# Patient Record
Sex: Female | Born: 1955 | Race: Black or African American | Hispanic: No | Marital: Single | Smoking: Never smoker
Health system: Southern US, Community
[De-identification: ages and names within clinical notes are randomized; demographics above are authoritative.]

## PROBLEM LIST (undated history)

## (undated) DIAGNOSIS — Z923 Personal history of irradiation: Secondary | ICD-10-CM

## (undated) DIAGNOSIS — M1990 Unspecified osteoarthritis, unspecified site: Secondary | ICD-10-CM

## (undated) DIAGNOSIS — Z9889 Other specified postprocedural states: Secondary | ICD-10-CM

## (undated) DIAGNOSIS — C50919 Malignant neoplasm of unspecified site of unspecified female breast: Secondary | ICD-10-CM

## (undated) DIAGNOSIS — J42 Unspecified chronic bronchitis: Secondary | ICD-10-CM

## (undated) DIAGNOSIS — Z8042 Family history of malignant neoplasm of prostate: Secondary | ICD-10-CM

## (undated) DIAGNOSIS — Z803 Family history of malignant neoplasm of breast: Secondary | ICD-10-CM

## (undated) DIAGNOSIS — K219 Gastro-esophageal reflux disease without esophagitis: Secondary | ICD-10-CM

## (undated) DIAGNOSIS — Z8041 Family history of malignant neoplasm of ovary: Secondary | ICD-10-CM

## (undated) DIAGNOSIS — R112 Nausea with vomiting, unspecified: Secondary | ICD-10-CM

## (undated) DIAGNOSIS — Z9221 Personal history of antineoplastic chemotherapy: Secondary | ICD-10-CM

## (undated) DIAGNOSIS — C801 Malignant (primary) neoplasm, unspecified: Secondary | ICD-10-CM

## (undated) DIAGNOSIS — E739 Lactose intolerance, unspecified: Secondary | ICD-10-CM

## (undated) HISTORY — DX: Family history of malignant neoplasm of prostate: Z80.42

## (undated) HISTORY — DX: Lactose intolerance, unspecified: E73.9

## (undated) HISTORY — DX: Unspecified osteoarthritis, unspecified site: M19.90

## (undated) HISTORY — DX: Malignant (primary) neoplasm, unspecified: C80.1

## (undated) HISTORY — DX: Family history of malignant neoplasm of ovary: Z80.41

## (undated) HISTORY — DX: Family history of malignant neoplasm of breast: Z80.3

---
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
## 1998-07-02 ENCOUNTER — Other Ambulatory Visit: Admission: RE | Admit: 1998-07-02 | Discharge: 1998-07-02 | Payer: Self-pay | Admitting: Gynecology

## 2000-04-29 ENCOUNTER — Other Ambulatory Visit: Admission: RE | Admit: 2000-04-29 | Discharge: 2000-04-29 | Payer: Self-pay | Admitting: Gynecology

## 2001-05-06 ENCOUNTER — Encounter: Payer: Self-pay | Admitting: Emergency Medicine

## 2001-05-06 ENCOUNTER — Emergency Department (HOSPITAL_COMMUNITY): Admission: EM | Admit: 2001-05-06 | Discharge: 2001-05-06 | Payer: Self-pay | Admitting: Emergency Medicine

## 2001-07-26 ENCOUNTER — Emergency Department (HOSPITAL_COMMUNITY): Admission: EM | Admit: 2001-07-26 | Discharge: 2001-07-26 | Payer: Self-pay | Admitting: Emergency Medicine

## 2004-01-22 ENCOUNTER — Other Ambulatory Visit: Admission: RE | Admit: 2004-01-22 | Discharge: 2004-01-22 | Payer: Self-pay | Admitting: Family Medicine

## 2004-01-24 ENCOUNTER — Ambulatory Visit (HOSPITAL_COMMUNITY): Admission: RE | Admit: 2004-01-24 | Discharge: 2004-01-24 | Payer: Self-pay | Admitting: Family Medicine

## 2004-03-24 HISTORY — PX: ROTATOR CUFF REPAIR: SHX139

## 2004-07-24 ENCOUNTER — Encounter: Admission: RE | Admit: 2004-07-24 | Discharge: 2004-07-24 | Payer: Self-pay | Admitting: Family Medicine

## 2005-02-17 ENCOUNTER — Ambulatory Visit (HOSPITAL_COMMUNITY): Admission: RE | Admit: 2005-02-17 | Discharge: 2005-02-18 | Payer: Self-pay | Admitting: Orthopedic Surgery

## 2006-02-25 ENCOUNTER — Other Ambulatory Visit: Admission: RE | Admit: 2006-02-25 | Discharge: 2006-02-25 | Payer: Self-pay | Admitting: Family Medicine

## 2008-02-14 ENCOUNTER — Other Ambulatory Visit: Admission: RE | Admit: 2008-02-14 | Discharge: 2008-02-14 | Payer: Self-pay | Admitting: Family Medicine

## 2008-03-24 HISTORY — PX: TOTAL ABDOMINAL HYSTERECTOMY: SHX209

## 2008-07-17 ENCOUNTER — Encounter (INDEPENDENT_AMBULATORY_CARE_PROVIDER_SITE_OTHER): Payer: Self-pay | Admitting: Obstetrics and Gynecology

## 2008-07-17 ENCOUNTER — Inpatient Hospital Stay (HOSPITAL_COMMUNITY): Admission: RE | Admit: 2008-07-17 | Discharge: 2008-07-19 | Payer: Self-pay | Admitting: Obstetrics and Gynecology

## 2010-04-01 ENCOUNTER — Ambulatory Visit (HOSPITAL_BASED_OUTPATIENT_CLINIC_OR_DEPARTMENT_OTHER)
Admission: RE | Admit: 2010-04-01 | Discharge: 2010-04-01 | Payer: Self-pay | Source: Home / Self Care | Attending: Obstetrics and Gynecology | Admitting: Obstetrics and Gynecology

## 2010-07-03 LAB — CBC
HCT: 34.8 % — ABNORMAL LOW (ref 36.0–46.0)
Hemoglobin: 12 g/dL (ref 12.0–15.0)
MCHC: 34.4 g/dL (ref 30.0–36.0)
MCHC: 34.4 g/dL (ref 30.0–36.0)
MCV: 94.8 fL (ref 78.0–100.0)
Platelets: 145 10*3/uL — ABNORMAL LOW (ref 150–400)
RBC: 3.7 MIL/uL — ABNORMAL LOW (ref 3.87–5.11)
RDW: 14 % (ref 11.5–15.5)

## 2010-07-03 LAB — COMPREHENSIVE METABOLIC PANEL
ALT: 29 U/L (ref 0–35)
Alkaline Phosphatase: 59 U/L (ref 39–117)
CO2: 24 mEq/L (ref 19–32)
GFR calc non Af Amer: >60 mL/min (ref >60)
Glucose, Bld: 87 mg/dL (ref 70–99)
Potassium: 3.8 mEq/L (ref 3.5–5.1)
Sodium: 133 mEq/L — ABNORMAL LOW (ref 135–145)

## 2010-07-03 LAB — TYPE AND SCREEN: ABO/RH(D): A POS

## 2010-08-06 NOTE — Op Note (Signed)
Christy Serrano, Christy Serrano              ACCOUNT NO.:  [id number]   MEDICAL RECORD NO.:  [id number]          PATIENT TYPE:  INP   LOCATION:  9399                          FACILITY:  [facility]   PHYSICIAN:  Charles A. Delcambre, MDDATE OF BIRTH:  1955/12/21   DATE OF PROCEDURE:  07/17/2008  DATE OF DISCHARGE:                               OPERATIVE REPORT   PREOPERATIVE DIAGNOSES:  1. Menorrhagia.  2. Pelvic pain.  3. 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   POSTOPERATIVE DIAGNOSES:  1. Menorrhagia.  2. Pelvic pain.  3. 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   PROCEDURE:  Transabdominal hysterectomy, bilateral salpingo-  oophorectomy.   SURGEON:  Charles A. Sydnee Cabal, MD   ASSISTANT:  Gerald Leitz, MD   ANESTHESIA:  General by the endotracheal route.   OPERATIVE FINDINGS:  22-week uterus with leiomyomata multiple, normal  ovaries.   SPECIMENS:  Uterus, tubes, and ovaries to Pathology.   ESTIMATED BLOOD LOSS:  300 mL.   COMPLICATIONS:  None.   Instrument, sponge, and needle count correct x2.   DESCRIPTION OF PROCEDURE:  The patient was taken to the operating room,  placed in supine position.  A sterile prep and drape was undertaken.  Vertical skin incision was made with a knife, carried down to fascia.  Fascia was incised with a knife and Mayo scissors.  Rectus muscles were  sharply dissected with Metzenbaum scissors and blunt dissection.  Peritoneum was entered with a hemostat without damage to the bowel,  bladder, or vascular structures.  Metzenbaum scissors were used to take  the incision superiorly and inferiorly without damage to the bladder.  Traction was used to extend the incision.  The uterus was with some  difficulty lifted out of the abdomen through this incision but was done  safely.  Kelly clamps were placed on the cornual regions.  Round  ligaments were transected bilaterally and held.  Ureters were seen  distal and away from the infundibulopelvic pedicles.  Finger was passed  through  the broad ligament.  Infundibulopelvic pedicle was isolated and  clamped, cut, free tied, and then transfixion stitched with 0 Vicryl.  Hemostasis was excellent bilaterally.  Bladder was taken down anteriorly  with Metzenbaum scissors and some blunt dissection.  The uterine vessels  were skeletonized.  Uterine vessels were taken on either side with  transfixion stitches.  Hemostasis was excellent and taken to the right  uterine artery.  The superior artery was backbleeding quite briskly.  This was separated with figure-of-eight suture with good results.  Ureter had been well seen.  infundibulopelvic pedicle was taken on the  right side then as noted above, freed, tied, and then transfixion  stitch.  Hemostasis was good.  Once bladder was down, the uterine  vessels were skeletonized.  The uterine vessels were taken and  transfixion stitched.  Hemostasis was excellent.  Second pedicle taken  from the remainder of the cardinal ligaments down either side was taken  and transfixion stitched.  The final pedicle was taken across the  uterosacral ligaments in the vaginal angle.  The uterus was cut free.  The suture was transfixion stitched.  Pedicles were  transfixion stitched  and held on either side.  Kochers were placed on all corners.  Richardson angle sutures were placed with 0 Vicryl on either side.  Zero  Vicryl was then placed running locking suture across the cuff with good  hemostasis.  Irrigation was carried out.  Minor electrocautery on the  bladder edge was done for hemostasis.  Cuff as well as all pedicles  revealed excellent hemostasis.  Irrigation was carried out once again  and all sutures and packs were removed.  Self-retaining retractor had  not been used in this procedure.  We had good exposure noted without it.  #1 PDS was used in a running nonlocking suture to close in an on bloc  fashion the abdominal incision with peritoneum and fascia.  The  subcutaneous layer was of  excellent hemostasis after minor  electrocautery.  Skin staples were then used to close the skin.  The  patient tolerated procedure well and was taken to recovery with  physician in attendance.  Uterus weighed 1248 g.      Charles A. Sydnee Cabal, MD  Electronically Signed     CAD/MEDQ  D:  07/17/2008  T:  07/18/2008  Job:  [id number]

## 2010-08-06 NOTE — Discharge Summary (Signed)
NAMECAYENNE, BREAULT              ACCOUNT NO.:  [id number]   MEDICAL RECORD NO.:  [id number]          PATIENT TYPE:  INP   LOCATION:  9320                          FACILITY:  [facility]   PHYSICIAN:  Charles A. Delcambre, MDDATE OF BIRTH:  05/09/1955   DATE OF ADMISSION:  07/17/2008  DATE OF DISCHARGE:  07/19/2008                               DISCHARGE SUMMARY   Instructions to return.   DISPOSITION:  Discharged home this day.  She has a routine course today,  has flatus and continues doing well.   She is instructed no lifting greater than 20 pounds for about 4 weeks.  No driving for 2 weeks.  Shower okay for 2 weeks.  Ambulate and stairs  are okay.  Do not drive a car for 2 weeks.  Prescription of Vicodin one  to two p.o. q.4 h. p.r.n. #40, Motrin 800 mg one p.o. q.8 h. p.r.n. #30  refill x1; and she has over-the-counter iron at home.  She is instructed  to take this once a day.  She will return to clinic in 48 hours to get  staples discontinued and see me again 2 weeks thereafter.   LABORATORY DATA:  Postoperative hematocrit 29.6, hemoglobin 10.2.  Preop  hematocrit 34.8, hemoglobin 12.0.  Pathology is pending.  Uterus, tubes,  and ovaries to Pathology consistent with approximately 22 weeks' size  uterus.   HISTORY AND PHYSICAL:  Dictated on the chart.   HOSPITAL COURSE:  The patient was admitted, underwent surgery as noted  above.  Postoperatively, she had routine postoperative course.  PCA was  discontinued postop day #1.  She was tolerating pain well with p.o.  Vicodin and Motrin.  She continued to do well and was advanced to  general diet without nausea on postop day #1, expect her to return on  postop day #2 and thereafter, the patient will be discharged home  without any complications.      Charles A. Sydnee Cabal, MD  Electronically Signed     CAD/MEDQ  D:  07/19/2008  T:  07/19/2008  Job:  [id number]

## 2010-08-09 NOTE — Op Note (Signed)
Christy Serrano, Christy Serrano              ACCOUNT NO.:  [id number]   MEDICAL RECORD NO.:  [id number]          PATIENT TYPE:  AMB   LOCATION:  DAY                          FACILITY:  [facility]   PHYSICIAN:  Marlowe Kays, M.D.  DATE OF BIRTH:  14-Nov-1955   DATE OF PROCEDURE:  02/17/2005  DATE OF DISCHARGE:                                 OPERATIVE REPORT   PREOPERATIVE DIAGNOSES:  1.  Torn retracted rotator cuff tear.  2.  Degenerative arthritis, acromioclavicular joint, right shoulder.   POSTOPERATIVE DIAGNOSES:  1.  Torn retracted rotator cuff tear.  2.  Degenerative arthritis, acromioclavicular joint, right shoulder.   OPERATION:  1.  Anterior acromionectomy and repair of torn rotator cuff.  2.  Distal clavicle excision, right shoulder.   SURGEON:  Marlowe Kays, M.D.   ASSISTANTDruscilla Brownie. Cherlynn June.   ANESTHESIA:  General.   INDICATION FOR PROCEDURE:  Painful right shoulder with MRI demonstrating  both the retracted tear and moderately severe osteoarthritis with osteophyte  at the [facility] joint. Even though her plain film looked reasonably good in this  regard, the MRI did show changes and it was felt that distal clavicle  excision was indicated. She had a significant impingement problem and this  was contributing to it as well as a downsloping lateral acromion.   PROCEDURE:  Prophylactic antibiotics, satisfactory general anesthesia, beach-  chair position with __________ frame, right shoulder girdle was prepped  DuraPrep and draped in a sterile field. I marked out an incision for the  distal clavicle excision and then curved it downward vertically in the usual  rotator cuff repair fashion. Using subperiosteal dissection, I exposed the  AC joint and measured off a centimeter and a half medially. I undermined the  clavicle at this point and placed baby Homan's underneath it and used a  micro saw to remove the distal clavicle 1.5 cm.  There were no remaining  spicules of bone  noted. Bone wax was applied to the raw bone.  I then  continued dissection lateral-ward with subperiosteal dissection exposing the  anterior acromion, which I undermined with a small Cobb elevator. Joint  fluid came forth confirming the tear. I then marked off my initial anterior  acromionectomy incision and, protecting the underlying rotator cuff with  larger Cobb elevator, made my first bone resection. She still had a  significant impingement which I corrected by making several other passes  with a micro saw and also used a small rongeur until the impingement had  been completely relieved. The rotator cuff tear was quite obvious and was at  the level of the greater tuberosity between it and the long head of biceps  tendon and measured about a centimeter and a half in width with about 2 cm  of retraction.  A portion of the rotator cuff had flipped back on itself as  well, so it was getting trapped underneath the anterior acromion.  The long  head of the biceps tendon was noted to be intact. After analyzing the tear  with the arm slightly abducted on the Mayo stand, I then began  the repair. I  started out by using two rotator cuff anchor sutures, spreading the four  wings through the tear, which basically had two halves.  With lateral  traction, I then repaired the apex of the V with multiple #1 Ethibond used  from the suture arms. I then tied down the two wings of the anchors. There  still was redundant tissue laterally which I felt would be best stabilized.  Since she had no soft tissue, I used a third anchor forming essentially an  equilateral triangle with this one at the tip and placed these sutures  weaving them through the two previous flaps and suturing these flaps down. I  then had some minimal flap remaining and I was able to use some #1 Vicryl  with some very shallow sutures through the surrounding soft tissue. This  gave a nice smooth repair. No residual impingement.  The wound  was irrigated  well with sterile saline and infiltrated with 0.5% Marcaine with adrenalin.  I placed Gelfoam in the resection space of the distal clavicle. We then  closed the wound with interrupted #1 Vicryl and the fascia over the distal  clavicle and acromion, and in the very small split in the anterior deltoid.  This gave a nice tight closure. I followed this with 2-0 Vicryl subcutaneous  tissue deep, 4-0 superficially, Steri-Strips in the skin.  A dry sterile  dressing and short immobilizer were applied. She tolerated the procedure  well at the time of this dictation and was on her way to recovery in  satisfactory condition without known complications.           ______________________________  Marlowe Kays, M.D.     JA/MEDQ  D:  02/17/2005  T:  02/17/2005  Job:  [id number]

## 2011-07-17 ENCOUNTER — Other Ambulatory Visit (HOSPITAL_BASED_OUTPATIENT_CLINIC_OR_DEPARTMENT_OTHER): Payer: Self-pay | Admitting: Obstetrics & Gynecology

## 2011-07-17 DIAGNOSIS — Z1231 Encounter for screening mammogram for malignant neoplasm of breast: Secondary | ICD-10-CM

## 2011-07-23 ENCOUNTER — Ambulatory Visit (HOSPITAL_BASED_OUTPATIENT_CLINIC_OR_DEPARTMENT_OTHER)
Admission: RE | Admit: 2011-07-23 | Discharge: 2011-07-23 | Disposition: A | Discharge status: Home / Self Care | Payer: 59 | Source: Ambulatory Visit | Attending: Obstetrics & Gynecology | Admitting: Obstetrics & Gynecology

## 2011-07-23 DIAGNOSIS — Z1231 Encounter for screening mammogram for malignant neoplasm of breast: Secondary | ICD-10-CM | POA: Insufficient documentation

## 2012-09-12 ENCOUNTER — Emergency Department (HOSPITAL_BASED_OUTPATIENT_CLINIC_OR_DEPARTMENT_OTHER)
Admission: EM | Admit: 2012-09-12 | Discharge: 2012-09-12 | Disposition: A | Discharge status: Home / Self Care | Payer: 59 | Attending: Emergency Medicine | Admitting: Emergency Medicine

## 2012-09-12 ENCOUNTER — Encounter (HOSPITAL_BASED_OUTPATIENT_CLINIC_OR_DEPARTMENT_OTHER): Payer: Self-pay | Admitting: *Deleted

## 2012-09-12 DIAGNOSIS — R220 Localized swelling, mass and lump, head: Secondary | ICD-10-CM

## 2012-09-12 DIAGNOSIS — K089 Disorder of teeth and supporting structures, unspecified: Secondary | ICD-10-CM | POA: Insufficient documentation

## 2012-09-12 MED ORDER — NAPROXEN 375 MG PO TABS: 375.0000 mg | ORAL_TABLET | Freq: Two times a day (BID) | ORAL | Status: DC

## 2012-09-12 MED ORDER — CLINDAMYCIN HCL 150 MG PO CAPS: 150.0000 mg | ORAL_CAPSULE | Freq: Four times a day (QID) | ORAL | Status: DC

## 2012-09-12 NOTE — ED Notes (Signed)
Had a tooth filling done a few days ago and now her face, right side, is swollen and sore.

## 2012-09-12 NOTE — ED Provider Notes (Signed)
History     CSN: [id number]  Arrival date & time 09/12/12  1059   First MD Initiated Contact with Patient 09/12/12 1214      Chief Complaint  Patient presents with  . Facial Swelling    (Consider location/radiation/quality/duration/timing/severity/associated sxs/prior treatment) Patient is a 57 y.o. female presenting with tooth pain. The history is provided by the patient. No language interpreter was used.  Dental Pain Location:  Upper Quality:  Aching Severity:  Moderate Onset quality:  Gradual Timing:  Constant Progression:  Worsening Chronicity:  New Relieved by:  Nothing Ineffective treatments:  None tried Pt complains of swelling to her upper face.  Pt had a tooth filled on wednesday  History reviewed. No pertinent past medical history.  History reviewed. No pertinent past surgical history.  No family history on file.  History  Substance Use Topics  . Smoking status: Never Smoker   . Smokeless tobacco: Not on file  . Alcohol Use: No    OB History   Grav Para Term Preterm Abortions TAB SAB Ect Mult Living                  Review of Systems  All other systems reviewed and are negative.    Allergies  Review of patient's allergies indicates not on file.  Home Medications   Current Outpatient Rx  Name  Route  Sig  Dispense  Refill  . estradiol (VIVELLE-DOT) 0.025 MG/24HR   Transdermal   Place 1 patch onto the skin 2 (two) times a week.         . naproxen (NAPROSYN) 250 MG tablet   Oral   Take 250 mg by mouth 2 (two) times daily with a meal.           BP 132/72  Pulse 78  Temp(Src) 98.6 F (37 C) (Oral)  Resp 18  Wt 187 lb (84.823 kg)  SpO2 99%  Physical Exam  Nursing note and vitals reviewed. Constitutional: She is oriented to person, place, and time. She appears well-developed.  HENT:  Head: Normocephalic and atraumatic.  Mouth/Throat: Oropharynx is clear and moist.  Filling no gum swelling,   Slight swelling face  Eyes:  Conjunctivae are normal. Pupils are equal, round, and reactive to light.  Musculoskeletal: Normal range of motion.  Neurological: She is alert and oriented to person, place, and time. She has normal reflexes.  Skin: Skin is warm.  Psychiatric: She has a normal mood and affect.    ED Course  Procedures (including critical care time)  Labs Reviewed - No data to display No results found.   1. Right facial swelling       MDM  I will treat with clindamycin and naprosyn.   Pt advised to follow up with her dentist for recheck.   Call tomorrow for appointment       Elson Areas, PA-C 09/12/12 2155

## 2012-09-13 NOTE — ED Provider Notes (Signed)
Medical screening examination/treatment/procedure(s) were performed by non-physician practitioner and as supervising physician I was immediately available for consultation/collaboration.   Sherryll Skoczylas Y. Adeena Bernabe, MD 09/13/12 0854 

## 2014-06-22 ENCOUNTER — Other Ambulatory Visit (HOSPITAL_BASED_OUTPATIENT_CLINIC_OR_DEPARTMENT_OTHER): Payer: Self-pay | Admitting: Obstetrics and Gynecology

## 2014-06-22 DIAGNOSIS — Z1231 Encounter for screening mammogram for malignant neoplasm of breast: Secondary | ICD-10-CM

## 2014-07-10 ENCOUNTER — Ambulatory Visit (HOSPITAL_BASED_OUTPATIENT_CLINIC_OR_DEPARTMENT_OTHER)
Admission: RE | Admit: 2014-07-10 | Discharge: 2014-07-10 | Disposition: A | Discharge status: Home / Self Care | Payer: 59 | Source: Ambulatory Visit | Attending: Obstetrics and Gynecology | Admitting: Obstetrics and Gynecology

## 2014-07-10 DIAGNOSIS — Z1231 Encounter for screening mammogram for malignant neoplasm of breast: Secondary | ICD-10-CM | POA: Diagnosis present

## 2015-06-01 MED FILL — METHYLPREDNISOLONE 4 MG TAB: 4 | 6 days supply | Qty: 21 | Fill #0

## 2015-06-28 MED FILL — FLUCONAZOLE 200 MG TABLET: 200 | 14 days supply | Qty: 7 | Fill #0

## 2015-07-24 ENCOUNTER — Other Ambulatory Visit (HOSPITAL_BASED_OUTPATIENT_CLINIC_OR_DEPARTMENT_OTHER): Payer: Self-pay | Admitting: Obstetrics and Gynecology

## 2015-07-24 DIAGNOSIS — Z1231 Encounter for screening mammogram for malignant neoplasm of breast: Secondary | ICD-10-CM

## 2015-07-26 ENCOUNTER — Ambulatory Visit (HOSPITAL_BASED_OUTPATIENT_CLINIC_OR_DEPARTMENT_OTHER)
Admission: RE | Admit: 2015-07-26 | Discharge: 2015-07-26 | Disposition: A | Discharge status: Home / Self Care | Payer: 59 | Source: Ambulatory Visit | Attending: Obstetrics and Gynecology | Admitting: Obstetrics and Gynecology

## 2015-07-26 DIAGNOSIS — Z1231 Encounter for screening mammogram for malignant neoplasm of breast: Secondary | ICD-10-CM | POA: Diagnosis not present

## 2015-10-30 MED FILL — VIVELLE-DOT 0.075 MG PATCH: 0.075 | 28 days supply | Qty: 8 | Fill #0

## 2015-11-29 MED FILL — VIVELLE-DOT 0.075 MG PATCH: 0.075 | 28 days supply | Qty: 8 | Fill #1

## 2015-12-28 MED FILL — VIVELLE-DOT 0.075 MG PATCH: 0.075 | 28 days supply | Qty: 8 | Fill #2

## 2016-01-25 MED FILL — VIVELLE-DOT 0.075 MG PATCH: 0.075 | 28 days supply | Qty: 8 | Fill #0

## 2016-02-01 MED FILL — BENZONATATE 100 MG CAPSULE: 100 | 5 days supply | Qty: 30 | Fill #0

## 2016-02-13 MED FILL — CEFDINIR 300 MG CAPSULE: 300 | 14 days supply | Qty: 14 | Fill #0

## 2016-02-26 MED FILL — VENTOLIN HFA 90 MCG INHALER: 108 (90 BAS | 17 days supply | Qty: 18 | Fill #0

## 2016-05-23 MED FILL — ESTRADIOL 0.075 MG PATCH: 0.075 | 28 days supply | Qty: 8 | Fill #0

## 2016-06-23 MED FILL — METHYLPREDNISOLONE 4 MG TAB: 4 | 6 days supply | Qty: 21 | Fill #0

## 2016-08-22 MED FILL — PROCTO-MED HC 2.5% CREAM: 2.5 | 20 days supply | Qty: 30 | Fill #0

## 2016-10-02 ENCOUNTER — Other Ambulatory Visit (HOSPITAL_BASED_OUTPATIENT_CLINIC_OR_DEPARTMENT_OTHER): Payer: Self-pay | Admitting: Internal Medicine

## 2016-10-02 DIAGNOSIS — Z1231 Encounter for screening mammogram for malignant neoplasm of breast: Secondary | ICD-10-CM

## 2016-10-24 MED FILL — FLUCONAZOLE 150 MG TABLET: 150 | 3 days supply | Qty: 3 | Fill #0

## 2016-10-24 MED FILL — METHYLPREDNISOLONE 4 MG TAB: 4 | 6 days supply | Qty: 21 | Fill #0

## 2016-11-04 ENCOUNTER — Other Ambulatory Visit (HOSPITAL_BASED_OUTPATIENT_CLINIC_OR_DEPARTMENT_OTHER): Payer: Self-pay | Admitting: Internal Medicine

## 2016-11-04 DIAGNOSIS — Z0000 Encounter for general adult medical examination without abnormal findings: Secondary | ICD-10-CM

## 2016-11-27 ENCOUNTER — Ambulatory Visit (HOSPITAL_BASED_OUTPATIENT_CLINIC_OR_DEPARTMENT_OTHER)
Admission: RE | Admit: 2016-11-27 | Discharge: 2016-11-27 | Disposition: A | Discharge status: Home / Self Care | Payer: 59 | Source: Ambulatory Visit | Attending: Internal Medicine | Admitting: Internal Medicine

## 2016-11-27 DIAGNOSIS — Z0000 Encounter for general adult medical examination without abnormal findings: Secondary | ICD-10-CM

## 2016-11-27 DIAGNOSIS — Z1231 Encounter for screening mammogram for malignant neoplasm of breast: Secondary | ICD-10-CM | POA: Insufficient documentation

## 2017-02-10 MED FILL — metroNIDAZOLE 0.75 % GEL: 0.75 | 5 days supply | Qty: 70 | Fill #0

## 2017-03-20 MED FILL — metroNIDAZOLE 0.75 % GEL: 0.75 | 5 days supply | Qty: 70 | Fill #0

## 2017-11-04 MED FILL — CYCLOBENZAPRINE HCL 10 MG T: 10 | 7 days supply | Qty: 7 | Fill #0

## 2017-11-12 MED FILL — VENTOLIN HFA 90 MCG INHALER: 108 (90 BAS | 17 days supply | Qty: 18 | Fill #0

## 2017-11-12 MED FILL — AZITHROMYCIN 250 MG TABLET: 250 | 5 days supply | Qty: 6 | Fill #0

## 2017-12-08 ENCOUNTER — Encounter (INDEPENDENT_AMBULATORY_CARE_PROVIDER_SITE_OTHER): Payer: Self-pay

## 2017-12-18 ENCOUNTER — Other Ambulatory Visit (HOSPITAL_BASED_OUTPATIENT_CLINIC_OR_DEPARTMENT_OTHER): Payer: Self-pay | Admitting: Obstetrics and Gynecology

## 2017-12-18 DIAGNOSIS — Z1231 Encounter for screening mammogram for malignant neoplasm of breast: Secondary | ICD-10-CM

## 2017-12-22 ENCOUNTER — Encounter (INDEPENDENT_AMBULATORY_CARE_PROVIDER_SITE_OTHER): Payer: Self-pay | Admitting: Bariatrics

## 2017-12-22 ENCOUNTER — Ambulatory Visit (INDEPENDENT_AMBULATORY_CARE_PROVIDER_SITE_OTHER): Payer: Managed Care, Other (non HMO) | Admitting: Bariatrics

## 2017-12-22 VITALS — BP 134/75 | HR 61 | Temp 97.6°F | Ht 65.0 in | Wt 195.0 lb

## 2017-12-22 DIAGNOSIS — Z9189 Other specified personal risk factors, not elsewhere classified: Secondary | ICD-10-CM

## 2017-12-22 DIAGNOSIS — Z6832 Body mass index (BMI) 32.0-32.9, adult: Secondary | ICD-10-CM

## 2017-12-22 DIAGNOSIS — E559 Vitamin D deficiency, unspecified: Secondary | ICD-10-CM | POA: Diagnosis not present

## 2017-12-22 DIAGNOSIS — Z0289 Encounter for other administrative examinations: Secondary | ICD-10-CM

## 2017-12-22 DIAGNOSIS — Z1331 Encounter for screening for depression: Secondary | ICD-10-CM

## 2017-12-22 DIAGNOSIS — J45909 Unspecified asthma, uncomplicated: Secondary | ICD-10-CM | POA: Diagnosis not present

## 2017-12-22 DIAGNOSIS — R5383 Other fatigue: Secondary | ICD-10-CM

## 2017-12-22 DIAGNOSIS — E669 Obesity, unspecified: Secondary | ICD-10-CM

## 2017-12-22 NOTE — Progress Notes (Signed)
.  Office: [phone]  /  Fax: [phone]   HPI:   Chief Complaint: OBESITY  Christy Serrano (MR# [id number]) is a 62 y.o. female who presents on 12/22/2017 for obesity evaluation and treatment. Current BMI is Body mass index is 32.45 kg/m.Christy Serrano has struggled with obesity for years and has been unsuccessful in either losing weight or maintaining long term weight loss. Christy Serrano has occasional increased portion size and she increases her carbohydrates at times. Christy Serrano attended our information session and states she is currently in the action stage of change and ready to dedicate time achieving and maintaining a healthier weight.  Christy Serrano states her family eats meals together she thinks her family will eat healthier with  her desired weight loss is 36 to 41 lbs she started gaining weight at menopause her heaviest weight ever was 196 lbs. she has significant food cravings issues  she snacks frequently in the evenings she skips meals frequently she is frequently drinking liquids with calories she frequently makes poor food choices she frequently eats larger portions than normal  she struggles with emotional eating    Fatigue Christy Serrano feels her energy is lower than it should be. This has worsened with weight gain and has not worsened recently. Matalie admits to daytime somnolence and admits to waking up still tired. Patient is at risk for obstructive sleep apnea. Patent has a history of symptoms of daytime fatigue and morning fatigue. Patient generally gets 6 hours of sleep per night, and states they generally have restful sleep. Snoring is present. Apneic episodes are not present. Epworth Sleepiness Score is 9  Dyspnea on exertion Christy Serrano notes increasing shortness of breath with exercising and seems to be worsening over time with weight gain. She notes getting out of breath sooner with activity than she used to. This has not gotten worse recently. Christy Serrano denies  orthopnea.  Vitamin D deficiency Christy Serrano has a diagnosis of vitamin D deficiency. She is currently taking OTC vit D and denies nausea, vomiting or muscle weakness.  At risk for osteopenia and osteoporosis Christy Serrano is at higher risk of osteopenia and osteoporosis due to vitamin D deficiency.   Reactive Airway Disease Christy Serrano has a diagnosis of reactive airway disease and she uses ventolin occasionally with the common cold and episodes of bronchitis.  Depression Screen Christy Serrano (modified PHQ-9) score was  Depression screen PHQ 2/9 12/22/2017  Decreased Interest 0  Down, Depressed, Hopeless 0  PHQ - 2 Score 0  Altered sleeping 0  Tired, decreased energy 0  Change in appetite 1  Feeling bad or failure about yourself  0  Trouble concentrating 0  Moving slowly or fidgety/restless 0  Suicidal thoughts 0  PHQ-9 Score 1    ALLERGIES: Not on File  MEDICATIONS: Current Outpatient Medications on File Prior to Visit  Medication Sig Dispense Refill  . albuterol (PROVENTIL HFA;VENTOLIN HFA) 108 (90 Base) MCG/ACT inhaler Inhale into the lungs every 6 (six) hours as needed for wheezing or shortness of breath.    Marland Kitchen aspirin 81 MG chewable tablet Chew by mouth daily.    . Calcium-Phosphorus-Vitamin D (CITRACAL +D3 PO) Take 1,200 mcg by mouth.    . cholecalciferol (VITAMIN D) 1000 units tablet Take 1,000 Units by mouth daily.    Marland Kitchen estradiol (VIVELLE-DOT) 0.025 MG/24HR Place 1 patch onto the skin 2 (two) times a week.    Marland Kitchen ibuprofen (ADVIL,MOTRIN) 200 MG tablet Take 200 mg by mouth every 6 (six) hours as needed.    Marland Kitchen  Multiple Vitamin (MULTIVITAMIN) capsule Take 1 capsule by mouth daily.     No current facility-administered medications on file prior to visit.     PAST MEDICAL HISTORY: Past Medical History:  Diagnosis Date  . Lactose intolerance     PAST SURGICAL HISTORY: Past Surgical History:  Procedure Laterality Date  . HYSTEROTOMY    . ROTATOR CUFF REPAIR       SOCIAL HISTORY: Social History   Tobacco Use  . Smoking status: Never Smoker  . Smokeless tobacco: Never Used  Substance Use Topics  . Alcohol use: No  . Drug use: No    FAMILY HISTORY: Family History  Problem Relation Age of Onset  . High blood pressure Mother   . High blood pressure Father   . Cancer Father     ROS: Review of Systems  Constitutional: Positive for malaise/fatigue.  HENT: Positive for congestion (nasal stuffiness) and tinnitus.   Eyes:       + Wear Glasses + Floaters  Respiratory: Positive for shortness of breath (on exertion).   Cardiovascular: Negative for orthopnea.  Gastrointestinal: Negative for nausea and vomiting.  Musculoskeletal:       Negative for muscle weakness    PHYSICAL EXAM: Blood pressure 134/75, pulse 61, temperature 97.6 F (36.4 C), temperature source Oral, height 5' 5"  (1.651 m), weight 195 lb (88.5 kg), SpO2 100 %. Body mass index is 32.45 kg/m. Physical Exam  Constitutional: She is oriented to person, place, and time. She appears well-developed and well-nourished.  HENT:  Head: Normocephalic and atraumatic.  Nose: Nose normal.  Eyes: EOM are normal. No scleral icterus.  Neck: Normal range of motion. Neck supple. No thyromegaly present.  Cardiovascular: Normal rate and regular rhythm.  Pulmonary/Chest: Effort normal. No respiratory distress.  Abdominal: Soft. There is no tenderness.  + Obesity  Musculoskeletal: Normal range of motion.  Range of Motion normal in all 4 extremities   Neurological: She is alert and oriented to person, place, and time. Coordination normal.  Skin: Skin is warm and dry.  Psychiatric: She has a normal Serrano and affect. Her behavior is normal.  Vitals reviewed.   RECENT LABS AND TESTS: BMET    Component Value Date/Time   NA 133 (L) 07/17/2008 0811   K 3.8 07/17/2008 0811   CL 101 07/17/2008 0811   CO2 24 07/17/2008 0811   GLUCOSE 87 07/17/2008 0811   BUN 11 07/17/2008 0811    CREATININE 0.82 07/17/2008 0811   CALCIUM 8.9 07/17/2008 0811   GFRNONAA >60 07/17/2008 0811   GFRAA  07/17/2008 0811    >60        The eGFR has been calculated using the MDRD equation. This calculation has not been validated in all clinical situations. eGFR's persistently <60 mL/min signify possible Chronic Kidney Disease.   No results found for: HGBA1C No results found for: INSULIN CBC    Component Value Date/Time   WBC 10.1 07/18/2008 0525   RBC 3.13 (L) 07/18/2008 0525   HGB 10.2 (L) 07/18/2008 0525   HCT 29.6 (L) 07/18/2008 0525   PLT 145 (L) 07/18/2008 0525   MCV 94.8 07/18/2008 0525   MCHC 34.4 07/18/2008 0525   RDW 14.0 07/18/2008 0525   Iron/TIBC/Ferritin/ %Sat No results found for: IRON, TIBC, FERRITIN, IRONPCTSAT Lipid Panel  No results found for: CHOL, TRIG, HDL, CHOLHDL, VLDL, LDLCALC, LDLDIRECT Hepatic Function Panel     Component Value Date/Time   PROT 7.2 07/17/2008 0811   ALBUMIN 4.0 07/17/2008 0811  AST 31 07/17/2008 0811   ALT 29 07/17/2008 0811   ALKPHOS 59 07/17/2008 0811   BILITOT 0.8 07/17/2008 0811   No results found for: TSH   Vitamin D There are no recent lab results  ECG  shows NSR with a rate of 71 BPM INDIRECT CALORIMETER done today shows a VO2 of 260 and a REE of 1812. Her calculated basal metabolic rate is 1478 thus her basal metabolic rate is better than expected.    ASSESSMENT AND PLAN: Other fatigue - Plan: EKG 12-Lead, Comprehensive metabolic panel, Hemoglobin A1c, Insulin, random, Lipid Panel With LDL/HDL Ratio, T3, T4, free, TSH, CBC With Differential, Vitamin B12, Folate  Reactive airway disease without complication, unspecified asthma severity, unspecified whether persistent  Vitamin D deficiency - Plan: VITAMIN D 25 Hydroxy (Vit-D Deficiency, Fractures)  Depression screening  At risk for osteoporosis  Class 1 obesity with serious comorbidity and body mass index (BMI) of 32.0 to 32.9 in adult, unspecified obesity  type  PLAN:  Fatigue Zarayah was informed that her fatigue may be related to obesity, depression or many other causes. Labs will be ordered, and in the meanwhile Ardell has agreed to work on diet, exercise and weight loss to help with fatigue. Proper sleep hygiene was discussed including the need for 7-8 hours of quality sleep each night. A sleep study was not ordered based on symptoms and Epworth score.  Dyspnea on exertion Tashianna's shortness of breath appears to be obesity related and exercise induced. She has agreed to work on weight loss and gradually increase exercise to treat her exercise induced shortness of breath. If Dody follows our instructions and loses weight without improvement of her shortness of breath, we will plan to refer to pulmonology. We will monitor this condition regularly. Christy Serrano agrees to this plan.  Vitamin D Deficiency Christy Serrano was informed that low vitamin D levels contributes to fatigue and are associated with obesity, breast, and colon cancer. She agrees to continue to take OTC Vit D '@1'$ ,000 IU daily and will follow up for routine testing of vitamin D, at least 2-3 times per year. She was informed of the risk of over-replacement of vitamin D and agrees to not increase her dose unless she discusses this with [place] first. We will check vitamin D level today and Christy Serrano will follow up as directed.  At risk for osteopenia and osteoporosis Christy Serrano was given extended  (15 minutes) osteoporosis prevention counseling today. Christy Serrano is at risk for osteopenia and osteoporosis due to her vitamin D deficiency. She was encouraged to take her vitamin D and follow her higher calcium diet and increase strengthening exercise to help strengthen her bones and decrease her risk of osteopenia and osteoporosis.  Reactive Airway Disease Christy Serrano will continue to use her inhaler as needed. She will begin exercise slowly and will increase over time. Brittnae will follow up with our  clinic in 2 weeks.  Depression Screen Christy Serrano had a negative depression screening. Depression is commonly associated with obesity and often results in emotional eating behaviors. We will monitor this closely and work on CBT to help improve the non-hunger eating patterns. Referral to Psychology may be required if no improvement is seen as she continues in our clinic.  Obesity Christy Serrano is currently in the action stage of change and her goal is to continue with weight loss efforts She has agreed to follow the Category 3 plan Christy Serrano has been instructed to work up to a goal of 150 minutes of combined cardio and strengthening  exercise per week for weight loss and overall health benefits. We discussed the following Behavioral Modification Strategies today: increase H2O intake, no skipping meals, keeping healthy foods in the home, increasing lean protein intake, decreasing simple carbohydrates , increasing vegetables and work on meal planning and easy cooking plans  Yareli will work on decreasing her portion size and she will work on [employer].  Kaetlyn has agreed to follow up with our clinic in 2 weeks. She was informed of the importance of frequent follow up visits to maximize her success with intensive lifestyle modifications for her multiple health conditions. She was informed we would discuss her lab results at her next visit unless there is a critical issue that needs to be addressed sooner. Meiah agreed to keep her next visit at the agreed upon time to discuss these results.    OBESITY BEHAVIORAL INTERVENTION VISIT  Today's visit was # 1   Starting weight: 195 lbs Starting date: 12/22/17 Today's weight : 195 lbs Today's date: 12/22/2017 Total lbs lost to date: 0   ASK: We discussed the diagnosis of obesity with Mackie Pai today and Christy Serrano agreed to give [place] permission to discuss obesity behavioral modification therapy today.  ASSESS: Leiah has the diagnosis  of obesity and her BMI today is 32.45 Raissa is in the action stage of change   ADVISE: Mykeisha was educated on the multiple health risks of obesity as well as the benefit of weight loss to improve her health. She was advised of the need for long term treatment and the importance of lifestyle modifications to improve her current health and to decrease her risk of future health problems.  AGREE: Multiple dietary modification options and treatment options were discussed and  Lida agreed to follow the recommendations documented in the above note.  ARRANGE: Baljit was educated on the importance of frequent visits to treat obesity as outlined per CMS and USPSTF guidelines and agreed to schedule her next follow up appointment today.   Corey Skains, am acting as Location manager for [employer]. Owens Shark, DO  I have reviewed the above documentation for accuracy and completeness, and I agree with the above. -Jearld Lesch, DO

## 2017-12-23 LAB — COMPREHENSIVE METABOLIC PANEL
ALK PHOS: 67 IU/L (ref 39–117)
ALT: 12 IU/L (ref 0–32)
AST: 18 IU/L (ref 0–40)
Albumin/Globulin Ratio: 1.7 (ref 1.2–2.2)
Albumin: 4.8 g/dL (ref 3.6–4.8)
BILIRUBIN TOTAL: 0.7 mg/dL (ref 0.0–1.2)
BUN/Creatinine Ratio: 13 (ref 12–28)
BUN: 12 mg/dL (ref 8–27)
CHLORIDE: 99 mmol/L (ref 96–106)
CO2: 24 mmol/L (ref 20–29)
CREATININE: 0.92 mg/dL (ref 0.57–1.00)
Calcium: 9.1 mg/dL (ref 8.7–10.3)
GFR calc Af Amer: 78 mL/min/{1.73_m2} (ref >59)
GFR calc non Af Amer: 68 mL/min/{1.73_m2} (ref >59)
GLOBULIN, TOTAL: 2.8 g/dL (ref 1.5–4.5)
Glucose: 81 mg/dL (ref 65–99)
POTASSIUM: 4.2 mmol/L (ref 3.5–5.2)
SODIUM: 138 mmol/L (ref 134–144)
Total Protein: 7.6 g/dL (ref 6.0–8.5)

## 2017-12-23 LAB — CBC WITH DIFFERENTIAL
BASOS ABS: 0 10*3/uL (ref 0.0–0.2)
Basos: 0 %
EOS (ABSOLUTE): 0 10*3/uL (ref 0.0–0.4)
Eos: 1 %
Hematocrit: 38.2 % (ref 34.0–46.6)
Hemoglobin: 12.3 g/dL (ref 11.1–15.9)
IMMATURE GRANULOCYTES: 0 %
Immature Grans (Abs): 0 10*3/uL (ref 0.0–0.1)
Lymphocytes Absolute: 2 10*3/uL (ref 0.7–3.1)
Lymphs: 38 %
MCH: 29.6 pg (ref 26.6–33.0)
MCHC: 32.2 g/dL (ref 31.5–35.7)
MCV: 92 fL (ref 79–97)
MONOS ABS: 0.3 10*3/uL (ref 0.1–0.9)
Monocytes: 6 %
Neutrophils Absolute: 2.9 10*3/uL (ref 1.4–7.0)
Neutrophils: 55 %
RBC: 4.15 x10E6/uL (ref 3.77–5.28)
RDW: 14.8 % (ref 12.3–15.4)
WBC: 5.3 10*3/uL (ref 3.4–10.8)

## 2017-12-23 LAB — TSH: TSH: 1.93 u[IU]/mL (ref 0.450–4.500)

## 2017-12-23 LAB — LIPID PANEL WITH LDL/HDL RATIO
Cholesterol, Total: 176 mg/dL (ref 100–199)
HDL: 61 mg/dL (ref >39)
LDL Calculated: 93 mg/dL (ref 0–99)
LDl/HDL Ratio: 1.5 ratio (ref 0.0–3.2)
TRIGLYCERIDES: 111 mg/dL (ref 0–149)
VLDL Cholesterol Cal: 22 mg/dL (ref 5–40)

## 2017-12-23 LAB — HEMOGLOBIN A1C
ESTIMATED AVERAGE GLUCOSE: 128 mg/dL
HEMOGLOBIN A1C: 6.1 % — AB (ref 4.8–5.6)

## 2017-12-23 LAB — VITAMIN D 25 HYDROXY (VIT D DEFICIENCY, FRACTURES): VIT D 25 HYDROXY: 41.9 ng/mL (ref 30.0–100.0)

## 2017-12-23 LAB — VITAMIN B12: VITAMIN B 12: 711 pg/mL (ref 232–1245)

## 2017-12-23 LAB — FOLATE: FOLATE: 19.4 ng/mL (ref >3.0)

## 2017-12-23 LAB — INSULIN, RANDOM: INSULIN: 13.7 u[IU]/mL (ref 2.6–24.9)

## 2017-12-23 LAB — T4, FREE: FREE T4: 1.12 ng/dL (ref 0.82–1.77)

## 2017-12-23 LAB — T3: T3 TOTAL: 133 ng/dL (ref 71–180)

## 2017-12-31 ENCOUNTER — Ambulatory Visit (HOSPITAL_BASED_OUTPATIENT_CLINIC_OR_DEPARTMENT_OTHER)
Admission: RE | Admit: 2017-12-31 | Discharge: 2017-12-31 | Disposition: A | Discharge status: Home / Self Care | Payer: Managed Care, Other (non HMO) | Source: Ambulatory Visit | Attending: Obstetrics and Gynecology | Admitting: Obstetrics and Gynecology

## 2017-12-31 DIAGNOSIS — Z1231 Encounter for screening mammogram for malignant neoplasm of breast: Secondary | ICD-10-CM

## 2018-01-04 ENCOUNTER — Other Ambulatory Visit: Payer: Self-pay | Admitting: Obstetrics and Gynecology

## 2018-01-04 DIAGNOSIS — R928 Other abnormal and inconclusive findings on diagnostic imaging of breast: Secondary | ICD-10-CM

## 2018-01-05 ENCOUNTER — Ambulatory Visit (INDEPENDENT_AMBULATORY_CARE_PROVIDER_SITE_OTHER): Payer: Managed Care, Other (non HMO) | Admitting: Bariatrics

## 2018-01-05 VITALS — BP 132/77 | HR 65 | Temp 97.8°F | Ht 65.0 in | Wt 191.0 lb

## 2018-01-05 DIAGNOSIS — Z6831 Body mass index (BMI) 31.0-31.9, adult: Secondary | ICD-10-CM

## 2018-01-05 DIAGNOSIS — R7303 Prediabetes: Secondary | ICD-10-CM | POA: Diagnosis not present

## 2018-01-05 DIAGNOSIS — E669 Obesity, unspecified: Secondary | ICD-10-CM | POA: Diagnosis not present

## 2018-01-05 DIAGNOSIS — E559 Vitamin D deficiency, unspecified: Secondary | ICD-10-CM

## 2018-01-06 DIAGNOSIS — R7303 Prediabetes: Secondary | ICD-10-CM | POA: Insufficient documentation

## 2018-01-06 NOTE — Progress Notes (Signed)
Office: [phone]  /  Fax: [phone]   HPI:   Chief Complaint: OBESITY Christy Serrano is here to discuss her progress with her obesity treatment plan. She is on the Category 3 plan and is following her eating plan approximately 100 % of the time. She states she is exercising 0 minutes 0 times per week. Christy Serrano states it was hard to get all food in, especially in the evening. Her hunger is controlled, no significant cravings, and using snacks appropriately. Her weight is 191 lb (86.6 kg) today and has had a weight loss of 4 pounds over a period of 2 weeks since her last visit. She has lost 2 lbs since starting treatment with [place].  Vitamin Serrano Deficiency Christy Serrano has a diagnosis of vitamin Serrano deficiency. She is currently taking OTC Vit Serrano, last level was 41.9. She denies nausea, vomiting or muscle weakness.  Pre-Diabetes Christy Serrano has a diagnosis of pre-diabetes based on her elevated Hgb A1c and was informed this puts her at greater risk of developing diabetes. Last A1c was 6.1 and insulin was 13.7. She is not taking metformin currently and continues to work on diet and exercise to decrease risk of diabetes. She denies polyphagia or hypoglycemia.  ALLERGIES: Not on File  MEDICATIONS: Current Outpatient Medications on File Prior to Visit  Medication Sig Dispense Refill  . albuterol (PROVENTIL HFA;VENTOLIN HFA) 108 (90 Base) MCG/ACT inhaler Inhale into the lungs every 6 (six) hours as needed for wheezing or shortness of breath.    Marland Kitchen aspirin 81 MG chewable tablet Chew by mouth daily.    . Calcium-Phosphorus-Vitamin Serrano (CITRACAL +D3 PO) Take 1,200 mcg by mouth.    . cholecalciferol (VITAMIN Serrano) 1000 units tablet Take 1,000 Units by mouth daily.    Marland Kitchen estradiol (VIVELLE-DOT) 0.025 MG/24HR Place 1 patch onto the skin 2 (two) times a week.    Marland Kitchen ibuprofen (ADVIL,MOTRIN) 200 MG tablet Take 200 mg by mouth every 6 (six) hours as needed.    . Multiple Vitamin (MULTIVITAMIN) capsule Take 1 capsule by mouth  daily.     No current facility-administered medications on file prior to visit.     PAST MEDICAL HISTORY: Past Medical History:  Diagnosis Date  . Lactose intolerance     PAST SURGICAL HISTORY: Past Surgical History:  Procedure Laterality Date  . HYSTEROTOMY    . ROTATOR CUFF REPAIR      SOCIAL HISTORY: Social History   Tobacco Use  . Smoking status: Never Smoker  . Smokeless tobacco: Never Used  Substance Use Topics  . Alcohol use: No  . Drug use: No    FAMILY HISTORY: Family History  Problem Relation Age of Onset  . High blood pressure Mother   . High blood pressure Father   . Cancer Father     ROS: Review of Systems  Constitutional: Positive for weight loss.  Gastrointestinal: Negative for nausea and vomiting.  Musculoskeletal:       Negative muscle weakness  Endo/Heme/Allergies:       Negative polyphagia Negative hypoglycemia    PHYSICAL EXAM: Blood pressure 132/77, pulse 65, temperature 97.8 F (36.6 C), temperature source Oral, height 5\' 5"  (1.651 m), weight 191 lb (86.6 kg), SpO2 99 %. Body mass index is 31.78 kg/m. Physical Exam  Constitutional: She is oriented to person, place, and time. She appears well-developed and well-nourished.  Cardiovascular: Normal rate.  Pulmonary/Chest: Effort normal.  Musculoskeletal: Normal range of motion.  Neurological: She is oriented to person, place, and time.  Skin:  Skin is warm and dry.  Psychiatric: She has a normal mood and affect. Her behavior is normal.  Vitals reviewed.   RECENT LABS AND TESTS: BMET    Component Value Date/Time   NA 138 12/22/2017 1235   K 4.2 12/22/2017 1235   CL 99 12/22/2017 1235   CO2 24 12/22/2017 1235   GLUCOSE 81 12/22/2017 1235   GLUCOSE 87 07/17/2008 0811   BUN 12 12/22/2017 1235   CREATININE 0.92 12/22/2017 1235   CALCIUM 9.1 12/22/2017 1235   GFRNONAA 68 12/22/2017 1235   GFRAA 78 12/22/2017 1235   Lab Results  Component Value Date   HGBA1C 6.1 (H)  12/22/2017   Lab Results  Component Value Date   INSULIN 13.7 12/22/2017   CBC    Component Value Date/Time   WBC 5.3 12/22/2017 1235   WBC 10.1 07/18/2008 0525   RBC 4.15 12/22/2017 1235   RBC 3.13 (L) 07/18/2008 0525   HGB 12.3 12/22/2017 1235   HCT 38.2 12/22/2017 1235   PLT 145 (L) 07/18/2008 0525   MCV 92 12/22/2017 1235   MCH 29.6 12/22/2017 1235   MCHC 32.2 12/22/2017 1235   MCHC 34.4 07/18/2008 0525   RDW 14.8 12/22/2017 1235   LYMPHSABS 2.0 12/22/2017 1235   EOSABS 0.0 12/22/2017 1235   BASOSABS 0.0 12/22/2017 1235   Iron/TIBC/Ferritin/ %Sat No results found for: IRON, TIBC, FERRITIN, IRONPCTSAT Lipid Panel     Component Value Date/Time   CHOL 176 12/22/2017 1235   TRIG 111 12/22/2017 1235   HDL 61 12/22/2017 1235   LDLCALC 93 12/22/2017 1235   Hepatic Function Panel     Component Value Date/Time   PROT 7.6 12/22/2017 1235   ALBUMIN 4.8 12/22/2017 1235   AST 18 12/22/2017 1235   ALT 12 12/22/2017 1235   ALKPHOS 67 12/22/2017 1235   BILITOT 0.7 12/22/2017 1235      Component Value Date/Time   TSH 1.930 12/22/2017 1235  Results for Christy Serrano (MRN [id number]) as of 01/06/2018 10:06  Ref. Range 12/22/2017 12:35  Vitamin Serrano, 25-Hydroxy Latest Ref Range: 30.0 - 100.0 ng/mL 41.9    ASSESSMENT AND PLAN: Vitamin Serrano deficiency  Prediabetes  Class 1 obesity with serious comorbidity and body mass index (BMI) of 31.0 to 31.9 in adult, unspecified obesity type  PLAN:  Vitamin Serrano Deficiency Christy Serrano was informed that low vitamin Serrano levels contributes to fatigue and are associated with obesity, breast, and colon cancer. Christy Serrano agrees to continue taking OTC Vit Serrano and will follow up for routine testing of vitamin Serrano, at least 2-3 times per year. She was informed of the risk of over-replacement of vitamin Serrano and agrees to not increase her dose unless she discusses this with [place] first. Christy Serrano agrees to follow up with our clinic in 2  weeks.  Pre-Diabetes Christy Serrano will continue to work on weight loss, exercise, and decreasing simple carbohydrates in her diet to help decrease the risk of diabetes. We dicussed metformin including benefits and risks. She was informed that eating too many simple carbohydrates or too many calories at one sitting increases the likelihood of GI side effects. Our registered dietitian and myself discussed pre-diabetes and metformin with patient. Christy Serrano declined metformin for now and a prescription was not written today. Christy Serrano agrees to follow up with our clinic in 2 weeks as directed to monitor her progress.  I spent > than 50% of the 15 minute visit on counseling as documented in the note.  Obesity Christy Serrano  is currently in the action stage of change. As such, her goal is to continue with weight loss efforts She has agreed to follow the Category 3 plan Christy Serrano has been instructed to work up to a goal of 150 minutes of combined cardio and strengthening exercise per week for weight loss and overall health benefits. We discussed the following Behavioral Modification Strategies today: increasing lean protein intake, increasing vegetables, work on meal planning and easy cooking plans, increase H20 intake, and no skipping meals   Christy Serrano has agreed to follow up with our clinic in 2 weeks. She was informed of the importance of frequent follow up visits to maximize her success with intensive lifestyle modifications for her multiple health conditions.   OBESITY BEHAVIORAL INTERVENTION VISIT  Today's visit was # 2   Starting weight: 195 lbs Starting date: 12/22/17 Today's weight : 191 lbs  Today's date: 01/05/2018 Total lbs lost to date: 4    ASK: We discussed the diagnosis of obesity with Christy Serrano today and Christy Serrano agreed to give [place] permission to discuss obesity behavioral modification therapy today.  ASSESS: Lavora has the diagnosis of obesity and her BMI today is 31.78 Christy Serrano  is in the action stage of change   ADVISE: Christy Serrano was educated on the multiple health risks of obesity as well as the benefit of weight loss to improve her health. She was advised of the need for long term treatment and the importance of lifestyle modifications to improve her current health and to decrease her risk of future health problems.  AGREE: Multiple dietary modification options and treatment options were discussed and  Christy Serrano agreed to follow the recommendations documented in the above note.  ARRANGE: Christy Serrano was educated on the importance of frequent visits to treat obesity as outlined per CMS and USPSTF guidelines and agreed to schedule her next follow up appointment today.  Christy Serrano, am acting as transcriptionist for [employer], DO  I have reviewed the above documentation for accuracy and completeness, and I agree with the above. -Jearld Lesch, DO

## 2018-01-09 ENCOUNTER — Encounter (INDEPENDENT_AMBULATORY_CARE_PROVIDER_SITE_OTHER): Payer: Self-pay | Admitting: Bariatrics

## 2018-01-11 ENCOUNTER — Other Ambulatory Visit: Payer: Self-pay | Admitting: Obstetrics and Gynecology

## 2018-01-11 ENCOUNTER — Ambulatory Visit
Admission: RE | Admit: 2018-01-11 | Discharge: 2018-01-11 | Disposition: A | Discharge status: Home / Self Care | Payer: Managed Care, Other (non HMO) | Source: Ambulatory Visit | Attending: Obstetrics and Gynecology | Admitting: Obstetrics and Gynecology

## 2018-01-11 DIAGNOSIS — R928 Other abnormal and inconclusive findings on diagnostic imaging of breast: Secondary | ICD-10-CM

## 2018-01-11 DIAGNOSIS — N6320 Unspecified lump in the left breast, unspecified quadrant: Secondary | ICD-10-CM

## 2018-01-11 DIAGNOSIS — R599 Enlarged lymph nodes, unspecified: Secondary | ICD-10-CM

## 2018-01-18 ENCOUNTER — Ambulatory Visit
Admission: RE | Admit: 2018-01-18 | Discharge: 2018-01-18 | Disposition: A | Discharge status: Home / Self Care | Payer: Managed Care, Other (non HMO) | Source: Ambulatory Visit | Attending: Obstetrics and Gynecology | Admitting: Obstetrics and Gynecology

## 2018-01-18 ENCOUNTER — Other Ambulatory Visit: Payer: Self-pay | Admitting: Obstetrics and Gynecology

## 2018-01-18 DIAGNOSIS — N6320 Unspecified lump in the left breast, unspecified quadrant: Secondary | ICD-10-CM

## 2018-01-18 DIAGNOSIS — R921 Mammographic calcification found on diagnostic imaging of breast: Secondary | ICD-10-CM

## 2018-01-18 DIAGNOSIS — R599 Enlarged lymph nodes, unspecified: Secondary | ICD-10-CM

## 2018-01-19 ENCOUNTER — Ambulatory Visit (INDEPENDENT_AMBULATORY_CARE_PROVIDER_SITE_OTHER): Payer: Managed Care, Other (non HMO) | Admitting: Bariatrics

## 2018-01-19 VITALS — BP 129/60 | HR 70 | Temp 98.3°F | Ht 65.0 in | Wt 188.0 lb

## 2018-01-19 DIAGNOSIS — E559 Vitamin D deficiency, unspecified: Secondary | ICD-10-CM | POA: Diagnosis not present

## 2018-01-19 DIAGNOSIS — Z6831 Body mass index (BMI) 31.0-31.9, adult: Secondary | ICD-10-CM

## 2018-01-19 DIAGNOSIS — R7303 Prediabetes: Secondary | ICD-10-CM | POA: Diagnosis not present

## 2018-01-19 DIAGNOSIS — E66811 Obesity, class 1: Secondary | ICD-10-CM

## 2018-01-19 DIAGNOSIS — E669 Obesity, unspecified: Secondary | ICD-10-CM | POA: Diagnosis not present

## 2018-01-19 NOTE — Progress Notes (Signed)
Office: [phone]  /  Fax: [phone]   HPI:   Chief Complaint: OBESITY Christy Serrano is here to discuss her progress with her obesity treatment plan. She is on the Category 3 plan and is following her eating plan approximately 100 % of the time. She states she is exercising 0 minutes 0 times per week. Christy Serrano has had some lower back pain which limits some activity. She is eating appropriate snacks. Her hunger is controlled and she is having limited cravings.  Her weight is 188 lb (85.3 kg) today and has had a weight loss of 3 pounds over a period of 2 weeks since her last visit. She has lost 7 lbs since starting treatment with [place].  Vitamin D deficiency Christy Serrano has a diagnosis of vitamin D deficiency. She is currently taking OTC vit D and denies nausea, vomiting or muscle weakness.  Pre-Diabetes Christy Serrano has a diagnosis of pre-diabetes based on her elevated Hgb A1c and was informed this puts her at greater risk of developing diabetes. She is not taking metformin currently and continues to work on diet and exercise to decrease risk of diabetes. Her last Hgb A1c was 6.1 and Insulin was 13.7 on 12/22/17. She denies polyuria or polydipsia.  ALLERGIES: No Known Allergies  MEDICATIONS: Current Outpatient Medications on File Prior to Visit  Medication Sig Dispense Refill  . albuterol (PROVENTIL HFA;VENTOLIN HFA) 108 (90 Base) MCG/ACT inhaler Inhale into the lungs every 6 (six) hours as needed for wheezing or shortness of breath.    Marland Kitchen aspirin 81 MG chewable tablet Chew by mouth daily.    . Calcium-Phosphorus-Vitamin D (CITRACAL +D3 PO) Take 1,200 mcg by mouth.    . cholecalciferol (VITAMIN D) 1000 units tablet Take 1,000 Units by mouth daily.    Marland Kitchen estradiol (VIVELLE-DOT) 0.025 MG/24HR Place 1 patch onto the skin 2 (two) times a week.    Marland Kitchen ibuprofen (ADVIL,MOTRIN) 200 MG tablet Take 200 mg by mouth every 6 (six) hours as needed.    . Multiple Vitamin (MULTIVITAMIN) capsule Take 1 capsule by  mouth daily.     No current facility-administered medications on file prior to visit.     PAST MEDICAL HISTORY: Past Medical History:  Diagnosis Date  . Lactose intolerance     PAST SURGICAL HISTORY: Past Surgical History:  Procedure Laterality Date  . HYSTEROTOMY    . ROTATOR CUFF REPAIR      SOCIAL HISTORY: Social History   Tobacco Use  . Smoking status: Never Smoker  . Smokeless tobacco: Never Used  Substance Use Topics  . Alcohol use: No  . Drug use: No    FAMILY HISTORY: Family History  Problem Relation Age of Onset  . High blood pressure Mother   . High blood pressure Father   . Cancer Father     ROS: Review of Systems  Constitutional: Positive for weight loss.  Gastrointestinal: Negative for nausea and vomiting.  Genitourinary:       Negative for polyuria.  Musculoskeletal:       Negative for muscle weakness.  Endo/Heme/Allergies: Negative for polydipsia.    PHYSICAL EXAM: Blood pressure 129/60, pulse 70, temperature 98.3 F (36.8 C), temperature source Oral, height 5\' 5"  (1.651 m), weight 188 lb (85.3 kg), SpO2 98 %. Body mass index is 31.28 kg/m. Physical Exam  Constitutional: She is oriented to person, place, and time. She appears well-developed and well-nourished.  Cardiovascular: Normal rate.  Pulmonary/Chest: Effort normal.  Musculoskeletal: Normal range of motion.  Neurological: She is oriented  to person, place, and time.  Skin: Skin is warm and dry.  Psychiatric: She has a normal mood and affect. Her behavior is normal.  Vitals reviewed.   RECENT LABS AND TESTS: BMET    Component Value Date/Time   NA 138 12/22/2017 1235   K 4.2 12/22/2017 1235   CL 99 12/22/2017 1235   CO2 24 12/22/2017 1235   GLUCOSE 81 12/22/2017 1235   GLUCOSE 87 07/17/2008 0811   BUN 12 12/22/2017 1235   CREATININE 0.92 12/22/2017 1235   CALCIUM 9.1 12/22/2017 1235   GFRNONAA 68 12/22/2017 1235   GFRAA 78 12/22/2017 1235   Lab Results  Component Value  Date   HGBA1C 6.1 (H) 12/22/2017   Lab Results  Component Value Date   INSULIN 13.7 12/22/2017   CBC    Component Value Date/Time   WBC 5.3 12/22/2017 1235   WBC 10.1 07/18/2008 0525   RBC 4.15 12/22/2017 1235   RBC 3.13 (L) 07/18/2008 0525   HGB 12.3 12/22/2017 1235   HCT 38.2 12/22/2017 1235   PLT 145 (L) 07/18/2008 0525   MCV 92 12/22/2017 1235   MCH 29.6 12/22/2017 1235   MCHC 32.2 12/22/2017 1235   MCHC 34.4 07/18/2008 0525   RDW 14.8 12/22/2017 1235   LYMPHSABS 2.0 12/22/2017 1235   EOSABS 0.0 12/22/2017 1235   BASOSABS 0.0 12/22/2017 1235   Iron/TIBC/Ferritin/ %Sat No results found for: IRON, TIBC, FERRITIN, IRONPCTSAT Lipid Panel     Component Value Date/Time   CHOL 176 12/22/2017 1235   TRIG 111 12/22/2017 1235   HDL 61 12/22/2017 1235   LDLCALC 93 12/22/2017 1235   Hepatic Function Panel     Component Value Date/Time   PROT 7.6 12/22/2017 1235   ALBUMIN 4.8 12/22/2017 1235   AST 18 12/22/2017 1235   ALT 12 12/22/2017 1235   ALKPHOS 67 12/22/2017 1235   BILITOT 0.7 12/22/2017 1235      Component Value Date/Time   TSH 1.930 12/22/2017 1235    ASSESSMENT AND PLAN: Vitamin D deficiency  Prediabetes  At risk for osteoporosis  Class 1 obesity with serious comorbidity and body mass index (BMI) of 31.0 to 31.9 in adult, unspecified obesity type  PLAN:  Vitamin D Deficiency Christy Serrano was informed that low vitamin D levels contributes to fatigue and are associated with obesity, breast, and colon cancer. She agrees to continue to take OTC Vit D @1 ,000 IU daily and will follow up for routine testing of vitamin D, at least 2-3 times per year. She was informed of the risk of over-replacement of vitamin D and agrees to not increase her dose unless she discusses this with [place] first. She agrees to follow up as directed.  Pre-Diabetes Christy Serrano will continue to work on weight loss, exercise, and decreasing simple carbohydrates in her diet to help decrease the  risk of diabetes.  She was informed that eating too many simple carbohydrates or too many calories at one sitting increases the likelihood of GI side effects. Christy Serrano agrees to continue decreasing carbohydrates and increasing protein in her diet. Christy Serrano agreed to follow up with [place] as directed to monitor her progress in 2 weeks.  I spent > than 50% of the 15 minute visit on counseling as documented in the note.  Obesity Christy Serrano is currently in the action stage of change. As such, her goal is to continue with weight loss efforts She has agreed to follow the Category 3 plan and she will redistribute her meat and  add it in the morning meal. Christy Serrano has been instructed to work up to a goal of 150 minutes of combined cardio and strengthening exercise per week for weight loss and overall health benefits. We discussed the following Behavioral Modification Strategies today: increasing lean protein intake, decreasing simple carbohydrates, increasing vegetables, increase H2O intake, decrease eating out, no skipping meals, work on meal planning and easy cooking plans, ways to avoid boredom eating, and ways to avoid night time snacking.  Christy Serrano has agreed to follow up with our clinic in 2 weeks. She was informed of the importance of frequent follow up visits to maximize her success with intensive lifestyle modifications for her multiple health conditions.   OBESITY BEHAVIORAL INTERVENTION VISIT  Today's visit was # 3   Starting weight: 195 lbs Starting date: 12/22/17 Today's weight : Weight: 188 lb (85.3 kg)  Today's date: 01/19/2018 Total lbs lost to date: 7  ASK: We discussed the diagnosis of obesity with Christy Serrano today and Christy Serrano agreed to give [place] permission to discuss obesity behavioral modification therapy today.  ASSESS: Christy Serrano has the diagnosis of obesity and her BMI today is 31.28. Christy Serrano is in the action stage of change.   ADVISE: Christy Serrano was educated on the multiple  health risks of obesity as well as the benefit of weight loss to improve her health. She was advised of the need for long term treatment and the importance of lifestyle modifications to improve her current health and to decrease her risk of future health problems.  AGREE: Multiple dietary modification options and treatment options were discussed and Christy Serrano agreed to follow the recommendations documented in the above note.  ARRANGE: Christy Serrano was educated on the importance of frequent visits to treat obesity as outlined per CMS and USPSTF guidelines and agreed to schedule her next follow up appointment today.  I, Marcille Blanco, am acting as Location manager for [employer]. Owens Shark, DO  I have reviewed the above documentation for accuracy and completeness, and I agree with the above. -Jearld Lesch, DO

## 2018-01-21 ENCOUNTER — Encounter (INDEPENDENT_AMBULATORY_CARE_PROVIDER_SITE_OTHER): Payer: Self-pay | Admitting: Bariatrics

## 2018-01-25 ENCOUNTER — Ambulatory Visit
Admission: RE | Admit: 2018-01-25 | Discharge: 2018-01-25 | Disposition: A | Discharge status: Home / Self Care | Payer: Managed Care, Other (non HMO) | Source: Ambulatory Visit | Attending: Obstetrics and Gynecology | Admitting: Obstetrics and Gynecology

## 2018-01-25 DIAGNOSIS — R921 Mammographic calcification found on diagnostic imaging of breast: Secondary | ICD-10-CM

## 2018-02-02 ENCOUNTER — Telehealth: Payer: Self-pay | Admitting: Hematology and Oncology

## 2018-02-02 ENCOUNTER — Encounter: Payer: Self-pay | Admitting: Licensed Clinical Social Worker

## 2018-02-02 ENCOUNTER — Ambulatory Visit (INDEPENDENT_AMBULATORY_CARE_PROVIDER_SITE_OTHER): Payer: Managed Care, Other (non HMO) | Admitting: Bariatrics

## 2018-02-02 VITALS — BP 123/56 | HR 67 | Temp 98.4°F | Ht 65.0 in | Wt 186.0 lb

## 2018-02-02 DIAGNOSIS — Z6831 Body mass index (BMI) 31.0-31.9, adult: Secondary | ICD-10-CM | POA: Diagnosis not present

## 2018-02-02 DIAGNOSIS — E559 Vitamin D deficiency, unspecified: Secondary | ICD-10-CM | POA: Diagnosis not present

## 2018-02-02 DIAGNOSIS — R7303 Prediabetes: Secondary | ICD-10-CM | POA: Diagnosis not present

## 2018-02-02 DIAGNOSIS — E669 Obesity, unspecified: Secondary | ICD-10-CM

## 2018-02-02 NOTE — Telephone Encounter (Signed)
New referral received from [facility] for med onc and genetics. Pt has been scheduled to see Dr. Lindi Adie on 11/13 at 415pm and to see Faith Rogue for genetic counseling on 11/18 at 2pm. Letter with genetic counseling appt mailed to the pt.

## 2018-02-03 ENCOUNTER — Inpatient Hospital Stay: Payer: Managed Care, Other (non HMO) | Attending: Hematology and Oncology | Admitting: Hematology and Oncology

## 2018-02-03 DIAGNOSIS — C50912 Malignant neoplasm of unspecified site of left female breast: Secondary | ICD-10-CM | POA: Insufficient documentation

## 2018-02-03 DIAGNOSIS — C50412 Malignant neoplasm of upper-outer quadrant of left female breast: Secondary | ICD-10-CM | POA: Diagnosis not present

## 2018-02-03 DIAGNOSIS — Z90710 Acquired absence of both cervix and uterus: Secondary | ICD-10-CM | POA: Insufficient documentation

## 2018-02-03 DIAGNOSIS — Z79899 Other long term (current) drug therapy: Secondary | ICD-10-CM

## 2018-02-03 DIAGNOSIS — Z171 Estrogen receptor negative status [ER-]: Secondary | ICD-10-CM | POA: Insufficient documentation

## 2018-02-03 DIAGNOSIS — Z7982 Long term (current) use of aspirin: Secondary | ICD-10-CM | POA: Insufficient documentation

## 2018-02-03 NOTE — Progress Notes (Signed)
[facility] CONSULT NOTE  Patient Care Team: Velna Hatchet, MD as PCP - General (Internal Medicine)  CHIEF COMPLAINTS/PURPOSE OF CONSULTATION:  Newly diagnosed breast cancer  HISTORY OF PRESENTING ILLNESS:  Christy Serrano 62 y.o. female is here because of recent diagnosis of left breast cancer.  Patient had a routine screening mammogram that detected abnormalities in the left breast.  This was further evaluated by ultrasound and she was noted to have segmental microcalcification measuring 5.4 cm and ultrasound picked up to 1.7 cm mass.  The ultrasound-guided biopsy of the 1.7 cm mass came back as IDC grade 3 that was ER PR negative HER-2 positive.  Left axillary ultrasound revealed 3 enlarged lymph nodes 1 of which was biopsied and it was positive for the same type of breast cancer.  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 She is a sister of my patient and Pleas Koch accompanied her today.  She works for [employer].  She is very concerned about her work and ability to take time off.  I reviewed her records extensively and collaborated the history with the patient.  SUMMARY OF ONCOLOGIC HISTORY:   Malignant neoplasm of upper-outer quadrant of left breast in female, estrogen receptor negative ([place])   01/18/2018 Initial Diagnosis    Screening mammogram detected microcalcifications and asymmetry, left breast segmental microcalcifications UOQ 1.5 x 4.3 x 5.4 cm biopsy-proven IDC with DCIS with LV I, grade 3, ER 0%, PR 0%, Ki-67 30%, HER-2 +3+ by IHC; hypoechoic mass 2 o'clock position 1.2 x 1.3 x 1.7 cm biopsy IDC, LV I present, grade 2-3, ER 0%, PR 0%, Ki-67 40%, HER-2 +3+, left axillary lymph node positive (3 nodes noted by [place]) T3 N1 stage IIIA    02/03/2018 Cancer Staging    Staging form: Breast, AJCC 8th Edition - Clinical: Stage IIIA (cT3, cN1, cM0, G3,  ER-, PR-, HER2+) - Signed by Nicholas Lose, MD on 02/03/2018      MEDICAL HISTORY:  Past Medical History:  Diagnosis Date  . Lactose intolerance     SURGICAL HISTORY: Past Surgical History:  Procedure Laterality Date  . HYSTEROTOMY    . ROTATOR CUFF REPAIR      SOCIAL HISTORY: Social History   Socioeconomic History  . Marital status: Single    Spouse name: Not on file  . Number of children: Not on file  . Years of education: Not on file  . Highest education level: Not on file  Occupational History  . Occupation: Quarry manager  Social Needs  . Financial resource strain: Not on file  . Food insecurity:    Worry: Not on file    Inability: Not on file  . Transportation needs:    Medical: Not on file    Non-medical: Not on file  Tobacco Use  . Smoking status: Never Smoker  . Smokeless tobacco: Never Used  Substance and Sexual Activity  . Alcohol use: No  . Drug use: No  . Sexual activity: Not on file  Lifestyle  . Physical activity:    Days per week: Not on file    Minutes per session: Not on file  . Stress: Not on file  Relationships  . Social connections:    Talks on phone: Not on file    Gets together: Not on file    Attends religious service: Not on file    Active member  of club or organization: Not on file    Attends meetings of clubs or organizations: Not on file    Relationship status: Not on file  . Intimate partner violence:    Fear of current or ex partner: Not on file    Emotionally abused: Not on file    Physically abused: Not on file    Forced sexual activity: Not on file  Other Topics Concern  . Not on file  Social History Narrative  . Not on file    FAMILY HISTORY: Family History  Problem Relation Age of Onset  . High blood pressure Mother   . High blood pressure Father   . Cancer Father     ALLERGIES:  has No Known Allergies.  MEDICATIONS:  Current Outpatient Medications  Medication Sig Dispense Refill  . albuterol (PROVENTIL  HFA;VENTOLIN HFA) 108 (90 Base) MCG/ACT inhaler Inhale into the lungs every 6 (six) hours as needed for wheezing or shortness of breath.    Marland Kitchen aspirin 81 MG chewable tablet Chew by mouth daily.    . Calcium-Phosphorus-Vitamin D (CITRACAL +D3 PO) Take 1,200 mcg by mouth.    . cholecalciferol (VITAMIN D) 1000 units tablet Take 1,000 Units by mouth daily.    Marland Kitchen estradiol (VIVELLE-DOT) 0.025 MG/24HR Place 1 patch onto the skin 2 (two) times a week.    Marland Kitchen ibuprofen (ADVIL,MOTRIN) 200 MG tablet Take 200 mg by mouth every 6 (six) hours as needed.    . Multiple Vitamin (MULTIVITAMIN) capsule Take 1 capsule by mouth daily.     No current facility-administered medications for this visit.     REVIEW OF SYSTEMS:   Constitutional: Denies fevers, chills or abnormal night sweats Eyes: Denies blurriness of vision, double vision or watery eyes Ears, nose, mouth, throat, and face: Denies mucositis or sore throat Respiratory: Denies cough, dyspnea or wheezes Cardiovascular: Denies palpitation, chest discomfort or lower extremity swelling Gastrointestinal:  Denies nausea, heartburn or change in bowel habits Skin: Denies abnormal skin rashes Lymphatics: Denies new lymphadenopathy or easy bruising Neurological:Denies numbness, tingling or new weaknesses Behavioral/Psych: Mood is stable, no new changes  Breast:  Denies any palpable lumps or discharge All other systems were reviewed with the patient and are negative.  PHYSICAL EXAMINATION: ECOG PERFORMANCE STATUS: 1 - Symptomatic but completely ambulatory  Vitals:   02/03/18 1617  BP: 134/83  Pulse: 65  Resp: 18  Temp: (!) 97.4 F (36.3 C)  SpO2: 100%   Filed Weights   02/03/18 1617  Weight: 191 lb 8 oz (86.9 kg)    GENERAL:alert, no distress and comfortable SKIN: skin color, texture, turgor are normal, no rashes or significant lesions EYES: normal, conjunctiva are pink and non-injected, sclera clear OROPHARYNX:no exudate, no erythema and lips,  buccal mucosa, and tongue normal  NECK: supple, thyroid normal size, non-tender, without nodularity LYMPH:  no palpable lymphadenopathy in the cervical, axillary or inguinal LUNGS: clear to auscultation and percussion with normal breathing effort HEART: regular rate & rhythm and no murmurs and no lower extremity edema ABDOMEN:abdomen soft, non-tender and normal bowel sounds Musculoskeletal:no cyanosis of digits and no clubbing  PSYCH: alert & oriented x 3 with fluent speech NEURO: no focal motor/sensory deficits BREAST: No palpable nodules in breast. No palpable axillary or supraclavicular lymphadenopathy (exam performed in the presence of a chaperone)   LABORATORY DATA:  I have reviewed the data as listed Lab Results  Component Value Date   WBC 5.3 12/22/2017   HGB 12.3 12/22/2017   HCT 38.2  12/22/2017   MCV 92 12/22/2017   PLT 145 (L) 07/18/2008   Lab Results  Component Value Date   NA 138 12/22/2017   K 4.2 12/22/2017   CL 99 12/22/2017   CO2 24 12/22/2017    RADIOGRAPHIC STUDIES: I have personally reviewed the radiological reports and agreed with the findings in the report.  ASSESSMENT AND PLAN:  Malignant neoplasm of upper-outer quadrant of left breast in female, estrogen receptor negative ([place]) 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, grade 3, ER 0%, PR 0%, Ki-67 30%, HER-2 +3+ by IHC; hypoechoic mass 2 o'clock position 1.2 x 1.3 x 1.7 cm biopsy IDC, LV I present, grade 2-3, ER 0%, PR 0%, Ki-67 40%, HER-2 +3+, left axillary lymph node positive (3 nodes noted by [place]) T3 N1 stage IIIA  Pathology and radiology counseling: Discussed with the patient, the details of pathology including the type of breast cancer,the clinical staging, the significance of ER, PR and HER-2/neu receptors and the implications for treatment. After reviewing the  pathology in detail, we proceeded to discuss the different treatment options between surgery, radiation, chemotherapy, antiestrogen therapies.  Recommendation 1.  Neoadjuvant chemotherapy with [place] followed by Herceptin Perjeta maintenance versus Kadcyla maintenance 2. possible mastectomy with targeted node dissection 3.  Adjuvant radiation therapy  Plan: 1.  Breast MRI 2. CT chest abdomen pelvis 3.  Port placement 4.  Echocardiogram 5.  Chemo class Start chemotherapy in February 26, 2018 Patient is interested in upbeat clinical trial. She is taking time off from Thanksgiving and does not want to start treatment until she comes back from that vacation.   All questions were answered. The patient knows to call the clinic with any problems, questions or concerns.    Harriette Ohara, MD 02/03/18

## 2018-02-03 NOTE — Assessment & Plan Note (Signed)
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, grade 3, ER 0%, PR 0%, Ki-67 30%, HER-2 +3+ by IHC; hypoechoic mass 2 o'clock position 1.2 x 1.3 x 1.7 cm biopsy IDC, LV I present, grade 2-3, ER 0%, PR 0%, Ki-67 40%, HER-2 +3+, left axillary lymph node positive (3 nodes noted by [place]) T3 N1 stage IIIA  Pathology and radiology counseling: Discussed with the patient, the details of pathology including the type of breast cancer,the clinical staging, the significance of ER, PR and HER-2/neu receptors and the implications for treatment. After reviewing the pathology in detail, we proceeded to discuss the different treatment options between surgery, radiation, chemotherapy, antiestrogen therapies.  Recommendation 1.  Neoadjuvant chemotherapy with [place] Perjeta followed by Herceptin Perjeta maintenance versus Kadcyla maintenance 2. possible mastectomy with targeted node dissection 3.  Adjuvant radiation therapy  Plan: 1.  Breast MRI 2. CT chest abdomen pelvis 3.  Port placement 4.  Echocardiogram 5.  Chemo class Start chemotherapy in 1 to 2 weeks 

## 2018-02-04 ENCOUNTER — Telehealth: Payer: Self-pay | Admitting: *Deleted

## 2018-02-04 ENCOUNTER — Ambulatory Visit: Payer: Self-pay | Admitting: General Surgery

## 2018-02-04 ENCOUNTER — Telehealth: Payer: Self-pay | Admitting: Hematology and Oncology

## 2018-02-04 NOTE — Progress Notes (Signed)
Office: [phone]  /  Fax: [phone]   HPI:   Chief Complaint: OBESITY Christy Serrano is here to discuss her progress with her obesity treatment plan. She is on the Category 3 plan and is following her eating plan approximately 100 % of the time. She states she is exercising 0 minutes 0 times per week. Caraline denies challenges with the Category 3 plan. She has been diagnosed with breast cancer. She is going to see an oncologist to discuss options. Her hunger has been controlled and she is not struggling with snacks.  Her weight is 186 lb (84.4 kg) today and has had a weight loss of 2 pounds over a period of 2 weeks since her last visit. She has lost 9 lbs since starting treatment with [place].  Pre-Diabetes Ceasia has a diagnosis of pre-diabetes based on her elevated Hgb A1c and was informed this puts her at greater risk of developing diabetes. Her last Hgb A1c was 6.1 and Insulin was 13.7 on 12/22/17. She is not taking metformin currently and continues to work on diet and exercise to decrease risk of diabetes. She denies polyuria or polydipsia.  Vitamin D deficiency Mazi has a diagnosis of vitamin D deficiency. She is currently taking OTC vit D and denies nausea, vomiting or muscle weakness.  ALLERGIES: No Known Allergies  MEDICATIONS: Current Outpatient Medications on File Prior to Visit  Medication Sig Dispense Refill  . albuterol (PROVENTIL HFA;VENTOLIN HFA) 108 (90 Base) MCG/ACT inhaler Inhale into the lungs every 6 (six) hours as needed for wheezing or shortness of breath.    Marland Kitchen aspirin 81 MG chewable tablet Chew by mouth daily.    . Calcium-Phosphorus-Vitamin D (CITRACAL +D3 PO) Take 1,200 mcg by mouth.    . cholecalciferol (VITAMIN D) 1000 units tablet Take 1,000 Units by mouth daily.    Marland Kitchen estradiol (VIVELLE-DOT) 0.025 MG/24HR Place 1 patch onto the skin 2 (two) times a week.    Marland Kitchen ibuprofen (ADVIL,MOTRIN) 200 MG tablet Take 200 mg by mouth every 6 (six) hours as needed.    .  Multiple Vitamin (MULTIVITAMIN) capsule Take 1 capsule by mouth daily.     No current facility-administered medications on file prior to visit.     PAST MEDICAL HISTORY: Past Medical History:  Diagnosis Date  . Lactose intolerance     PAST SURGICAL HISTORY: Past Surgical History:  Procedure Laterality Date  . HYSTEROTOMY    . ROTATOR CUFF REPAIR      SOCIAL HISTORY: Social History   Tobacco Use  . Smoking status: Never Smoker  . Smokeless tobacco: Never Used  Substance Use Topics  . Alcohol use: No  . Drug use: No    FAMILY HISTORY: Family History  Problem Relation Age of Onset  . High blood pressure Mother   . High blood pressure Father   . Cancer Father     ROS: Review of Systems  Constitutional: Positive for weight loss.  Gastrointestinal: Negative for nausea and vomiting.  Genitourinary:       Negative for polyuria.  Musculoskeletal:       Negative for muscle weakness.  Endo/Heme/Allergies: Negative for polydipsia.    PHYSICAL EXAM: Blood pressure (!) 123/56, pulse 67, temperature 98.4 F (36.9 C), temperature source Oral, height 5\' 5"  (1.651 m), weight 186 lb (84.4 kg), SpO2 99 %. Body mass index is 30.95 kg/m. Physical Exam  Constitutional: She is oriented to person, place, and time. She appears well-developed and well-nourished.  Cardiovascular: Normal rate.  Pulmonary/Chest: Effort  normal.  Musculoskeletal: Normal range of motion.  Neurological: She is oriented to person, place, and time.  Skin: Skin is warm and dry.  Psychiatric: She has a normal mood and affect. Her behavior is normal.  Vitals reviewed.   RECENT LABS AND TESTS: BMET    Component Value Date/Time   NA 138 12/22/2017 1235   K 4.2 12/22/2017 1235   CL 99 12/22/2017 1235   CO2 24 12/22/2017 1235   GLUCOSE 81 12/22/2017 1235   GLUCOSE 87 07/17/2008 0811   BUN 12 12/22/2017 1235   CREATININE 0.92 12/22/2017 1235   CALCIUM 9.1 12/22/2017 1235   GFRNONAA 68 12/22/2017 1235    GFRAA 78 12/22/2017 1235   Lab Results  Component Value Date   HGBA1C 6.1 (H) 12/22/2017   Lab Results  Component Value Date   INSULIN 13.7 12/22/2017   CBC    Component Value Date/Time   WBC 5.3 12/22/2017 1235   WBC 10.1 07/18/2008 0525   RBC 4.15 12/22/2017 1235   RBC 3.13 (L) 07/18/2008 0525   HGB 12.3 12/22/2017 1235   HCT 38.2 12/22/2017 1235   PLT 145 (L) 07/18/2008 0525   MCV 92 12/22/2017 1235   MCH 29.6 12/22/2017 1235   MCHC 32.2 12/22/2017 1235   MCHC 34.4 07/18/2008 0525   RDW 14.8 12/22/2017 1235   LYMPHSABS 2.0 12/22/2017 1235   EOSABS 0.0 12/22/2017 1235   BASOSABS 0.0 12/22/2017 1235   Iron/TIBC/Ferritin/ %Sat No results found for: IRON, TIBC, FERRITIN, IRONPCTSAT Lipid Panel     Component Value Date/Time   CHOL 176 12/22/2017 1235   TRIG 111 12/22/2017 1235   HDL 61 12/22/2017 1235   LDLCALC 93 12/22/2017 1235   Hepatic Function Panel     Component Value Date/Time   PROT 7.6 12/22/2017 1235   ALBUMIN 4.8 12/22/2017 1235   AST 18 12/22/2017 1235   ALT 12 12/22/2017 1235   ALKPHOS 67 12/22/2017 1235   BILITOT 0.7 12/22/2017 1235      Component Value Date/Time   TSH 1.930 12/22/2017 1235   Results for Date, Avenell D (MRN [id number]) as of 02/04/2018 18:03  Ref. Range 12/22/2017 12:35  Vitamin D, 25-Hydroxy Latest Ref Range: 30.0 - 100.0 ng/mL 41.9   ASSESSMENT AND PLAN: Prediabetes  Vitamin D deficiency  Class 1 obesity with serious comorbidity and body mass index (BMI) of 31.0 to 31.9 in adult, unspecified obesity type  PLAN:  Pre-Diabetes Pranathi will continue to work on weight loss, exercise, and decreasing simple carbohydrates in her diet to help decrease the risk of diabetes. She was informed that eating too many simple carbohydrates or too many calories at one sitting increases the likelihood of GI side effects. She agrees to decrease carbohydrates and increase protein. Marcena agreed to follow up with [place] as directed to  monitor her progress in 2 weeks.  Vitamin D Deficiency Camry was informed that low vitamin D levels contributes to fatigue and are associated with obesity, breast, and colon cancer. She agrees to continue to take OTC Vit D every day and will follow up for routine testing of vitamin D, at least 2-3 times per year. She was informed of the risk of over-replacement of vitamin D and agrees to not increase her dose unless she discusses this with [place] first. She agrees to follow up as directed.  I spent > than 50% of the 15 minute visit on counseling as documented in the note.  Obesity Kelsei is currently in the action  stage of change. As such, her goal is to continue with weight loss efforts. She has agreed to follow the Category 3 plan. Nitisha has been instructed to work up to a goal of 150 minutes of combined cardio and strengthening exercise per week for weight loss and overall health benefits. We discussed the following Behavioral Modification Strategies today: increasing lean protein intake, decreasing simple carbohydrates, increasing vegetables, increase H2O intake, decrease eating out, no skipping meals, and work on meal planning and easy cooking plans.  Sheryll has agreed to follow up with our clinic in 2 weeks. She was informed of the importance of frequent follow up visits to maximize her success with intensive lifestyle modifications for her multiple health conditions.   OBESITY BEHAVIORAL INTERVENTION VISIT  Today's visit was # 4   Starting weight: 195 lbs Starting date: 12/22/17 Today's weight : Weight: 186 lb (84.4 kg)  Today's date: 02/02/2018 Total lbs lost to date: 9  ASK: We discussed the diagnosis of obesity with Mackie Pai today and Sharyn Lull agreed to give [place] permission to discuss obesity behavioral modification therapy today.  ASSESS: Emberlie has the diagnosis of obesity and her BMI today is 31.87. Gerardo is in the action stage of change.    ADVISE: Dianne was educated on the multiple health risks of obesity as well as the benefit of weight loss to improve her health. She was advised of the need for long term treatment and the importance of lifestyle modifications to improve her current health and to decrease her risk of future health problems.  AGREE: Multiple dietary modification options and treatment options were discussed and Alece agreed to follow the recommendations documented in the above note.  ARRANGE: Rashawnda was educated on the importance of frequent visits to treat obesity as outlined per CMS and USPSTF guidelines and agreed to schedule her next follow up appointment today.  I, Marcille Blanco, am acting as Location manager for [employer]. Owens Shark, DO  I have reviewed the above documentation for accuracy and completeness, and I agree with the above. -Jearld Lesch, DO

## 2018-02-04 NOTE — Telephone Encounter (Signed)
Per 11/13 los.  Add-on approval from infusion needed for 12/6.  Waiting on reply.  Patient did not stop by for avs or calendar.  De-coupled appt for first infusion due to long treatment.  VG not avail on 12/13 for f/u appt; therefore, made on 12/16. Patient will need contrast from Radiology.

## 2018-02-04 NOTE — Telephone Encounter (Signed)
WF [id number], Understanding and Predicting Breast Cancer Events after Treatment.  Dr. Lindi Adie referred patient for the Upbeat study.  I called patient and introduced myself and briefly described the study.  She states Dr. Lindi Adie told her about it and she is interested in learning more.  Patient agreed to meet with this research nurse after her Genetic Counseling/lab appointment next week Monday 11/18 to discuss the study in more detail.  Thanked patient for her time and willingness to consider this study.  I look forward to meeting her next week.  Foye Spurling, BSN, RN Clinical Research Nurse 02/04/2018 3:07 PM

## 2018-02-05 ENCOUNTER — Telehealth: Payer: Self-pay

## 2018-02-05 NOTE — Telephone Encounter (Signed)
Called pt and lvm with call back number to confirmed appt with her upcoming echo on 11/21 next week.

## 2018-02-05 NOTE — Progress Notes (Signed)
Location of Breast Cancer: Left Breast  Histology per Pathology Report:  01/18/18 Diagnosis 1. 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 - THERE IS NO EVIDENCE OF MALIGNANCY. - SEE COMMENT. 2. Breast, left, needle core biopsy, 2 o'clock - INVASIVE MAMMARY CARCINOMA. - LYMPHOVASCULAR INVASION IS IDENTIFIED.  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 Lymph node, needle/core biopsy, left axilla - METASTATIC CARCINOMA IN 1 OF 1 LYMPH NODE (1/1).  Receptor Status: ER (NEG), PR(NEG), Ki-(30%), Her2-neu(POS)  Did patient present with symptoms or was this found on screening mammography?: It was found on a screening mammogram.   Past/Anticipated interventions by surgeon, if any: Dr. Marlou Starks   Past/Anticipated interventions by medical oncology, if any:  02/03/18 Dr. Lindi Adie Recommendation 1.  Neoadjuvant chemotherapy with [place] followed by Herceptin Perjeta maintenance versus Kadcyla maintenance 2. possible mastectomy with targeted node dissection 3.  Adjuvant radiation therapy  Plan: 1.  Breast MRI 2. CT chest abdomen pelvis 3.  Port placement 4.  Echocardiogram 5.  Chemo class Start chemotherapy in February 26, 2018 Patient is interested in upbeat clinical trial. She is taking time off from Thanksgiving and does not want to start treatment until she comes back from that vacation.  Lymphedema issues, if any:  N/A  Pain issues, if any:  She denies  SAFETY ISSUES:  Prior radiation? No  Pacemaker/ICD? No  Possible current pregnancy? No  Is the patient on methotrexate? No  Current Complaints / other details:   02/06/18 MRI Breast 02/08/18 Genetic Counseling 02/09/18 Chemo education 02/24/18 ? PAC placement.   BP 137/69 (BP Location: Right  Arm, Patient Position: Sitting)   Pulse 70   Temp 97.8 F (36.6 C) (Oral)   Resp 20   Ht 5' 5"  (1.651 m)   Wt 189 lb 12.8 oz (86.1 kg)   SpO2 100%   BMI 31.58 kg/m    Wt Readings from Last 3 Encounters:  02/09/18 189 lb 12.8 oz (86.1 kg)  02/03/18 191 lb 8 oz (86.9 kg)  02/02/18 186 lb (84.4 kg)      Courvoisier Hamblen, Stephani Police, RN 02/05/2018,2:43 PM

## 2018-02-06 ENCOUNTER — Ambulatory Visit
Admission: RE | Admit: 2018-02-06 | Discharge: 2018-02-06 | Disposition: A | Discharge status: Home / Self Care | Payer: Managed Care, Other (non HMO) | Source: Ambulatory Visit | Attending: Hematology and Oncology | Admitting: Hematology and Oncology

## 2018-02-06 DIAGNOSIS — Z171 Estrogen receptor negative status [ER-]: Principal | ICD-10-CM

## 2018-02-06 DIAGNOSIS — C50412 Malignant neoplasm of upper-outer quadrant of left female breast: Secondary | ICD-10-CM

## 2018-02-06 MED ORDER — GADOBUTROL 1 MMOL/ML IV SOLN
8.0000 mL | Freq: Once | INTRAVENOUS | Status: AC | PRN
  Administered 2018-02-06: 8 mL via INTRAVENOUS

## 2018-02-08 ENCOUNTER — Inpatient Hospital Stay (HOSPITAL_BASED_OUTPATIENT_CLINIC_OR_DEPARTMENT_OTHER): Payer: Managed Care, Other (non HMO) | Admitting: Licensed Clinical Social Worker

## 2018-02-08 ENCOUNTER — Encounter: Payer: Self-pay | Admitting: *Deleted

## 2018-02-08 ENCOUNTER — Other Ambulatory Visit: Payer: Self-pay | Admitting: Hematology and Oncology

## 2018-02-08 ENCOUNTER — Telehealth: Payer: Self-pay | Admitting: Hematology and Oncology

## 2018-02-08 ENCOUNTER — Encounter: Payer: Self-pay | Admitting: Licensed Clinical Social Worker

## 2018-02-08 ENCOUNTER — Inpatient Hospital Stay: Payer: Managed Care, Other (non HMO)

## 2018-02-08 DIAGNOSIS — C50412 Malignant neoplasm of upper-outer quadrant of left female breast: Secondary | ICD-10-CM

## 2018-02-08 DIAGNOSIS — Z1379 Encounter for other screening for genetic and chromosomal anomalies: Secondary | ICD-10-CM

## 2018-02-08 DIAGNOSIS — Z8042 Family history of malignant neoplasm of prostate: Secondary | ICD-10-CM | POA: Insufficient documentation

## 2018-02-08 DIAGNOSIS — Z171 Estrogen receptor negative status [ER-]: Principal | ICD-10-CM

## 2018-02-08 DIAGNOSIS — Z8041 Family history of malignant neoplasm of ovary: Secondary | ICD-10-CM | POA: Insufficient documentation

## 2018-02-08 DIAGNOSIS — Z803 Family history of malignant neoplasm of breast: Secondary | ICD-10-CM | POA: Insufficient documentation

## 2018-02-08 MED ORDER — LIDOCAINE-PRILOCAINE 2.5-2.5 % EX CREA: TOPICAL_CREAM | CUTANEOUS | 3 refills | Status: DC

## 2018-02-08 MED ORDER — LORAZEPAM 0.5 MG PO TABS: 0.5000 mg | ORAL_TABLET | Freq: Every evening | ORAL | 0 refills | Status: DC | PRN

## 2018-02-08 MED ORDER — ONDANSETRON HCL 8 MG PO TABS: 8.0000 mg | ORAL_TABLET | Freq: Two times a day (BID) | ORAL | 1 refills | Status: DC | PRN

## 2018-02-08 MED ORDER — PROCHLORPERAZINE MALEATE 10 MG PO TABS: 10.0000 mg | ORAL_TABLET | Freq: Four times a day (QID) | ORAL | 1 refills | Status: DC | PRN

## 2018-02-08 MED ORDER — DEXAMETHASONE 4 MG PO TABS: 4.0000 mg | ORAL_TABLET | Freq: Every day | ORAL | 0 refills | Status: DC

## 2018-02-08 MED FILL — LORazepam 0.5 MG TABS: 0.5 | 30 days supply | Qty: 30 | Fill #0

## 2018-02-08 MED FILL — PROCHLORPERAZINE 10 MG TAB: 10 | 7 days supply | Qty: 30 | Fill #0

## 2018-02-08 NOTE — Telephone Encounter (Signed)
Scheduled appt per 11/18 sch message - left message fort patient to call back

## 2018-02-08 NOTE — Progress Notes (Signed)
START ON PATHWAY REGIMEN - Breast     A cycle is every 21 days:     Pertuzumab      Pertuzumab      Trastuzumab-xxxx      Trastuzumab-xxxx      Carboplatin      Docetaxel   **Always confirm dose/schedule in your pharmacy ordering system**  Patient Characteristics: Preoperative or Nonsurgical Candidate (Clinical Staging), Neoadjuvant Therapy followed by Surgery, Invasive Disease, Chemotherapy, HER2 Positive, ER Negative/Unknown Therapeutic Status: Preoperative or Nonsurgical Candidate (Clinical Staging) AJCC M Category: cM0 AJCC Grade: G3 Breast Surgical Plan: Neoadjuvant Therapy followed by Surgery ER Status: Negative (-) AJCC 8 Stage Grouping: IIIA HER2 Status: Positive (+) AJCC T Category: cT3 AJCC N Category: cN1 PR Status: Negative (-) Intent of Therapy: Curative Intent, Discussed with Patient 

## 2018-02-08 NOTE — Progress Notes (Signed)
WF [id number], Understanding and Predicting Breast Cancer Events after Treatment.  Met with patient and her sister for 5 minutes today after her Genetic Counseling appointment and before her lab appointment.  Gave patient copy of consent and hippa forms for this study. Informed patient that participation is voluntary. Briefly explained the purpose of this study and the timeline for study activities emphasizing that baseline activities including cardiac MRI would need to be completed prior to starting on chemotherapy. Patient said she was interested in learning more and agreed to meet with this research nurse again tomorrow when she will have more time before chemotherapy education class to discuss in further detail. Thanked patient for her time today and look forward to seeing her tomorrow.  Also gave patient my business card and a clinical research informational brochure.  Foye Spurling, BSN, RN Clinical Research Nurse 02/08/2018 3:49 PM

## 2018-02-08 NOTE — Progress Notes (Signed)
REFERRING PROVIDER: Jovita Kussmaul, MD [address] [place], [place] [address]  PRIMARY PROVIDER:  Velna Hatchet, MD   PRIMARY REASON FOR VISIT:  1. Family history of breast cancer   2. Family history of prostate cancer   3. Family history of ovarian cancer      HISTORY OF PRESENT ILLNESS:   Ms. Peth, a 62 y.o. female, was seen for a  cancer genetics consultation at the request of Dr. Marlou Starks due to a personal and family history of cancer.  Ms. Mchaney presents to clinic today to discuss the possibility of a hereditary predisposition to cancer, genetic testing, and to further clarify her future cancer risks, as well as potential cancer risks for family members.   In 2019, at the age of 77, Ms. Schlemmer was diagnosed with cancer of the left breast, IDC, ER-/PR-/Her2+. The treatment plan includes chemotherapy then surgery, followed by radiation.   CANCER HISTORY:    Malignant neoplasm of upper-outer quadrant of left breast in female, estrogen receptor negative ([place])   01/18/2018 Initial Diagnosis    Screening mammogram detected microcalcifications and asymmetry, left breast segmental microcalcifications UOQ 1.5 x 4.3 x 5.4 cm biopsy-proven IDC with DCIS with LV I, grade 3, ER 0%, PR 0%, Ki-67 30%, HER-2 +3+ by IHC; hypoechoic mass 2 o'clock position 1.2 x 1.3 x 1.7 cm biopsy IDC, LV I present, grade 2-3, ER 0%, PR 0%, Ki-67 40%, HER-2 +3+, left axillary lymph node positive (3 nodes noted by [place]) T3 N1 stage IIIA    02/03/2018 Cancer Staging    Staging form: Breast, AJCC 8th Edition - Clinical: Stage IIIA (cT3, cN1, cM0, G3, ER-, PR-, HER2+) - Signed by Nicholas Lose, MD on 02/03/2018      HORMONAL RISK FACTORS:  Menarche was at age 38.  First live birth at age no children.  OCP use for approximately 15 years.  Ovaries intact: no.  Hysterectomy: yes.  Menopausal status: postmenopausal.  HRT use: 7 years. Colonoscopy: yes; normal. Mammogram within the last year:  yes. Number of breast biopsies: 1.   Past Medical History:  Diagnosis Date  . Family history of breast cancer   . Family history of ovarian cancer   . Family history of prostate cancer   . Lactose intolerance     Past Surgical History:  Procedure Laterality Date  . HYSTEROTOMY    . ROTATOR CUFF REPAIR      Social History   Socioeconomic History  . Marital status: Single    Spouse name: Not on file  . Number of children: Not on file  . Years of education: Not on file  . Highest education level: Not on file  Occupational History  . Occupation: Quarry manager  Social Needs  . Financial resource strain: Not on file  . Food insecurity:    Worry: Not on file    Inability: Not on file  . Transportation needs:    Medical: Not on file    Non-medical: Not on file  Tobacco Use  . Smoking status: Never Smoker  . Smokeless tobacco: Never Used  Substance and Sexual Activity  . Alcohol use: No  . Drug use: No  . Sexual activity: Not on file  Lifestyle  . Physical activity:    Days per week: Not on file    Minutes per session: Not on file  . Stress: Not on file  Relationships  . Social connections:    Talks on phone: Not on file  Gets together: Not on file    Attends religious service: Not on file    Active member of club or organization: Not on file    Attends meetings of clubs or organizations: Not on file    Relationship status: Not on file  Other Topics Concern  . Not on file  Social History Narrative  . Not on file     FAMILY HISTORY:  We obtained a detailed, 4-generation family history.  Significant diagnoses are listed below: Family History  Problem Relation Age of Onset  . High blood pressure Mother   . High blood pressure Father   . Prostate cancer Father        dx mid 78s  . Breast cancer Sister 32       negative [facility] 46 gene panel  . Breast cancer Paternal Aunt        dx mid 85s  . Cancer Paternal Uncle        jaw cancer d. 11s  . Ovarian cancer  Maternal Grandmother        d. 35s  . Cancer Paternal Grandmother        either stomach or ovarian cancer  . Breast cancer Sister 43       "negative 21 gene panel"    Ms. Lacewell does not have children. She has two sisters. One of her sisters, Shauna Hugh, was diagnosed with breast cancer a year ago at age 12. She had negative genetic testing through [facility]'s 46 gene panel which was available for review during the session. Her other sister, Zollie Pee also had breast cancer, diagnosed at 60 and had negative genetic testing on a 21 gene panel, report not available during the session. Ms.Strohmeier does not have nieces or nephews.  Ms. Rumsey father had prostate cancer diagnosed in his 41's treated with prostatectomy and radiation, he is living at 60. He had 11 siblings. One of his sisters, Ms. Pincus's aunt, had breast cancer diagnosed in her mid 47's, and she is living at 58. The patient doesn't know if she had genetic testing. One of Ms. Kingbird's uncles had jaw cancer and died in his 5's. A paternal cousin also had cancer, although Ms. Harshbarger does not know what type. The patient's paternal grandmother had cancer, either stomach or ovarian, and died at 38. Paternal grandfather died at 7.  Ms. Pellicano mother is living at 104 and was diagnosed with breast cancer one week before Ms. Lupu's diagnosis. She had one brother who has passed away at 64, and a half sister through her father. No cancers for these individuals, or for Ms. Berndt's maternal cousins. Ms. Grays maternal grandmother had ovarian cancer in her 36's and died in her 53's. Ms. Schuenemann does not have information about her maternal grandfather.  Ms. Santore is aware of previous family history of genetic testing for hereditary cancer risks. Patient's maternal ancestors are of Creek Indian/Caucasian descent, and paternal ancestors are of African American/Caucasian descent. There is no reported Ashkenazi Jewish ancestry. There is no known consanguinity.  GENETIC  COUNSELING ASSESSMENT: MARKEDA NARVAEZ is a 62 y.o. female with a personal and family history which is somewhat suggestive of a Hereditary Cancer Predisposition Syndrome. We, therefore, discussed and recommended the following at today's visit.   DISCUSSION: We discussed that about 5-10% of breast cancer cases are hereditary with most cases due to BRCA mutations.  Other genes associated with hereditary breast cancer cases include ATM, CHEK2 and PALB2.  We reviewed the characteristics, features and inheritance  patterns of hereditary cancer syndromes.  We also discussed genetic testing, including the appropriate family members to test, the process of testing, insurance coverage and turn-around-time for results.  We typically order DNA testing that performs next generation sequencing (NGS) and sanger sequencing to look for mutations.  RNA testing has been known to find mutations deep within the intronic regions or areas that we are not testing with NGS.  It is suggested that bout 1 in 43 individual will be impacted by RNA testing.  Therefore we recommend doing this type of testing for her. We discussed the implications of a negative, positive and/or variant of uncertain significant result. We recommended Ms. Werk pursue genetic testing for the [facility] CancerNext-Expanded+RNA insight panel.  The CancerNext-Expanded gene panel offered by [facility] and includes sequencing and rearrangement analysis for the following 49 genes: APC, ATM, BAP1, BARD1, BMPR1A, BRCA1, BRCA2, BRIP1, CDH1, CDK4, CDKN2A, CHEK2, FH, FLCN, MAX, MEN1, MET, MLH1, MRE11A, MSH2, MSH6, MUTYH, NBN, NF1, PALB2, PMS2, POLD1, POLE, PTEN, RAD50, RAD51D, RET, SDHA, SDHAF2, SDHB, SDHC, SDHD, SMAD4, SMARCA4, STK11, TMEM127, TP53, TSC1, TSC2, and VHL.  We discussed that if she is found to have a mutation in one of these genes, it may impact surgical decisions, and alter future medical management recommendations such as increased cancer screenings and  consideration of risk reducing surgeries.  A positive result could also have implications for the patient's family members.  A Negative result would mean we were unable to identify a hereditary component to her cancer, but does not rule out the possibility of a hereditary basis for her cancer.  There could be mutations that are undetectable by current technology, or in genes not yet tested or identified to increase cancer risk.    We discussed the potential to find a Variant of Uncertain Significance or VUS.  These are variants that have not yet been identified as pathogenic or benign, and it is unknown if this variant is associated with increased cancer risk or if this is a normal finding.  Most VUS's are reclassified to benign or likely benign.   It should not be used to make medical management decisions. With time, we suspect the lab will determine the significance of any VUS's identified if any.   Based on Ms. Sutliff's personal and family history of cancer, she meets NCCN medical criteria for genetic testing. Despite that she meets criteria, she may still have an out of pocket cost. We discussed that if her out of pocket cost for testing is over $100, the laboratory will call and confirm whether she wants to proceed with testing.  If the out of pocket cost of testing is less than $100 she will be billed by the genetic testing laboratory.   PLAN: After considering the risks, benefits, and limitations, Ms. Shankel  provided informed consent to pursue genetic testing and the blood sample was sent to [facility] for analysis of the CancerNext-Expanded + RNA insight. Results should be available within approximately 2-3 weeks' time, at which point they will be disclosed by telephone to Ms. Leonhard, as will any additional recommendations warranted by these results. Ms. Esters will receive a summary of her genetic counseling visit and a copy of her results once available. This information will also be available  in Epic.   Based on Ms. Petion's family history, we recommended her maternal cousins have genetic counseling and testing. Ms. Lorenzi will let us know if we can be of any assistance in coordinating genetic counseling and/or testing  for this family member.   Lastly, we encouraged Ms. Saulsbury to remain in contact with cancer genetics annually so that we can continuously update the family history and inform her of any changes in cancer genetics and testing that may be of benefit for this family.   Ms.  Kyte questions were answered to her satisfaction today. Our contact information was provided should additional questions or concerns arise. Thank you for the referral and allowing [place] to share in the care of your patient.   Faith Rogue, MS Genetic Counselor [email or web address] Phone: [phone]   The patient was seen for a total of 50 minutes in face-to-face genetic counseling.  The patient was accompanied today by her sister, Diane. This patient was discussed with Drs. Magrinat, Lindi Adie and/or Burr Medico who agrees with the above.

## 2018-02-09 ENCOUNTER — Other Ambulatory Visit: Payer: Self-pay

## 2018-02-09 ENCOUNTER — Encounter: Payer: Self-pay | Admitting: *Deleted

## 2018-02-09 ENCOUNTER — Ambulatory Visit
Admission: RE | Admit: 2018-02-09 | Discharge: 2018-02-09 | Disposition: A | Discharge status: Home / Self Care | Payer: Managed Care, Other (non HMO) | Source: Ambulatory Visit | Attending: Radiation Oncology | Admitting: Radiation Oncology

## 2018-02-09 ENCOUNTER — Encounter: Payer: Self-pay | Admitting: Radiation Oncology

## 2018-02-09 ENCOUNTER — Inpatient Hospital Stay: Payer: Managed Care, Other (non HMO)

## 2018-02-09 VITALS — BP 137/69 | HR 70 | Temp 97.8°F | Resp 20 | Ht 65.0 in | Wt 189.8 lb

## 2018-02-09 DIAGNOSIS — Z171 Estrogen receptor negative status [ER-]: Secondary | ICD-10-CM | POA: Insufficient documentation

## 2018-02-09 DIAGNOSIS — C50412 Malignant neoplasm of upper-outer quadrant of left female breast: Secondary | ICD-10-CM | POA: Insufficient documentation

## 2018-02-09 DIAGNOSIS — Z79899 Other long term (current) drug therapy: Secondary | ICD-10-CM | POA: Insufficient documentation

## 2018-02-09 MED FILL — DEXAMETHASONE 4 MG TABLET: 4 | 12 days supply | Qty: 12 | Fill #0

## 2018-02-09 MED FILL — LIDOCAINE-PRILOCAINE CREAM: 2.5-2.5 | 15 days supply | Qty: 30 | Fill #0

## 2018-02-09 NOTE — Progress Notes (Signed)
[facility] [id number], Understanding and Predicting Breast Cancer Events after Treatment.  Met with patient and her sister for 20 minutes today before her chemotherapy education appointment.  Patient states she did not have a chance to read the study consent form provided to her yesterday, but she is interested in reviewing it with research nurse today.  Reviewed consent form with patient in it's entirety, explaining purpose of the study and possible risks and benefits.  Outlined the study activities and timeline with patient.  Reminded patient that participation is voluntary. Patient verbalized understanding. She denies claustrophobia or any implanted devices, stating she has had MRI in the past without difficulty.  Reviewed patients schedule with her and informed if patient were to participate in this study she would need to come to clinic to sign consent form before any study activities.  Patient is scheduled to start chemotherapy on 03/19/18, but says she is going to ask Dr. Lindi Adie if she can wait to start until after the beginning of the year.  Patient states she is concerned she is missing too much work right now and will not be taking medical leave until after the beginning of the year.  Offered to ask Dr. Lindi Adie about this for her, but she said she will call herself. Patient agreed to think about this study and she says she is interested in participating if it works with her work schedule.  Patient agreed for research nurse to call her in 1 to 2 weeks to follow up on her schedule and interest in this study.  Encouraged patient to call research nurse if any questions or concerns prior to my phone call.  Patient still has my business card and a copy of the consent and hippa forms.  Thanked patient for her time today and look forward to seeing her tomorrow.  Foye Spurling, BSN, RN Clinical Research Nurse 02/09/2018 11:05 AM

## 2018-02-09 NOTE — Progress Notes (Signed)
Radiation Oncology         ([phone]) [phone] ________________________________ Initial Outpatient Consultation  Name: Christy Serrano MRN: [id number]  Date: 02/09/2018  DOB: August 26, 1955  CC:Velna Hatchet, MD  Jovita Kussmaul, MD   REFERRING PHYSICIAN: [name], MD  DIAGNOSIS:    ICD-10-CM   1. Malignant neoplasm of upper-outer quadrant of left breast in female, estrogen receptor negative ([place]) C50.412    Z17.1   Cancer Staging Malignant neoplasm of upper-outer quadrant of left breast in female, estrogen receptor negative ([place]) Staging form: Breast, AJCC 8th Edition - Clinical: Stage IIIA (cT3, cN1, cM0, G3, ER-, PR-, HER2+) - Signed by Nicholas Lose, MD on 02/03/2018 T2 vs T3, N1, M0 Clinical stage  CHIEF COMPLAINT: Here to discuss management of left breast cancer  HISTORY OF PRESENT ILLNESS::Christy Serrano is a 62 y.o. female who presented with left breast calcifications and possible asymmetry on the following imaging: Bilateral screening mammogram on the date of 12/31/17.  Symptoms, if any, at that time, were: none new.   Diagnostic mammogram and ultrasound of the left breast on 01/11/18 revealed a suspicious mass at the 2:00 position of the left breast, 7 cm from the nipple, measuring 1.2 x 1.3 x 1.7 cm. There was an indeterminate 8 mm mass at the 1:30 position of the left breast, 4 cm from the nipple. There was a segmental group of suspicious microcalcifications over the left UOQ, spanning 1.5 x 4.3 x 5.4 cm. There were also three abnormal left axillary lymph nodes.  Biopsy of the 2:00 position of the left breast on date of 01/18/18 showed invasive mammary carcinoma with lymphovascular invasion; ER negative, PR negative, Her2 positive, Grade 2-3. Biopsy of the 1:30 position of the left breast revealed fibroadenoma. Biopsy of 1 left axillary lymph node revealed metastatic carcinoma.  Biopsy of calcifications in the left UOQ on date of 01/25/18 revealed grade 3 invasive ductal  carcinoma, measuring 0.2 cm, with ductal carcinoma in situ and lymphovascular space invasion; ER negative, PR negative, Her2 positive.  She met with Dr. Lindi Adie on 02/03/18 for treatment recommendations as follows: 1.Neoadjuvant chemotherapy with TCH Perjeta followed by Herceptin Perjeta maintenance versus Kadcyla maintenance 2.Possible mastectomy with targeted node dissection 3.Adjuvant radiation therapy  Bilateral breast MRI on 02/06/18 showed a 2.5 cm spiculated mass in the UOQ of the left breast corresponding to the biopsy-proven invasive ductal carcinoma, ductal carcinoma in situ, and lymphovascular invasion. There was a concerning nodular enhancement extending 5 cm anterior to the known malignancy. There was also enlarged left axillary adenopathy corresponding with the known metastatic disease.  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 She reports that she has lost about 10 pounds recently, intentionally, while on a wellness program. Staging scans are pending.  PREVIOUS RADIATION THERAPY: No  PAST MEDICAL HISTORY:  has a past medical history of Family history of breast cancer, Family history of ovarian cancer, Family history of prostate cancer, and Lactose intolerance.    PAST SURGICAL HISTORY: Past Surgical History:  Procedure Laterality Date  . ABDOMINAL HYSTERECTOMY    . ROTATOR CUFF REPAIR    . TOTAL ABDOMINAL HYSTERECTOMY      FAMILY HISTORY: family history includes Breast cancer in her paternal aunt; Breast cancer (age of onset: 58) in her sister; Breast cancer (age of onset: 78) in her sister; Cancer in her paternal grandmother and paternal uncle; High blood pressure  in her father and mother; Ovarian cancer in her maternal grandmother; Prostate cancer in her father.  SOCIAL HISTORY:  reports that she has never smoked. She has never used smokeless tobacco. She reports that she  does not drink alcohol or use drugs.  ALLERGIES: Oxycodone-acetaminophen and Amoxicillin  MEDICATIONS:  Current Outpatient Medications  Medication Sig Dispense Refill  . albuterol (PROVENTIL HFA;VENTOLIN HFA) 108 (90 Base) MCG/ACT inhaler Inhale into the lungs every 6 (six) hours as needed for wheezing or shortness of breath.    Marland Kitchen aspirin 81 MG chewable tablet Chew by mouth daily.    . Calcium-Phosphorus-Vitamin D (CITRACAL +D3 PO) Take 1,200 mcg by mouth.    . cholecalciferol (VITAMIN D) 1000 units tablet Take 1,000 Units by mouth daily.    Marland Kitchen estradiol (VIVELLE-DOT) 0.075 MG/24HR     . ibuprofen (ADVIL,MOTRIN) 200 MG tablet Take 200 mg by mouth every 6 (six) hours as needed.    . lidocaine-prilocaine (EMLA) cream Apply to affected area once 30 g 3  . Multiple Vitamin (MULTIVITAMIN) capsule Take 1 capsule by mouth daily.    Marland Kitchen dexamethasone (DECADRON) 4 MG tablet Take 1 tablet (4 mg total) by mouth daily. Take 1 tablet day before chemo and 1 tablet day after with food (Patient not taking: Reported on 02/09/2018) 12 tablet 0  . LORazepam (ATIVAN) 0.5 MG tablet Take 1 tablet (0.5 mg total) by mouth at bedtime as needed (Nausea or vomiting). (Patient not taking: Reported on 02/09/2018) 30 tablet 0  . ondansetron (ZOFRAN) 8 MG tablet Take 1 tablet (8 mg total) by mouth 2 (two) times daily as needed (Nausea or vomiting). Begin 4 days after chemotherapy. (Patient not taking: Reported on 02/09/2018) 30 tablet 1  . prochlorperazine (COMPAZINE) 10 MG tablet Take 1 tablet (10 mg total) by mouth every 6 (six) hours as needed (Nausea or vomiting). (Patient not taking: Reported on 02/09/2018) 30 tablet 1   No current facility-administered medications for this encounter.     REVIEW OF SYSTEMS: A 10+ POINT REVIEW OF SYSTEMS WAS OBTAINED including neurology, dermatology, psychiatry, cardiac, respiratory, lymph, extremities, GI, GU, Musculoskeletal, constitutional, breasts, reproductive, HEENT.  All pertinent  positives are noted in the HPI.  All others are negative.   PHYSICAL EXAM:  height is '5\' 5"'$  (1.651 m) and weight is 189 lb 12.8 oz (86.1 kg). Her oral temperature is 97.8 F (36.6 C). Her blood pressure is 137/69 and her pulse is 70. Her respiration is 20 and oxygen saturation is 100%.   General: Alert and oriented, in no acute distress. HEENT: Head is normocephalic. Extraocular movements are intact. Oropharynx is clear. Neck: Neck is supple, no palpable cervical or supraclavicular lymphadenopathy. Heart: Regular in rate and rhythm with no murmurs, rubs, or gallops. Chest: Clear to auscultation bilaterally, with no rhonchi, wheezes, or rales. Abdomen: Soft, nontender, nondistended, with no rigidity or guarding. Extremities: No cyanosis or edema. Lymphatics: see Neck Exam Skin: No concerning lesions. Musculoskeletal: Symmetric strength and muscle tone throughout. Neurologic: Cranial nerves II through XII are grossly intact. No obvious focalities. Speech is fluent. Coordination is intact. Psychiatric: Judgment and insight are intact. Affect is appropriate. Breasts: She has a 2 cm mass in the UOQ of the left breast. No other palpable masses appreciated in the breasts or axillae bilaterally   ECOG = 0  0 - Asymptomatic (Fully active, able to carry on all predisease activities without restriction)  1 - Symptomatic but completely ambulatory (Restricted in physically strenuous activity but ambulatory and able  to carry out work of a light or sedentary nature. For example, light housework, office work)  2 - Symptomatic, <50% in bed during the day (Ambulatory and capable of all self care but unable to carry out any work activities. Up and about more than 50% of waking hours)  3 - Symptomatic, >50% in bed, but not bedbound (Capable of only limited self-care, confined to bed or chair 50% or more of waking hours)  4 - Bedbound (Completely disabled. Cannot carry on any self-care. Totally confined to  bed or chair)  5 - Death   Eustace Pen MM, Creech RH, Tormey DC, et al. [phone]). "Toxicity and response criteria of the [facility] Group". [place] Oncol. 5 (6): 649-55   LABORATORY DATA:  Lab Results  Component Value Date   WBC 5.3 12/22/2017   HGB 12.3 12/22/2017   HCT 38.2 12/22/2017   MCV 92 12/22/2017   PLT 145 (L) 07/18/2008   CMP     Component Value Date/Time   NA 138 12/22/2017 1235   K 4.2 12/22/2017 1235   CL 99 12/22/2017 1235   CO2 24 12/22/2017 1235   GLUCOSE 81 12/22/2017 1235   GLUCOSE 87 07/17/2008 0811   BUN 12 12/22/2017 1235   CREATININE 0.92 12/22/2017 1235   CALCIUM 9.1 12/22/2017 1235   PROT 7.6 12/22/2017 1235   ALBUMIN 4.8 12/22/2017 1235   AST 18 12/22/2017 1235   ALT 12 12/22/2017 1235   ALKPHOS 67 12/22/2017 1235   BILITOT 0.7 12/22/2017 1235   GFRNONAA 68 12/22/2017 1235   GFRAA 78 12/22/2017 1235         RADIOGRAPHY: Mr Breast Bilateral W Wo Contrast Inc Cad  Result Date: 02/08/2018 CLINICAL DATA:  Patent had an ultrasound-guided core biopsy of a mass in the 2 o'clock region of the left breast (coil clip) showing invasive ductal carcinoma, ductal carcinoma in situ and lymphovascular space invasion. Patient had an ultrasound-guided core biopsy of the 1:30 region of the left breast (ribbon shaped clip) which was a fibroadenoma. Patient had a left axillary lymph node biopsied which was positive for metastatic carcinoma. Patient had a stereotactic biopsy of the upper-outer quadrant (X shaped clip) of the left breast showing invasive ductal carcinoma, ductal carcinoma in situ and lymphovascular invasion. LABS:  None obtained at the time of imaging. EXAM: BILATERAL BREAST MRI WITH AND WITHOUT CONTRAST TECHNIQUE: Multiplanar, multisequence MR images of both breasts were obtained prior to and following the intravenous administration of 8 ml of Gadavist Three-dimensional MR images were rendered by post-processing of the original MR data on  an independent workstation. The three-dimensional MR images were interpreted, and findings are reported in the following complete MRI report for this study. Three dimensional images were evaluated at the independent DynaCad workstation COMPARISON:  Previous exam(s). FINDINGS: Breast composition: c. Heterogeneous fibroglandular tissue. Background parenchymal enhancement: Moderate. Right breast: No mass or abnormal enhancement Left breast: In the posterior third of the upper-outer quadrant of the left breast there is a spiculated enhancing mass measuring 2.5 x 1.7 x 2.0 cm. There is concerning nodular enhancement extending anterior to the mass. 5 cm anterior to the mass is a 6 mm nodule (series number 9, image 93/144). 2 cm anterior to the enhancing mass is a 5 mm nodule (series number 9, image 75/144). Lymph nodes: There are enlarged left axillary lymph nodes. The largest lymph node measures 2.9 cm and has a signal void artifact corresponding to the biopsy clip. Ancillary findings:  None. IMPRESSION: 2.5 cm spiculated mass in the upper-outer quadrant of the left breast corresponding to the biopsy proven invasive ductal carcinoma, ductal carcinoma in situ and lymphovascular invasion. Concerning nodular enhancement extending 5 cm anterior to the known malignancy. Enlarged left axillary adenopathy corresponding with the known metastatic disease. RECOMMENDATION: MR guided core biopsies of 2 nodules extending anterior to the biopsy proven malignancy is recommended. BI-RADS CATEGORY  4: Suspicious. Electronically Signed   By: Lillia Mountain M.D.   On: 02/08/2018 11:30   US Breast Ltd Uni Left Inc Axilla  Result Date: 01/11/2018 CLINICAL DATA:  Patient presents for additional views of the left breast as follow-up from a screening exam to evaluate microcalcifications and possible asymmetry. EXAM: DIGITAL DIAGNOSTIC left MAMMOGRAM WITH TOMO ULTRASOUND left BREAST COMPARISON:  Previous exam(s). ACR Breast Density Category c:  The breast tissue is heterogeneously dense, which may obscure small masses. FINDINGS: Exam demonstrates an irregular mass over the posterior third of the left upper outer quadrant. There is a segmental distribution of heterogeneous microcalcifications over the left upper outer quadrant spanning 1.5 x 4.3 x 5.4 cm. Spot compression MLO view demonstrates a suspicious left axillary lymph node. Targeted ultrasound is performed, showing a hypoechoic mass with irregular shape and borders as well as internal microcalcifications at the 2 o'clock position 7 cm from the nipple measuring 1.2 x 1.3 x 1.7 cm. This correlates with the mammographic abnormality. There is an oval irregular hypoechoic mass at the 1:30 position of the left breast 4 cm from the nipple measuring 3 x 7 x 8 mm not seen sonographically. This mass is 2.2 cm from the previously described mass. Ultrasound of the left axilla demonstrates 3 abnormal axillary nodes with the most abnormal having cortical thickness of 8-9 mm. IMPRESSION: 1. Suspicious mass at 2 o'clock position of the left breast 7 cm from the nipple measuring 1.2 x 1.3 x 1.7 cm concerning for invasive mammary carcinoma. 2. Indeterminate 8 mm mass at the 1:30 position of the left breast 4 cm from the nipple. 3. Segmental group of suspicious microcalcifications over the left upper outer quadrant spanning 1.5 x 4.3 x 5.4 cm. 4.  Three abnormal left axillary lymph nodes. RECOMMENDATION: 1. Recommend ultrasound-guided core needle biopsy of the masses at the 1:30 and 2 o'clock position as well as 1 of the abnormal left axillary lymph nodes. 2. Recommend stereotactic core needle biopsy of the suspicious microcalcifications in the left upper outer quadrant which could be performed on a separate day. I have discussed the findings and recommendations with the patient. Results were also provided in writing at the conclusion of the visit. If applicable, a reminder letter will be sent to the patient regarding  the next appointment. BI-RADS CATEGORY  5: Highly suggestive of malignancy. Biopsy to be scheduled here at the [facility]. Electronically Signed   By: Marin Olp M.D.   On: 01/11/2018 17:06   Mm Diag Breast Tomo Uni Left  Result Date: 01/11/2018 CLINICAL DATA:  Patient presents for additional views of the left breast as follow-up from a screening exam to evaluate microcalcifications and possible asymmetry. EXAM: DIGITAL DIAGNOSTIC left MAMMOGRAM WITH TOMO ULTRASOUND left BREAST COMPARISON:  Previous exam(s). ACR Breast Density Category c: The breast tissue is heterogeneously dense, which may obscure small masses. FINDINGS: Exam demonstrates an irregular mass over the posterior third of the left upper outer quadrant. There is a segmental distribution of heterogeneous microcalcifications over the left upper outer quadrant spanning 1.5 x 4.3  x 5.4 cm. Spot compression MLO view demonstrates a suspicious left axillary lymph node. Targeted ultrasound is performed, showing a hypoechoic mass with irregular shape and borders as well as internal microcalcifications at the 2 o'clock position 7 cm from the nipple measuring 1.2 x 1.3 x 1.7 cm. This correlates with the mammographic abnormality. There is an oval irregular hypoechoic mass at the 1:30 position of the left breast 4 cm from the nipple measuring 3 x 7 x 8 mm not seen sonographically. This mass is 2.2 cm from the previously described mass. Ultrasound of the left axilla demonstrates 3 abnormal axillary nodes with the most abnormal having cortical thickness of 8-9 mm. IMPRESSION: 1. Suspicious mass at 2 o'clock position of the left breast 7 cm from the nipple measuring 1.2 x 1.3 x 1.7 cm concerning for invasive mammary carcinoma. 2. Indeterminate 8 mm mass at the 1:30 position of the left breast 4 cm from the nipple. 3. Segmental group of suspicious microcalcifications over the left upper outer quadrant spanning 1.5 x 4.3 x 5.4 cm. 4.  Three  abnormal left axillary lymph nodes. RECOMMENDATION: 1. Recommend ultrasound-guided core needle biopsy of the masses at the 1:30 and 2 o'clock position as well as 1 of the abnormal left axillary lymph nodes. 2. Recommend stereotactic core needle biopsy of the suspicious microcalcifications in the left upper outer quadrant which could be performed on a separate day. I have discussed the findings and recommendations with the patient. Results were also provided in writing at the conclusion of the visit. If applicable, a reminder letter will be sent to the patient regarding the next appointment. BI-RADS CATEGORY  5: Highly suggestive of malignancy. Biopsy to be scheduled here at the [facility]. Electronically Signed   By: Marin Olp M.D.   On: 01/11/2018 17:06   [place] Axillary Node Core Biopsy Left  Addendum Date: 01/20/2018   ADDENDUM REPORT: 01/19/2018 14:34 ADDENDUM: Pathology revealed FIBROADENOMA of the Left breast, 1:30 o'clock. This was found to be concordant by Dr. Lillia Mountain. 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 This was found to be concordant by Dr. Lillia Mountain. Pathology results were discussed with the patient by telephone by Deland Pretty, Breast Health Navigator, [facility]. The patient reported doing well after the biopsy with tenderness at the site. Post biopsy instructions and care were reviewed and questions were answered. A surgical referral will be arranged by Deland Pretty. Imaging and pathology reports were faxed to [facility] on January 19, 2018. The patient is scheduled for an additional Left breast stereotatic guided biopsy at [facility] on January 25, 2018. Pathology results reported by Terie Purser, RN on 01/19/2018. Electronically Signed   By: Lillia Mountain M.D.   On: 01/19/2018 14:34   Result Date:  01/20/2018 CLINICAL DATA:  Suspicious left breast masses and left axillary lymph node. EXAM: ULTRASOUND-GUIDED CORE BIOPSIES OF 2 MASSES IN THE LEFT BREAST AND A LEFT AXILLARY LYMPH NODE. COMPARISON:  Previous exam(s). FINDINGS: I met with the patient and we discussed the procedure of ultrasound-guided biopsy, including benefits and alternatives. We discussed the high likelihood of a successful procedure. We discussed the risks of the procedure, including infection, bleeding, tissue injury, clip migration, and inadequate sampling. Informed written consent was given. The usual time-out protocol was performed immediately prior to the procedure. Quadrant: Upper-outer quadrant Using sterile technique and 1%  lidocaine and 1% lidocaine with epinephrine as local anesthetic, under direct ultrasound visualization, a 14 gauge spring-loaded device was used to perform biopsy of a mass in the 1:30 region of the left breast using a lateral to medial approach. At the conclusion of the procedure a ribbon shaped tissue marker clip was deployed into the biopsy cavity. Follow up 2 view mammogram was performed and dictated separately. I met with the patient and we discussed the procedure of ultrasound-guided biopsy, including benefits and alternatives. We discussed the high likelihood of a successful procedure. We discussed the risks of the procedure, including infection, bleeding, tissue injury, clip migration, and inadequate sampling. Informed written consent was given. The usual time-out protocol was performed immediately prior to the procedure. Quadrant: Upper-outer quadrant Using sterile technique and 1% lidocaine and 1% lidocaine with epinephrine as local anesthetic, under direct ultrasound visualization, a 14 gauge spring-loaded device was used to perform biopsy of a mass in the 2 o'clock region of the left breast using a lateral to medial approach. At the conclusion of the procedure a coil shaped tissue marker clip was  deployed into the biopsy cavity. Follow up 2 view mammogram was performed and dictated separately. I met with the patient and we discussed the procedure of ultrasound-guided biopsy, including benefits and alternatives. We discussed the high likelihood of a successful procedure. We discussed the risks of the procedure, including infection, bleeding, tissue injury, clip migration, and inadequate sampling. Informed written consent was given. The usual time-out protocol was performed immediately prior to the procedure. Quadrant: Left axilla Using sterile technique and 1% lidocaine and 1% lidocaine with epinephrine as local anesthetic, under direct ultrasound visualization, a 14 gauge spring-loaded device was used to perform biopsy of a left axillary lymph node using a lateral to medial approach. At the conclusion of the procedure a HydroMARK tissue marker clip was deployed into the biopsy cavity. Follow up 2 view mammogram was performed and dictated separately. IMPRESSION: Ultrasound-guided core biopsies of 2 masses in the left breast and left axilla. No apparent complications. Electronically Signed: By: Lillia Mountain M.D. On: 01/18/2018 16:07   Mm Clip Placement Left  Result Date: 01/25/2018 CLINICAL DATA:  Status post stereotactic guided core biopsy of LEFT breast calcifications. EXAM: DIAGNOSTIC LEFT MAMMOGRAM POST STEREOTACTIC BIOPSY COMPARISON:  Previous exam(s). FINDINGS: Mammographic images were obtained following stereotactic guided biopsy of calcifications in the UPPER-OUTER QUADRANT of the LEFT breast. An X shaped clip is 1 centimeter anterior to the biopsied calcifications. IMPRESSION: X shaped clip is 1 centimeter anterior to the biopsy site. Final Assessment: Post Procedure Mammograms for Marker Placement Electronically Signed   By: Nolon Nations M.D.   On: 01/25/2018 09:24   Mm Clip Placement Left  Result Date: 01/18/2018 CLINICAL DATA:  Status post ultrasound-guided core biopsies of 2 masses in  the left breast and a left axillary lymph node. EXAM: DIAGNOSTIC LEFT MAMMOGRAM POST ULTRASOUND BIOPSIES COMPARISON:  Previous exam(s). FINDINGS: Mammographic images were obtained following ultrasound guided biopsies of 2 masses in the left breast and a left axillary lymph node. Mammographic images show there is a ribbon shaped clip in the 1 o'clock region of the left breast and a coil shaped clip in the 2 o'clock region of the left breast. There is a HydroMARK clip in a left axillary lymph node. IMPRESSION: Status post ultrasound-guided core biopsies of 2 masses in the left breast and a left axillary lymph node with pathology pending. Final Assessment: Post Procedure Mammograms for Marker Placement Electronically Signed  By: Lillia Mountain M.D.   On: 01/18/2018 16:09   Mm Lt Breast Bx W Loc Dev 1st Lesion Image Bx Spec Stereo Guide  Addendum Date: 01/26/2018   ADDENDUM REPORT: 01/26/2018 13:46 ADDENDUM: Pathology revealed GRADE III INVASIVE DUCTAL CARCINOMA, DUCTAL CARCINOMA IN SITU, LYMPHOVASCULAR SPACE INVASION PRESENT of the Left breast, left upper outer quadrant, X clip. This was found to be concordant by Dr. Nolon Nations. Pathology results were discussed with the patient by telephone by Deland Pretty, Breast health Navigator at [facility] [place]. The patient reported doing well after the biopsy with tenderness at the site. Post biopsy instructions and care were reviewed and questions were answered. The patient has a recent diagnosis of left breast cancer and should follow her outlined treatment plan. Imaging and pathology reports were faxed to [facility] on January 26, 2018. Pathology results reported by Terie Purser, RN on 01/26/2018. Electronically Signed   By: Nolon Nations M.D.   On: 01/26/2018 13:46   Result Date: 01/26/2018 CLINICAL DATA:  Patient presents for stereotactic guided core biopsy of calcifications in the UPPER-OUTER QUADRANT of the LEFT breast. EXAM: LEFT BREAST  STEREOTACTIC CORE NEEDLE BIOPSY COMPARISON:  Previous exams. FINDINGS: The patient and I discussed the procedure of stereotactic-guided biopsy including benefits and alternatives. We discussed the high likelihood of a successful procedure. We discussed the risks of the procedure including infection, bleeding, tissue injury, clip migration, and inadequate sampling. Informed written consent was given. The usual time out protocol was performed immediately prior to the procedure. Using sterile technique and 1% Lidocaine as local anesthetic, under stereotactic guidance, a 9 gauge vacuum assisted device was used to perform core needle biopsy of calcifications in the UPPER-OUTER QUADRANT of the LEFT breast using a last superior to inferior approach. Specimen radiograph was performed showing calcifications in numerous sample. Specimens with calcifications are identified for pathology. Lesion quadrant: UPPER-OUTER QUADRANT LEFT breast At the conclusion of the procedure, an X shaped tissue marker clip was deployed into the biopsy cavity. Follow-up 2-view mammogram was performed and dictated separately. IMPRESSION: Stereotactic-guided biopsy of LEFT breast calcifications. No apparent complications. Electronically Signed: By: Nolon Nations M.D. 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 This was found to be concordant by Dr. Lillia Mountain. 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 This was found to be concordant by Dr. Lillia Mountain. Pathology results were discussed with the patient by telephone by Deland Pretty, Breast Health Navigator, [facility]. The patient reported doing well after the  biopsy with tenderness at the site. Post biopsy instructions and care were reviewed and questions were answered. A surgical referral will be arranged by Deland Pretty. Imaging and pathology reports were faxed to [facility] on January 19, 2018. The patient is scheduled for an additional Left breast stereotatic guided biopsy at [facility] on January 25, 2018. Pathology results reported by Terie Purser, RN on 01/19/2018. Electronically Signed   By: Lillia Mountain M.D.   On: 01/19/2018 14:34   Result Date: 01/20/2018 CLINICAL DATA:  Suspicious left breast masses and left axillary lymph node. EXAM: ULTRASOUND-GUIDED CORE BIOPSIES OF 2 MASSES IN THE LEFT BREAST AND A LEFT AXILLARY LYMPH NODE. COMPARISON:  Previous exam(s). FINDINGS: I met with the patient and we discussed the procedure of ultrasound-guided biopsy, including benefits and alternatives. We discussed the high likelihood of a successful procedure. We discussed the risks of the procedure, including infection, bleeding, tissue injury, clip migration, and inadequate sampling. Informed written consent was given. The usual time-out protocol was performed immediately prior to the procedure. Quadrant: Upper-outer quadrant Using sterile technique and 1% lidocaine and 1% lidocaine with epinephrine as local anesthetic, under direct ultrasound visualization, a 14 gauge spring-loaded device was used to perform biopsy of a mass in the 1:30 region of the left breast using a lateral to medial approach. At the conclusion of the procedure a ribbon shaped tissue marker clip was deployed into the biopsy cavity. Follow up 2 view mammogram was performed and dictated separately. I met with the patient and we discussed the procedure of ultrasound-guided biopsy, including benefits and alternatives. We discussed the high likelihood of a successful procedure. We discussed the risks of the procedure, including infection, bleeding, tissue injury, clip migration, and inadequate  sampling. Informed written consent was given. The usual time-out protocol was performed immediately prior to the procedure. Quadrant: Upper-outer quadrant Using sterile technique and 1% lidocaine and 1% lidocaine with epinephrine as local anesthetic, under direct ultrasound visualization, a 14 gauge spring-loaded device was used to perform biopsy of a mass in the 2 o'clock region of the left breast using a lateral to medial approach. At the conclusion of the procedure a coil shaped tissue marker clip was deployed into the biopsy cavity. Follow up 2 view mammogram was performed and dictated separately. I met with the patient and we discussed the procedure of ultrasound-guided biopsy, including benefits and alternatives. We discussed the high likelihood of a successful procedure. We discussed the risks of the procedure, including infection, bleeding, tissue injury, clip migration, and inadequate sampling. Informed written consent was given. The usual time-out protocol was performed immediately prior to the procedure. Quadrant: Left axilla Using sterile technique and 1% lidocaine and 1% lidocaine with epinephrine as local anesthetic, under direct ultrasound visualization, a 14 gauge spring-loaded device was used to perform biopsy of a left axillary lymph node using a lateral to medial approach. At the conclusion of the procedure a HydroMARK tissue marker clip was deployed into the biopsy cavity. Follow up 2 view mammogram was performed and dictated separately. IMPRESSION: Ultrasound-guided core biopsies of 2 masses in the left breast and left axilla. No apparent complications. Electronically Signed: By: Lillia Mountain M.D. 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 This was found to be concordant by Dr. Lillia Mountain. Pathology revealed GRADE  II-III INVASIVE MAMMARY CARCINOMA, LYMPHOVASCULAR INVASION IS IDENTIFIED of the Left breast, 2 o'clock. 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 This was found to be concordant by Dr. Lillia Mountain. Pathology results were discussed with the patient by telephone by Deland Pretty, Breast Health Navigator, [facility]. The patient reported doing well after the biopsy with tenderness at the site. Post biopsy instructions and care were reviewed and questions were answered. A surgical referral will be arranged by Deland Pretty. Imaging and pathology reports were faxed to [facility] on January 19, 2018. The patient is scheduled for an additional Left breast stereotatic guided biopsy at [facility] on January 25, 2018. Pathology  results reported by Terie Purser, RN on 01/19/2018. Electronically Signed   By: Lillia Mountain M.D.   On: 01/19/2018 14:34   Result Date: 01/20/2018 CLINICAL DATA:  Suspicious left breast masses and left axillary lymph node. EXAM: ULTRASOUND-GUIDED CORE BIOPSIES OF 2 MASSES IN THE LEFT BREAST AND A LEFT AXILLARY LYMPH NODE. COMPARISON:  Previous exam(s). FINDINGS: I met with the patient and we discussed the procedure of ultrasound-guided biopsy, including benefits and alternatives. We discussed the high likelihood of a successful procedure. We discussed the risks of the procedure, including infection, bleeding, tissue injury, clip migration, and inadequate sampling. Informed written consent was given. The usual time-out protocol was performed immediately prior to the procedure. Quadrant: Upper-outer quadrant Using sterile technique and 1% lidocaine and 1% lidocaine with epinephrine as local anesthetic, under direct ultrasound visualization, a 14 gauge spring-loaded device was used to perform biopsy of a mass in the 1:30 region of the left breast using a lateral to medial approach. At the conclusion of the procedure a ribbon shaped tissue marker clip was deployed into  the biopsy cavity. Follow up 2 view mammogram was performed and dictated separately. I met with the patient and we discussed the procedure of ultrasound-guided biopsy, including benefits and alternatives. We discussed the high likelihood of a successful procedure. We discussed the risks of the procedure, including infection, bleeding, tissue injury, clip migration, and inadequate sampling. Informed written consent was given. The usual time-out protocol was performed immediately prior to the procedure. Quadrant: Upper-outer quadrant Using sterile technique and 1% lidocaine and 1% lidocaine with epinephrine as local anesthetic, under direct ultrasound visualization, a 14 gauge spring-loaded device was used to perform biopsy of a mass in the 2 o'clock region of the left breast using a lateral to medial approach. At the conclusion of the procedure a coil shaped tissue marker clip was deployed into the biopsy cavity. Follow up 2 view mammogram was performed and dictated separately. I met with the patient and we discussed the procedure of ultrasound-guided biopsy, including benefits and alternatives. We discussed the high likelihood of a successful procedure. We discussed the risks of the procedure, including infection, bleeding, tissue injury, clip migration, and inadequate sampling. Informed written consent was given. The usual time-out protocol was performed immediately prior to the procedure. Quadrant: Left axilla Using sterile technique and 1% lidocaine and 1% lidocaine with epinephrine as local anesthetic, under direct ultrasound visualization, a 14 gauge spring-loaded device was used to perform biopsy of a left axillary lymph node using a lateral to medial approach. At the conclusion of the procedure a HydroMARK tissue marker clip was deployed into the biopsy cavity. Follow up 2 view mammogram was performed and dictated separately. IMPRESSION: Ultrasound-guided core biopsies of 2 masses in the left breast and left  axilla. No apparent complications. Electronically Signed: By: Lillia Mountain M.D. On: 01/18/2018 16:07      IMPRESSION/PLAN: Left Breast Cancer  It was a pleasure meeting the patient and her sister today. At this time she will proceed with staging scans and neoadjuvant chemotherapy followed by surgery. We discussed the risks, benefits, and side effects of radiotherapy. I recommend radiotherapy to the left breast or chest wall and regional nodes to reduce her risk of locoregional recurrence by 2/3.  We discussed that radiation would take approximately 6 weeks to complete and that I would give the patient a few weeks to heal following surgery before starting treatment planning. We spoke about acute effects including skin irritation and fatigue as well  as much less common late effects including internal organ injury or irritation. We spoke about the latest technology that is used to minimize the risk of late effects for patients undergoing radiotherapy to the breast or chest wall. No guarantees of treatment were given. The patient is enthusiastic about proceeding with treatment. I look forward to participating in the patient's care.  I will await her referral back to me for postoperative follow-up and eventual CT simulation/treatment planning.   __________________________________________   Eppie Gibson, MD  This document serves as a record of services personally performed by Eppie Gibson, MD. It was created on her behalf by Rae Lips, a trained medical scribe. The creation of this record is based on the scribe's personal observations and the provider's statements to them. This document has been checked and approved by the attending provider.

## 2018-02-10 ENCOUNTER — Telehealth: Payer: Self-pay | Admitting: Hematology and Oncology

## 2018-02-10 NOTE — Telephone Encounter (Signed)
R/s appt per 11/19 sch message - pt is aware of appt date and time   

## 2018-02-11 ENCOUNTER — Other Ambulatory Visit: Payer: Self-pay

## 2018-02-11 ENCOUNTER — Other Ambulatory Visit (HOSPITAL_COMMUNITY): Payer: Managed Care, Other (non HMO)

## 2018-02-11 ENCOUNTER — Telehealth: Payer: Self-pay

## 2018-02-11 DIAGNOSIS — Z171 Estrogen receptor negative status [ER-]: Principal | ICD-10-CM

## 2018-02-11 DIAGNOSIS — C50412 Malignant neoplasm of upper-outer quadrant of left female breast: Secondary | ICD-10-CM

## 2018-02-11 NOTE — Telephone Encounter (Signed)
Patient returned call.  Lab appointment scheduled prior to CT scan.  Patient aware and voiced understanding.  No further needs at this time.

## 2018-02-11 NOTE — Telephone Encounter (Signed)
Left message for patient to return call regarding scheduling lab appointment prior to CT scan.

## 2018-02-12 ENCOUNTER — Other Ambulatory Visit: Payer: Self-pay | Admitting: General Surgery

## 2018-02-12 ENCOUNTER — Encounter: Payer: Self-pay | Admitting: Hematology and Oncology

## 2018-02-12 DIAGNOSIS — N6320 Unspecified lump in the left breast, unspecified quadrant: Secondary | ICD-10-CM

## 2018-02-15 ENCOUNTER — Encounter (HOSPITAL_COMMUNITY): Payer: Managed Care, Other (non HMO)

## 2018-02-15 ENCOUNTER — Ambulatory Visit (HOSPITAL_COMMUNITY): Admission: RE | Admit: 2018-02-15 | Payer: Managed Care, Other (non HMO) | Source: Ambulatory Visit

## 2018-02-15 ENCOUNTER — Other Ambulatory Visit (HOSPITAL_COMMUNITY): Payer: Managed Care, Other (non HMO)

## 2018-02-16 ENCOUNTER — Encounter: Payer: Self-pay | Admitting: Oncology

## 2018-02-16 ENCOUNTER — Other Ambulatory Visit: Payer: Self-pay

## 2018-02-16 ENCOUNTER — Telehealth: Payer: Self-pay | Admitting: *Deleted

## 2018-02-16 ENCOUNTER — Encounter (HOSPITAL_BASED_OUTPATIENT_CLINIC_OR_DEPARTMENT_OTHER): Payer: Self-pay | Admitting: *Deleted

## 2018-02-16 NOTE — Telephone Encounter (Signed)
Called patient to follow up on her interest in the [place] since her schedule has changed. Patient is now starting treatment on 03/04/18.  Patient states she has not had a chance to think about the study.  Informed patient if she wishes to participate then it looks like she would need to complete study activities including cardiac MRI, physical functions and neurocognitive testing on 03/03/18.  Explained we need to wait for her staging scans to completed on 12/10 but before she starts treatment on 12/12.  Patient states she is scheduled for [facility] placement on 12/11 so she would not have time to come in for other study activities.  Thanked patient for her time and discussing this study with research nurse.  Informed patient of the DCP-001 study which is presented to all patients who were screened for any NCI studies, such as the [employer] study.  Explained to patient this is a one time collection of medical and demographic information to try to better understand the clinical trial participation process.  Participation is voluntary and will take about 20 minutes. This includes reviewing and signing consent form followed by answering study questions. No information collected will be use to personally identify patient.  Asked patient if it is ok for research nurse to visit her in infusion room during her treatment to see if she is willing to participate in this study.  Patient asked that nurse not come on her first chemo visit but states she might be willing to do it during another visit.  Gave patient my name and number again and encouraged her to call or ask for me if she decides she wants to enroll on this study anytime in the next month.  Patient verbalized understanding.  Thanked patient again for her time. Foye Spurling, BSN, RN Clinical Research Nurse 02/16/2018 10:10 AM

## 2018-02-22 ENCOUNTER — Ambulatory Visit (INDEPENDENT_AMBULATORY_CARE_PROVIDER_SITE_OTHER): Payer: Managed Care, Other (non HMO) | Admitting: Bariatrics

## 2018-02-22 ENCOUNTER — Encounter (INDEPENDENT_AMBULATORY_CARE_PROVIDER_SITE_OTHER): Payer: Self-pay | Admitting: Bariatrics

## 2018-02-22 VITALS — BP 136/77 | HR 69 | Temp 97.9°F | Ht 65.0 in | Wt 184.0 lb

## 2018-02-22 DIAGNOSIS — Z6830 Body mass index (BMI) 30.0-30.9, adult: Secondary | ICD-10-CM | POA: Diagnosis not present

## 2018-02-22 DIAGNOSIS — R7303 Prediabetes: Secondary | ICD-10-CM | POA: Diagnosis not present

## 2018-02-22 DIAGNOSIS — E669 Obesity, unspecified: Secondary | ICD-10-CM | POA: Diagnosis not present

## 2018-02-23 ENCOUNTER — Encounter: Payer: Self-pay | Admitting: Licensed Clinical Social Worker

## 2018-02-23 ENCOUNTER — Ambulatory Visit: Payer: Self-pay | Admitting: Licensed Clinical Social Worker

## 2018-02-23 ENCOUNTER — Telehealth: Payer: Self-pay | Admitting: Licensed Clinical Social Worker

## 2018-02-23 DIAGNOSIS — Z1379 Encounter for other screening for genetic and chromosomal anomalies: Secondary | ICD-10-CM | POA: Insufficient documentation

## 2018-02-23 NOTE — Telephone Encounter (Signed)
Revealed negative genetic testing. We discussed that we do not know why she has breast cancer or why there is cancer in the family. It could be due to a different gene that we are not testing, or something our current technology cannot pick up.  It will be important for her to keep in contact with genetics to learn if additional testing may be needed in the future. We discussed her maternal relatives could benefit from genetic counseling/testing as well as her paternal aunt with breast cancer history.

## 2018-02-23 NOTE — Progress Notes (Signed)
HPI:  Christy Serrano was previously seen in the [facility] clinic on 02/08/2018 due to a personal and family history of breast cancer and concerns regarding a hereditary predisposition to cancer. Please refer to our prior cancer genetics clinic note for more information regarding Christy Serrano's medical, social and family histories, and our assessment and recommendations, at the time. Christy Serrano recent genetic test results were disclosed to her, as well as recommendations warranted by these results. These results and recommendations are discussed in more detail below.  CANCER HISTORY:    Malignant neoplasm of upper-outer quadrant of left breast in female, estrogen receptor negative ([place])   01/18/2018 Initial Diagnosis    Screening mammogram detected microcalcifications and asymmetry, left breast segmental microcalcifications UOQ 1.5 x 4.3 x 5.4 cm biopsy-proven IDC with DCIS with LV I, grade 3, ER 0%, PR 0%, Ki-67 30%, HER-2 +3+ by IHC; hypoechoic mass 2 o'clock position 1.2 x 1.3 x 1.7 cm biopsy IDC, LV I present, grade 2-3, ER 0%, PR 0%, Ki-67 40%, HER-2 +3+, left axillary lymph node positive (3 nodes noted by [place]) T3 N1 stage IIIA    02/03/2018 Cancer Staging    Staging form: Breast, AJCC 8th Edition - Clinical: Stage IIIA (cT3, cN1, cM0, G3, ER-, PR-, HER2+) - Signed by Nicholas Lose, MD on 02/03/2018    02/08/2018 -  Chemotherapy    The patient had palonosetron (ALOXI) injection 0.25 mg, 0.25 mg, Intravenous,  Once, 0 of 6 cycles pegfilgrastim-cbqv (UDENYCA) injection 6 mg, 6 mg, Subcutaneous, Once, 0 of 6 cycles trastuzumab (HERCEPTIN) 693 mg in sodium chloride 0.9 % 250 mL chemo infusion, 8 mg/kg = 693 mg, Intravenous,  Once, 0 of 6 cycles CARBOplatin (PARAPLATIN) 700 mg in sodium chloride 0.9 % 250 mL chemo infusion, 700 mg (100 % of original dose 700 mg), Intravenous,  Once, 0 of 6 cycles Dose modification: 700 mg (original dose 700 mg, Cycle 1) DOCEtaxel (TAXOTERE) 150 mg in  sodium chloride 0.9 % 250 mL chemo infusion, 75 mg/m2 = 150 mg, Intravenous,  Once, 0 of 6 cycles pertuzumab (PERJETA) 840 mg in sodium chloride 0.9 % 250 mL chemo infusion, 840 mg, Intravenous, Once, 0 of 6 cycles fosaprepitant (EMEND) 150 mg, dexamethasone (DECADRON) 12 mg in sodium chloride 0.9 % 145 mL IVPB, , Intravenous,  Once, 0 of 6 cycles  for chemotherapy treatment.     02/20/2018 Genetic Testing    No pathogenic variants identified on the [facility] CancerNext Expanded + RNA insight panel. Genes Analyzed (67 total): AIP, ALK, APC*, ATM*, BAP1, BARD1, BLM, BMPR1A, BRCA1*, BRCA2*, BRIP1*, CDH1*, CDK4, CDKN1B, CDKN2A, CHEK2*, DICER1, FANCC, FH, FLCN, GALNT12, HOXB13, MAX, MEN1, MET, MLH1*, MRE11A, MSH2*, MSH6*, MUTYH*, NBN, NF1*, NF2, PALB2*, PHOX2B, PMS2*, POLD1, POLE, POT1, PRKAR1A, PTCH1, PTEN*, RAD50, RAD51C*, RAD51D*, RB1, RET, SDHA, SDHAF2, SDHB, SDHC, SDHD, SMAD4, SMARCA4, SMARCB1, SMARCE1, STK11, SUFU, TMEM127, TP53*, TSC1, TSC2, VHL and XRCC2 (sequencing and deletion/duplication); MITF (sequencing only); EPCAM and GREM1 (deletion/duplication only). DNA and RNA analyses performed for * genes. The report date is 02/20/2018.      FAMILY HISTORY:  We obtained a detailed, 4-generation family history.  Significant diagnoses are listed below: Family History  Problem Relation Age of Onset  . High blood pressure Mother   . High blood pressure Father   . Prostate cancer Father        dx mid 68s  . Breast cancer Sister 32       negative [employer] 46 gene panel  .  Breast cancer Paternal Aunt        dx mid 76s  . Cancer Paternal Uncle        jaw cancer d. 30s  . Ovarian cancer Maternal Grandmother        d. 66s  . Cancer Paternal Grandmother        either stomach or ovarian cancer  . Breast cancer Sister 42       "negative 21 gene panel"    Christy Serrano does not have children. She has two sisters. One of her sisters, Christy Serrano, was diagnosed with breast cancer a year ago at age 2. She had  negative genetic testing through [employer]'s 46 gene panel which was available for review during the session. Her other sister, Christy Serrano also had breast cancer, diagnosed at 79 and had negative genetic testing on a 21 gene panel, report not available during the session. ChristySerrano does not have nieces or nephews.  Christy Serrano father had prostate cancer diagnosed in his 58's treated with prostatectomy and radiation, he is living at 66. He had 11 siblings. One of his sisters, Christy Serrano's aunt, had breast cancer diagnosed in her mid 20's, and she is living at 46. The patient doesn't know if she had genetic testing. One of Christy Serrano's uncles had jaw cancer and died in his 62's. A paternal cousin also had cancer, although Christy Serrano does not know what type. The patient's paternal grandmother had cancer, either stomach or ovarian, and died at 40. Paternal grandfather died at 30.  Christy Serrano mother is living at 92 and was diagnosed with breast cancer one week before Christy Serrano's diagnosis. She had one brother who has passed away at 26, and a half sister through her father. No cancers for these individuals, or for Christy Serrano's maternal cousins. Christy Serrano maternal grandmother had ovarian cancer in her 37's and died in her 51's. Christy Serrano does not have information about her maternal grandfather.  Christy Serrano is aware of previous family history of genetic testing for hereditary cancer risks. Patient's maternal ancestors are of Creek Indian/Caucasian descent, and paternal ancestors are of African American/Caucasian descent. There is no reported Ashkenazi Jewish ancestry. There is no known consanguinity.  GENETIC TEST RESULTS: Genetic testing performed through [facility]'s CancerNext-Expanded + RNAinsight reported out on 02/20/2018 showed no pathogenic mutations. Genes Analyzed (67 total): AIP, ALK, APC*, ATM*, BAP1, BARD1, BLM, BMPR1A, BRCA1*, BRCA2*, BRIP1*, CDH1*, CDK4, CDKN1B, CDKN2A, CHEK2*, DICER1, FANCC, FH, FLCN, GALNT12,  HOXB13, MAX, MEN1, MET, MLH1*, MRE11A, MSH2*, MSH6*, MUTYH*, NBN, NF1*, NF2, PALB2*, PHOX2B, PMS2*, POLD1, POLE, POT1, PRKAR1A, PTCH1, PTEN*, RAD50, RAD51C*, RAD51D*, RB1, RET, SDHA,SDHAF2, SDHB, SDHC, SDHD, SMAD4, SMARCA4, SMARCB1, SMARCE1, STK11, SUFU, TMEM127, TP53*, TSC1, TSC2, VHL and XRCC2 (sequencing and deletion/duplication); MITF (sequencing only); EPCAM and GREM1 (deletion/duplication only). DNA and RNA analyses performed for * genes.  The test report will be scanned into EPIC and will be located under the Molecular Pathology section of the Results Review tab. A portion of the result report is included below for reference.     We discussed with Ms. Matchett that because current genetic testing is not perfect, it is possible there may be a gene mutation in one of these genes that current testing cannot detect, but that chance is small.  We also discussed, that there could be another gene that has not yet been discovered, or that we have not yet tested, that is responsible for the cancer diagnoses in the family. It is also possible there is  a hereditary cause for the cancer in the family that Ms. First did not inherit and therefore was not identified in her testing.  Therefore, it is important to remain in touch with cancer genetics in the future so that we can continue to offer Ms. Toothman the most up to date genetic testing.   ADDITIONAL GENETIC TESTING: We discussed with Ms. Cieslik that her genetic testing was fairly extensive.  If there are are genes identified to increase cancer risk that can be analyzed in the future, we would be happy to discuss and coordinate this testing at that time.    CANCER SCREENING RECOMMENDATIONS: Ms. Kalina test result is considered negative (normal).  This means that we have not identified a hereditary cause for her personal and family history of cancer at this time.   While reassuring, this does not definitively rule out a hereditary predisposition to cancer. It is  still possible that there could be genetic mutations that are undetectable by current technology, or genetic mutations in genes that have not been tested or identified to increase cancer risk.  Therefore, it is recommended she continue to follow the cancer management and screening guidelines provided by her oncology and primary healthcare provider. An individual's cancer risk is not determined by genetic test results alone.  Overall cancer risk assessment includes additional factors such as personal medical history, family history, etc.  These should be used to make a personalized plan for cancer prevention and surveillance.    RECOMMENDATIONS FOR FAMILY MEMBERS:  Relatives in this family might be at some increased risk of developing cancer, over the general population risk, simply due to the family history of cancer.  We recommended women in this family have a yearly mammogram beginning at age 65, or 64 years younger than the earliest onset of cancer, an annual clinical breast exam, and perform monthly breast self-exams. Women in this family should also have a gynecological exam as recommended by their primary provider. All family members should have a colonoscopy as directed by their doctors.  All family members should inform their physicians about the family history of cancer so their doctors can make the most appropriate screening recommendations for them.   It is also possible there is a hereditary cause for the cancer in Ms. Ohagan's family that she did not inherit and therefore was not identified in her.  We recommended her maternal relatives in general and paternal aunt with breast cancer history have genetic counseling and testing. Ms. Asher will let us know if we can be of any assistance in coordinating genetic counseling and/or testing for these family members.   FOLLOW-UP: Lastly, we discussed with Ms. Piedra that cancer genetics is a rapidly advancing field and it is possible that new genetic tests  will be appropriate for her and/or her family members in the future. We encouraged her to remain in contact with cancer genetics on an annual basis so we can update her personal and family histories and let her know of advances in cancer genetics that may benefit this family.   Our contact number was provided. Ms. Griego questions were answered to her satisfaction, and she knows she is welcome to call us at anytime with additional questions or concerns.  Faith Rogue, MS Genetic Counselor [email or web address] Phone: [phone]

## 2018-02-24 NOTE — Progress Notes (Signed)
Office: [phone]  /  Fax: [phone]   HPI:   Chief Complaint: OBESITY Christy Serrano is here to discuss her progress with her obesity treatment plan. She is on the Category 3 plan and is following her eating plan approximately 100 % of the time. She states she is exercising 0 minutes 0 times per week. Christy Serrano did reasonably well during Thanksgiving. She is eating more low carbohydrates.  Her weight is 184 lb (83.5 kg) today and has had a weight loss of 2 pounds over a period of 3 weeks since her last visit. She has lost 11 lbs since starting treatment with [place].  Pre-Diabetes Christy Serrano has a diagnosis of pre-diabetes based on her elevated Hgb A1c at 6.1 and insulin at 13.7 on 12/22/17, and was informed this puts her at greater risk of developing diabetes. She is not taking metformin currently and continues to work on diet and exercise to decrease risk of diabetes. She denies polyphagia.  ALLERGIES: Allergies  Allergen Reactions  . Oxycodone-Acetaminophen Other (See Comments) and Palpitations    tachycardia   . Amoxicillin Rash    MEDICATIONS: Current Outpatient Medications on File Prior to Visit  Medication Sig Dispense Refill  . albuterol (PROVENTIL HFA;VENTOLIN HFA) 108 (90 Base) MCG/ACT inhaler Inhale into the lungs every 6 (six) hours as needed for wheezing or shortness of breath.    Marland Kitchen aspirin 81 MG chewable tablet Chew by mouth daily.    . Calcium-Phosphorus-Vitamin D (CITRACAL +D3 PO) Take 1,200 mcg by mouth.    . cholecalciferol (VITAMIN D) 1000 units tablet Take 1,000 Units by mouth daily.    Marland Kitchen dexamethasone (DECADRON) 4 MG tablet Take 1 tablet (4 mg total) by mouth daily. Take 1 tablet day before chemo and 1 tablet day after with food 12 tablet 0  . estradiol (VIVELLE-DOT) 0.075 MG/24HR     . ibuprofen (ADVIL,MOTRIN) 200 MG tablet Take 200 mg by mouth every 6 (six) hours as needed.    . lidocaine-prilocaine (EMLA) cream Apply to affected area once 30 g 3  . LORazepam  (ATIVAN) 0.5 MG tablet Take 1 tablet (0.5 mg total) by mouth at bedtime as needed (Nausea or vomiting). 30 tablet 0  . Multiple Vitamin (MULTIVITAMIN) capsule Take 1 capsule by mouth daily.    . ondansetron (ZOFRAN) 8 MG tablet Take 1 tablet (8 mg total) by mouth 2 (two) times daily as needed (Nausea or vomiting). Begin 4 days after chemotherapy. 30 tablet 1  . prochlorperazine (COMPAZINE) 10 MG tablet Take 1 tablet (10 mg total) by mouth every 6 (six) hours as needed (Nausea or vomiting). 30 tablet 1   No current facility-administered medications on file prior to visit.     PAST MEDICAL HISTORY: Past Medical History:  Diagnosis Date  . Family history of breast cancer   . Family history of ovarian cancer   . Family history of prostate cancer   . Lactose intolerance   . PONV (postoperative nausea and vomiting)     PAST SURGICAL HISTORY: Past Surgical History:  Procedure Laterality Date  . ABDOMINAL HYSTERECTOMY    . ROTATOR CUFF REPAIR    . TOTAL ABDOMINAL HYSTERECTOMY      SOCIAL HISTORY: Social History   Tobacco Use  . Smoking status: Never Smoker  . Smokeless tobacco: Never Used  Substance Use Topics  . Alcohol use: No  . Drug use: No    FAMILY HISTORY: Family History  Problem Relation Age of Onset  . High blood pressure Mother   .  High blood pressure Father   . Prostate cancer Father        dx mid 58s  . Breast cancer Sister 23       negative Invitae 46 gene panel  . Breast cancer Paternal Aunt        dx mid 11s  . Cancer Paternal Uncle        jaw cancer d. 83s  . Ovarian cancer Maternal Grandmother        d. 58s  . Cancer Paternal Grandmother        either stomach or ovarian cancer  . Breast cancer Sister 52       "negative 21 gene panel"    ROS: Review of Systems  Constitutional: Positive for weight loss.  Endo/Heme/Allergies:       Negative polyphagia    PHYSICAL EXAM: Blood pressure 136/77, pulse 69, temperature 97.9 F (36.6 C), temperature  source Oral, height 5\' 5"  (1.651 m), weight 184 lb (83.5 kg), SpO2 98 %. Body mass index is 30.62 kg/m. Physical Exam  Constitutional: She is oriented to person, place, and time. She appears well-developed and well-nourished.  Cardiovascular: Normal rate.  Pulmonary/Chest: Effort normal.  Musculoskeletal: Normal range of motion.  Neurological: She is oriented to person, place, and time.  Skin: Skin is warm and dry.  Psychiatric: She has a normal mood and affect. Her behavior is normal.  Vitals reviewed.   RECENT LABS AND TESTS: BMET    Component Value Date/Time   NA 138 12/22/2017 1235   K 4.2 12/22/2017 1235   CL 99 12/22/2017 1235   CO2 24 12/22/2017 1235   GLUCOSE 81 12/22/2017 1235   GLUCOSE 87 07/17/2008 0811   BUN 12 12/22/2017 1235   CREATININE 0.92 12/22/2017 1235   CALCIUM 9.1 12/22/2017 1235   GFRNONAA 68 12/22/2017 1235   GFRAA 78 12/22/2017 1235   Lab Results  Component Value Date   HGBA1C 6.1 (H) 12/22/2017   Lab Results  Component Value Date   INSULIN 13.7 12/22/2017   CBC    Component Value Date/Time   WBC 5.3 12/22/2017 1235   WBC 10.1 07/18/2008 0525   RBC 4.15 12/22/2017 1235   RBC 3.13 (L) 07/18/2008 0525   HGB 12.3 12/22/2017 1235   HCT 38.2 12/22/2017 1235   PLT 145 (L) 07/18/2008 0525   MCV 92 12/22/2017 1235   MCH 29.6 12/22/2017 1235   MCHC 32.2 12/22/2017 1235   MCHC 34.4 07/18/2008 0525   RDW 14.8 12/22/2017 1235   LYMPHSABS 2.0 12/22/2017 1235   EOSABS 0.0 12/22/2017 1235   BASOSABS 0.0 12/22/2017 1235   Iron/TIBC/Ferritin/ %Sat No results found for: IRON, TIBC, FERRITIN, IRONPCTSAT Lipid Panel     Component Value Date/Time   CHOL 176 12/22/2017 1235   TRIG 111 12/22/2017 1235   HDL 61 12/22/2017 1235   LDLCALC 93 12/22/2017 1235   Hepatic Function Panel     Component Value Date/Time   PROT 7.6 12/22/2017 1235   ALBUMIN 4.8 12/22/2017 1235   AST 18 12/22/2017 1235   ALT 12 12/22/2017 1235   ALKPHOS 67 12/22/2017 1235    BILITOT 0.7 12/22/2017 1235      Component Value Date/Time   TSH 1.930 12/22/2017 1235    ASSESSMENT AND PLAN: Prediabetes  Class 1 obesity with serious comorbidity and body mass index (BMI) of 30.0 to 30.9 in adult, unspecified obesity type  PLAN:  Pre-Diabetes Christy Serrano will continue to work on weight loss, diet, exercise, increasing  protein, and decreasing simple carbohydrates in her diet to help decrease the risk of diabetes. We dicussed metformin including benefits and risks. She was informed that eating too many simple carbohydrates or too many calories at one sitting increases the likelihood of GI side effects. Christy Serrano declined metformin for now and a prescription was not written today. Christy Serrano agrees to follow up with our clinic in 3 weeks as directed to monitor her progress.  I spent > than 50% of the 15 minute visit on counseling as documented in the note.  Obesity Dalicia is currently in the action stage of change. As such, her goal is to continue with weight loss efforts She has agreed to follow the Category 3 plan Christy Serrano has been instructed to work up to a goal of 150 minutes of combined cardio and strengthening exercise per week for weight loss and overall health benefits. We discussed the following Behavioral Modification Strategies today: increasing lean protein intake, decreasing simple carbohydrates, increasing vegetables, work on meal planning and easy cooking plans, ways to avoid night time snacking, increase H20 intake, no skipping meals, and better snacking choices Christy Serrano is to increase meal planning, and recipe handouts were given.  Christy Serrano has agreed to follow up with our clinic in 3 weeks. She was informed of the importance of frequent follow up visits to maximize her success with intensive lifestyle modifications for her multiple health conditions.   OBESITY BEHAVIORAL INTERVENTION VISIT  Today's visit was # 5   Starting weight: 195 lbs Starting  date: 12/22/17 Today's weight : 184 lbs  Today's date: 02/22/2018 Total lbs lost to date: 13    ASK: We discussed the diagnosis of obesity with Christy Serrano today and Christy Serrano agreed to give [place] permission to discuss obesity behavioral modification therapy today.  ASSESS: Christy Serrano has the diagnosis of obesity and her BMI today is 30.62 Christy Serrano is in the action stage of change   ADVISE: Christy Serrano was educated on the multiple health risks of obesity as well as the benefit of weight loss to improve her health. She was advised of the need for long term treatment and the importance of lifestyle modifications to improve her current health and to decrease her risk of future health problems.  AGREE: Multiple dietary modification options and treatment options were discussed and  Christy Serrano agreed to follow the recommendations documented in the above note.  ARRANGE: Christy Serrano was educated on the importance of frequent visits to treat obesity as outlined per CMS and USPSTF guidelines and agreed to schedule her next follow up appointment today.  Christy Serrano, am acting as transcriptionist for [employer], DO  I have reviewed the above documentation for accuracy and completeness, and I agree with the above. -Jearld Lesch, DO

## 2018-02-25 ENCOUNTER — Other Ambulatory Visit: Payer: Managed Care, Other (non HMO)

## 2018-02-26 ENCOUNTER — Ambulatory Visit: Payer: Managed Care, Other (non HMO) | Admitting: Hematology and Oncology

## 2018-03-01 ENCOUNTER — Ambulatory Visit
Admission: RE | Admit: 2018-03-01 | Discharge: 2018-03-01 | Disposition: A | Discharge status: Home / Self Care | Payer: Managed Care, Other (non HMO) | Source: Ambulatory Visit | Attending: General Surgery | Admitting: General Surgery

## 2018-03-01 DIAGNOSIS — N6320 Unspecified lump in the left breast, unspecified quadrant: Secondary | ICD-10-CM

## 2018-03-01 MED ORDER — GADOBUTROL 1 MMOL/ML IV SOLN
8.0000 mL | Freq: Once | INTRAVENOUS | Status: AC | PRN
  Administered 2018-03-01: 8 mL via INTRAVENOUS

## 2018-03-02 ENCOUNTER — Inpatient Hospital Stay: Payer: Managed Care, Other (non HMO) | Attending: Hematology and Oncology

## 2018-03-02 ENCOUNTER — Encounter (HOSPITAL_COMMUNITY)
Admission: RE | Admit: 2018-03-02 | Discharge: 2018-03-02 | Disposition: A | Discharge status: Home / Self Care | Payer: Managed Care, Other (non HMO) | Source: Ambulatory Visit | Attending: Hematology and Oncology | Admitting: Hematology and Oncology

## 2018-03-02 ENCOUNTER — Encounter (HOSPITAL_COMMUNITY): Payer: Self-pay

## 2018-03-02 ENCOUNTER — Ambulatory Visit (HOSPITAL_BASED_OUTPATIENT_CLINIC_OR_DEPARTMENT_OTHER)
Admission: RE | Admit: 2018-03-02 | Discharge: 2018-03-02 | Disposition: A | Discharge status: Home / Self Care | Payer: Managed Care, Other (non HMO) | Source: Ambulatory Visit | Attending: Hematology and Oncology | Admitting: Hematology and Oncology

## 2018-03-02 ENCOUNTER — Ambulatory Visit (HOSPITAL_COMMUNITY)
Admission: RE | Admit: 2018-03-02 | Discharge: 2018-03-02 | Disposition: A | Discharge status: Home / Self Care | Payer: Managed Care, Other (non HMO) | Source: Ambulatory Visit | Attending: Hematology and Oncology | Admitting: Hematology and Oncology

## 2018-03-02 DIAGNOSIS — Z171 Estrogen receptor negative status [ER-]: Secondary | ICD-10-CM | POA: Insufficient documentation

## 2018-03-02 DIAGNOSIS — E876 Hypokalemia: Secondary | ICD-10-CM | POA: Insufficient documentation

## 2018-03-02 DIAGNOSIS — K219 Gastro-esophageal reflux disease without esophagitis: Secondary | ICD-10-CM | POA: Insufficient documentation

## 2018-03-02 DIAGNOSIS — C50412 Malignant neoplasm of upper-outer quadrant of left female breast: Secondary | ICD-10-CM

## 2018-03-02 DIAGNOSIS — Z79899 Other long term (current) drug therapy: Secondary | ICD-10-CM | POA: Diagnosis not present

## 2018-03-02 DIAGNOSIS — Z7689 Persons encountering health services in other specified circumstances: Secondary | ICD-10-CM | POA: Insufficient documentation

## 2018-03-02 LAB — CMP (CANCER CENTER ONLY)
ALT: 20 U/L (ref 0–44)
ANION GAP: 8 (ref 5–15)
AST: 25 U/L (ref 15–41)
Albumin: 4 g/dL (ref 3.5–5.0)
Alkaline Phosphatase: 60 U/L (ref 38–126)
BILIRUBIN TOTAL: 0.6 mg/dL (ref 0.3–1.2)
BUN: 13 mg/dL (ref 6–20)
CALCIUM: 9.3 mg/dL (ref 8.9–10.3)
CO2: 27 mmol/L (ref 22–32)
CREATININE: 1.11 mg/dL — AB (ref 0.44–1.00)
Chloride: 105 mmol/L (ref 98–111)
GFR, EST NON AFRICAN AMERICAN: 54 mL/min — AB (ref >60)
Glucose, Bld: 88 mg/dL (ref 70–99)
Potassium: 4.1 mmol/L (ref 3.5–5.1)
SODIUM: 140 mmol/L (ref 135–145)
TOTAL PROTEIN: 7.5 g/dL (ref 6.5–8.1)

## 2018-03-02 MED ORDER — SODIUM CHLORIDE (PF) 0.9 % IJ SOLN
INTRAMUSCULAR | Status: AC
  Filled 2018-03-02: qty 50

## 2018-03-02 MED ORDER — TECHNETIUM TC 99M MEDRONATE IV KIT
20.0000 | PACK | Freq: Once | INTRAVENOUS | Status: AC | PRN
  Administered 2018-03-02: 20 via INTRAVENOUS

## 2018-03-02 MED ORDER — IOHEXOL 300 MG/ML  SOLN
100.0000 mL | Freq: Once | INTRAMUSCULAR | Status: AC | PRN
  Administered 2018-03-02: 100 mL via INTRAVENOUS

## 2018-03-02 NOTE — Progress Notes (Signed)
  Echocardiogram 2D Echocardiogram has been performed.  Christy Serrano 03/02/2018, 11:44 AM

## 2018-03-03 ENCOUNTER — Encounter (HOSPITAL_BASED_OUTPATIENT_CLINIC_OR_DEPARTMENT_OTHER): Payer: Self-pay | Admitting: Certified Registered"

## 2018-03-03 ENCOUNTER — Ambulatory Visit (HOSPITAL_COMMUNITY): Payer: Managed Care, Other (non HMO)

## 2018-03-03 ENCOUNTER — Ambulatory Visit (HOSPITAL_BASED_OUTPATIENT_CLINIC_OR_DEPARTMENT_OTHER): Payer: Managed Care, Other (non HMO) | Admitting: Certified Registered"

## 2018-03-03 ENCOUNTER — Other Ambulatory Visit: Payer: Self-pay

## 2018-03-03 ENCOUNTER — Ambulatory Visit (HOSPITAL_BASED_OUTPATIENT_CLINIC_OR_DEPARTMENT_OTHER)
Admission: RE | Admit: 2018-03-03 | Discharge: 2018-03-03 | Disposition: A | Discharge status: Home / Self Care | Payer: Managed Care, Other (non HMO) | Attending: General Surgery | Admitting: General Surgery

## 2018-03-03 ENCOUNTER — Encounter (HOSPITAL_BASED_OUTPATIENT_CLINIC_OR_DEPARTMENT_OTHER)
Admission: RE | Disposition: A | Discharge status: Home / Self Care | Payer: Self-pay | Source: Home / Self Care | Attending: General Surgery

## 2018-03-03 DIAGNOSIS — C50412 Malignant neoplasm of upper-outer quadrant of left female breast: Secondary | ICD-10-CM | POA: Insufficient documentation

## 2018-03-03 DIAGNOSIS — Z79899 Other long term (current) drug therapy: Secondary | ICD-10-CM | POA: Diagnosis not present

## 2018-03-03 DIAGNOSIS — Z853 Personal history of malignant neoplasm of breast: Secondary | ICD-10-CM | POA: Insufficient documentation

## 2018-03-03 DIAGNOSIS — Z171 Estrogen receptor negative status [ER-]: Secondary | ICD-10-CM | POA: Insufficient documentation

## 2018-03-03 DIAGNOSIS — Z79890 Hormone replacement therapy (postmenopausal): Secondary | ICD-10-CM | POA: Insufficient documentation

## 2018-03-03 DIAGNOSIS — Z7982 Long term (current) use of aspirin: Secondary | ICD-10-CM | POA: Insufficient documentation

## 2018-03-03 DIAGNOSIS — Z95828 Presence of other vascular implants and grafts: Secondary | ICD-10-CM

## 2018-03-03 HISTORY — DX: Nausea with vomiting, unspecified: R11.2

## 2018-03-03 HISTORY — PX: PORTACATH PLACEMENT: SHX2246

## 2018-03-03 HISTORY — DX: Other specified postprocedural states: Z98.890

## 2018-03-03 SURGERY — INSERTION, TUNNELED CENTRAL VENOUS DEVICE, WITH PORT
Anesthesia: General | Site: Chest | Laterality: Right

## 2018-03-03 MED ORDER — FENTANYL CITRATE (PF) 100 MCG/2ML IJ SOLN
50.0000 ug | INTRAMUSCULAR | Status: DC | PRN
  Administered 2018-03-03: 50 ug via INTRAVENOUS

## 2018-03-03 MED ORDER — SCOPOLAMINE 1 MG/3DAYS TD PT72
MEDICATED_PATCH | TRANSDERMAL | Status: AC
  Filled 2018-03-03: qty 1

## 2018-03-03 MED ORDER — ONDANSETRON HCL 4 MG/2ML IJ SOLN
INTRAMUSCULAR | Status: DC | PRN
  Administered 2018-03-03: 4 mg via INTRAVENOUS

## 2018-03-03 MED ORDER — LACTATED RINGERS IV SOLN
INTRAVENOUS | Status: DC
  Administered 2018-03-03 (×2): via INTRAVENOUS

## 2018-03-03 MED ORDER — CELECOXIB 200 MG PO CAPS
200.0000 mg | ORAL_CAPSULE | ORAL | Status: AC
  Administered 2018-03-03: 200 mg via ORAL

## 2018-03-03 MED ORDER — CELECOXIB 200 MG PO CAPS
ORAL_CAPSULE | ORAL | Status: AC
  Filled 2018-03-03: qty 1

## 2018-03-03 MED ORDER — CHLORHEXIDINE GLUCONATE CLOTH 2 % EX PADS: 6.0000 | MEDICATED_PAD | Freq: Once | CUTANEOUS | Status: DC

## 2018-03-03 MED ORDER — SCOPOLAMINE 1 MG/3DAYS TD PT72
1.0000 | MEDICATED_PATCH | Freq: Once | TRANSDERMAL | Status: DC | PRN
  Administered 2018-03-03: 1.5 mg via TRANSDERMAL

## 2018-03-03 MED ORDER — HEPARIN (PORCINE) IN NACL 2-0.9 UNITS/ML
INTRAMUSCULAR | Status: AC | PRN
  Administered 2018-03-03: 500 mL via INTRAVENOUS

## 2018-03-03 MED ORDER — CEFAZOLIN SODIUM-DEXTROSE 2-4 GM/100ML-% IV SOLN
2.0000 g | INTRAVENOUS | Status: AC
  Administered 2018-03-03: 2 g via INTRAVENOUS

## 2018-03-03 MED ORDER — CEFAZOLIN SODIUM-DEXTROSE 2-4 GM/100ML-% IV SOLN
INTRAVENOUS | Status: AC
  Filled 2018-03-03: qty 100

## 2018-03-03 MED ORDER — PROPOFOL 10 MG/ML IV BOLUS
INTRAVENOUS | Status: DC | PRN
  Administered 2018-03-03: 120 mg via INTRAVENOUS

## 2018-03-03 MED ORDER — TRAMADOL HCL 50 MG PO TABS: 50.0000 mg | ORAL_TABLET | Freq: Four times a day (QID) | ORAL | 0 refills | Status: DC | PRN

## 2018-03-03 MED ORDER — BUPIVACAINE-EPINEPHRINE (PF) 0.25% -1:200000 IJ SOLN
INTRAMUSCULAR | Status: AC
  Filled 2018-03-03: qty 60

## 2018-03-03 MED ORDER — HEPARIN SOD (PORK) LOCK FLUSH 100 UNIT/ML IV SOLN
INTRAVENOUS | Status: AC
  Filled 2018-03-03: qty 5

## 2018-03-03 MED ORDER — LIDOCAINE HCL (CARDIAC) PF 100 MG/5ML IV SOSY
PREFILLED_SYRINGE | INTRAVENOUS | Status: DC | PRN
  Administered 2018-03-03: 60 mg via INTRAVENOUS

## 2018-03-03 MED ORDER — PROPOFOL 500 MG/50ML IV EMUL
INTRAVENOUS | Status: DC | PRN
  Administered 2018-03-03: 125 ug/kg/min via INTRAVENOUS

## 2018-03-03 MED ORDER — BUPIVACAINE HCL (PF) 0.25 % IJ SOLN
INTRAMUSCULAR | Status: AC
  Filled 2018-03-03: qty 30

## 2018-03-03 MED ORDER — DEXAMETHASONE SODIUM PHOSPHATE 4 MG/ML IJ SOLN
INTRAMUSCULAR | Status: DC | PRN
  Administered 2018-03-03: 10 mg via INTRAVENOUS

## 2018-03-03 MED ORDER — HEPARIN SOD (PORK) LOCK FLUSH 100 UNIT/ML IV SOLN
INTRAVENOUS | Status: DC | PRN
  Administered 2018-03-03: 500 [IU] via INTRAVENOUS

## 2018-03-03 MED ORDER — HEPARIN (PORCINE) IN NACL 1000-0.9 UT/500ML-% IV SOLN
INTRAVENOUS | Status: AC
  Filled 2018-03-03: qty 500

## 2018-03-03 MED ORDER — ACETAMINOPHEN 500 MG PO TABS
1000.0000 mg | ORAL_TABLET | ORAL | Status: AC
  Administered 2018-03-03: 1000 mg via ORAL

## 2018-03-03 MED ORDER — MIDAZOLAM HCL 2 MG/2ML IJ SOLN
1.0000 mg | INTRAMUSCULAR | Status: DC | PRN
  Administered 2018-03-03: 2 mg via INTRAVENOUS

## 2018-03-03 MED ORDER — ACETAMINOPHEN 500 MG PO TABS
ORAL_TABLET | ORAL | Status: AC
  Filled 2018-03-03: qty 2

## 2018-03-03 MED ORDER — GABAPENTIN 300 MG PO CAPS
ORAL_CAPSULE | ORAL | Status: AC
  Filled 2018-03-03: qty 1

## 2018-03-03 MED ORDER — BUPIVACAINE HCL (PF) 0.25 % IJ SOLN
INTRAMUSCULAR | Status: DC | PRN
  Administered 2018-03-03: 6 mL

## 2018-03-03 MED ORDER — GABAPENTIN 300 MG PO CAPS
300.0000 mg | ORAL_CAPSULE | ORAL | Status: AC
  Administered 2018-03-03: 300 mg via ORAL

## 2018-03-03 MED FILL — traMADol HCL 50 MG TABS: 50 | 3 days supply | Qty: 20 | Fill #0

## 2018-03-03 SURGICAL SUPPLY — 50 items
ADH SKN CLS APL DERMABOND .7 (GAUZE/BANDAGES/DRESSINGS) ×1
BAG DECANTER FOR FLEXI CONT (MISCELLANEOUS) ×3 IMPLANT
BLADE SURG 15 STRL LF DISP TIS (BLADE) ×1 IMPLANT
BLADE SURG 15 STRL SS (BLADE) ×3
CANISTER SUCT 1200ML W/VALVE (MISCELLANEOUS) IMPLANT
CHLORAPREP W/TINT 26ML (MISCELLANEOUS) ×3 IMPLANT
CLEANER CAUTERY TIP 5X5 PAD (MISCELLANEOUS) ×1 IMPLANT
COVER BACK TABLE 60X90IN (DRAPES) ×3 IMPLANT
COVER MAYO STAND STRL (DRAPES) ×3 IMPLANT
COVER WAND RF STERILE (DRAPES) IMPLANT
DECANTER SPIKE VIAL GLASS SM (MISCELLANEOUS) IMPLANT
DERMABOND ADVANCED (GAUZE/BANDAGES/DRESSINGS) ×2
DERMABOND ADVANCED .7 DNX12 (GAUZE/BANDAGES/DRESSINGS) ×1 IMPLANT
DRAPE C-ARM 42X72 X-RAY (DRAPES) ×3 IMPLANT
DRAPE LAPAROSCOPIC ABDOMINAL (DRAPES) ×3 IMPLANT
DRAPE UTILITY XL STRL (DRAPES) ×3 IMPLANT
ELECT REM PT RETURN 9FT ADLT (ELECTROSURGICAL) ×3
ELECTRODE REM PT RTRN 9FT ADLT (ELECTROSURGICAL) ×1 IMPLANT
GLOVE BIO SURGEON STRL SZ 6.5 (GLOVE) ×1 IMPLANT
GLOVE BIO SURGEON STRL SZ7.5 (GLOVE) ×3 IMPLANT
GLOVE BIO SURGEONS STRL SZ 6.5 (GLOVE) ×1
GLOVE EXAM NITRILE MD LF STRL (GLOVE) ×2 IMPLANT
GOWN STRL REUS W/ TWL LRG LVL3 (GOWN DISPOSABLE) ×2 IMPLANT
GOWN STRL REUS W/TWL LRG LVL3 (GOWN DISPOSABLE) ×6
IV KIT MINILOC 20X1 SAFETY (NEEDLE) IMPLANT
KIT PORT POWER 8FR ISP CVUE (Port) ×2 IMPLANT
NDL HYPO 25X1 1.5 SAFETY (NEEDLE) ×1 IMPLANT
NDL SAFETY ECLIPSE 18X1.5 (NEEDLE) IMPLANT
NEEDLE HYPO 18GX1.5 SHARP (NEEDLE)
NEEDLE HYPO 22GX1.5 SAFETY (NEEDLE) IMPLANT
NEEDLE HYPO 25X1 1.5 SAFETY (NEEDLE) ×3 IMPLANT
NEEDLE SPNL 22GX3.5 QUINCKE BK (NEEDLE) IMPLANT
PACK BASIN DAY SURGERY FS (CUSTOM PROCEDURE TRAY) ×3 IMPLANT
PAD CLEANER CAUTERY TIP 5X5 (MISCELLANEOUS) ×2
PENCIL BUTTON HOLSTER BLD 10FT (ELECTRODE) ×3 IMPLANT
SLEEVE SCD COMPRESS KNEE MED (MISCELLANEOUS) ×2 IMPLANT
SUT MON AB 4-0 PC3 18 (SUTURE) ×3 IMPLANT
SUT PROLENE 2 0 SH DA (SUTURE) ×3 IMPLANT
SUT SILK 2 0 TIES 17X18 (SUTURE)
SUT SILK 2-0 18XBRD TIE BLK (SUTURE) IMPLANT
SUT VIC AB 3-0 SH 27 (SUTURE) ×3
SUT VIC AB 3-0 SH 27X BRD (SUTURE) ×1 IMPLANT
SYR 10ML LL (SYRINGE) ×2 IMPLANT
SYR 5ML LL (SYRINGE) ×3 IMPLANT
SYR CONTROL 10ML LL (SYRINGE) ×4 IMPLANT
TOWEL GREEN STERILE FF (TOWEL DISPOSABLE) ×4 IMPLANT
TOWEL OR NON WOVEN STRL DISP B (DISPOSABLE) IMPLANT
TUBE CONNECTING 20'X1/4 (TUBING)
TUBE CONNECTING 20X1/4 (TUBING) IMPLANT
YANKAUER SUCT BULB TIP NO VENT (SUCTIONS) IMPLANT

## 2018-03-03 NOTE — Anesthesia Postprocedure Evaluation (Signed)
Anesthesia Post Note  Patient: DULCY SIDA  Procedure(s) Performed: INSERTION PORT-A-CATH (Right Chest)     Patient location during evaluation: PACU Anesthesia Type: General Level of consciousness: awake and alert Pain management: pain level controlled Vital Signs Assessment: post-procedure vital signs reviewed and stable Respiratory status: spontaneous breathing, nonlabored ventilation, respiratory function stable and patient connected to nasal cannula oxygen Cardiovascular status: blood pressure returned to baseline and stable Postop Assessment: no apparent nausea or vomiting Anesthetic complications: no    Last Vitals:  Vitals:   03/03/18 1030 03/03/18 1050  BP: 134/87 (!) 145/71  Pulse: 60 60  Resp:  20  Temp:  36.5 C  SpO2: 100% 100%    Last Pain:  Vitals:   03/03/18 1050  TempSrc:   PainSc: 1                  Felice Deem L Shylo Zamor

## 2018-03-03 NOTE — Op Note (Signed)
03/03/2018  9:55 AM  PATIENT:  Christy Serrano  62 y.o. female  PRE-OPERATIVE DIAGNOSIS:  LEFT BREAST CANCER  POST-OPERATIVE DIAGNOSIS:  LEFT BREAST CANCER  PROCEDURE:  Procedure(s): INSERTION PORT-A-CATH (Right)  SURGEON:  Surgeon(s) and Role:    * Jovita Kussmaul, MD - Primary  PHYSICIAN ASSISTANT:   ASSISTANTS: none   ANESTHESIA:   local and general  EBL:  minimal   BLOOD ADMINISTERED:none  DRAINS: none   LOCAL MEDICATIONS USED:  MARCAINE     SPECIMEN:  No Specimen  DISPOSITION OF SPECIMEN:  N/A  COUNTS:  YES  TOURNIQUET:  * No tourniquets in log *  DICTATION: .Dragon Dictation   After informed consent was obtained the patient was brought to the operating room and placed in the supine position on the operating table.  After adequate induction of general anesthesia a roll was placed between the patient's shoulder blades to extend the shoulder slightly.  Next the right neck and chest wall area were prepped with ChloraPrep, allowed to dry, and draped in usual sterile manner.  The patient was placed in Trendelenburg position.  The area lateral to the bend of the clavicle on the right chest wall was infiltrated with quarter percent Marcaine.  A large bore needle from the Port-A-Cath kit was used to slide beneath the bend of the clavicle on the right chest wall heading towards the sternal notch and in doing so I was able to access the right subclavian vein without difficulty.  A wire was fed through the needle using the Seldinger technique without difficulty.  The wire was confirmed in the central venous system using real-time fluoroscopy.  Next a small incision was made at the wire entry site with a 15 blade knife.  The incision was carried through the skin and subcutaneous tissue sharply with the electrocautery.  A subcutaneous pocket was created inferior to the incision by blunt finger dissection.  Next the tubing was placed on the reservoir.  The reservoir was placed in the  pocket and the length of the tubing was again estimated using real-time fluoroscopy.  The tubing was cut to the appropriate length.  Next a sheath and dilator were fed over the wire using the Seldinger technique without difficulty.  The dilator and wire were removed from the patient.  The tubing was fed through the sheath as far as it would go and then held in place while the sheath was gently cracked and separated.  Another real-time fluoroscopy image showed the tip of the catheter to be in the distal superior vena cava.  The tubing was then permanently anchored to the reservoir.  The reservoir was anchored in the pocket with two 2-0 Prolene stitches.  The port was then aspirated and it aspirated blood easily.  The port was then flushed initially with a dilute heparin solution and then with a more concentrated heparin solution.  The subcutaneous tissue was closed over the port with interrupted 3-0 Vicryl stitches.  The skin was then closed with a running 4-0 Monocryl subcuticular stitch.  Dermabond dressings were applied.  The patient tolerated the procedure well.  At the end of the case all needle sponge and instrument counts were correct.  The patient was then awakened and taken to recovery in stable condition.  PLAN OF CARE: Discharge to home after PACU  PATIENT DISPOSITION:  PACU - hemodynamically stable.   Delay start of Pharmacological VTE agent (>24hrs) due to surgical blood loss or risk of bleeding: not applicable

## 2018-03-03 NOTE — Transfer of Care (Signed)
Immediate Anesthesia Transfer of Care Note  Patient: Christy Serrano  Procedure(s) Performed: INSERTION PORT-A-CATH (Right Chest)  Patient Location: PACU  Anesthesia Type:General  Level of Consciousness: awake, alert , oriented and patient cooperative  Airway & Oxygen Therapy: Patient Spontanous Breathing and Patient connected to face mask oxygen  Post-op Assessment: Report given to RN and Post -op Vital signs reviewed and stable  Post vital signs: Reviewed and stable  Last Vitals:  Vitals Value Taken Time  BP    Temp    Pulse    Resp    SpO2      Last Pain:  Vitals:   03/03/18 0850  TempSrc: Oral  PainSc: 0-No pain         Complications: No apparent anesthesia complications

## 2018-03-03 NOTE — Anesthesia Preprocedure Evaluation (Addendum)
Anesthesia Evaluation  Patient identified by MRN, date of birth, ID band Patient awake    Reviewed: Allergy & Precautions, NPO status , Patient's Chart, lab work & pertinent test results  History of Anesthesia Complications (+) PONV and history of anesthetic complications  Airway Mallampati: I  TM Distance: >3 FB Neck ROM: Full    Dental no notable dental hx. (+) Teeth Intact, Dental Advisory Given   Pulmonary asthma ,    Pulmonary exam normal breath sounds clear to auscultation       Cardiovascular negative cardio ROS Normal cardiovascular exam Rhythm:Regular Rate:Normal     Neuro/Psych negative neurological ROS  negative psych ROS   GI/Hepatic negative GI ROS, Neg liver ROS,   Endo/Other  negative endocrine ROS  Renal/GU negative Renal ROS  negative genitourinary   Musculoskeletal negative musculoskeletal ROS (+)   Abdominal   Peds  Hematology negative hematology ROS (+)   Anesthesia Other Findings Left breast cancer   Reproductive/Obstetrics                            Anesthesia Physical Anesthesia Plan  ASA: II  Anesthesia Plan: General   Post-op Pain Management:    Induction: Intravenous  PONV Risk Score and Plan: 4 or greater and Ondansetron, Dexamethasone, Midazolam, Scopolamine patch - Pre-op and TIVA  Airway Management Planned: LMA  Additional Equipment:   Intra-op Plan:   Post-operative Plan: Extubation in OR  Informed Consent: I have reviewed the patients History and Physical, chart, labs and discussed the procedure including the risks, benefits and alternatives for the proposed anesthesia with the patient or authorized representative who has indicated his/her understanding and acceptance.   Dental advisory given  Plan Discussed with: CRNA  Anesthesia Plan Comments:       Anesthesia Quick Evaluation

## 2018-03-03 NOTE — H&P (Addendum)
Christy Serrano  Location: [facility] Surgery Patient #: [id number] DOB: 09-07-1955 Single / Language: Cleophus Molt / Race: Black or African American Female   History of Present Illness  The patient is a 62 year old female who presents with breast cancer. We are asked to see the patient in consultation by Dr. Ardeth Perfect to evaluate her for a new left breast cancer. The patient is a 62 year old black female who recently went for a routine screening mammogram. At that time she was found to have a 1.7 cm mass in the upper outer left breast with 5.4 cm of calcification around it as well as an abnormal lymph node. The lymph node was positive. 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 The tumor was ER and PR negative and HER-2 positive with a Ki-67 of 30%. She does have a history of breast cancer in 2 sisters and a mother.   Past Surgical History  Breast Biopsy  Left. Shoulder Surgery  Left.  Diagnostic Studies History  Colonoscopy  5-10 years ago  Allergies  Amoxicillin *PENICILLINS*  Percocet *ANALGESICS - OPIOID*  Allergies Reconciled   Medication History  Estradiol (0.'075MG'$ /24HR Patch TW, Transdermal) Active. Ventolin HFA (108 (90 Base)MCG/ACT Aerosol Soln, Inhalation) Active. Cyclobenzaprine HCl ('10MG'$  Tablet, Oral) Active. Aspirin ('81MG'$  Tablet DR, Oral) Active. Calcium 1200 (1200-'1000MG'$ -UNIT Tablet Chewable, Oral) Active. Vitamin D3 (Oral) Specific strength unknown - Active. Multi Vitamin (Oral) Active. Medications Reconciled  Social History  Alcohol use  Remotely quit alcohol use. Caffeine use  Carbonated beverages, Coffee, Tea. No drug use  Tobacco use  Never smoker.  Family History  Breast Cancer  Family Members In General, Mother, Sister. Cerebrovascular Accident  Sister. Hypertension  Father, Mother, Sister. Migraine Headache  Sister. Prostate Cancer  Father.  Other Problems Breast  Cancer     Review of Systems  General Present- Weight Loss. Not Present- Appetite Loss, Chills, Fatigue, Fever, Night Sweats and Weight Gain. Skin Not Present- Change in Wart/Mole, Dryness, Hives, Jaundice, New Lesions, Non-Healing Wounds, Rash and Ulcer. HEENT Present- Wears glasses/contact lenses. Not Present- Earache, Hearing Loss, Hoarseness, Nose Bleed, Oral Ulcers, Ringing in the Ears, Seasonal Allergies, Sinus Pain, Sore Throat, Visual Disturbances and Yellow Eyes. Respiratory Not Present- Bloody sputum, Chronic Cough, Difficulty Breathing, Snoring and Wheezing. Breast Not Present- Breast Mass, Breast Pain, Nipple Discharge and Skin Changes. Cardiovascular Not Present- Chest Pain, Difficulty Breathing Lying Down, Leg Cramps, Palpitations, Rapid Heart Rate, Shortness of Breath and Swelling of Extremities. Gastrointestinal Not Present- Abdominal Pain, Bloating, Bloody Stool, Change in Bowel Habits, Chronic diarrhea, Constipation, Difficulty Swallowing, Excessive gas, Gets full quickly at meals, Hemorrhoids, Indigestion, Nausea, Rectal Pain and Vomiting. Musculoskeletal Not Present- Back Pain, Joint Pain, Joint Stiffness, Muscle Pain, Muscle Weakness and Swelling of Extremities. Neurological Not Present- Decreased Memory, Fainting, Headaches, Numbness, Seizures, Tingling, Tremor, Trouble walking and Weakness. Psychiatric Not Present- Anxiety, Bipolar, Change in Sleep Pattern, Depression, Fearful and Frequent crying. Endocrine Not Present- Cold Intolerance, Excessive Hunger, Hair Changes, Heat Intolerance, Hot flashes and New Diabetes. Hematology Present- Blood Thinners. Not Present- Easy Bruising, Excessive bleeding, Gland problems, HIV and Persistent Infections.  Vitals  Weight: 191.8 lb Height: 65.5in Body Surface Area: 1.95 m Body Mass Index: 31.43 kg/m  Temp.: 97.8F(Temporal)  Pulse: 83 (Regular)  P.OX: 98% (Room air) BP: 144/82 (Sitting, Left Arm,  Standard)       Physical Exam  General Mental Status-Alert. General Appearance-Consistent with stated age. Hydration-Well hydrated. Voice-Normal.  Head and  Neck Head-normocephalic, atraumatic with no lesions or palpable masses. Trachea-midline. Thyroid Gland Characteristics - normal size and consistency.  Eye Eyeball - Bilateral-Extraocular movements intact. Sclera/Conjunctiva - Bilateral-No scleral icterus.  Chest and Lung Exam Chest and lung exam reveals -quiet, even and easy respiratory effort with no use of accessory muscles and on auscultation, normal breath sounds, no adventitious sounds and normal vocal resonance. Inspection Chest Wall - Normal. Back - normal.  Breast Note: There is a palpable bruise in the upper outer left breast. There is no palpable mass in the right breast. There is a single palpable mobile lymph node in the left axilla.   Cardiovascular Cardiovascular examination reveals -normal heart sounds, regular rate and rhythm with no murmurs and normal pedal pulses bilaterally.  Abdomen Inspection Inspection of the abdomen reveals - No Hernias. Skin - Scar - no surgical scars. Palpation/Percussion Palpation and Percussion of the abdomen reveal - Soft, Non Tender, No Rebound tenderness, No Rigidity (guarding) and No hepatosplenomegaly. Auscultation Auscultation of the abdomen reveals - Bowel sounds normal.  Neurologic Neurologic evaluation reveals -alert and oriented x 3 with no impairment of recent or remote memory. Mental Status-Normal.  Musculoskeletal Normal Exam - Left-Upper Extremity Strength Normal and Lower Extremity Strength Normal. Normal Exam - Right-Upper Extremity Strength Normal and Lower Extremity Strength Normal.  Lymphatic Head & Neck  General Head & Neck Lymphatics: Bilateral - Description - Normal. Axillary  General Axillary Region: Bilateral - Description - Normal. Tenderness - Non  Tender. Femoral & Inguinal  Generalized Femoral & Inguinal Lymphatics: Bilateral - Description - Normal. Tenderness - Non Tender.    Assessment & Plan  MALIGNANT NEOPLASM OF UPPER-OUTER QUADRANT OF LEFT BREAST IN FEMALE, ESTROGEN RECEPTOR NEGATIVE (C50.412) Impression: The patient appears to have an invasive breast cancer in the upper outer left breast that covers approximate 5.4 cm. She also has a positive lymph node. Because of her positive lymph node and because her cancer is HER-2 positive I think she will need chemotherapy and she may benefit from neoadjuvant chemotherapy. If she does chemotherapy and her lymph node normalizes then she may be a candidate for targeted node dissection. If she declines chemotherapy then she would need a formal lymph node dissection. Given the size of the area I think that mastectomy may be her best option but there would be a possibility of doing breast conservation but it would likely change the size of the left breast. At this point I will go ahead and refer her to medical and radiation oncology. I will also refer her to plastic surgery to talk about reconstructive options. I have discussed with her placing a Port-A-Cath including the risks and benefits and some of the technical aspects and she understands. She will let me know what her decision is as far as how she wants to proceed. She will also likely need genetic testing as part of her evaluation given her strong family history. Current Plans Referred to Oncology, for evaluation and follow up (Oncology). Routine. Referred to Surgery - Plastic, for evaluation and follow up (Plastic Surgery). Routine.

## 2018-03-03 NOTE — Interval H&P Note (Signed)
History and Physical Interval Note:  03/03/2018 8:24 AM  Christy Serrano  has presented today for surgery, with the diagnosis of LEFT BREAST CANCER  The various methods of treatment have been discussed with the patient and family. After consideration of risks, benefits and other options for treatment, the patient has consented to  Procedure(s): INSERTION PORT-A-CATH (N/A) as a surgical intervention .  The patient's history has been reviewed, patient examined, no change in status, stable for surgery.  I have reviewed the patient's chart and labs.  Questions were answered to the patient's satisfaction.     [name]

## 2018-03-03 NOTE — Discharge Instructions (Signed)
°  NO Tylenol before 2;30 pm to day!    Post Anesthesia Home Care Instructions  Activity: Get plenty of rest for the remainder of the day. A responsible individual must stay with you for 24 hours following the procedure.  For the next 24 hours, DO NOT: -Drive a car -Paediatric nurse -Drink alcoholic beverages -Take any medication unless instructed by your physician -Make any legal decisions or sign important papers.  Meals: Start with liquid foods such as gelatin or soup. Progress to regular foods as tolerated. Avoid greasy, spicy, heavy foods. If nausea and/or vomiting occur, drink only clear liquids until the nausea and/or vomiting subsides. Call your physician if vomiting continues.  Special Instructions/Symptoms: Your throat may feel dry or sore from the anesthesia or the breathing tube placed in your throat during surgery. If this causes discomfort, gargle with warm salt water. The discomfort should disappear within 24 hours.  If you had a scopolamine patch placed behind your ear for the management of post- operative nausea and/or vomiting:  1. The medication in the patch is effective for 72 hours, after which it should be removed.  Wrap patch in a tissue and discard in the trash. Wash hands thoroughly with soap and water. 2. You may remove the patch earlier than 72 hours if you experience unpleasant side effects which may include dry mouth, dizziness or visual disturbances. 3. Avoid touching the patch. Wash your hands with soap and water after contact with the patch.

## 2018-03-03 NOTE — Anesthesia Procedure Notes (Signed)
Procedure Name: LMA Insertion Date/Time: 03/03/2018 9:07 AM Performed by: Signe Colt, CRNA Pre-anesthesia Checklist: Patient identified, Emergency Drugs available, Suction available and Patient being monitored Patient Re-evaluated:Patient Re-evaluated prior to induction Oxygen Delivery Method: Circle system utilized Preoxygenation: Pre-oxygenation with 100% oxygen Induction Type: IV induction Ventilation: Mask ventilation without difficulty LMA: LMA inserted LMA Size: 4.0 Number of attempts: 1 Airway Equipment and Method: Bite block Placement Confirmation: positive ETCO2 Tube secured with: Tape Dental Injury: Teeth and Oropharynx as per pre-operative assessment

## 2018-03-04 ENCOUNTER — Encounter: Payer: Self-pay | Admitting: *Deleted

## 2018-03-04 ENCOUNTER — Inpatient Hospital Stay: Payer: Managed Care, Other (non HMO)

## 2018-03-04 ENCOUNTER — Encounter: Payer: Self-pay | Admitting: Hematology and Oncology

## 2018-03-04 ENCOUNTER — Encounter (HOSPITAL_BASED_OUTPATIENT_CLINIC_OR_DEPARTMENT_OTHER): Payer: Self-pay | Admitting: General Surgery

## 2018-03-04 VITALS — BP 129/62 | HR 56 | Temp 98.7°F | Resp 16

## 2018-03-04 DIAGNOSIS — C50412 Malignant neoplasm of upper-outer quadrant of left female breast: Secondary | ICD-10-CM

## 2018-03-04 DIAGNOSIS — Z95828 Presence of other vascular implants and grafts: Secondary | ICD-10-CM | POA: Insufficient documentation

## 2018-03-04 DIAGNOSIS — Z171 Estrogen receptor negative status [ER-]: Principal | ICD-10-CM

## 2018-03-04 LAB — CBC WITH DIFFERENTIAL (CANCER CENTER ONLY)
Abs Immature Granulocytes: 0.03 10*3/uL (ref 0.00–0.07)
Basophils Absolute: 0 10*3/uL (ref 0.0–0.1)
Basophils Relative: 0 %
Eosinophils Absolute: 0 10*3/uL (ref 0.0–0.5)
Eosinophils Relative: 0 %
HCT: 33.8 % — ABNORMAL LOW (ref 36.0–46.0)
Hemoglobin: 10.9 g/dL — ABNORMAL LOW (ref 12.0–15.0)
IMMATURE GRANULOCYTES: 0 %
Lymphocytes Relative: 10 %
Lymphs Abs: 1.1 10*3/uL (ref 0.7–4.0)
MCH: 29.8 pg (ref 26.0–34.0)
MCHC: 32.2 g/dL (ref 30.0–36.0)
MCV: 92.3 fL (ref 80.0–100.0)
MONOS PCT: 2 %
Monocytes Absolute: 0.2 10*3/uL (ref 0.1–1.0)
NRBC: 0 % (ref 0.0–0.2)
Neutro Abs: 9.4 10*3/uL — ABNORMAL HIGH (ref 1.7–7.7)
Neutrophils Relative %: 88 %
Platelet Count: 172 10*3/uL (ref 150–400)
RBC: 3.66 MIL/uL — ABNORMAL LOW (ref 3.87–5.11)
RDW: 13.7 % (ref 11.5–15.5)
WBC Count: 10.7 10*3/uL — ABNORMAL HIGH (ref 4.0–10.5)

## 2018-03-04 LAB — CMP (CANCER CENTER ONLY)
ALT: 27 U/L (ref 0–44)
AST: 28 U/L (ref 15–41)
Albumin: 3.8 g/dL (ref 3.5–5.0)
Alkaline Phosphatase: 66 U/L (ref 38–126)
Anion gap: 9 (ref 5–15)
BUN: 19 mg/dL (ref 6–20)
CO2: 26 mmol/L (ref 22–32)
Calcium: 9.7 mg/dL (ref 8.9–10.3)
Chloride: 104 mmol/L (ref 98–111)
Creatinine: 1.06 mg/dL — ABNORMAL HIGH (ref 0.44–1.00)
GFR, Est AFR Am: >60 mL/min (ref >60)
GFR, Estimated: 57 mL/min — ABNORMAL LOW (ref >60)
Glucose, Bld: 133 mg/dL — ABNORMAL HIGH (ref 70–99)
Potassium: 3.9 mmol/L (ref 3.5–5.1)
Sodium: 139 mmol/L (ref 135–145)
Total Bilirubin: 0.5 mg/dL (ref 0.3–1.2)
Total Protein: 7.4 g/dL (ref 6.5–8.1)

## 2018-03-04 MED ORDER — ACETAMINOPHEN 325 MG PO TABS
ORAL_TABLET | ORAL | Status: AC
  Filled 2018-03-04: qty 2

## 2018-03-04 MED ORDER — ACETAMINOPHEN 325 MG PO TABS
650.0000 mg | ORAL_TABLET | Freq: Once | ORAL | Status: AC
  Administered 2018-03-04: 650 mg via ORAL

## 2018-03-04 MED ORDER — PALONOSETRON HCL INJECTION 0.25 MG/5ML
INTRAVENOUS | Status: AC
  Filled 2018-03-04: qty 5

## 2018-03-04 MED ORDER — DIPHENHYDRAMINE HCL 25 MG PO CAPS
50.0000 mg | ORAL_CAPSULE | Freq: Once | ORAL | Status: AC
  Administered 2018-03-04: 50 mg via ORAL

## 2018-03-04 MED ORDER — TRASTUZUMAB CHEMO 150 MG IV SOLR
8.0000 mg/kg | Freq: Once | INTRAVENOUS | Status: AC
  Administered 2018-03-04: 693 mg via INTRAVENOUS
  Filled 2018-03-04: qty 33

## 2018-03-04 MED ORDER — DIPHENHYDRAMINE HCL 25 MG PO CAPS
ORAL_CAPSULE | ORAL | Status: AC
  Filled 2018-03-04: qty 2

## 2018-03-04 MED ORDER — SODIUM CHLORIDE 0.9 % IV SOLN
600.0000 mg | Freq: Once | INTRAVENOUS | Status: AC
  Administered 2018-03-04: 600 mg via INTRAVENOUS
  Filled 2018-03-04: qty 60

## 2018-03-04 MED ORDER — HEPARIN SOD (PORK) LOCK FLUSH 100 UNIT/ML IV SOLN
500.0000 [IU] | Freq: Once | INTRAVENOUS | Status: AC | PRN
  Administered 2018-03-04: 500 [IU]
  Filled 2018-03-04: qty 5

## 2018-03-04 MED ORDER — SODIUM CHLORIDE 0.9% FLUSH
10.0000 mL | INTRAVENOUS | Status: DC | PRN
  Administered 2018-03-04: 10 mL
  Filled 2018-03-04: qty 10

## 2018-03-04 MED ORDER — SODIUM CHLORIDE 0.9 % IV SOLN
75.0000 mg/m2 | Freq: Once | INTRAVENOUS | Status: AC
  Administered 2018-03-04: 150 mg via INTRAVENOUS
  Filled 2018-03-04: qty 15

## 2018-03-04 MED ORDER — SODIUM CHLORIDE 0.9 % IV SOLN
840.0000 mg | Freq: Once | INTRAVENOUS | Status: AC
  Administered 2018-03-04: 840 mg via INTRAVENOUS
  Filled 2018-03-04: qty 28

## 2018-03-04 MED ORDER — SODIUM CHLORIDE 0.9 % IV SOLN
Freq: Once | INTRAVENOUS | Status: AC
  Administered 2018-03-04: 09:00:00 via INTRAVENOUS
  Filled 2018-03-04: qty 250

## 2018-03-04 MED ORDER — PALONOSETRON HCL INJECTION 0.25 MG/5ML
0.2500 mg | Freq: Once | INTRAVENOUS | Status: AC
  Administered 2018-03-04: 0.25 mg via INTRAVENOUS

## 2018-03-04 MED ORDER — SODIUM CHLORIDE 0.9 % IV SOLN
Freq: Once | INTRAVENOUS | Status: AC
  Administered 2018-03-04: 14:00:00 via INTRAVENOUS
  Filled 2018-03-04: qty 5

## 2018-03-04 NOTE — Progress Notes (Signed)
Patient c/o bilateral hand swelling during Taxotere infusion.  Patient denies any chest pain, and shortness of breath.  VS stable.  Sandi Mealy, PA-C will access patient d/t first time infusion in infusion area.

## 2018-03-04 NOTE — Patient Instructions (Signed)
[facility] Discharge Instructions for Patients Receiving Chemotherapy  Today you received the following chemotherapy agents: Herceptin, Perjeta, Taxotere, and Carboplatin.   To help prevent nausea and vomiting after your treatment, we encourage you to take your nausea medication as directed.   If you develop nausea and vomiting that is not controlled by your nausea medication, call the clinic.   BELOW ARE SYMPTOMS THAT SHOULD BE REPORTED IMMEDIATELY:  *FEVER GREATER THAN 100.5 F  *CHILLS WITH OR WITHOUT FEVER  NAUSEA AND VOMITING THAT IS NOT CONTROLLED WITH YOUR NAUSEA MEDICATION  *UNUSUAL SHORTNESS OF BREATH  *UNUSUAL BRUISING OR BLEEDING  TENDERNESS IN MOUTH AND THROAT WITH OR WITHOUT PRESENCE OF ULCERS  *URINARY PROBLEMS  *BOWEL PROBLEMS  UNUSUAL RASH Items with * indicate a potential emergency and should be followed up as soon as possible.  Feel free to call the clinic should you have any questions or concerns. The clinic phone number is [phone].  Please show the [facility] at check-in to the Emergency Department and triage nurse.  Trastuzumab injection for infusion What is this medicine? TRASTUZUMAB (tras TOO zoo mab) is a monoclonal antibody. It is used to treat breast cancer and stomach cancer. This medicine may be used for other purposes; ask your health care provider or pharmacist if you have questions. COMMON BRAND NAME(S): Herceptin What should I tell my health care provider before I take this medicine? They need to know if you have any of these conditions: -heart disease -heart failure -lung or breathing disease, like asthma -an unusual or allergic reaction to trastuzumab, benzyl alcohol, or other medications, foods, dyes, or preservatives -pregnant or trying to get pregnant -breast-feeding How should I use this medicine? This drug is given as an infusion into a vein. It is administered in a hospital or clinic by a specially  trained health care professional. Talk to your pediatrician regarding the use of this medicine in children. This medicine is not approved for use in children. Overdosage: If you think you have taken too much of this medicine contact a poison control center or emergency room at once. NOTE: This medicine is only for you. Do not share this medicine with others. What if I miss a dose? It is important not to miss a dose. Call your doctor or health care professional if you are unable to keep an appointment. What may interact with this medicine? This medicine may interact with the following medications: -certain types of chemotherapy, such as daunorubicin, doxorubicin, epirubicin, and idarubicin This list may not describe all possible interactions. Give your health care provider a list of all the medicines, herbs, non-prescription drugs, or dietary supplements you use. Also tell them if you smoke, drink alcohol, or use illegal drugs. Some items may interact with your medicine. What should I watch for while using this medicine? Visit your doctor for checks on your progress. Report any side effects. Continue your course of treatment even though you feel ill unless your doctor tells you to stop. Call your doctor or health care professional for advice if you get a fever, chills or sore throat, or other symptoms of a cold or flu. Do not treat yourself. Try to avoid being around people who are sick. You may experience fever, chills and shaking during your first infusion. These effects are usually mild and can be treated with other medicines. Report any side effects during the infusion to your health care professional. Fever and chills usually do not happen with later infusions.  Do not become pregnant while taking this medicine or for 7 months after stopping it. Women should inform their doctor if they wish to become pregnant or think they might be pregnant. Women of child-bearing potential will need to have a  negative pregnancy test before starting this medicine. There is a potential for serious side effects to an unborn child. Talk to your health care professional or pharmacist for more information. Do not breast-feed an infant while taking this medicine or for 7 months after stopping it. Women must use effective birth control with this medicine. What side effects may I notice from receiving this medicine? Side effects that you should report to your doctor or health care professional as soon as possible: -allergic reactions like skin rash, itching or hives, swelling of the face, lips, or tongue -chest pain or palpitations -cough -dizziness -feeling faint or lightheaded, falls -fever -general ill feeling or flu-like symptoms -signs of worsening heart failure like breathing problems; swelling in your legs and feet -unusually weak or tired Side effects that usually do not require medical attention (report to your doctor or health care professional if they continue or are bothersome): -bone pain -changes in taste -diarrhea -joint pain -nausea/vomiting -weight loss This list may not describe all possible side effects. Call your doctor for medical advice about side effects. You may report side effects to FDA at 1-800-FDA-1088. Where should I keep my medicine? This drug is given in a hospital or clinic and will not be stored at home. NOTE: This sheet is a summary. It may not cover all possible information. If you have questions about this medicine, talk to your doctor, pharmacist, or health care provider.  2018 Elsevier/Gold Standard (2016-03-04 14:37:52)  Pertuzumab injection What is this medicine? PERTUZUMAB (per TOOZ ue mab) is a monoclonal antibody. It is used to treat breast cancer. This medicine may be used for other purposes; ask your health care provider or pharmacist if you have questions. COMMON BRAND NAME(S): PERJETA What should I tell my health care provider before I take this  medicine? They need to know if you have any of these conditions: -heart disease -heart failure -high blood pressure -history of irregular heart beat -recent or ongoing radiation therapy -an unusual or allergic reaction to pertuzumab, other medicines, foods, dyes, or preservatives -pregnant or trying to get pregnant -breast-feeding How should I use this medicine? This medicine is for infusion into a vein. It is given by a health care professional in a hospital or clinic setting. Talk to your pediatrician regarding the use of this medicine in children. Special care may be needed. Overdosage: If you think you have taken too much of this medicine contact a poison control center or emergency room at once. NOTE: This medicine is only for you. Do not share this medicine with others. What if I miss a dose? It is important not to miss your dose. Call your doctor or health care professional if you are unable to keep an appointment. What may interact with this medicine? Interactions are not expected. Give your health care provider a list of all the medicines, herbs, non-prescription drugs, or dietary supplements you use. Also tell them if you smoke, drink alcohol, or use illegal drugs. Some items may interact with your medicine. This list may not describe all possible interactions. Give your health care provider a list of all the medicines, herbs, non-prescription drugs, or dietary supplements you use. Also tell them if you smoke, drink alcohol, or use illegal drugs. Some items  may interact with your medicine. What should I watch for while using this medicine? Your condition will be monitored carefully while you are receiving this medicine. Report any side effects. Continue your course of treatment even though you feel ill unless your doctor tells you to stop. Do not become pregnant while taking this medicine or for 7 months after stopping it. Women should inform their doctor if they wish to become  pregnant or think they might be pregnant. Women of child-bearing potential will need to have a negative pregnancy test before starting this medicine. There is a potential for serious side effects to an unborn child. Talk to your health care professional or pharmacist for more information. Do not breast-feed an infant while taking this medicine or for 7 months after stopping it. Women must use effective birth control with this medicine. Call your doctor or health care professional for advice if you get a fever, chills or sore throat, or other symptoms of a cold or flu. Do not treat yourself. Try to avoid being around people who are sick. You may experience fever, chills, and headache during the infusion. Report any side effects during the infusion to your health care professional. What side effects may I notice from receiving this medicine? Side effects that you should report to your doctor or health care professional as soon as possible: -breathing problems -chest pain or palpitations -dizziness -feeling faint or lightheaded -fever or chills -skin rash, itching or hives -sore throat -swelling of the face, lips, or tongue -swelling of the legs or ankles -unusually weak or tired Side effects that usually do not require medical attention (report to your doctor or health care professional if they continue or are bothersome): -diarrhea -hair loss -nausea, vomiting -tiredness This list may not describe all possible side effects. Call your doctor for medical advice about side effects. You may report side effects to FDA at 1-800-FDA-1088. Where should I keep my medicine? This drug is given in a hospital or clinic and will not be stored at home. NOTE: This sheet is a summary. It may not cover all possible information. If you have questions about this medicine, talk to your doctor, pharmacist, or health care provider.  2018 Elsevier/Gold Standard (2015-04-12 12:08:50)  Docetaxel injection What is  this medicine? DOCETAXEL (doe se TAX el) is a chemotherapy drug. It targets fast dividing cells, like cancer cells, and causes these cells to die. This medicine is used to treat many types of cancers like breast cancer, certain stomach cancers, head and neck cancer, lung cancer, and prostate cancer. This medicine may be used for other purposes; ask your health care provider or pharmacist if you have questions. COMMON BRAND NAME(S): Docefrez, Taxotere What should I tell my health care provider before I take this medicine? They need to know if you have any of these conditions: -infection (especially a virus infection such as chickenpox, cold sores, or herpes) -liver disease -low blood counts, like low white cell, platelet, or red cell counts -an unusual or allergic reaction to docetaxel, polysorbate 80, other chemotherapy agents, other medicines, foods, dyes, or preservatives -pregnant or trying to get pregnant -breast-feeding How should I use this medicine? This drug is given as an infusion into a vein. It is administered in a hospital or clinic by a specially trained health care professional. Talk to your pediatrician regarding the use of this medicine in children. Special care may be needed. Overdosage: If you think you have taken too much of this medicine contact a  poison control center or emergency room at once. NOTE: This medicine is only for you. Do not share this medicine with others. What if I miss a dose? It is important not to miss your dose. Call your doctor or health care professional if you are unable to keep an appointment. What may interact with this medicine? -cyclosporine -erythromycin -ketoconazole -medicines to increase blood counts like filgrastim, pegfilgrastim, sargramostim -vaccines Talk to your doctor or health care professional before taking any of these medicines: -acetaminophen -aspirin -ibuprofen -ketoprofen -naproxen This list may not describe all possible  interactions. Give your health care provider a list of all the medicines, herbs, non-prescription drugs, or dietary supplements you use. Also tell them if you smoke, drink alcohol, or use illegal drugs. Some items may interact with your medicine. What should I watch for while using this medicine? Your condition will be monitored carefully while you are receiving this medicine. You will need important blood work done while you are taking this medicine. This drug may make you feel generally unwell. This is not uncommon, as chemotherapy can affect healthy cells as well as cancer cells. Report any side effects. Continue your course of treatment even though you feel ill unless your doctor tells you to stop. In some cases, you may be given additional medicines to help with side effects. Follow all directions for their use. Call your doctor or health care professional for advice if you get a fever, chills or sore throat, or other symptoms of a cold or flu. Do not treat yourself. This drug decreases your body's ability to fight infections. Try to avoid being around people who are sick. This medicine may increase your risk to bruise or bleed. Call your doctor or health care professional if you notice any unusual bleeding. This medicine may contain alcohol in the product. You may get drowsy or dizzy. Do not drive, use machinery, or do anything that needs mental alertness until you know how this medicine affects you. Do not stand or sit up quickly, especially if you are an older patient. This reduces the risk of dizzy or fainting spells. Avoid alcoholic drinks. Do not become pregnant while taking this medicine. Women should inform their doctor if they wish to become pregnant or think they might be pregnant. There is a potential for serious side effects to an unborn child. Talk to your health care professional or pharmacist for more information. Do not breast-feed an infant while taking this medicine. What side effects  may I notice from receiving this medicine? Side effects that you should report to your doctor or health care professional as soon as possible: -allergic reactions like skin rash, itching or hives, swelling of the face, lips, or tongue -low blood counts - This drug may decrease the number of white blood cells, red blood cells and platelets. You may be at increased risk for infections and bleeding. -signs of infection - fever or chills, cough, sore throat, pain or difficulty passing urine -signs of decreased platelets or bleeding - bruising, pinpoint red spots on the skin, black, tarry stools, nosebleeds -signs of decreased red blood cells - unusually weak or tired, fainting spells, lightheadedness -breathing problems -fast or irregular heartbeat -low blood pressure -mouth sores -nausea and vomiting -pain, swelling, redness or irritation at the injection site -pain, tingling, numbness in the hands or feet -swelling of the ankle, feet, hands -weight gain Side effects that usually do not require medical attention (report to your doctor or health care professional if they continue  or are bothersome): -bone pain -complete hair loss including hair on your head, underarms, pubic hair, eyebrows, and eyelashes -diarrhea -excessive tearing -changes in the color of fingernails -loosening of the fingernails -nausea -muscle pain -red flush to skin -sweating -weak or tired This list may not describe all possible side effects. Call your doctor for medical advice about side effects. You may report side effects to FDA at 1-800-FDA-1088. Where should I keep my medicine? This drug is given in a hospital or clinic and will not be stored at home. NOTE: This sheet is a summary. It may not cover all possible information. If you have questions about this medicine, talk to your doctor, pharmacist, or health care provider.  2018 Elsevier/Gold Standard (2015-04-12 12:32:56)  Carboplatin injection What is  this medicine? CARBOPLATIN (KAR boe pla tin) is a chemotherapy drug. It targets fast dividing cells, like cancer cells, and causes these cells to die. This medicine is used to treat ovarian cancer and many other cancers. This medicine may be used for other purposes; ask your health care provider or pharmacist if you have questions. COMMON BRAND NAME(S): Paraplatin What should I tell my health care provider before I take this medicine? They need to know if you have any of these conditions: -blood disorders -hearing problems -kidney disease -recent or ongoing radiation therapy -an unusual or allergic reaction to carboplatin, cisplatin, other chemotherapy, other medicines, foods, dyes, or preservatives -pregnant or trying to get pregnant -breast-feeding How should I use this medicine? This drug is usually given as an infusion into a vein. It is administered in a hospital or clinic by a specially trained health care professional. Talk to your pediatrician regarding the use of this medicine in children. Special care may be needed. Overdosage: If you think you have taken too much of this medicine contact a poison control center or emergency room at once. NOTE: This medicine is only for you. Do not share this medicine with others. What if I miss a dose? It is important not to miss a dose. Call your doctor or health care professional if you are unable to keep an appointment. What may interact with this medicine? -medicines for seizures -medicines to increase blood counts like filgrastim, pegfilgrastim, sargramostim -some antibiotics like amikacin, gentamicin, neomycin, streptomycin, tobramycin -vaccines Talk to your doctor or health care professional before taking any of these medicines: -acetaminophen -aspirin -ibuprofen -ketoprofen -naproxen This list may not describe all possible interactions. Give your health care provider a list of all the medicines, herbs, non-prescription drugs, or  dietary supplements you use. Also tell them if you smoke, drink alcohol, or use illegal drugs. Some items may interact with your medicine. What should I watch for while using this medicine? Your condition will be monitored carefully while you are receiving this medicine. You will need important blood work done while you are taking this medicine. This drug may make you feel generally unwell. This is not uncommon, as chemotherapy can affect healthy cells as well as cancer cells. Report any side effects. Continue your course of treatment even though you feel ill unless your doctor tells you to stop. In some cases, you may be given additional medicines to help with side effects. Follow all directions for their use. Call your doctor or health care professional for advice if you get a fever, chills or sore throat, or other symptoms of a cold or flu. Do not treat yourself. This drug decreases your body's ability to fight infections. Try to avoid being around  people who are sick. This medicine may increase your risk to bruise or bleed. Call your doctor or health care professional if you notice any unusual bleeding. Be careful brushing and flossing your teeth or using a toothpick because you may get an infection or bleed more easily. If you have any dental work done, tell your dentist you are receiving this medicine. Avoid taking products that contain aspirin, acetaminophen, ibuprofen, naproxen, or ketoprofen unless instructed by your doctor. These medicines may hide a fever. Do not become pregnant while taking this medicine. Women should inform their doctor if they wish to become pregnant or think they might be pregnant. There is a potential for serious side effects to an unborn child. Talk to your health care professional or pharmacist for more information. Do not breast-feed an infant while taking this medicine. What side effects may I notice from receiving this medicine? Side effects that you should report to  your doctor or health care professional as soon as possible: -allergic reactions like skin rash, itching or hives, swelling of the face, lips, or tongue -signs of infection - fever or chills, cough, sore throat, pain or difficulty passing urine -signs of decreased platelets or bleeding - bruising, pinpoint red spots on the skin, black, tarry stools, nosebleeds -signs of decreased red blood cells - unusually weak or tired, fainting spells, lightheadedness -breathing problems -changes in hearing -changes in vision -chest pain -high blood pressure -low blood counts - This drug may decrease the number of white blood cells, red blood cells and platelets. You may be at increased risk for infections and bleeding. -nausea and vomiting -pain, swelling, redness or irritation at the injection site -pain, tingling, numbness in the hands or feet -problems with balance, talking, walking -trouble passing urine or change in the amount of urine Side effects that usually do not require medical attention (report to your doctor or health care professional if they continue or are bothersome): -hair loss -loss of appetite -metallic taste in the mouth or changes in taste This list may not describe all possible side effects. Call your doctor for medical advice about side effects. You may report side effects to FDA at 1-800-FDA-1088. Where should I keep my medicine? This drug is given in a hospital or clinic and will not be stored at home. NOTE: This sheet is a summary. It may not cover all possible information. If you have questions about this medicine, talk to your doctor, pharmacist, or health care provider.  2018 Elsevier/Gold Standard (2007-06-15 14:38:05)

## 2018-03-04 NOTE — Progress Notes (Signed)
Went to infusion to introduce myself as Financial Resource Specialist and to offer available resources. ° °Asked patient if she knows whether or not she has met her DED/OOP for this year and she states she isn't sure, she hasn't opened her EOBs. Advised her if she has not, there is copay assistance available for Herceptin, Perjeta, and Udenyca if needed to help cover her OOP cost after her insurance pays. Advised her if she would like to apply in the new year when her insurance renews to let me know. She verbalized understanding. ° °Discussed one-time $1000 [employer] grant and qualifications. Advised her what would be needed to apply if interested and the types of expenses that are covered. She verbalized understanding. ° °She has my card for any additional financial questions or concerns.    °

## 2018-03-06 ENCOUNTER — Inpatient Hospital Stay: Payer: Managed Care, Other (non HMO)

## 2018-03-06 VITALS — BP 147/76 | HR 61 | Temp 97.7°F | Resp 16

## 2018-03-06 DIAGNOSIS — C50412 Malignant neoplasm of upper-outer quadrant of left female breast: Secondary | ICD-10-CM

## 2018-03-06 DIAGNOSIS — Z171 Estrogen receptor negative status [ER-]: Principal | ICD-10-CM

## 2018-03-06 MED ORDER — PEGFILGRASTIM-CBQV 6 MG/0.6ML ~~LOC~~ SOSY
6.0000 mg | PREFILLED_SYRINGE | Freq: Once | SUBCUTANEOUS | Status: AC
  Administered 2018-03-06: 6 mg via SUBCUTANEOUS

## 2018-03-06 NOTE — Patient Instructions (Signed)
Pegfilgrastim injection What is this medicine? PEGFILGRASTIM (PEG fil gra stim) is a long-acting granulocyte colony-stimulating factor that stimulates the growth of neutrophils, a type of white blood cell important in the body's fight against infection. It is used to reduce the incidence of fever and infection in patients with certain types of cancer who are receiving chemotherapy that affects the bone marrow, and to increase survival after being exposed to high doses of radiation. This medicine may be used for other purposes; ask your health care provider or pharmacist if you have questions. COMMON BRAND NAME(S): Neulasta,Udenyca  What should I tell my health care provider before I take this medicine? They need to know if you have any of these conditions: -kidney disease -latex allergy -ongoing radiation therapy -sickle cell disease -skin reactions to acrylic adhesives (On-Body Injector only) -an unusual or allergic reaction to pegfilgrastim, filgrastim, other medicines, foods, dyes, or preservatives -pregnant or trying to get pregnant -breast-feeding How should I use this medicine? This medicine is for injection under the skin. If you get this medicine at home, you will be taught how to prepare and give the pre-filled syringe or how to use the On-body Injector. Refer to the patient Instructions for Use for detailed instructions. Use exactly as directed. Tell your healthcare provider immediately if you suspect that the On-body Injector may not have performed as intended or if you suspect the use of the On-body Injector resulted in a missed or partial dose. It is important that you put your used needles and syringes in a special sharps container. Do not put them in a trash can. If you do not have a sharps container, call your pharmacist or healthcare provider to get one. Talk to your pediatrician regarding the use of this medicine in children. While this drug may be prescribed for selected  conditions, precautions do apply. Overdosage: If you think you have taken too much of this medicine contact a poison control center or emergency room at once. NOTE: This medicine is only for you. Do not share this medicine with others. What if I miss a dose? It is important not to miss your dose. Call your doctor or health care professional if you miss your dose. If you miss a dose due to an On-body Injector failure or leakage, a new dose should be administered as soon as possible using a single prefilled syringe for manual use. What may interact with this medicine? Interactions have not been studied. Give your health care provider a list of all the medicines, herbs, non-prescription drugs, or dietary supplements you use. Also tell them if you smoke, drink alcohol, or use illegal drugs. Some items may interact with your medicine. This list may not describe all possible interactions. Give your health care provider a list of all the medicines, herbs, non-prescription drugs, or dietary supplements you use. Also tell them if you smoke, drink alcohol, or use illegal drugs. Some items may interact with your medicine. What should I watch for while using this medicine? You may need blood work done while you are taking this medicine. If you are going to need a MRI, CT scan, or other procedure, tell your doctor that you are using this medicine (On-Body Injector only). What side effects may I notice from receiving this medicine? Side effects that you should report to your doctor or health care professional as soon as possible: -allergic reactions like skin rash, itching or hives, swelling of the face, lips, or tongue -dizziness -fever -pain, redness, or irritation at   site where injected -pinpoint red spots on the skin -red or dark-brown urine -shortness of breath or breathing problems -stomach or side pain, or pain at the shoulder -swelling -tiredness -trouble passing urine or change in the amount of  urine Side effects that usually do not require medical attention (report to your doctor or health care professional if they continue or are bothersome): -bone pain -muscle pain This list may not describe all possible side effects. Call your doctor for medical advice about side effects. You may report side effects to FDA at 1-800-FDA-1088. Where should I keep my medicine? Keep out of the reach of children. Store pre-filled syringes in a refrigerator between 2 and 8 degrees C (36 and 46 degrees F). Do not freeze. Keep in carton to protect from light. Throw away this medicine if it is left out of the refrigerator for more than 48 hours. Throw away any unused medicine after the expiration date. NOTE: This sheet is a summary. It may not cover all possible information. If you have questions about this medicine, talk to your doctor, pharmacist, or health care provider.  2018 [employer]vier/Gold Standard (2016-03-06 12:58:03)  

## 2018-03-07 ENCOUNTER — Other Ambulatory Visit: Payer: Self-pay

## 2018-03-07 ENCOUNTER — Emergency Department (HOSPITAL_COMMUNITY)
Admission: EM | Admit: 2018-03-07 | Discharge: 2018-03-07 | Disposition: A | Discharge status: Home / Self Care | Payer: Managed Care, Other (non HMO) | Attending: Emergency Medicine | Admitting: Emergency Medicine

## 2018-03-07 DIAGNOSIS — Z79899 Other long term (current) drug therapy: Secondary | ICD-10-CM | POA: Diagnosis not present

## 2018-03-07 DIAGNOSIS — R55 Syncope and collapse: Secondary | ICD-10-CM | POA: Diagnosis present

## 2018-03-07 DIAGNOSIS — Z7982 Long term (current) use of aspirin: Secondary | ICD-10-CM | POA: Insufficient documentation

## 2018-03-07 DIAGNOSIS — E876 Hypokalemia: Secondary | ICD-10-CM

## 2018-03-07 LAB — COMPREHENSIVE METABOLIC PANEL
ALT: 103 U/L — AB (ref 0–44)
AST: 55 U/L — ABNORMAL HIGH (ref 15–41)
Albumin: 3.7 g/dL (ref 3.5–5.0)
Alkaline Phosphatase: 53 U/L (ref 38–126)
Anion gap: 7 (ref 5–15)
BUN: 23 mg/dL — ABNORMAL HIGH (ref 6–20)
CHLORIDE: 102 mmol/L (ref 98–111)
CO2: 27 mmol/L (ref 22–32)
CREATININE: 1.07 mg/dL — AB (ref 0.44–1.00)
Calcium: 8.6 mg/dL — ABNORMAL LOW (ref 8.9–10.3)
GFR calc Af Amer: >60 mL/min (ref >60)
GFR calc non Af Amer: 56 mL/min — ABNORMAL LOW (ref >60)
Glucose, Bld: 122 mg/dL — ABNORMAL HIGH (ref 70–99)
Potassium: 3.1 mmol/L — ABNORMAL LOW (ref 3.5–5.1)
Sodium: 136 mmol/L (ref 135–145)
Total Bilirubin: 1.1 mg/dL (ref 0.3–1.2)
Total Protein: 6.6 g/dL (ref 6.5–8.1)

## 2018-03-07 LAB — I-STAT TROPONIN, ED: Troponin i, poc: 0.01 ng/mL (ref 0.00–0.08)

## 2018-03-07 LAB — CBC WITH DIFFERENTIAL/PLATELET
Abs Immature Granulocytes: 0.46 10*3/uL — ABNORMAL HIGH (ref 0.00–0.07)
Basophils Absolute: 0 10*3/uL (ref 0.0–0.1)
Basophils Relative: 0 %
Eosinophils Absolute: 0 10*3/uL (ref 0.0–0.5)
Eosinophils Relative: 0 %
HCT: 35.6 % — ABNORMAL LOW (ref 36.0–46.0)
Hemoglobin: 11.1 g/dL — ABNORMAL LOW (ref 12.0–15.0)
Immature Granulocytes: 4 %
Lymphocytes Relative: 13 %
Lymphs Abs: 1.6 10*3/uL (ref 0.7–4.0)
MCH: 29.3 pg (ref 26.0–34.0)
MCHC: 31.2 g/dL (ref 30.0–36.0)
MCV: 93.9 fL (ref 80.0–100.0)
Monocytes Absolute: 0 10*3/uL — ABNORMAL LOW (ref 0.1–1.0)
Monocytes Relative: 0 %
Neutro Abs: 10.8 10*3/uL — ABNORMAL HIGH (ref 1.7–7.7)
Neutrophils Relative %: 83 %
Platelets: 103 10*3/uL — ABNORMAL LOW (ref 150–400)
RBC: 3.79 MIL/uL — ABNORMAL LOW (ref 3.87–5.11)
RDW: 13.8 % (ref 11.5–15.5)
WBC: 13 10*3/uL — ABNORMAL HIGH (ref 4.0–10.5)
nRBC: 0 % (ref 0.0–0.2)

## 2018-03-07 MED ORDER — POTASSIUM CHLORIDE CRYS ER 20 MEQ PO TBCR: 20.0000 meq | EXTENDED_RELEASE_TABLET | Freq: Every day | ORAL | 0 refills | Status: DC

## 2018-03-07 MED ORDER — SODIUM CHLORIDE 0.9 % IV BOLUS
1000.0000 mL | Freq: Once | INTRAVENOUS | Status: AC
  Administered 2018-03-07: 1000 mL via INTRAVENOUS

## 2018-03-07 MED ORDER — POTASSIUM CHLORIDE CRYS ER 20 MEQ PO TBCR
40.0000 meq | EXTENDED_RELEASE_TABLET | Freq: Once | ORAL | Status: AC
  Administered 2018-03-07: 40 meq via ORAL
  Filled 2018-03-07: qty 2

## 2018-03-07 NOTE — ED Provider Notes (Signed)
[facility] DEPT Provider Note   CSN: [id number] Arrival date & time: 03/07/18  1108     History   Chief Complaint No chief complaint on file.   HPI Christy Serrano is a 62 y.o. female history of breast cancer here presenting with near syncope.  Patient states that she just started chemotherapy for her breast cancer 2 days ago.  Patient states that she was eating breakfast this morning and felt lightheaded and dizzy and felt like she was going to pass out.  She then had a brief episode of syncope.  She was with her sister at that time who witnessed the event.  States that she seemed to be confused for 30 minutes afterwards but denies any seizure-like activity or incontinence.  Patient has no history of seizures.  The history is provided by the patient.    Past Medical History:  Diagnosis Date  . Family history of breast cancer   . Family history of ovarian cancer   . Family history of prostate cancer   . Lactose intolerance   . PONV (postoperative nausea and vomiting)     Patient Active Problem List   Diagnosis Date Noted  . Port-A-Cath in place 03/04/2018  . Genetic testing 02/23/2018  . Family history of breast cancer   . Family history of prostate cancer   . Family history of ovarian cancer   . Malignant neoplasm of upper-outer quadrant of left breast in female, estrogen receptor negative ([place]) 02/03/2018  . Prediabetes 01/06/2018  . Reactive airway disease without complication 03/47/4259  . Class 1 obesity with serious comorbidity and body mass index (BMI) of 32.0 to 32.9 in adult 12/22/2017    Past Surgical History:  Procedure Laterality Date  . ABDOMINAL HYSTERECTOMY    . PORTACATH PLACEMENT Right 03/03/2018   Procedure: INSERTION PORT-A-CATH;  Surgeon: Jovita Kussmaul, MD;  Location: [facility];  Service: General;  Laterality: Right;  . ROTATOR CUFF REPAIR    . TOTAL ABDOMINAL HYSTERECTOMY       OB History    Gravida  1   Para  1   Term      Preterm      AB      Living        SAB      TAB      Ectopic      Multiple      Live Births               Home Medications    Prior to Admission medications   Medication Sig Start Date End Date Taking? Authorizing Provider  albuterol (PROVENTIL HFA;VENTOLIN HFA) 108 (90 Base) MCG/ACT inhaler Inhale into the lungs every 6 (six) hours as needed for wheezing or shortness of breath.    [provider]  aspirin 81 MG chewable tablet Chew by mouth daily.    [provider]  Calcium-Phosphorus-Vitamin D (CITRACAL +D3 PO) Take 1,200 mcg by mouth.    [provider]  cholecalciferol (VITAMIN D) 1000 units tablet Take 1,000 Units by mouth daily.    [provider]  dexamethasone (DECADRON) 4 MG tablet Take 1 tablet (4 mg total) by mouth daily. Take 1 tablet day before chemo and 1 tablet day after with food 02/08/18   Nicholas Lose, MD  estradiol (VIVELLE-DOT) 0.075 MG/24HR  01/05/18   [provider]  ibuprofen (ADVIL,MOTRIN) 200 MG tablet Take 200 mg by mouth every 6 (six) hours as needed.  [provider]  lidocaine-prilocaine (EMLA) cream Apply to affected area once 02/08/18   Nicholas Lose, MD  LORazepam (ATIVAN) 0.5 MG tablet Take 1 tablet (0.5 mg total) by mouth at bedtime as needed (Nausea or vomiting). 02/08/18   Nicholas Lose, MD  Multiple Vitamin (MULTIVITAMIN) capsule Take 1 capsule by mouth daily.    [provider]  ondansetron (ZOFRAN) 8 MG tablet Take 1 tablet (8 mg total) by mouth 2 (two) times daily as needed (Nausea or vomiting). Begin 4 days after chemotherapy. 02/08/18   Nicholas Lose, MD  prochlorperazine (COMPAZINE) 10 MG tablet Take 1 tablet (10 mg total) by mouth every 6 (six) hours as needed (Nausea or vomiting). 02/08/18   Nicholas Lose, MD  traMADol (ULTRAM) 50 MG tablet Take 1-2 tablets (50-100 mg total) by mouth every 6 (six) hours as needed. 03/03/18    Jovita Kussmaul, MD    Family History Family History  Problem Relation Age of Onset  . High blood pressure Mother   . High blood pressure Father   . Prostate cancer Father        dx mid 71s  . Breast cancer Sister 23       negative [employer] 46 gene panel  . Breast cancer Paternal Aunt        dx mid 45s  . Cancer Paternal Uncle        jaw cancer d. 9s  . Ovarian cancer Maternal Grandmother        d. 62s  . Cancer Paternal Grandmother        either stomach or ovarian cancer  . Breast cancer Sister 60       "negative 21 gene panel"    Social History Social History   Tobacco Use  . Smoking status: Never Smoker  . Smokeless tobacco: Never Used  Substance Use Topics  . Alcohol use: No  . Drug use: No     Allergies   Oxycodone-acetaminophen and Amoxicillin   Review of Systems Review of Systems  Neurological: Positive for syncope.  All other systems reviewed and are negative.    Physical Exam Updated Vital Signs BP 139/68 (BP Location: Left Arm)   Pulse 64   Temp 98.7 F (37.1 C) (Oral)   Ht 5\' 5"  (1.651 m)   Wt 86 kg   SpO2 100%   BMI 31.55 kg/m   Physical Exam Vitals signs and nursing note reviewed.  Constitutional:      Appearance: Normal appearance.  HENT:     Head: Normocephalic.     Nose: Nose normal.     Mouth/Throat:     Mouth: Mucous membranes are moist.  Eyes:     Extraocular Movements: Extraocular movements intact.     Pupils: Pupils are equal, round, and reactive to light.  Neck:     Musculoskeletal: Normal range of motion.  Cardiovascular:     Rate and Rhythm: Normal rate and regular rhythm.     Pulses: Normal pulses.  Pulmonary:     Effort: Pulmonary effort is normal.     Breath sounds: Normal breath sounds.  Abdominal:     General: Abdomen is flat.  Skin:    General: Skin is warm.     Capillary Refill: Capillary refill takes less than 2 seconds.  Neurological:     General: No focal deficit present.     Mental Status: She is  alert and oriented to person, place, and time.     Cranial Nerves: No cranial  nerve deficit.     Comments: CN 2- 12 intact, nl strength throughout, nl sensation, nl finger to nose bilaterally.   Psychiatric:        Mood and Affect: Mood normal.      ED Treatments / Results  Labs (all labs ordered are listed, but only abnormal results are displayed) Labs Reviewed  CBC WITH DIFFERENTIAL/PLATELET - Abnormal; Notable for the following components:      Result Value   WBC 13.0 (*)    RBC 3.79 (*)    Hemoglobin 11.1 (*)    HCT 35.6 (*)    Platelets 103 (*)    Neutro Abs 10.8 (*)    Monocytes Absolute 0.0 (*)    Abs Immature Granulocytes 0.46 (*)    All other components within normal limits  COMPREHENSIVE METABOLIC PANEL - Abnormal; Notable for the following components:   Potassium 3.1 (*)    Glucose, Bld 122 (*)    BUN 23 (*)    Creatinine, Ser 1.07 (*)    Calcium 8.6 (*)    AST 55 (*)    ALT 103 (*)    GFR calc non Af Amer 56 (*)    All other components within normal limits  I-STAT TROPONIN, ED    EKG EKG Interpretation  Date/Time:  Sunday March 07 2018 11:30:18 EST Ventricular Rate:  63 PR Interval:    QRS Duration: 124 QT Interval:  433 QTC Calculation: 444 R Axis:   61 Text Interpretation:  sinus rhythm  Nonspecific intraventricular conduction delay poor baseline. no previous to compared  Confirmed by Wandra Arthurs [phone]) on 03/07/2018 12:56:50 PM   Radiology No results found.  Procedures Procedures (including critical care time)  Medications Ordered in ED Medications  sodium chloride 0.9 % bolus 1,000 mL (0 mLs Intravenous Stopped 03/07/18 1312)  potassium chloride SA (K-DUR,KLOR-CON) CR tablet 40 mEq (40 mEq Oral Given 03/07/18 1337)     Initial Impression / Assessment and Plan / ED Course  I have reviewed the triage vital signs and the nursing notes.  Pertinent labs & imaging results that were available during my care of the patient were reviewed  by me and considered in my medical decision making (see chart for details).    Christy Serrano is a 62 y.o. female here with near syncope. Just started on chemo 2 days ago. Consider neutropenia vs electrolyte abnormality vs dehydration. Afebrile. No brain mets on recent PET scan and no seizure like activity. Will get labs. Will hydrate and reassess.   1:46 PM Patient felt well after IVF. K 3.1, supplemented. WBC 13 but has no fever at home. Neurologically intact. Likely near syncope from dehydration. She has poor appetite after chemo so will give several potassium pills to go home. Will have her recheck potassium in a week.    Final Clinical Impressions(s) / ED Diagnoses   Final diagnoses:  None    ED Discharge Orders    None       Drenda Freeze, MD 03/07/18 1347

## 2018-03-07 NOTE — Discharge Instructions (Signed)
Stay hydrated.   Take potassium 20 meq daily to keep your potassium up. Recheck potassium level in a week with your doctor  Eat and drink normally   Continue chemo as scheduled   Return to ER if you pass out, seizure like activity, lethargy, vomiting.

## 2018-03-07 NOTE — ED Notes (Signed)
Patient given discharge teaching and verbalized understanding. Patient ambulated out of ED with a steady gait. 

## 2018-03-07 NOTE — ED Notes (Signed)
Bed: JI31 Expected date:  Expected time:  Means of arrival:  Comments: EMS syncopal/cancer

## 2018-03-07 NOTE — ED Triage Notes (Signed)
Patient is from home, was eating breakfast with her sister, patient passed out. Has new diagnosis of breast cancer on left side. Started chemotherapy Friday. Patient denies pain. Vitals stable. A/O x3.

## 2018-03-08 ENCOUNTER — Observation Stay (HOSPITAL_COMMUNITY)
Admission: EM | Admit: 2018-03-08 | Discharge: 2018-03-10 | Disposition: A | Discharge status: Home / Self Care | Payer: Managed Care, Other (non HMO) | Attending: Internal Medicine | Admitting: Internal Medicine

## 2018-03-08 ENCOUNTER — Emergency Department (HOSPITAL_COMMUNITY): Payer: Managed Care, Other (non HMO)

## 2018-03-08 ENCOUNTER — Other Ambulatory Visit: Payer: Managed Care, Other (non HMO)

## 2018-03-08 ENCOUNTER — Ambulatory Visit: Payer: Managed Care, Other (non HMO) | Admitting: Hematology and Oncology

## 2018-03-08 ENCOUNTER — Encounter (HOSPITAL_COMMUNITY): Payer: Self-pay

## 2018-03-08 ENCOUNTER — Observation Stay (HOSPITAL_COMMUNITY): Payer: Managed Care, Other (non HMO)

## 2018-03-08 ENCOUNTER — Other Ambulatory Visit: Payer: Self-pay

## 2018-03-08 DIAGNOSIS — R945 Abnormal results of liver function studies: Secondary | ICD-10-CM

## 2018-03-08 DIAGNOSIS — K7689 Other specified diseases of liver: Secondary | ICD-10-CM | POA: Diagnosis not present

## 2018-03-08 DIAGNOSIS — Z7982 Long term (current) use of aspirin: Secondary | ICD-10-CM | POA: Diagnosis not present

## 2018-03-08 DIAGNOSIS — R55 Syncope and collapse: Principal | ICD-10-CM

## 2018-03-08 DIAGNOSIS — Z171 Estrogen receptor negative status [ER-]: Secondary | ICD-10-CM | POA: Diagnosis not present

## 2018-03-08 DIAGNOSIS — Z79899 Other long term (current) drug therapy: Secondary | ICD-10-CM | POA: Diagnosis not present

## 2018-03-08 DIAGNOSIS — R569 Unspecified convulsions: Secondary | ICD-10-CM | POA: Insufficient documentation

## 2018-03-08 DIAGNOSIS — Z8041 Family history of malignant neoplasm of ovary: Secondary | ICD-10-CM | POA: Diagnosis not present

## 2018-03-08 DIAGNOSIS — D696 Thrombocytopenia, unspecified: Secondary | ICD-10-CM | POA: Insufficient documentation

## 2018-03-08 DIAGNOSIS — Z880 Allergy status to penicillin: Secondary | ICD-10-CM | POA: Insufficient documentation

## 2018-03-08 DIAGNOSIS — D72819 Decreased white blood cell count, unspecified: Secondary | ICD-10-CM | POA: Diagnosis not present

## 2018-03-08 DIAGNOSIS — C50912 Malignant neoplasm of unspecified site of left female breast: Secondary | ICD-10-CM | POA: Diagnosis not present

## 2018-03-08 DIAGNOSIS — C50922 Malignant neoplasm of unspecified site of left male breast: Secondary | ICD-10-CM | POA: Insufficient documentation

## 2018-03-08 DIAGNOSIS — R7989 Other specified abnormal findings of blood chemistry: Secondary | ICD-10-CM | POA: Diagnosis not present

## 2018-03-08 DIAGNOSIS — Z803 Family history of malignant neoplasm of breast: Secondary | ICD-10-CM | POA: Insufficient documentation

## 2018-03-08 DIAGNOSIS — G9389 Other specified disorders of brain: Secondary | ICD-10-CM | POA: Diagnosis not present

## 2018-03-08 DIAGNOSIS — Z791 Long term (current) use of non-steroidal anti-inflammatories (NSAID): Secondary | ICD-10-CM | POA: Diagnosis not present

## 2018-03-08 DIAGNOSIS — Z8042 Family history of malignant neoplasm of prostate: Secondary | ICD-10-CM | POA: Diagnosis not present

## 2018-03-08 DIAGNOSIS — E871 Hypo-osmolality and hyponatremia: Secondary | ICD-10-CM | POA: Diagnosis not present

## 2018-03-08 DIAGNOSIS — Z885 Allergy status to narcotic agent status: Secondary | ICD-10-CM | POA: Diagnosis not present

## 2018-03-08 LAB — URINALYSIS, ROUTINE W REFLEX MICROSCOPIC
Bilirubin Urine: NEGATIVE
Glucose, UA: NEGATIVE mg/dL
Hgb urine dipstick: NEGATIVE
Ketones, ur: NEGATIVE mg/dL
LEUKOCYTES UA: NEGATIVE
Nitrite: NEGATIVE
PROTEIN: NEGATIVE mg/dL
Specific Gravity, Urine: 1.004 — ABNORMAL LOW (ref 1.005–1.030)
pH: 8 (ref 5.0–8.0)

## 2018-03-08 LAB — CBC WITH DIFFERENTIAL/PLATELET
ABS IMMATURE GRANULOCYTES: 0.37 10*3/uL — AB (ref 0.00–0.07)
Basophils Absolute: 0.1 10*3/uL (ref 0.0–0.1)
Basophils Relative: 1 %
EOS PCT: 1 %
Eosinophils Absolute: 0 10*3/uL (ref 0.0–0.5)
HCT: 36.1 % (ref 36.0–46.0)
HEMOGLOBIN: 11.3 g/dL — AB (ref 12.0–15.0)
Immature Granulocytes: 6 %
LYMPHS PCT: 13 %
Lymphs Abs: 0.7 10*3/uL (ref 0.7–4.0)
MCH: 30 pg (ref 26.0–34.0)
MCHC: 31.3 g/dL (ref 30.0–36.0)
MCV: 95.8 fL (ref 80.0–100.0)
Monocytes Absolute: 0.1 10*3/uL (ref 0.1–1.0)
Monocytes Relative: 1 %
Neutro Abs: 4.5 10*3/uL (ref 1.7–7.7)
Neutrophils Relative %: 78 %
Platelets: 89 10*3/uL — ABNORMAL LOW (ref 150–400)
RBC: 3.77 MIL/uL — ABNORMAL LOW (ref 3.87–5.11)
RDW: 13.8 % (ref 11.5–15.5)
WBC: 5.7 10*3/uL (ref 4.0–10.5)
nRBC: 0 % (ref 0.0–0.2)

## 2018-03-08 LAB — RAPID URINE DRUG SCREEN, HOSP PERFORMED
Amphetamines: NOT DETECTED
BENZODIAZEPINES: NOT DETECTED
Barbiturates: NOT DETECTED
Cocaine: NOT DETECTED
Opiates: NOT DETECTED
Tetrahydrocannabinol: NOT DETECTED

## 2018-03-08 LAB — COMPREHENSIVE METABOLIC PANEL
ALT: 77 U/L — AB (ref 0–44)
AST: 34 U/L (ref 15–41)
Albumin: 3.8 g/dL (ref 3.5–5.0)
Alkaline Phosphatase: 55 U/L (ref 38–126)
Anion gap: 6 (ref 5–15)
BUN: 17 mg/dL (ref 6–20)
CO2: 26 mmol/L (ref 22–32)
CREATININE: 0.85 mg/dL (ref 0.44–1.00)
Calcium: 8.4 mg/dL — ABNORMAL LOW (ref 8.9–10.3)
Chloride: 101 mmol/L (ref 98–111)
GFR calc Af Amer: >60 mL/min (ref >60)
GFR calc non Af Amer: >60 mL/min (ref >60)
Glucose, Bld: 125 mg/dL — ABNORMAL HIGH (ref 70–99)
Potassium: 3.8 mmol/L (ref 3.5–5.1)
Sodium: 133 mmol/L — ABNORMAL LOW (ref 135–145)
Total Bilirubin: 1.5 mg/dL — ABNORMAL HIGH (ref 0.3–1.2)
Total Protein: 6.5 g/dL (ref 6.5–8.1)

## 2018-03-08 LAB — MAGNESIUM: Magnesium: 2.3 mg/dL (ref 1.7–2.4)

## 2018-03-08 MED ORDER — ONDANSETRON HCL 4 MG PO TABS: 8.0000 mg | ORAL_TABLET | Freq: Two times a day (BID) | ORAL | Status: DC | PRN

## 2018-03-08 MED ORDER — VITAMIN D 25 MCG (1000 UNIT) PO TABS
1000.0000 [IU] | ORAL_TABLET | Freq: Every day | ORAL | Status: DC
  Administered 2018-03-09 – 2018-03-10 (×2): 1000 [IU] via ORAL
  Filled 2018-03-08 (×2): qty 1

## 2018-03-08 MED ORDER — ADULT MULTIVITAMIN W/MINERALS CH
1.0000 | ORAL_TABLET | Freq: Every day | ORAL | Status: DC
  Administered 2018-03-09 – 2018-03-10 (×2): 1 via ORAL
  Filled 2018-03-08 (×2): qty 1

## 2018-03-08 MED ORDER — GADOBUTROL 1 MMOL/ML IV SOLN
8.0000 mL | Freq: Once | INTRAVENOUS | Status: AC | PRN
  Administered 2018-03-08: 8 mL via INTRAVENOUS

## 2018-03-08 MED ORDER — LORAZEPAM 0.5 MG PO TABS
0.5000 mg | ORAL_TABLET | Freq: Every evening | ORAL | Status: DC | PRN
  Administered 2018-03-09: 0.5 mg via ORAL
  Filled 2018-03-08: qty 1

## 2018-03-08 MED ORDER — PROCHLORPERAZINE MALEATE 10 MG PO TABS: 10.0000 mg | ORAL_TABLET | Freq: Four times a day (QID) | ORAL | Status: DC | PRN

## 2018-03-08 MED ORDER — MULTIVITAMINS PO CAPS: 1.0000 | ORAL_CAPSULE | Freq: Every day | ORAL | Status: DC

## 2018-03-08 MED ORDER — ALBUTEROL SULFATE HFA 108 (90 BASE) MCG/ACT IN AERS
1.0000 | INHALATION_SPRAY | Freq: Four times a day (QID) | RESPIRATORY_TRACT | Status: DC | PRN
  Filled 2018-03-08: qty 6.7

## 2018-03-08 MED ORDER — ALBUTEROL SULFATE (2.5 MG/3ML) 0.083% IN NEBU: 2.5000 mg | INHALATION_SOLUTION | Freq: Four times a day (QID) | RESPIRATORY_TRACT | Status: DC | PRN

## 2018-03-08 MED ORDER — SODIUM CHLORIDE 0.9% FLUSH
10.0000 mL | INTRAVENOUS | Status: DC | PRN
  Administered 2018-03-10: 10 mL
  Filled 2018-03-08: qty 40

## 2018-03-08 MED ORDER — POTASSIUM CHLORIDE CRYS ER 20 MEQ PO TBCR
20.0000 meq | EXTENDED_RELEASE_TABLET | Freq: Every day | ORAL | Status: DC
  Administered 2018-03-09 – 2018-03-10 (×2): 20 meq via ORAL
  Filled 2018-03-08 (×2): qty 1

## 2018-03-08 MED ORDER — SODIUM CHLORIDE 0.9 % IV SOLN
INTRAVENOUS | Status: DC
  Administered 2018-03-09 (×2): via INTRAVENOUS

## 2018-03-08 MED ORDER — LACTATED RINGERS IV BOLUS
1000.0000 mL | Freq: Once | INTRAVENOUS | Status: AC
  Administered 2018-03-08: 1000 mL via INTRAVENOUS

## 2018-03-08 MED ORDER — TRAMADOL HCL 50 MG PO TABS
50.0000 mg | ORAL_TABLET | Freq: Four times a day (QID) | ORAL | Status: DC | PRN
  Administered 2018-03-08: 50 mg via ORAL
  Administered 2018-03-09: 100 mg via ORAL
  Filled 2018-03-08: qty 1
  Filled 2018-03-08: qty 2

## 2018-03-08 NOTE — ED Triage Notes (Signed)
Pt arrives via EMS from home. Pt is a cancer pt. Pt dx with breast cancer. Pt reports a syncopal episode this am. Pt reports she was dx with hypokalemia, pt received K+ infusion yesterday in the [facility] and was dc'ed home.

## 2018-03-08 NOTE — ED Notes (Signed)
ED TO INPATIENT HANDOFF REPORT  Name/Age/Gender Christy Serrano 62 y.o. female  Code Status    Code Status Orders  (From admission, onward)         Start     Ordered   03/08/18 1752  Full code  Continuous     03/08/18 1751        Code Status History    This patient has a current code status but no historical code status.      Home/SNF/Other Given to floor  Chief Complaint syncope  Level of Care/Admitting Diagnosis ED Disposition    ED Disposition Condition [facility] Area: [facility] [[id number]]  Level of Care: Telemetry [5]  Admit to tele based on following criteria: Eval of Syncope  Diagnosis: Recurrent syncope [1610960]  Admitting Physician: Florencia Reasons [[id number]]  Attending Physician: Florencia Reasons [[id number]]  PT Class (Do Not Modify): Observation [104]  PT Acc Code (Do Not Modify): Observation [10022]       Medical History Past Medical History:  Diagnosis Date  . Family history of breast cancer   . Family history of ovarian cancer   . Family history of prostate cancer   . Lactose intolerance   . PONV (postoperative nausea and vomiting)     Allergies Allergies  Allergen Reactions  . Oxycodone-Acetaminophen Other (See Comments) and Palpitations    tachycardia   . Amoxicillin Rash    DID THE REACTION INVOLVE: Swelling of the face/tongue/throat, SOB, or low BP? No Sudden or severe rash/hives, skin peeling, or the inside of the mouth or nose? No Did it require medical treatment? No When did it last happen?in her 55's If all above answers are "NO", may proceed with cephalosporin use.     IV Location/Drains/Wounds Patient Lines/Drains/Airways Status   Active Line/Drains/Airways    Name:   Placement date:   Placement time:   Site:   Days:   Implanted Port 03/03/18 Right Chest   03/03/18    0930    Chest   5   Peripheral IV 03/07/18 Right Hand   03/07/18    1057    Hand   1   Peripheral IV 03/08/18 Right  Antecubital   03/08/18    -    Antecubital   less than 1   Incision (Closed) 03/03/18 Chest Right   03/03/18    0926     5          Labs/Imaging Results for orders placed or performed during the hospital encounter of 03/08/18 (from the past 48 hour(s))  Comprehensive metabolic panel     Status: Abnormal   Collection Time: 03/08/18 12:21 PM  Result Value Ref Range   Sodium 133 (L) 135 - 145 mmol/L   Potassium 3.8 3.5 - 5.1 mmol/L    Comment: DELTA CHECK NOTED REPEATED TO VERIFY NO VISIBLE HEMOLYSIS    Chloride 101 98 - 111 mmol/L   CO2 26 22 - 32 mmol/L   Glucose, Bld 125 (H) 70 - 99 mg/dL   BUN 17 6 - 20 mg/dL   Creatinine, Ser 0.85 0.44 - 1.00 mg/dL   Calcium 8.4 (L) 8.9 - 10.3 mg/dL   Total Protein 6.5 6.5 - 8.1 g/dL   Albumin 3.8 3.5 - 5.0 g/dL   AST 34 15 - 41 U/L   ALT 77 (H) 0 - 44 U/L   Alkaline Phosphatase 55 38 - 126 U/L   Total Bilirubin 1.5 (H) 0.3 - 1.2  mg/dL   GFR calc non Af Amer >60 >60 mL/min   GFR calc Af Amer >60 >60 mL/min   Anion gap 6 5 - 15    Comment: Performed at [facility], [place] [address] [place], [place] [address]  CBC with Differential     Status: Abnormal   Collection Time: 03/08/18 12:21 PM  Result Value Ref Range   WBC 5.7 4.0 - 10.5 K/uL   RBC 3.77 (L) 3.87 - 5.11 MIL/uL   Hemoglobin 11.3 (L) 12.0 - 15.0 g/dL   HCT 36.1 36.0 - 46.0 %   MCV 95.8 80.0 - 100.0 fL   MCH 30.0 26.0 - 34.0 pg   MCHC 31.3 30.0 - 36.0 g/dL   RDW 13.8 11.5 - 15.5 %   Platelets 89 (L) 150 - 400 K/uL    Comment: REPEATED TO VERIFY Immature Platelet Fraction may be clinically indicated, consider ordering this additional test [id number] CONSISTENT WITH PREVIOUS RESULT    nRBC 0.0 0.0 - 0.2 %   Neutrophils Relative % 78 %   Neutro Abs 4.5 1.7 - 7.7 K/uL   Lymphocytes Relative 13 %   Lymphs Abs 0.7 0.7 - 4.0 K/uL   Monocytes Relative 1 %   Monocytes Absolute 0.1 0.1 - 1.0 K/uL   Eosinophils Relative 1 %   Eosinophils Absolute 0.0 0.0 -  0.5 K/uL   Basophils Relative 1 %   Basophils Absolute 0.1 0.0 - 0.1 K/uL   WBC Morphology MILD LEFT SHIFT (1-5% METAS, OCC MYELO, OCC BANDS)    Immature Granulocytes 6 %   Abs Immature Granulocytes 0.37 (H) 0.00 - 0.07 K/uL    Comment: Performed at [facility], [place] [address], [place], [place] [address]  Magnesium     Status: None   Collection Time: 03/08/18 12:21 PM  Result Value Ref Range   Magnesium 2.3 1.7 - 2.4 mg/dL    Comment: Performed at [facility], [place] [address] [place], [place] [address]  Urinalysis, Routine w reflex microscopic     Status: Abnormal   Collection Time: 03/08/18 12:21 PM  Result Value Ref Range   Color, Urine STRAW (A) YELLOW   APPearance CLEAR CLEAR   Specific Gravity, Urine 1.004 (L) 1.005 - 1.030   pH 8.0 5.0 - 8.0   Glucose, UA NEGATIVE NEGATIVE mg/dL   Hgb urine dipstick NEGATIVE NEGATIVE   Bilirubin Urine NEGATIVE NEGATIVE   Ketones, ur NEGATIVE NEGATIVE mg/dL   Protein, ur NEGATIVE NEGATIVE mg/dL   Nitrite NEGATIVE NEGATIVE   Leukocytes, UA NEGATIVE NEGATIVE    Comment: Performed at [place] [address], [place],  [address]  Urine rapid drug screen (hosp performed)     Status: None   Collection Time: 03/08/18 12:21 PM  Result Value Ref Range   Opiates NONE DETECTED NONE DETECTED   Cocaine NONE DETECTED NONE DETECTED   Benzodiazepines NONE DETECTED NONE DETECTED   Amphetamines NONE DETECTED NONE DETECTED   Tetrahydrocannabinol NONE DETECTED NONE DETECTED   Barbiturates NONE DETECTED NONE DETECTED    Comment: (NOTE) DRUG SCREEN FOR MEDICAL PURPOSES ONLY.  IF CONFIRMATION IS NEEDED FOR ANY PURPOSE, NOTIFY LAB WITHIN 5 DAYS. LOWEST DETECTABLE LIMITS FOR URINE DRUG SCREEN Drug Class                     Cutoff (ng/mL) Amphetamine and metabolites    1000 Barbiturate and metabolites    200 Benzodiazepine  315 Tricyclics and metabolites     300 Opiates  and metabolites        300 Cocaine and metabolites        300 THC                            50 Performed at [facility], [place] [address], [place], [place] [address]    Ct Head Wo Contrast  Result Date: 03/08/2018 CLINICAL DATA:  Questionable seizure versus syncope. Breast carcinoma EXAM: CT HEAD WITHOUT CONTRAST TECHNIQUE: Contiguous axial images were obtained from the base of the skull through the vertex without intravenous contrast. COMPARISON:  None. FINDINGS: Brain: The ventricles are normal in size and configuration. There is no intracranial mass, hemorrhage, extra-axial fluid collection, or midline shift. There is mild patchy decreased attenuation in the centra semiovale, likely small vessel disease. No acute infarct is evident. Brain parenchyma otherwise appears unremarkable. Vascular: No evident vascular calcification.  No hyperdense vessel. Skull: Bony calvarium appears intact. Sinuses/Orbits: There is mucosal thickening in several ethmoid air cells. Other visualized paranasal sinuses are clear. Visualized orbits appear symmetric bilaterally. Other: Mastoid air cells are clear. There is an apparent sebaceous cyst in the right parietal region measuring 1.0 x 0.8 cm. IMPRESSION: Mild presumed periventricular small vessel disease. No mass or hemorrhage evident. No appreciable edema. Mucosal thickening noted in several ethmoid air cells. Electronically Signed   By: Lowella Grip III M.D.   On: 03/08/2018 13:46   US Abdomen Limited Ruq  Result Date: 03/08/2018 CLINICAL DATA:  Elevated liver function tests. 12.6 cm hypodense lesion in the liver on a recent chest, abdomen and pelvis CT. Left breast cancer diagnosed in October 2019. EXAM: ULTRASOUND ABDOMEN LIMITED RIGHT UPPER QUADRANT COMPARISON:  None. FINDINGS: Gallbladder: No gallstones or wall thickening visualized. No sonographic Murphy sign noted by sonographer. Common bile duct: Diameter: 3.4 mm Liver: 1.6 cm right  lobe liver cyst. This has ultrasound features of a simple cyst without internal vascularity with color Doppler. This corresponds to the recently demonstrated liver lesion. The liver is normal in echotexture. No intrahepatic biliary ductal dilatation is seen. Portal vein is patent on color Doppler imaging with normal direction of blood flow towards the liver. IMPRESSION: 1. 1.6 cm right lobe liver cyst. 2. Otherwise, normal examination. Electronically Signed   By: Claudie Revering M.D.   On: 03/08/2018 18:13    Pending Labs Unresulted Labs (From admission, onward)    Start     Ordered   03/09/18 0500  CBC  Tomorrow morning,   R     03/08/18 1751   03/09/18 0500  Comprehensive metabolic panel  Tomorrow morning,   R     03/08/18 1751   03/09/18 0500  Magnesium  Tomorrow morning,   R     03/08/18 1751   03/08/18 1752  HIV antibody (Routine Testing)  Once,   R     03/08/18 1751   03/08/18 1704  Culture, blood (routine x 2)  BLOOD CULTURE X 2,   R     03/08/18 1703          Vitals/Pain Today's Vitals   03/08/18 1412 03/08/18 1430 03/08/18 1630 03/08/18 1700  BP:  137/73 136/76 126/74  Pulse: 70 (!) 57 65 69  Resp: 17 (!) 21 16 12   Temp:      TempSrc:      SpO2: 100% 100% 100% 100%  Weight:  PainSc:        Isolation Precautions No active isolations  Medications Medications  0.9 %  sodium chloride infusion (has no administration in time range)  traMADol (ULTRAM) tablet 50-100 mg (has no administration in time range)  LORazepam (ATIVAN) tablet 0.5 mg (has no administration in time range)  prochlorperazine (COMPAZINE) tablet 10 mg (has no administration in time range)  ondansetron (ZOFRAN) tablet 8 mg (has no administration in time range)  cholecalciferol (VITAMIN D) tablet 1,000 Units (has no administration in time range)  multivitamin capsule 1 capsule (has no administration in time range)  potassium chloride SA (K-DUR,KLOR-CON) CR tablet 20 mEq (has no administration in time  range)  albuterol (PROVENTIL HFA;VENTOLIN HFA) 108 (90 Base) MCG/ACT inhaler 1-2 puff (has no administration in time range)  lactated ringers bolus 1,000 mL (1,000 mLs Intravenous New Bag/Given (Non-Interop) 03/08/18 1323)  gadobutrol (GADAVIST) 1 MMOL/ML injection 8 mL (8 mLs Intravenous Contrast Given 03/08/18 1902)    Mobility walks with person assist

## 2018-03-08 NOTE — ED Notes (Signed)
Bed: TD97 Expected date:  Expected time:  Means of arrival:  Comments: EMS syncope hypokalemia

## 2018-03-08 NOTE — ED Provider Notes (Signed)
[place] DEPT Provider Note   CSN: [id number] Arrival date & time: 03/08/18  1111     History   Chief Complaint Chief Complaint  Patient presents with  . Loss of Consciousness  . Abnormal Lab    HPI Christy Serrano is a 62 y.o. female.  HPI  62 year old female currently being treated for breast cancer with chemotherapy presents with syncope.  Her first chemotherapy was last week.  She was seen here yesterday for lightheadedness and then syncope.  She was told she had hypokalemia and given potassium.  She took her potassium this morning and after eating breakfast she all of a sudden felt lightheaded and about a minute or less later passed out.  Family at the bedside states that she started twitching on and off for up to 5 minutes.  She progressively became more and more awake.  She had a little bit of finger twitching on the left side yesterday but no systemic twitching in her episode.  Both episodes occurred with breakfast although she was eating different things and had barely eaten yesterday while she finished her full meal today.  She has a little bit of an occipital headache that is mild starting after the episode today.  She had a frontal headache yesterday after the episode.  No chest pain, shortness of breath, palpitations, or vomiting/diarrhea.  Past Medical History:  Diagnosis Date  . Family history of breast cancer   . Family history of ovarian cancer   . Family history of prostate cancer   . Lactose intolerance   . PONV (postoperative nausea and vomiting)     Patient Active Problem List   Diagnosis Date Noted  . Recurrent syncope 03/08/2018  . Port-A-Cath in place 03/04/2018  . Genetic testing 02/23/2018  . Family history of breast cancer   . Family history of prostate cancer   . Family history of ovarian cancer   . Malignant neoplasm of upper-outer quadrant of left breast in female, estrogen receptor negative ([place]) 02/03/2018  .  Prediabetes 01/06/2018  . Reactive airway disease without complication 59/56/3875  . Class 1 obesity with serious comorbidity and body mass index (BMI) of 32.0 to 32.9 in adult 12/22/2017    Past Surgical History:  Procedure Laterality Date  . ABDOMINAL HYSTERECTOMY    . PORTACATH PLACEMENT Right 03/03/2018   Procedure: INSERTION PORT-A-CATH;  Surgeon: Jovita Kussmaul, MD;  Location: [facility];  Service: General;  Laterality: Right;  . ROTATOR CUFF REPAIR    . TOTAL ABDOMINAL HYSTERECTOMY       OB History    Gravida  1   Para  1   Term      Preterm      AB      Living        SAB      TAB      Ectopic      Multiple      Live Births               Home Medications    Prior to Admission medications   Medication Sig Start Date End Date Taking? Authorizing Provider  acetaminophen (TYLENOL) 500 MG tablet Take 1,000 mg by mouth every 6 (six) hours as needed for mild pain.   Yes [provider]  albuterol (PROVENTIL HFA;VENTOLIN HFA) 108 (90 Base) MCG/ACT inhaler Inhale into the lungs every 6 (six) hours as needed for wheezing or shortness of breath.   Yes [provider]  Calcium-Phosphorus-Vitamin D (CITRACAL +D3 PO) Take 1,200 mcg by mouth.   Yes [provider]  cholecalciferol (VITAMIN D) 1000 units tablet Take 1,000 Units by mouth daily.   Yes [provider]  dexamethasone (DECADRON) 4 MG tablet Take 1 tablet (4 mg total) by mouth daily. Take 1 tablet day before chemo and 1 tablet day after with food 02/08/18  Yes Nicholas Lose, MD  estradiol (VIVELLE-DOT) 0.075 MG/24HR Place 1 patch onto the skin 2 (two) times a week.  01/05/18  Yes [provider]  lidocaine-prilocaine (EMLA) cream Apply to affected area once 02/08/18  Yes Nicholas Lose, MD  LORazepam (ATIVAN) 0.5 MG tablet Take 1 tablet (0.5 mg total) by mouth at bedtime as needed (Nausea or vomiting). 02/08/18  Yes Nicholas Lose, MD  Multiple Vitamin  (MULTIVITAMIN) capsule Take 1 capsule by mouth daily.   Yes [provider]  ondansetron (ZOFRAN) 8 MG tablet Take 1 tablet (8 mg total) by mouth 2 (two) times daily as needed (Nausea or vomiting). Begin 4 days after chemotherapy. 02/08/18  Yes Nicholas Lose, MD  potassium chloride SA (K-DUR,KLOR-CON) 20 MEQ tablet Take 1 tablet (20 mEq total) by mouth daily. 03/07/18  Yes Drenda Freeze, MD  prochlorperazine (COMPAZINE) 10 MG tablet Take 1 tablet (10 mg total) by mouth every 6 (six) hours as needed (Nausea or vomiting). 02/08/18  Yes Nicholas Lose, MD  traMADol (ULTRAM) 50 MG tablet Take 1-2 tablets (50-100 mg total) by mouth every 6 (six) hours as needed. 03/03/18  Yes Jovita Kussmaul, MD    Family History Family History  Problem Relation Age of Onset  . High blood pressure Mother   . High blood pressure Father   . Prostate cancer Father        dx mid 59s  . Breast cancer Sister 28       negative [employer] 46 gene panel  . Breast cancer Paternal Aunt        dx mid 18s  . Cancer Paternal Uncle        jaw cancer d. 4s  . Ovarian cancer Maternal Grandmother        d. 69s  . Cancer Paternal Grandmother        either stomach or ovarian cancer  . Breast cancer Sister 60       "negative 21 gene panel"    Social History Social History   Tobacco Use  . Smoking status: Never Smoker  . Smokeless tobacco: Never Used  Substance Use Topics  . Alcohol use: No  . Drug use: No     Allergies   Oxycodone-acetaminophen and Amoxicillin   Review of Systems Review of Systems  Constitutional: Negative for fever.  Respiratory: Negative for shortness of breath.   Cardiovascular: Negative for chest pain and palpitations.  Gastrointestinal: Negative for abdominal pain, diarrhea and vomiting.  Neurological: Positive for syncope, light-headedness and headaches.  All other systems reviewed and are negative.    Physical Exam Updated Vital Signs BP 137/73   Pulse (!) 57   Temp  98.7 F (37.1 C) (Oral)   Resp (!) 21   Wt 83.5 kg   SpO2 100%   BMI 30.62 kg/m   Physical Exam Vitals signs and nursing note reviewed.  Constitutional:      General: She is not in acute distress.    Appearance: She is well-developed. She is not ill-appearing, toxic-appearing or diaphoretic.  HENT:     Head: Normocephalic and atraumatic.  Right Ear: External ear normal.     Left Ear: External ear normal.     Nose: Nose normal.  Eyes:     General:        Right eye: No discharge.        Left eye: No discharge.     Extraocular Movements: Extraocular movements intact.     Pupils: Pupils are equal, round, and reactive to light.  Cardiovascular:     Rate and Rhythm: Normal rate and regular rhythm.     Heart sounds: Normal heart sounds.  Pulmonary:     Effort: Pulmonary effort is normal.     Breath sounds: Normal breath sounds.  Abdominal:     Palpations: Abdomen is soft.     Tenderness: There is no abdominal tenderness.  Skin:    General: Skin is warm and dry.  Neurological:     Mental Status: She is alert.     Comments: CN 3-12 grossly intact. 5/5 strength in all 4 extremities. Grossly normal sensation. Normal finger to nose.   Psychiatric:        Mood and Affect: Mood is not anxious.      ED Treatments / Results  Labs (all labs ordered are listed, but only abnormal results are displayed) Labs Reviewed  COMPREHENSIVE METABOLIC PANEL - Abnormal; Notable for the following components:      Result Value   Sodium 133 (*)    Glucose, Bld 125 (*)    Calcium 8.4 (*)    ALT 77 (*)    Total Bilirubin 1.5 (*)    All other components within normal limits  CBC WITH DIFFERENTIAL/PLATELET - Abnormal; Notable for the following components:   RBC 3.77 (*)    Hemoglobin 11.3 (*)    Platelets 89 (*)    Abs Immature Granulocytes 0.37 (*)    All other components within normal limits  URINALYSIS, ROUTINE W REFLEX MICROSCOPIC - Abnormal; Notable for the following components:    Color, Urine STRAW (*)    Specific Gravity, Urine 1.004 (*)    All other components within normal limits  MAGNESIUM  RAPID URINE DRUG SCREEN, HOSP PERFORMED    EKG EKG Interpretation  Date/Time:  Monday March 08 2018 11:30:12 EST Ventricular Rate:  65 PR Interval:    QRS Duration: 92 QT Interval:  428 QTC Calculation: 445 R Axis:   43 Text Interpretation:  Sinus rhythm Abnormal R-wave progression, early transition Confirmed by Sherwood Gambler [phone]) on 03/08/2018 12:13:56 PM Also confirmed by Sherwood Gambler [phone]), editor Shon Hale [phone])  on 03/08/2018 1:57:40 PM   Radiology Ct Head Wo Contrast  Result Date: 03/08/2018 CLINICAL DATA:  Questionable seizure versus syncope. Breast carcinoma EXAM: CT HEAD WITHOUT CONTRAST TECHNIQUE: Contiguous axial images were obtained from the base of the skull through the vertex without intravenous contrast. COMPARISON:  None. FINDINGS: Brain: The ventricles are normal in size and configuration. There is no intracranial mass, hemorrhage, extra-axial fluid collection, or midline shift. There is mild patchy decreased attenuation in the centra semiovale, likely small vessel disease. No acute infarct is evident. Brain parenchyma otherwise appears unremarkable. Vascular: No evident vascular calcification.  No hyperdense vessel. Skull: Bony calvarium appears intact. Sinuses/Orbits: There is mucosal thickening in several ethmoid air cells. Other visualized paranasal sinuses are clear. Visualized orbits appear symmetric bilaterally. Other: Mastoid air cells are clear. There is an apparent sebaceous cyst in the right parietal region measuring 1.0 x 0.8 cm. IMPRESSION: Mild presumed periventricular small vessel disease. No mass  or hemorrhage evident. No appreciable edema. Mucosal thickening noted in several ethmoid air cells. Electronically Signed   By: Lowella Grip III M.D.   On: 03/08/2018 13:46    Procedures Procedures (including critical care  time)  Medications Ordered in ED Medications  lactated ringers bolus 1,000 mL (1,000 mLs Intravenous New Bag/Given (Non-Interop) 03/08/18 1323)     Initial Impression / Assessment and Plan / ED Course  I have reviewed the triage vital signs and the nursing notes.  Pertinent labs & imaging results that were available during my care of the patient were reviewed by me and considered in my medical decision making (see chart for details).     Unclear if the patient is having syncope or possibly seizure-like activity.  Given her cancer history and no prior seizure history, CT head obtained.  This is unremarkable.  Labs are reassuring.  I discussed with Dr. Lorraine Lax of neurology.  He agrees that EEG would be beneficial but the patient does not need to be admitted to [facility] for this.  Also asked for MRI brain with and without.  Syncope is also possible and thus she will need telemetry monitoring and further work-up from the hospitalist service. Admit.  Final Clinical Impressions(s) / ED Diagnoses   Final diagnoses:  Syncope and collapse    ED Discharge Orders    None       Sherwood Gambler, MD 03/08/18 1542

## 2018-03-08 NOTE — ED Notes (Signed)
Patient transported to MRI 

## 2018-03-08 NOTE — H&P (Signed)
History and Physical  Christy Serrano HTX:[id number] DOB: 12-07-1955 DOA: 03/08/2018  Referring physician: EDP PCP: Velna Hatchet, MD   Chief Complaint: Recurrent syncope with jerky movement and loss of consciousness  HPI: Christy Serrano is a 62 y.o. female   Recent diagnosedstage III breast cancer, underwent first treatment on December 12, presented to the emergency room with above complaints.  She passed out with some jerking movement while having breakfast yesterday came to the emergency room, found to have hypokalemia, she received potassium supplement and IV hydration was discharged home.  However this morning she had another episode while she was having breakfast.  She reported loss consciousness for about 5 minutes.  Sister reports observing patient have jerking movement, not responding, drooling and urinary incontinence during the episode this morning.  Patient denies of fever, denies pain.  Does report intermittent leg cramping.  ED course: Vital signs are stable, no fever.  labs remarkable for hemoglobin 11.3, platelet 89, sodium 133, AST 77, total bili 1.5.  CT had no acute findings.  Patient received one liter ivf bolus, EDP discussed with neurology Dr. Lorraine Lax who recommend MRI and EEG, admit to hospitalist team at [facility].  No need to transfer to [facility].  Neurology will see patient in consult here at [facility].  Review of Systems:  Detail per HPI, Review of systems are otherwise negative  Past Medical History:  Diagnosis Date  . Family history of breast cancer   . Family history of ovarian cancer   . Family history of prostate cancer   . Lactose intolerance   . PONV (postoperative nausea and vomiting)    Past Surgical History:  Procedure Laterality Date  . ABDOMINAL HYSTERECTOMY    . PORTACATH PLACEMENT Right 03/03/2018   Procedure: INSERTION PORT-A-CATH;  Surgeon: Jovita Kussmaul, MD;  Location: [facility];  Service: General;  Laterality: Right;  .  ROTATOR CUFF REPAIR    . TOTAL ABDOMINAL HYSTERECTOMY     Social History:  reports that she has never smoked. She has never used smokeless tobacco. She reports that she does not drink alcohol or use drugs. Patient lives at home & is able to participate in activities of daily living independently   Allergies  Allergen Reactions  . Oxycodone-Acetaminophen Other (See Comments) and Palpitations    tachycardia   . Amoxicillin Rash    DID THE REACTION INVOLVE: Swelling of the face/tongue/throat, SOB, or low BP? No Sudden or severe rash/hives, skin peeling, or the inside of the mouth or nose? No Did it require medical treatment? No When did it last happen?in her 88's If all above answers are "NO", may proceed with cephalosporin use.     Family History  Problem Relation Age of Onset  . High blood pressure Mother   . High blood pressure Father   . Prostate cancer Father        dx mid 45s  . Breast cancer Sister 74       negative [employer] 46 gene panel  . Breast cancer Paternal Aunt        dx mid 40s  . Cancer Paternal Uncle        jaw cancer d. 51s  . Ovarian cancer Maternal Grandmother        d. 69s  . Cancer Paternal Grandmother        either stomach or ovarian cancer  . Breast cancer Sister 75       "negative 21 gene panel"  Prior to Admission medications   Medication Sig Start Date End Date Taking? Authorizing Provider  albuterol (PROVENTIL HFA;VENTOLIN HFA) 108 (90 Base) MCG/ACT inhaler Inhale into the lungs every 6 (six) hours as needed for wheezing or shortness of breath.    [provider]  aspirin 81 MG chewable tablet Chew by mouth daily.    [provider]  Calcium-Phosphorus-Vitamin D (CITRACAL +D3 PO) Take 1,200 mcg by mouth.    [provider]  cholecalciferol (VITAMIN D) 1000 units tablet Take 1,000 Units by mouth daily.    [provider]  dexamethasone (DECADRON) 4 MG tablet Take 1 tablet (4 mg total) by mouth daily.  Take 1 tablet day before chemo and 1 tablet day after with food 02/08/18   Nicholas Lose, MD  estradiol (VIVELLE-DOT) 0.075 MG/24HR  01/05/18   [provider]  ibuprofen (ADVIL,MOTRIN) 200 MG tablet Take 200 mg by mouth every 6 (six) hours as needed.    [provider]  lidocaine-prilocaine (EMLA) cream Apply to affected area once 02/08/18   Nicholas Lose, MD  LORazepam (ATIVAN) 0.5 MG tablet Take 1 tablet (0.5 mg total) by mouth at bedtime as needed (Nausea or vomiting). 02/08/18   Nicholas Lose, MD  Multiple Vitamin (MULTIVITAMIN) capsule Take 1 capsule by mouth daily.    [provider]  ondansetron (ZOFRAN) 8 MG tablet Take 1 tablet (8 mg total) by mouth 2 (two) times daily as needed (Nausea or vomiting). Begin 4 days after chemotherapy. 02/08/18   Nicholas Lose, MD  potassium chloride SA (K-DUR,KLOR-CON) 20 MEQ tablet Take 1 tablet (20 mEq total) by mouth daily. 03/07/18   Drenda Freeze, MD  prochlorperazine (COMPAZINE) 10 MG tablet Take 1 tablet (10 mg total) by mouth every 6 (six) hours as needed (Nausea or vomiting). 02/08/18   Nicholas Lose, MD  traMADol (ULTRAM) 50 MG tablet Take 1-2 tablets (50-100 mg total) by mouth every 6 (six) hours as needed. 03/03/18   Jovita Kussmaul, MD    Physical Exam: BP 136/76   Pulse 65   Temp 98.7 F (37.1 C) (Oral)   Resp 16   Wt 83.5 kg   SpO2 100%   BMI 30.62 kg/m   General:  NAD, port a cath right chest, no sign of infection Eyes: PERRL ENT: unremarkable Neck: supple, no JVD Cardiovascular: RRR Respiratory: CTABL Abdomen: soft/ND/ND, positive bowel sounds Skin: no rash Musculoskeletal:  No edema Psychiatric: calm/cooperative Neurologic: no focal findings            Labs on Admission:  Basic Metabolic Panel: Recent Labs  Lab 03/02/18 0828 03/04/18 0815 03/07/18 1147 03/08/18 1221  NA 140 139 136 133*  K 4.1 3.9 3.1* 3.8  CL 105 104 102 101  CO2 _0 GLUCOSE 88 133* 122* 125*  BUN 13  19 23* 17  CREATININE 1.11* 1.06* 1.07* 0.85  CALCIUM 9.3 9.7 8.6* 8.4*  MG  --   --   --  2.3   Liver Function Tests: Recent Labs  Lab 03/02/18 0828 03/04/18 0815 03/07/18 1147 03/08/18 1221  AST 25 28 55* 34  ALT 20 27 103* 77*  ALKPHOS 60 66 53 55  BILITOT 0.6 0.5 1.1 1.5*  PROT 7.5 7.4 6.6 6.5  ALBUMIN 4.0 3.8 3.7 3.8   No results for input(s): LIPASE, AMYLASE in the last 168 hours. No results for input(s): AMMONIA in the last 168 hours. CBC: Recent Labs  Lab 03/04/18 0815 03/07/18 1147 03/08/18  1221  WBC 10.7* 13.0* 5.7  NEUTROABS 9.4* 10.8* 4.5  HGB 10.9* 11.1* 11.3*  HCT 33.8* 35.6* 36.1  MCV 92.3 93.9 95.8  PLT 172 103* 89*   Cardiac Enzymes: No results for input(s): CKTOTAL, CKMB, CKMBINDEX, TROPONINI in the last 168 hours.  BNP (last 3 results) No results for input(s): BNP in the last 8760 hours.  ProBNP (last 3 results) No results for input(s): PROBNP in the last 8760 hours.  CBG: No results for input(s): GLUCAP in the last 168 hours.  Radiological Exams on Admission: Ct Head Wo Contrast  Result Date: 03/08/2018 CLINICAL DATA:  Questionable seizure versus syncope. Breast carcinoma EXAM: CT HEAD WITHOUT CONTRAST TECHNIQUE: Contiguous axial images were obtained from the base of the skull through the vertex without intravenous contrast. COMPARISON:  None. FINDINGS: Brain: The ventricles are normal in size and configuration. There is no intracranial mass, hemorrhage, extra-axial fluid collection, or midline shift. There is mild patchy decreased attenuation in the centra semiovale, likely small vessel disease. No acute infarct is evident. Brain parenchyma otherwise appears unremarkable. Vascular: No evident vascular calcification.  No hyperdense vessel. Skull: Bony calvarium appears intact. Sinuses/Orbits: There is mucosal thickening in several ethmoid air cells. Other visualized paranasal sinuses are clear. Visualized orbits appear symmetric bilaterally.  Other: Mastoid air cells are clear. There is an apparent sebaceous cyst in the right parietal region measuring 1.0 x 0.8 cm. IMPRESSION: Mild presumed periventricular small vessel disease. No mass or hemorrhage evident. No appreciable edema. Mucosal thickening noted in several ethmoid air cells. Electronically Signed   By: Lowella Grip III M.D.   On: 03/08/2018 13:46    EKG: Independently reviewed. Sinus rhythm, no acute st/t changes  Assessment/Plan Present on Admission: . Recurrent syncope  Recurrent syncope with jerky movement/LOC/drooling/urinar incontinence  -Concerning for seizure, she report no prior seizure -seizure precaution -mri/carotic us/EEG, neurology consult called by EDP, Dr Aroor will see patient in consult here at [facility] long. -get orthostatic, ua, blood culture -keep on Tele -She just has echocardiogram on 12/10 prior to breast cancer treatment -she has no hypoxia, no chest pain, will hold on PE work up   Thrombocytopenia -possible from chemo, no sign of bleeding , monitor  lft elevated  Get ab [place] Denies ab pain, repeat lab in am   T3 N1 stage IIIA (ER-, PR-, HER2+), left breast -follow with Dr Lindi Adie -s/p INSERTION PORT-A-CATH (Right) on 12/11, received herceptin, perjecta and taxotere/carbo on 12/12 -received nulasta on 12/14    DVT prophylaxis: scd's due to thrombocytopenia  Consultants: neurology   Code Status: full   Family Communication:  Patient and family  Disposition Plan: med tele obs  Time spent: 85mns  FFlorencia ReasonsMD, PhD [facility] Pager [phone] 7PM-7AM, please contact night-coverage at [email or web address], password [email or web address]

## 2018-03-09 ENCOUNTER — Observation Stay (HOSPITAL_BASED_OUTPATIENT_CLINIC_OR_DEPARTMENT_OTHER): Payer: Managed Care, Other (non HMO)

## 2018-03-09 ENCOUNTER — Ambulatory Visit (INDEPENDENT_AMBULATORY_CARE_PROVIDER_SITE_OTHER): Payer: Managed Care, Other (non HMO) | Admitting: Bariatrics

## 2018-03-09 ENCOUNTER — Observation Stay (HOSPITAL_COMMUNITY)
Admission: EM | Admit: 2018-03-09 | Discharge: 2018-03-09 | Disposition: A | Discharge status: Home / Self Care | Payer: Managed Care, Other (non HMO) | Source: Home / Self Care | Attending: Internal Medicine | Admitting: Internal Medicine

## 2018-03-09 ENCOUNTER — Other Ambulatory Visit: Payer: Self-pay | Admitting: Medical

## 2018-03-09 DIAGNOSIS — R569 Unspecified convulsions: Secondary | ICD-10-CM | POA: Diagnosis not present

## 2018-03-09 DIAGNOSIS — R55 Syncope and collapse: Secondary | ICD-10-CM

## 2018-03-09 DIAGNOSIS — E871 Hypo-osmolality and hyponatremia: Secondary | ICD-10-CM

## 2018-03-09 DIAGNOSIS — C50912 Malignant neoplasm of unspecified site of left female breast: Secondary | ICD-10-CM

## 2018-03-09 DIAGNOSIS — R945 Abnormal results of liver function studies: Secondary | ICD-10-CM | POA: Diagnosis not present

## 2018-03-09 LAB — COMPREHENSIVE METABOLIC PANEL
ALT: 59 U/L — ABNORMAL HIGH (ref 0–44)
AST: 26 U/L (ref 15–41)
Albumin: 3.8 g/dL (ref 3.5–5.0)
Alkaline Phosphatase: 58 U/L (ref 38–126)
Anion gap: 8 (ref 5–15)
BUN: 14 mg/dL (ref 6–20)
CHLORIDE: 101 mmol/L (ref 98–111)
CO2: 24 mmol/L (ref 22–32)
Calcium: 8.4 mg/dL — ABNORMAL LOW (ref 8.9–10.3)
Creatinine, Ser: 0.87 mg/dL (ref 0.44–1.00)
GFR calc Af Amer: >60 mL/min (ref >60)
GFR calc non Af Amer: >60 mL/min (ref >60)
Glucose, Bld: 111 mg/dL — ABNORMAL HIGH (ref 70–99)
Potassium: 3.8 mmol/L (ref 3.5–5.1)
Sodium: 133 mmol/L — ABNORMAL LOW (ref 135–145)
Total Bilirubin: 1.3 mg/dL — ABNORMAL HIGH (ref 0.3–1.2)
Total Protein: 6.9 g/dL (ref 6.5–8.1)

## 2018-03-09 LAB — MAGNESIUM: Magnesium: 2.3 mg/dL (ref 1.7–2.4)

## 2018-03-09 LAB — CBC
HCT: 38 % (ref 36.0–46.0)
Hemoglobin: 12 g/dL (ref 12.0–15.0)
MCH: 30.2 pg (ref 26.0–34.0)
MCHC: 31.6 g/dL (ref 30.0–36.0)
MCV: 95.7 fL (ref 80.0–100.0)
Platelets: 98 10*3/uL — ABNORMAL LOW (ref 150–400)
RBC: 3.97 MIL/uL (ref 3.87–5.11)
RDW: 13.9 % (ref 11.5–15.5)
WBC: 2.7 10*3/uL — ABNORMAL LOW (ref 4.0–10.5)
nRBC: 0 % (ref 0.0–0.2)

## 2018-03-09 MED ORDER — ALUM & MAG HYDROXIDE-SIMETH 200-200-20 MG/5ML PO SUSP
30.0000 mL | Freq: Four times a day (QID) | ORAL | Status: DC | PRN
  Administered 2018-03-09: 30 mL via ORAL
  Filled 2018-03-09: qty 30

## 2018-03-09 MED ORDER — ALUM & MAG HYDROXIDE-SIMETH 200-200-20 MG/5ML PO SUSP
15.0000 mL | Freq: Once | ORAL | Status: AC
  Administered 2018-03-09: 15 mL via ORAL
  Filled 2018-03-09: qty 30

## 2018-03-09 MED ORDER — PANTOPRAZOLE SODIUM 20 MG PO TBEC
20.0000 mg | DELAYED_RELEASE_TABLET | Freq: Every day | ORAL | Status: DC
  Administered 2018-03-09 – 2018-03-10 (×2): 20 mg via ORAL
  Filled 2018-03-09 (×2): qty 1

## 2018-03-09 MED ORDER — SODIUM CHLORIDE 0.9 % IV SOLN
INTRAVENOUS | Status: DC
  Administered 2018-03-10: 02:00:00 via INTRAVENOUS

## 2018-03-09 NOTE — Progress Notes (Signed)
EEG Completed; Results Pending  

## 2018-03-09 NOTE — Procedures (Signed)
History: 62 yo F being evaluated for recurrent syncope  Sedation: none  Technique: This is a 21 channel routine scalp EEG performed at the bedside with bipolar and monopolar montages arranged in accordance to the international 10/20 system of electrode placement. One channel was dedicated to EKG recording.    Background: The background consists of intermixed alpha and beta activities. There is a well defined posterior dominant rhythm of 9 Hz that attenuates with eye opening. Sleep is recorded with normal appearing structures.   Photic stimulation: Physiologic driving is not performed  EEG Abnormalities: None  Clinical Interpretation: This normal EEG is recorded in the waking and sleep state. There was no seizure or seizure predisposition recorded on this study. Please note that lack of epileptiform activity on EEG does not preclude the possibility of epilepsy.   Roland Rack, MD [facility] [phone]  If 7pm- 7am, please page neurology on call as listed in [place].

## 2018-03-09 NOTE — Progress Notes (Addendum)
Carotid duplex has been completed.   Preliminary results in CV Proc.   Abram Sander 03/09/2018 8:27 AM

## 2018-03-09 NOTE — Progress Notes (Signed)
PROGRESS NOTE  [name] COUSINEAU VQQ:[id number] DOB: 1955-06-29 DOA: 03/08/2018 PCP: Velna Hatchet, MD  HPI/Recap of past 24 hours:  Feeling better, no more jerky movement/loc. Remain on hydration, oral intake is marginal , reports lost appetite since started on chemo Sister at bedside  Assessment/Plan: Active Problems:   Recurrent syncope  Recurrent syncope with jerky movement/LOC/drooling/urinar incontinence  -she reports no prior seizure -seizure precaution -she has no hypoxia, no chest pain, less likely PE, will hold on PE work up -She just had an echocardiogram on 12/10 prior to breast cancer treatment which is unremarkable -CT head/mri brain/carotic us/EEG unremarkable, she does not have orthostatic hypotension, ua unremarkable, blood culture no growth, EKG/Tele unremarkable -neurology consulted, think seizure less likely   Per neurology "Recommendations Will not start patient on antiepileptics 30-day cardiac monitor No driving for 6 months in case these were seizures" . Outpatient neurology follow-up family is advised by neurology to record event if happen again  -Outpatient event monitor ( message sent to cardiology through epic) -Am cortisol level ordered ( per  Oncology Dr Lindi Adie recommendation)  Hyponatremia: Mild , likely from dehydration, continue hydration for another 24hrs, repeat lab in am  Thrombocytopenia -possible from chemo, no sign of bleeding , monitor  lft elevated   ab US unremarkable Denies ab pain,  elevated lft likely from chemo    T3 N1 stage IIIA (ER-, PR-, HER2+), left breast -follow with Dr Lindi Adie -s/p INSERTION PORT-A-CATH (Right) on 12/11, received herceptin, perjecta and taxotere/carbo on 12/12 -received nulasta on 12/14    Code Status: full  Family Communication: patient and sister  Disposition Plan: home in am, oncology recommend home health/RN,  ordered   Consultants:  Oncology  neurology  Procedures:  none  Antibiotics:  none   Objective: BP 138/75 (BP Location: Right Arm)   Pulse 72   Temp 98.7 F (37.1 C) (Oral)   Resp 16   Ht 5' 5"  (1.651 m)   Wt 82.4 kg   SpO2 96%   BMI 30.23 kg/m   Intake/Output Summary (Last 24 hours) at 03/09/2018 1420 Last data filed at 03/09/2018 1322 Gross per 24 hour  Intake 560 ml  Output 1400 ml  Net -840 ml   Filed Weights   03/08/18 1127 03/08/18 1900  Weight: 83.5 kg 82.4 kg    Exam: Patient is examined daily including today on 03/09/2018, exams remain the same as of yesterday except that has changed    General:  NAD  Cardiovascular: RRR  Respiratory: CTABL  Abdomen: Soft/ND/NT, positive BS  Musculoskeletal: No Edema  Neuro: alert, oriented   Data Reviewed: Basic Metabolic Panel: Recent Labs  Lab 03/04/18 0815 03/07/18 1147 03/08/18 1221 03/09/18 0929  NA 139 136 133* 133*  K 3.9 3.1* 3.8 3.8  CL 104 102 101 101  CO2 26 27 26 24   GLUCOSE 133* 122* 125* 111*  BUN 19 23* 17 14  CREATININE 1.06* 1.07* 0.85 0.87  CALCIUM 9.7 8.6* 8.4* 8.4*  MG  --   --  2.3 2.3   Liver Function Tests: Recent Labs  Lab 03/04/18 0815 03/07/18 1147 03/08/18 1221 03/09/18 0929  AST 28 55* 34 26  ALT 27 103* 77* 59*  ALKPHOS 66 53 55 58  BILITOT 0.5 1.1 1.5* 1.3*  PROT 7.4 6.6 6.5 6.9  ALBUMIN 3.8 3.7 3.8 3.8   No results for input(s): LIPASE, AMYLASE in the last 168 hours. No results for input(s): AMMONIA in the last 168 hours. CBC: Recent  Labs  Lab 03/04/18 0815 03/07/18 1147 03/08/18 1221 03/09/18 0929  WBC 10.7* 13.0* 5.7 2.7*  NEUTROABS 9.4* 10.8* 4.5  --   HGB 10.9* 11.1* 11.3* 12.0  HCT 33.8* 35.6* 36.1 38.0  MCV 92.3 93.9 95.8 95.7  PLT 172 103* 89* 98*   Cardiac Enzymes:   No results for input(s): CKTOTAL, CKMB, CKMBINDEX, TROPONINI in the last 168 hours. BNP (last 3 results) No results for input(s): BNP in the last 8760  hours.  ProBNP (last 3 results) No results for input(s): PROBNP in the last 8760 hours.  CBG: No results for input(s): GLUCAP in the last 168 hours.  Recent Results (from the past 240 hour(s))  Culture, blood (routine x 2)     Status: None (Preliminary result)   Collection Time: 03/08/18  5:24 PM  Result Value Ref Range Status   Specimen Description   Final    BLOOD RIGHT HAND Performed at [facility] [address] [place], [place] [address]    Special Requests   Final    BOTTLES DRAWN AEROBIC AND ANAEROBIC Blood Culture adequate volume Performed at [facility] [address], [place], [place] [address]    Culture   Final    NO GROWTH < 24 HOURS Performed at [facility] [address], [place], [place] [address]    Report Status PENDING  Incomplete  Culture, blood (routine x 2)     Status: None (Preliminary result)   Collection Time: 03/08/18  5:28 PM  Result Value Ref Range Status   Specimen Description   Final    BLOOD RIGHT HAND Performed at [facility] [address], [place], [place] [address]    Special Requests   Final    BOTTLES DRAWN AEROBIC AND ANAEROBIC Blood Culture adequate volume Performed at [facility] [address] [place], [place] [address]    Culture   Final    NO GROWTH < 24 HOURS Performed at [facility] [address], [place], [place] [address]    Report Status PENDING  Incomplete     Studies: Mr Jeri Cos And Wo Contrast  Result Date: 03/08/2018 CLINICAL DATA:  Breast cancer.  Recent syncopal episodes. EXAM: MRI HEAD WITHOUT AND WITH CONTRAST TECHNIQUE: Multiplanar, multiecho pulse sequences of the brain and surrounding structures were obtained without and with intravenous contrast. CONTRAST:  8 mL Gadavist COMPARISON:  Head CT 03/08/2018 FINDINGS: BRAIN: There is no acute infarct, acute hemorrhage, hydrocephalus or extra-axial collection. The  midline structures are normal. No midline shift or other mass effect. There are no old infarcts. Multifocal white matter hyperintensity, most commonly due to chronic ischemic microangiopathy. The cerebral and cerebellar volume are age-appropriate. Susceptibility-sensitive sequences show no chronic microhemorrhage or superficial siderosis. No abnormal contrast enhancement. VASCULAR: Major intracranial arterial and venous sinus flow voids are normal. SKULL AND UPPER CERVICAL SPINE: Calvarial bone marrow signal is normal. There is no skull base mass. Visualized upper cervical spine and soft tissues are normal. SINUSES/ORBITS: No fluid levels or advanced mucosal thickening. No mastoid or middle ear effusion. The orbits are normal. IMPRESSION: 1. No intracranial metastatic disease. 2. Numerous scattered white matter lesions, most likely indicating chronic small vessel ischemia. Electronically Signed   By: Ulyses Jarred M.D.   On: 03/08/2018 19:40   Vas US Carotid  Result Date: 03/09/2018 Carotid Arterial Duplex Study Indications: Syncope. Performing Technologist: Abram Sander RVS  Examination Guidelines: A complete evaluation includes  B-mode imaging, spectral Doppler, color Doppler, and power Doppler as needed of all accessible portions of each vessel. Bilateral testing is considered an integral part of a complete examination. Limited examinations for reoccurring indications may be performed as noted.  Right Carotid Findings: +----------+--------+--------+--------+------------+--------+           PSV cm/sEDV cm/sStenosisDescribe    Comments +----------+--------+--------+--------+------------+--------+ CCA Prox  82      14              heterogenous         +----------+--------+--------+--------+------------+--------+ CCA Distal90      23              heterogenous         +----------+--------+--------+--------+------------+--------+ ICA Prox  52      14      1-39%   heterogenous          +----------+--------+--------+--------+------------+--------+ ICA Distal58      24                                   +----------+--------+--------+--------+------------+--------+ ECA       76      9                                    +----------+--------+--------+--------+------------+--------+ +----------+--------+-------+------------------------------+-------------------+           PSV cm/sEDV cmsDescribe                      Arm Pressure (mmHG) +----------+--------+-------+------------------------------+-------------------+ Subclavian               not visualized due to port                                                 placement                                         +----------+--------+-------+------------------------------+-------------------+ +---------+--------+--+--------+--+---------+ VertebralPSV cm/s75EDV cm/s22Antegrade +---------+--------+--+--------+--+---------+  Left Carotid Findings: +----------+--------+--------+--------+------------+--------+           PSV cm/sEDV cm/sStenosisDescribe    Comments +----------+--------+--------+--------+------------+--------+ CCA Prox  75      12              heterogenous         +----------+--------+--------+--------+------------+--------+ CCA Distal109     24              heterogenous         +----------+--------+--------+--------+------------+--------+ ICA Prox  84      30      1-39%   heterogenous         +----------+--------+--------+--------+------------+--------+ ICA Distal96      35                                   +----------+--------+--------+--------+------------+--------+ ECA       91      9                                    +----------+--------+--------+--------+------------+--------+ +----------+--------+--------+--------+-------------------+  SubclavianPSV cm/sEDV cm/sDescribeArm Pressure (mmHG) +----------+--------+--------+--------+-------------------+            109                                         +----------+--------+--------+--------+-------------------+ +---------+--------+--+--------+--+---------+ VertebralPSV cm/s57EDV cm/s15Antegrade +---------+--------+--+--------+--+---------+  Summary: Right Carotid: Velocities in the right ICA are consistent with a 1-39% stenosis. Left Carotid: Velocities in the left ICA are consistent with a 1-39% stenosis. Vertebrals: Bilateral vertebral arteries demonstrate antegrade flow. *See table(s) above for measurements and observations.  Electronically signed by Antony Contras MD on 03/09/2018 at 8:32:27 AM.    Final    US Abdomen Limited Ruq  Result Date: 03/08/2018 CLINICAL DATA:  Elevated liver function tests. 12.6 cm hypodense lesion in the liver on a recent chest, abdomen and pelvis CT. Left breast cancer diagnosed in October 2019. EXAM: ULTRASOUND ABDOMEN LIMITED RIGHT UPPER QUADRANT COMPARISON:  None. FINDINGS: Gallbladder: No gallstones or wall thickening visualized. No sonographic Murphy sign noted by sonographer. Common bile duct: Diameter: 3.4 mm Liver: 1.6 cm right lobe liver cyst. This has ultrasound features of a simple cyst without internal vascularity with color Doppler. This corresponds to the recently demonstrated liver lesion. The liver is normal in echotexture. No intrahepatic biliary ductal dilatation is seen. Portal vein is patent on color Doppler imaging with normal direction of blood flow towards the liver. IMPRESSION: 1. 1.6 cm right lobe liver cyst. 2. Otherwise, normal examination. Electronically Signed   By: Claudie Revering M.D.   On: 03/08/2018 18:13    Scheduled Meds: . cholecalciferol  1,000 Units Oral Daily  . multivitamin with minerals  1 tablet Oral Daily  . pantoprazole  20 mg Oral Daily  . potassium chloride SA  20 mEq Oral Daily    Continuous Infusions: . sodium chloride       Time spent: 43mns, case discussed with neurology and oncology I have personally  reviewed and interpreted on  03/09/2018 daily labs, tele strips, imagings as discussed above under date review session and assessment and plans.  I reviewed all nursing notes, pharmacy notes, consultant notes,  vitals, pertinent old records  I have discussed plan of care as described above with RN , patient and family on 03/09/2018   FFlorencia ReasonsMD, PhD  [facility] Pager [phone] If 7PM-7AM, please contact night-coverage at [email or web address], password [email or web address]12/17/2019, 2:20 PM  LOS: 0 days

## 2018-03-09 NOTE — Consult Note (Signed)
Requesting Physician: Dr. Annamaria Boots    Chief Complaint: episodes of passing out  History obtained from: Patient and Chart     HPI:                                                                                                                                       Christy Serrano is an 62 y.o. female with past medical history of breast cancer who was admitted at [facility] for recurrent episodes of passing out with shaking movements.   Patient had her first episode on 03/07/2018 she was eating her breakfast, she suddenly felt lightheaded and felt like passing out.  The patient sister who witnessed the event stated the patient's eyes rolled up and she had twitching movements of her left arm that lasted for about 5 minutes.  She slowly got up and remained confused.  No tongue biting/frothing of the mouth. Work-up in the ER revealed patient was hypokalemic and patient was sent home.  This morning again while eating breakfast, she felt lightheaded.  Patient remembers the room going dark and then next thing she remembers is the sound of the ambulance.  The sister who also witnessed this episode said this time around she was moving all 4 extremities, however there were no rhythmic jerking.  Again there was no tongue bite but the sister feels that she was drooling in her mouth after the episode.  Patient returned to her her normal self in about 5 to 10 minutes after this particular episode.  Patient was admitted for seizure versus syncope work-up.  Patient does not have history of seizures and there is no family history of seizures.    Past Medical History:  Diagnosis Date  . Family history of breast cancer   . Family history of ovarian cancer   . Family history of prostate cancer   . Lactose intolerance   . PONV (postoperative nausea and vomiting)     Past Surgical History:  Procedure Laterality Date  . ABDOMINAL HYSTERECTOMY    . PORTACATH PLACEMENT Right 03/03/2018   Procedure:  INSERTION PORT-A-CATH;  Surgeon: Jovita Kussmaul, MD;  Location: [place];  Service: General;  Laterality: Right;  . ROTATOR CUFF REPAIR    . TOTAL ABDOMINAL HYSTERECTOMY      Family History  Problem Relation Age of Onset  . High blood pressure Mother   . High blood pressure Father   . Prostate cancer Father        dx mid 87s  . Breast cancer Sister 84       negative [employer] 46 gene panel  . Breast cancer Paternal Aunt        dx mid 94s  . Cancer Paternal Uncle        jaw cancer d. 73s  . Ovarian cancer Maternal Grandmother        d. 96s  . Cancer  Paternal Grandmother        either stomach or ovarian cancer  . Breast cancer Sister 68       "negative 21 gene panel"   Social History:  reports that she has never smoked. She has never used smokeless tobacco. She reports that she does not drink alcohol or use drugs.  Allergies:  Allergies  Allergen Reactions  . Oxycodone-Acetaminophen Other (See Comments) and Palpitations    tachycardia   . Amoxicillin Rash    DID THE REACTION INVOLVE: Swelling of the face/tongue/throat, SOB, or low BP? No Sudden or severe rash/hives, skin peeling, or the inside of the mouth or nose? No Did it require medical treatment? No When did it last happen?in her 66's If all above answers are "NO", may proceed with cephalosporin use.     Medications:                                                                                                                        I reviewed home medications   ROS:                                                                                                                                     14 systems reviewed and negative except above    Examination:                                                                                                      General: Appears well-developed and well-nourished.  Psych: Affect appropriate to situation Eyes: No scleral injection HENT: No OP  obstrucion Head: Normocephalic.  Cardiovascular: Normal rate and regular rhythm.  Respiratory: Effort normal and breath sounds normal to anterior ascultation GI: Soft.  No distension. There is no tenderness.  Skin: WDI    Neurological Examination Mental Status: Alert, oriented, thought content appropriate.  Speech fluent without evidence of aphasia. Able to follow 3 step commands without difficulty. Cranial Nerves: II: Visual fields grossly normal,  III,IV,  VI: ptosis not present, extra-ocular motions intact bilaterally, pupils equal, round, reactive to light and accommodation V,VII: smile symmetric, facial light touch sensation normal bilaterally VIII: hearing normal bilaterally IX,X: uvula rises symmetrically XI: bilateral shoulder shrug XII: midline tongue extension Motor: Right : Upper extremity   5/5    Left:     Upper extremity   5/5  Lower extremity   5/5     Lower extremity   5/5 Tone and bulk:normal tone throughout; no atrophy noted Sensory: Pinprick and light touch intact throughout, bilaterally Deep Tendon Reflexes: 2+ and symmetric throughout Plantars: Right: downgoing   Left: downgoing Cerebellar: normal finger-to-nose, normal rapid alternating movements and normal heel-to-shin test Gait: normal gait and station     Lab Results: Basic Metabolic Panel: Recent Labs  Lab 03/04/18 0815 03/07/18 1147 03/08/18 1221 03/09/18 0929  NA 139 136 133* 133*  K 3.9 3.1* 3.8 3.8  CL 104 102 101 101  CO2 26 27 26 24   GLUCOSE 133* 122* 125* 111*  BUN 19 23* 17 14  CREATININE 1.06* 1.07* 0.85 0.87  CALCIUM 9.7 8.6* 8.4* 8.4*  MG  --   --  2.3 2.3    CBC: Recent Labs  Lab 03/04/18 0815 03/07/18 1147 03/08/18 1221 03/09/18 0929  WBC 10.7* 13.0* 5.7 2.7*  NEUTROABS 9.4* 10.8* 4.5  --   HGB 10.9* 11.1* 11.3* 12.0  HCT 33.8* 35.6* 36.1 38.0  MCV 92.3 93.9 95.8 95.7  PLT 172 103* 89* 98*    Coagulation Studies: No results for input(s): LABPROT, INR in the last  72 hours.  Imaging: Ct Head Wo Contrast  Result Date: 03/08/2018 CLINICAL DATA:  Questionable seizure versus syncope. Breast carcinoma EXAM: CT HEAD WITHOUT CONTRAST TECHNIQUE: Contiguous axial images were obtained from the base of the skull through the vertex without intravenous contrast. COMPARISON:  None. FINDINGS: Brain: The ventricles are normal in size and configuration. There is no intracranial mass, hemorrhage, extra-axial fluid collection, or midline shift. There is mild patchy decreased attenuation in the centra semiovale, likely small vessel disease. No acute infarct is evident. Brain parenchyma otherwise appears unremarkable. Vascular: No evident vascular calcification.  No hyperdense vessel. Skull: Bony calvarium appears intact. Sinuses/Orbits: There is mucosal thickening in several ethmoid air cells. Other visualized paranasal sinuses are clear. Visualized orbits appear symmetric bilaterally. Other: Mastoid air cells are clear. There is an apparent sebaceous cyst in the right parietal region measuring 1.0 x 0.8 cm. IMPRESSION: Mild presumed periventricular small vessel disease. No mass or hemorrhage evident. No appreciable edema. Mucosal thickening noted in several ethmoid air cells. Electronically Signed   By: Lowella Grip III M.D.   On: 03/08/2018 13:46   Mr Jeri Cos And Wo Contrast  Result Date: 03/08/2018 CLINICAL DATA:  Breast cancer.  Recent syncopal episodes. EXAM: MRI HEAD WITHOUT AND WITH CONTRAST TECHNIQUE: Multiplanar, multiecho pulse sequences of the brain and surrounding structures were obtained without and with intravenous contrast. CONTRAST:  8 mL Gadavist COMPARISON:  Head CT 03/08/2018 FINDINGS: BRAIN: There is no acute infarct, acute hemorrhage, hydrocephalus or extra-axial collection. The midline structures are normal. No midline shift or other mass effect. There are no old infarcts. Multifocal white matter hyperintensity, most commonly due to chronic ischemic  microangiopathy. The cerebral and cerebellar volume are age-appropriate. Susceptibility-sensitive sequences show no chronic microhemorrhage or superficial siderosis. No abnormal contrast enhancement. VASCULAR: Major intracranial arterial and venous sinus flow voids are normal. SKULL AND UPPER CERVICAL SPINE: Calvarial bone marrow signal is normal. There  is no skull base mass. Visualized upper cervical spine and soft tissues are normal. SINUSES/ORBITS: No fluid levels or advanced mucosal thickening. No mastoid or middle ear effusion. The orbits are normal. IMPRESSION: 1. No intracranial metastatic disease. 2. Numerous scattered white matter lesions, most likely indicating chronic small vessel ischemia. Electronically Signed   By: Ulyses Jarred M.D.   On: 03/08/2018 19:40   Vas US Carotid  Result Date: 03/09/2018 Carotid Arterial Duplex Study Indications: Syncope. Performing Technologist: Abram Sander RVS  Examination Guidelines: A complete evaluation includes B-mode imaging, spectral Doppler, color Doppler, and power Doppler as needed of all accessible portions of each vessel. Bilateral testing is considered an integral part of a complete examination. Limited examinations for reoccurring indications may be performed as noted.  Right Carotid Findings: +----------+--------+--------+--------+------------+--------+           PSV cm/sEDV cm/sStenosisDescribe    Comments +----------+--------+--------+--------+------------+--------+ CCA Prox  82      14              heterogenous         +----------+--------+--------+--------+------------+--------+ CCA Distal90      23              heterogenous         +----------+--------+--------+--------+------------+--------+ ICA Prox  52      14      1-39%   heterogenous         +----------+--------+--------+--------+------------+--------+ ICA Distal58      24                                    +----------+--------+--------+--------+------------+--------+ ECA       76      9                                    +----------+--------+--------+--------+------------+--------+ +----------+--------+-------+------------------------------+-------------------+           PSV cm/sEDV cmsDescribe                      Arm Pressure (mmHG) +----------+--------+-------+------------------------------+-------------------+ Subclavian               not visualized due to port                                                 placement                                         +----------+--------+-------+------------------------------+-------------------+ +---------+--------+--+--------+--+---------+ VertebralPSV cm/s75EDV cm/s22Antegrade +---------+--------+--+--------+--+---------+  Left Carotid Findings: +----------+--------+--------+--------+------------+--------+           PSV cm/sEDV cm/sStenosisDescribe    Comments +----------+--------+--------+--------+------------+--------+ CCA Prox  75      12              heterogenous         +----------+--------+--------+--------+------------+--------+ CCA Distal109     24              heterogenous         +----------+--------+--------+--------+------------+--------+ ICA Prox  84      30      1-39%   heterogenous         +----------+--------+--------+--------+------------+--------+  ICA Distal96      35                                   +----------+--------+--------+--------+------------+--------+ ECA       91      9                                    +----------+--------+--------+--------+------------+--------+ +----------+--------+--------+--------+-------------------+ SubclavianPSV cm/sEDV cm/sDescribeArm Pressure (mmHG) +----------+--------+--------+--------+-------------------+           109                                         +----------+--------+--------+--------+-------------------+  +---------+--------+--+--------+--+---------+ VertebralPSV cm/s57EDV cm/s15Antegrade +---------+--------+--+--------+--+---------+  Summary: Right Carotid: Velocities in the right ICA are consistent with a 1-39% stenosis. Left Carotid: Velocities in the left ICA are consistent with a 1-39% stenosis. Vertebrals: Bilateral vertebral arteries demonstrate antegrade flow. *See table(s) above for measurements and observations.  Electronically signed by Antony Contras MD on 03/09/2018 at 8:32:27 AM.    Final    US Abdomen Limited Ruq  Result Date: 03/08/2018 CLINICAL DATA:  Elevated liver function tests. 12.6 cm hypodense lesion in the liver on a recent chest, abdomen and pelvis CT. Left breast cancer diagnosed in October 2019. EXAM: ULTRASOUND ABDOMEN LIMITED RIGHT UPPER QUADRANT COMPARISON:  None. FINDINGS: Gallbladder: No gallstones or wall thickening visualized. No sonographic Murphy sign noted by sonographer. Common bile duct: Diameter: 3.4 mm Liver: 1.6 cm right lobe liver cyst. This has ultrasound features of a simple cyst without internal vascularity with color Doppler. This corresponds to the recently demonstrated liver lesion. The liver is normal in echotexture. No intrahepatic biliary ductal dilatation is seen. Portal vein is patent on color Doppler imaging with normal direction of blood flow towards the liver. IMPRESSION: 1. 1.6 cm right lobe liver cyst. 2. Otherwise, normal examination. Electronically Signed   By: Claudie Revering M.D.   On: 03/08/2018 18:13     I have reviewed the above imaging: MRI brain shows no acute findings.  Patient has some white matter disease.  No Contrast-enhancement to suggest patient has metastatic breast cancer reviewed carotid ultrasound findings there is no significant stenosis   ASSESSMENT AND PLAN  62 year old female presents with what appears to be convulsive syncopal episodes after having breakfast 2 days in a row. Less likely to be seizures as patient is  lightheaded and remembers the room becoming dark before passing out.  MRI brain with and without contrast is negative for any seizure focus and EEG is normal makes the suspicion of seizures further low.     Convulsive syncope, less likely seizures ?postprandial syncope   Recommendations Will not start patient on antiepileptics 30-day cardiac monitor No driving for 6 months in case these were seizures  Outpatient neurology follow-up   Sushanth Aroor [facility] Pager Number [phone]

## 2018-03-10 ENCOUNTER — Telehealth: Payer: Self-pay | Admitting: *Deleted

## 2018-03-10 DIAGNOSIS — R55 Syncope and collapse: Secondary | ICD-10-CM | POA: Diagnosis not present

## 2018-03-10 LAB — TSH: TSH: 3.644 u[IU]/mL (ref 0.350–4.500)

## 2018-03-10 LAB — CORTISOL: Cortisol, Plasma: 18.1 ug/dL

## 2018-03-10 LAB — BASIC METABOLIC PANEL
Anion gap: 9 (ref 5–15)
BUN: 11 mg/dL (ref 6–20)
CO2: 24 mmol/L (ref 22–32)
Calcium: 8 mg/dL — ABNORMAL LOW (ref 8.9–10.3)
Chloride: 103 mmol/L (ref 98–111)
Creatinine, Ser: 0.9 mg/dL (ref 0.44–1.00)
GFR calc Af Amer: >60 mL/min (ref >60)
GFR calc non Af Amer: >60 mL/min (ref >60)
Glucose, Bld: 102 mg/dL — ABNORMAL HIGH (ref 70–99)
Potassium: 3.8 mmol/L (ref 3.5–5.1)
Sodium: 136 mmol/L (ref 135–145)

## 2018-03-10 LAB — HIV ANTIBODY (ROUTINE TESTING W REFLEX): HIV Screen 4th Generation wRfx: NONREACTIVE

## 2018-03-10 MED ORDER — PANTOPRAZOLE SODIUM 20 MG PO TBEC: 20.0000 mg | DELAYED_RELEASE_TABLET | Freq: Every day | ORAL | 0 refills | Status: DC

## 2018-03-10 MED ORDER — HEPARIN SOD (PORK) LOCK FLUSH 100 UNIT/ML IV SOLN
500.0000 [IU] | INTRAVENOUS | Status: AC | PRN
  Administered 2018-03-10: 500 [IU]

## 2018-03-10 MED FILL — PANTOPRAZOLE SOD DR 20 MG T: 20 | 30 days supply | Qty: 30 | Fill #0

## 2018-03-10 NOTE — Telephone Encounter (Signed)
TC for chemotherapy follow not made as patient has been hospitalized since 03/08/18

## 2018-03-10 NOTE — Progress Notes (Signed)
FMLA successfully faxed to [employer] at [phone]. Mailed copy to patient address on file. 

## 2018-03-10 NOTE — Progress Notes (Signed)
Patient given discharge, follow up, and medication instructions, verbalized understanding, IV team RN de-accessed port, telemetry monitor removed, personal belongings with patient, pt's sister to transport home

## 2018-03-10 NOTE — Care Management Note (Signed)
Case Management Note  Patient Details  Name: Christy Serrano MRN: [id number] Date of Birth: 1955-10-12  Subjective/Objective:                    Action/Plan:Pt's was encouraged to call her insurance [employer] for [facility] follow up. Information was faxed to [employer] [phone] and spoke with Myriam Jacobson at [facility] intake at [phone] intake number [phone].   Expected Discharge Date:  03/10/18               Expected Discharge Plan:  Home/Self Care  In-House Referral:     Discharge planning Services  CM Consult  Post Acute Care Choice:    Choice offered to:     DME Arranged:    DME Agency:     HH Arranged:    HH Agency:     Status of Service:  In process, will continue to follow  If discussed at Long Length of Stay Meetings, dates discussed:    Additional CommentsPurcell Mouton, RN 03/10/2018, 12:57 PM

## 2018-03-10 NOTE — Care Management Note (Signed)
Case Management Note  Patient Details  Name: Christy Serrano MRN: [id number] Date of Birth: 1955-09-10  Subjective/Objective:                    Action/Plan: Explained to pt that insurance will not pay for shower seat. Pt may purchase one from [place], [place] or [place].   Expected Discharge Date:  (unknown)               Expected Discharge Plan:  Home/Self Care  In-House Referral:     Discharge planning Services  CM Consult  Post Acute Care Choice:    Choice offered to:     DME Arranged:    DME Agency:     HH Arranged:    HH Agency:     Status of Service:  In process, will continue to follow  If discussed at Long Length of Stay Meetings, dates discussed:    Additional CommentsPurcell Mouton, RN 03/10/2018, 11:15 AM

## 2018-03-10 NOTE — Progress Notes (Signed)
Patient Care Team: Velna Hatchet, MD as PCP - General (Internal Medicine)  DIAGNOSIS:    ICD-10-CM   1. Malignant neoplasm of overlapping sites of left breast in female, estrogen receptor negative ([place]) C50.812    Z17.1     SUMMARY OF ONCOLOGIC HISTORY:   Malignant neoplasm of left female breast ([place])   01/18/2018 Initial Diagnosis    Screening mammogram detected microcalcifications and asymmetry, left breast segmental microcalcifications UOQ 1.5 x 4.3 x 5.4 cm biopsy-proven IDC with DCIS with LV I, grade 3, ER 0%, PR 0%, Ki-67 30%, HER-2 +3+ by IHC; hypoechoic mass 2 o'clock position 1.2 x 1.3 x 1.7 cm biopsy IDC, LV I present, grade 2-3, ER 0%, PR 0%, Ki-67 40%, HER-2 +3+, left axillary lymph node positive (3 nodes noted by [place]) T3 N1 stage IIIA    02/03/2018 Cancer Staging    Staging form: Breast, AJCC 8th Edition - Clinical: Stage IIIA (cT3, cN1, cM0, G3, ER-, PR-, HER2+) - Signed by Nicholas Lose, MD on 02/03/2018    02/20/2018 Genetic Testing    No pathogenic variants identified on the [employer] Expanded + RNA insight panel. Genes Analyzed (67 total): AIP, ALK, APC*, ATM*, BAP1, BARD1, BLM, BMPR1A, BRCA1*, BRCA2*, BRIP1*, CDH1*, CDK4, CDKN1B, CDKN2A, CHEK2*, DICER1, FANCC, FH, FLCN, GALNT12, HOXB13, MAX, MEN1, MET, MLH1*, MRE11A, MSH2*, MSH6*, MUTYH*, NBN, NF1*, NF2, PALB2*, PHOX2B, PMS2*, POLD1, POLE, POT1, PRKAR1A, PTCH1, PTEN*, RAD50, RAD51C*, RAD51D*, RB1, RET, SDHA, SDHAF2, SDHB, SDHC, SDHD, SMAD4, SMARCA4, SMARCB1, SMARCE1, STK11, SUFU, TMEM127, TP53*, TSC1, TSC2, VHL and XRCC2 (sequencing and deletion/duplication); MITF (sequencing only); EPCAM and GREM1 (deletion/duplication only). DNA and RNA analyses performed for * genes. The report date is 02/20/2018.    03/04/2018 -  Neo-Adjuvant Chemotherapy    Neoadjuvant chemotherapy with [facility] Perjeta    03/08/2018 - 03/10/2018 Hospital Admission    Admitted for recurrent syncope with loss of consciousness for up to 5  minutes with loss of bladder, no abnormalities identified.  MRI brain, carotid ultrasound, EEG studies were negative.     CHIEF COMPLIANT: Follow-up of left breast cancer following recent hospitalization  INTERVAL HISTORY: Christy Serrano is a 62 y.o. with above-mentioned history of left breast cancer who is currently receiving neoadjuvant chemotherapy with [place]. Her port was placed on 03/03/18 by Dr. Marlou Starks. She was recently hospitalized after presenting to the ED for syncope and collapsing on 03/07/18. She missed chemotherapy due to this hospitalization. She presents to the clinic today with her sister and is doing okay. Yesterday she was constipated and so she took milk of magnesia. Last night she had diarrhea 2x and took some Imodium which was followed by 2 more episodes of diarrhea. After eating breakfast she had another episode of diarrhea and took more Imodium. She notes she is also burping more than normal. She also notes hip pain but attributes it to not taking Claritin.   REVIEW OF SYSTEMS:   Constitutional: Denies fevers, chills or abnormal weight loss Eyes: Denies blurriness of vision Ears, nose, mouth, throat, and face: Denies mucositis or sore throat Respiratory: Denies cough, dyspnea or wheezes Cardiovascular: Denies palpitation, chest discomfort Gastrointestinal:  Denies nausea, heartburn (+) diarrhea x5 in 12 hrs (+) abnormally frequent burping Skin: Denies abnormal skin rashes MSK: (+) hip pain Lymphatics: Denies new lymphadenopathy or easy bruising Neurological:Denies numbness, tingling or new weaknesses Behavioral/Psych: Mood is stable, no new changes  Extremities: No lower extremity edema Breast: denies any pain or lumps or nodules in either breasts All  other systems were reviewed with the patient and are negative.  I have reviewed the past medical history, past surgical history, social history and family history with the patient and they are unchanged from previous  note.  ALLERGIES:  is allergic to oxycodone-acetaminophen and amoxicillin.  MEDICATIONS:  Current Outpatient Medications  Medication Sig Dispense Refill  . acetaminophen (TYLENOL) 500 MG tablet Take 1,000 mg by mouth every 6 (six) hours as needed for mild pain.    Marland Kitchen albuterol (PROVENTIL HFA;VENTOLIN HFA) 108 (90 Base) MCG/ACT inhaler Inhale into the lungs every 6 (six) hours as needed for wheezing or shortness of breath.    . Calcium-Phosphorus-Vitamin D (CITRACAL +D3 PO) Take 1,200 mcg by mouth.    . cholecalciferol (VITAMIN D) 1000 units tablet Take 1,000 Units by mouth daily.    Marland Kitchen dexamethasone (DECADRON) 4 MG tablet Take 1 tablet (4 mg total) by mouth daily. Take 1 tablet day before chemo and 1 tablet day after with food 12 tablet 0  . diphenoxylate-atropine (LOMOTIL) 2.5-0.025 MG tablet Take 1 tablet by mouth 4 (four) times daily as needed for diarrhea or loose stools. 30 tablet 3  . estradiol (VIVELLE-DOT) 0.075 MG/24HR Place 1 patch onto the skin 2 (two) times a week.     . lidocaine-prilocaine (EMLA) cream Apply to affected area once 30 g 3  . LORazepam (ATIVAN) 0.5 MG tablet Take 1 tablet (0.5 mg total) by mouth at bedtime as needed (Nausea or vomiting). 30 tablet 0  . Multiple Vitamin (MULTIVITAMIN) capsule Take 1 capsule by mouth daily.    . ondansetron (ZOFRAN) 8 MG tablet Take 1 tablet (8 mg total) by mouth 2 (two) times daily as needed (Nausea or vomiting). Begin 4 days after chemotherapy. 30 tablet 1  . pantoprazole (PROTONIX) 40 MG tablet Take 1 tablet (40 mg total) by mouth daily. 30 tablet 3  . potassium chloride SA (K-DUR,KLOR-CON) 20 MEQ tablet Take 1 tablet (20 mEq total) by mouth daily. 30 tablet 1  . prochlorperazine (COMPAZINE) 10 MG tablet Take 1 tablet (10 mg total) by mouth every 6 (six) hours as needed (Nausea or vomiting). 30 tablet 1  . traMADol (ULTRAM) 50 MG tablet Take 1-2 tablets (50-100 mg total) by mouth every 6 (six) hours as needed. 20 tablet 0   No  current facility-administered medications for this visit.     PHYSICAL EXAMINATION: ECOG PERFORMANCE STATUS: 1 - Symptomatic but completely ambulatory  Vitals:   03/11/18 1547  BP: 122/72  Pulse: 76  Resp: 18  Temp: 98.7 F (37.1 C)  SpO2: 100%   Filed Weights   03/11/18 1547  Weight: 184 lb 6.4 oz (83.6 kg)    GENERAL:alert, no distress and comfortable SKIN: skin color, texture, turgor are normal, no rashes or significant lesions EYES: normal, Conjunctiva are pink and non-injected, sclera clear OROPHARYNX:no exudate, no erythema and lips, buccal mucosa, and tongue normal  NECK: supple, thyroid normal size, non-tender, without nodularity LYMPH:  no palpable lymphadenopathy in the cervical, axillary or inguinal LUNGS: clear to auscultation and percussion with normal breathing effort HEART: regular rate & rhythm and no murmurs and no lower extremity edema ABDOMEN:abdomen soft, non-tender and normal bowel sounds MUSCULOSKELETAL:no cyanosis of digits and no clubbing  NEURO: alert & oriented x 3 with fluent speech, no focal motor/sensory deficits EXTREMITIES: No lower extremity edema  LABORATORY DATA:  I have reviewed the data as listed CMP Latest Ref Rng & Units 03/10/2018 03/09/2018 03/08/2018  Glucose 70 - 99 mg/dL 102(H)  111(H) 125(H)  BUN 6 - 20 mg/dL _0 Creatinine 0.44 - 1.00 mg/dL 0.90 0.87 0.85  Sodium 135 - 145 mmol/L 136 133(L) 133(L)  Potassium 3.5 - 5.1 mmol/L 3.8 3.8 3.8  Chloride 98 - 111 mmol/L 103 101 101  CO2 22 - 32 mmol/L _1 Calcium 8.9 - 10.3 mg/dL 8.0(L) 8.4(L) 8.4(L)  Total Protein 6.5 - 8.1 g/dL - 6.9 6.5  Total Bilirubin 0.3 - 1.2 mg/dL - 1.3(H) 1.5(H)  Alkaline Phos 38 - 126 U/L - 58 55  AST 15 - 41 U/L - 26 34  ALT 0 - 44 U/L - 59(H) 77(H)    Lab Results  Component Value Date   WBC 7.0 03/11/2018   HGB 11.4 (L) 03/11/2018   HCT 35.9 (L) 03/11/2018   MCV 94.2 03/11/2018   PLT 116 (L) 03/11/2018   NEUTROABS 3.2 03/11/2018     ASSESSMENT & PLAN:  Malignant neoplasm of left female breast ([place]) 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, grade 3, ER 0%, PR 0%, Ki-67 30%, HER-2 +3+ by IHC; hypoechoic mass 2 o'clock position 1.2 x 1.3 x 1.7 cm biopsy IDC, LV I present, grade 2-3, ER 0%, PR 0%, Ki-67 40%, HER-2 +3+, left axillary lymph node positive (3 nodes noted by [place]) T3 N1 stage IIIA  Treatment plan 1.  Neoadjuvant chemotherapy with [place] followed by Herceptin Perjeta maintenance versus [place]cy[place] maintenance 2. possible mastectomy with targeted node dissection 3.  Adjuvant radiation therapy --------------------------------------------------------------------------------------------------------------------------------------------------- Current treatment: Cycle 1 day 8 [facility] Perjeta Chemo toxicities: 1.  Recurrent syncope requiring hospitalization: Unclear etiology uncertain if it is related to chemotherapy. 2.  Based on the above symptom I will reduce the dosage of cycle 2 of chemotherapy. 3.  Diarrhea: Recent diarrhea may be related to patient taking milk of magnesia.  However I prescribed her Lomotil in case Imodium does not stop her diarrhea.  We may discontinue Perjeta with the next cycle 4.  Hypokalemia: I sent a refill on potassium replacement 20 mEq daily 5.  Excessive burping/acid reflux: Increase the dosage of Protonix to 40 mg daily  Return to clinic in 2 weeks for cycle 2.  No orders of the defined types were placed in this encounter.  The patient has a good understanding of the overall plan. she agrees with it. she will call with any problems that may develop before the next visit here.  Nicholas Lose, MD 03/11/2018   I, Cloyde Reams Dorshimer, am acting as scribe for Nicholas Lose, MD.  I have reviewed the above documentation for accuracy and completeness, and I  agree with the above.

## 2018-03-10 NOTE — Discharge Summary (Signed)
Physician Discharge Summary  MIYAKO OELKE VQQ:[id number] DOB: Jul 20, 1955 DOA: 03/08/2018  PCP: Velna Hatchet, MD  Admit date: 03/08/2018 Discharge date: 03/10/2018  Admitted From: Home  Disposition: Home   Recommendations for Outpatient Follow-up:  1. Follow up with PCP in 1-2 weeks 2. Please obtain BMP/CBC in one week 3. Needs Holter monitor    Discharge Condition: stable  CODE STATUS: full code.  Diet recommendation: Heart Healthy   Brief/Interim Summary: 62 year old with recent diagnosis of a stage III breast cancer underwent first treatment on December 12.  Who presented after a syncope episode x2, episode complaining with jerking movement. Patient was evaluated by neurology who did not recommend start patient on antiseizure medication at this time. Patient will need 30 days Holter monitor. Patient is stable for discharge today.  Assessment/Plan: Active Problems:   Recurrent syncope  Recurrent syncope with jerky movement/LOC/drooling/urinar incontinence  -she reports no prior seizure -seizure precaution -she has no hypoxia, no chest pain, less likely PE, will hold on PE work up -She just had an echocardiogram on 12/10 prior to breast cancer treatment which is unremarkable -CT head/mri brain/carotic us/EEG unremarkable, she does not have orthostatic hypotension, ua unremarkable, blood culture no growth, EKG/Tele unremarkable -neurology consulted, think seizure less likely   Per neurology "Recommendations Will not start patient on antiepileptics 30-day cardiac monitor No driving for 6 months in case these were seizures" . Outpatient neurology follow-upfamily is advised by neurology to record event if happen again suspect vaso-vagal.   -Outpatient event monitor ( message sent to cardiology through epic) Cortisol level normal.   Hyponatremia: Mild , likely from dehydration, continue hydration for another 24hrs.  resolved.   Thrombocytopenia -possible  from chemo, no sign of bleeding , monitor  lft elevated  ab US unremarkable Denies ab pain,  elevated lft likely from chemo  needs LFT outpatient.   T3 N1 stage IIIA(ER-, PR-, HER2+), left breast -follow with Dr Lindi Adie -s/pINSERTION PORT-A-CATH (Right)on 12/11, received herceptin, perjecta and taxotere/carbo on 12/12 -received nulasta on 12/14 leukopenia; has appointment with oncologist 12-19   Discharge Diagnoses:  Active Problems:   Malignant neoplasm of left female breast ([place])   Recurrent syncope   Seizure-like activity ([place])   Hyponatremia    Discharge Instructions  Discharge Instructions    Diet - low sodium heart healthy   Complete by:  As directed    Discharge instructions   Complete by:  As directed    Please do not drive, until cleared by pcp/neurology   Increase activity slowly   Complete by:  As directed      Allergies as of 03/10/2018      Reactions   Oxycodone-acetaminophen Other (See Comments), Palpitations   tachycardia   Amoxicillin Rash   DID THE REACTION INVOLVE: Swelling of the face/tongue/throat, SOB, or low BP? No Sudden or severe rash/hives, skin peeling, or the inside of the mouth or nose? No Did it require medical treatment? No When did it last happen?in her 60's If all above answers are "NO", may proceed with cephalosporin use.      Medication List    TAKE these medications   acetaminophen 500 MG tablet Commonly known as:  TYLENOL Take 1,000 mg by mouth every 6 (six) hours as needed for mild pain.   albuterol 108 (90 Base) MCG/ACT inhaler Commonly known as:  PROVENTIL HFA;VENTOLIN HFA Inhale into the lungs every 6 (six) hours as needed for wheezing or shortness of breath.   cholecalciferol 1000 units tablet Commonly known  as:  VITAMIN D Take 1,000 Units by mouth daily.   CITRACAL +D3 PO Take 1,200 mcg by mouth.   dexamethasone 4 MG tablet Commonly known as:  DECADRON Take 1 tablet (4 mg total) by mouth daily.  Take 1 tablet day before chemo and 1 tablet day after with food   estradiol 0.075 MG/24HR Commonly known as:  VIVELLE-DOT Place 1 patch onto the skin 2 (two) times a week.   lidocaine-prilocaine cream Commonly known as:  EMLA Apply to affected area once   LORazepam 0.5 MG tablet Commonly known as:  ATIVAN Take 1 tablet (0.5 mg total) by mouth at bedtime as needed (Nausea or vomiting).   multivitamin capsule Take 1 capsule by mouth daily.   ondansetron 8 MG tablet Commonly known as:  ZOFRAN Take 1 tablet (8 mg total) by mouth 2 (two) times daily as needed (Nausea or vomiting). Begin 4 days after chemotherapy.   pantoprazole 20 MG tablet Commonly known as:  PROTONIX Take 1 tablet (20 mg total) by mouth daily. Start taking on:  March 11, 2018   potassium chloride SA 20 MEQ tablet Commonly known as:  K-DUR,KLOR-CON Take 1 tablet (20 mEq total) by mouth daily.   prochlorperazine 10 MG tablet Commonly known as:  COMPAZINE Take 1 tablet (10 mg total) by mouth every 6 (six) hours as needed (Nausea or vomiting).   traMADol 50 MG tablet Commonly known as:  ULTRAM Take 1-2 tablets (50-100 mg total) by mouth every 6 (six) hours as needed.            Durable Medical Equipment  (From admission, onward)         Start     Ordered   03/10/18 1103  For home use only DME Shower stool  Once     03/10/18 1102         Follow-up Information    Crenshaw, Denice Bors, MD Follow up.   Specialty:  Cardiology Contact information: [address] [place] [address] [phone]        [facility] Follow up.   Specialty:  Cardiology Why:  please contact [place] office for heart monitor if you do not hear from them in three business days. Contact information: [address] [facility] Follow up.   Contact information: [address]     [address] [place] [address] [phone]       Nicholas Lose, MD Follow up.   Specialty:  Hematology and Oncology Contact information: [place] [address] [phone]        Velna Hatchet, MD Follow up in 1 week(s).   Specialty:  Internal Medicine Why:  hospital discharge follow up Contact information: [address]  [place] [address] [phone]          Allergies  Allergen Reactions  . Oxycodone-Acetaminophen Other (See Comments) and Palpitations    tachycardia   . Amoxicillin Rash    DID THE REACTION INVOLVE: Swelling of the face/tongue/throat, SOB, or low BP? No Sudden or severe rash/hives, skin peeling, or the inside of the mouth or nose? No Did it require medical treatment? No When did it last happen?in her 25's If all above answers are "NO", may proceed with cephalosporin use.     Consultations:  Neurology   Procedures/Studies: Ct Head Wo Contrast  Result Date: 03/08/2018 CLINICAL DATA:  Questionable  seizure versus syncope. Breast carcinoma EXAM: CT HEAD WITHOUT CONTRAST TECHNIQUE: Contiguous axial images were obtained from the base of the skull through the vertex without intravenous contrast. COMPARISON:  None. FINDINGS: Brain: The ventricles are normal in size and configuration. There is no intracranial mass, hemorrhage, extra-axial fluid collection, or midline shift. There is mild patchy decreased attenuation in the centra semiovale, likely small vessel disease. No acute infarct is evident. Brain parenchyma otherwise appears unremarkable. Vascular: No evident vascular calcification.  No hyperdense vessel. Skull: Bony calvarium appears intact. Sinuses/Orbits: There is mucosal thickening in several ethmoid air cells. Other visualized paranasal sinuses are clear. Visualized orbits appear symmetric bilaterally. Other: Mastoid air cells are clear. There is an apparent sebaceous cyst in the right  parietal region measuring 1.0 x 0.8 cm. IMPRESSION: Mild presumed periventricular small vessel disease. No mass or hemorrhage evident. No appreciable edema. Mucosal thickening noted in several ethmoid air cells. Electronically Signed   By: Lowella Grip III M.D.   On: 03/08/2018 13:46   Ct Chest W Contrast  Result Date: 03/02/2018 CLINICAL DATA:  Left breast cancer diagnosed 12/2017, chemotherapy and XRT to start EXAM: CT CHEST, ABDOMEN, AND PELVIS WITH CONTRAST TECHNIQUE: Multidetector CT imaging of the chest, abdomen and pelvis was performed following the standard protocol during bolus administration of intravenous contrast. CONTRAST:  168m OMNIPAQUE IOHEXOL 300 MG/ML  SOLN COMPARISON:  None. FINDINGS: CT CHEST FINDINGS Cardiovascular: Heart is normal in size.  No pericardial effusion. No evidence of thoracic aortic aneurysm. Mediastinum/Nodes: Small mediastinal lymph nodes, including a 7 mm short axis right paratracheal node with preservation of the normal fatty hilum and a 8 mm subcarinal node (series 2/image 28), nonspecific and possibly reactive. Suspicious left axillary and subpectoral nodes, including two left subpectoral nodes measuring 7 and 10 mm (series 2/images 16 and 23) and clustered left axillary nodes measuring up to 12 mm short axis (series 2/image 27). Visualized thyroid is unremarkable. Lungs/Pleura: No suspicious pulmonary nodules. No focal consolidation. No pleural effusion or pneumothorax. Musculoskeletal: 1.4 cm lateral left breast mass (series 2/image 28), likely corresponding to the patient's known primary breast neoplasm. Visualized osseous structures are within normal limits. CT ABDOMEN PELVIS FINDINGS Hepatobiliary: 1.6 cm hypodense lesion in segment 7 of the liver (series 2/image 58), measuring just higher than simple fluid density, technically indeterminate but favoring a complex cyst versus hemangioma. Gallbladder is unremarkable. No intrahepatic or extrahepatic duct  dilatation. Pancreas: Within normal limits. Spleen: Within normal limits. Adrenals/Urinary Tract: Adrenal glands are within normal limits. Kidneys are within normal limits.  No hydronephrosis. Bladder is thick-walled although underdistended. Stomach/Bowel: Stomach is notable for a tiny hiatal hernia. No evidence of bowel obstruction. Normal appendix (series 2/image 95). Vascular/Lymphatic: No evidence of abdominal aortic aneurysm. No suspicious abdominopelvic lymphadenopathy. Reproductive: Status post hysterectomy. Other: No abdominopelvic ascites. Musculoskeletal: Visualized osseous structures are within normal limits. IMPRESSION: 1.4 cm lateral left breast mass, likely corresponding to the patient's known primary breast neoplasm. Suspected left axillary and subpectoral nodal metastases. Small mediastinal lymph nodes, nonspecific and favored to be reactive. 1.6 cm hypodense liver lesion in segment 7, technically indeterminate although favoring a benign cyst complex cyst or hemangioma. Consider attention on follow-up as clinically warranted. Electronically Signed   By: SJulian HyM.D.   On: 03/02/2018 14:35   Mr BJeri CosAnd Wo Contrast  Result Date: 03/08/2018 CLINICAL DATA:  Breast cancer.  Recent syncopal episodes. EXAM: MRI HEAD WITHOUT AND WITH CONTRAST TECHNIQUE: Multiplanar, multiecho pulse sequences of the brain and  surrounding structures were obtained without and with intravenous contrast. CONTRAST:  8 mL Gadavist COMPARISON:  Head CT 03/08/2018 FINDINGS: BRAIN: There is no acute infarct, acute hemorrhage, hydrocephalus or extra-axial collection. The midline structures are normal. No midline shift or other mass effect. There are no old infarcts. Multifocal white matter hyperintensity, most commonly due to chronic ischemic microangiopathy. The cerebral and cerebellar volume are age-appropriate. Susceptibility-sensitive sequences show no chronic microhemorrhage or superficial siderosis. No abnormal  contrast enhancement. VASCULAR: Major intracranial arterial and venous sinus flow voids are normal. SKULL AND UPPER CERVICAL SPINE: Calvarial bone marrow signal is normal. There is no skull base mass. Visualized upper cervical spine and soft tissues are normal. SINUSES/ORBITS: No fluid levels or advanced mucosal thickening. No mastoid or middle ear effusion. The orbits are normal. IMPRESSION: 1. No intracranial metastatic disease. 2. Numerous scattered white matter lesions, most likely indicating chronic small vessel ischemia. Electronically Signed   By: Ulyses Jarred M.D.   On: 03/08/2018 19:40   Nm Bone Scan Whole Body  Result Date: 03/02/2018 CLINICAL DATA:  LEFT breast cancer EXAM: NUCLEAR MEDICINE WHOLE BODY BONE SCAN TECHNIQUE: Whole body anterior and posterior images were obtained approximately 3 hours after intravenous injection of radiopharmaceutical. RADIOPHARMACEUTICALS:  20.5 mCi Technetium-78mMDP IV COMPARISON:  None Correlation: CT chest abdomen pelvis 03/02/2018 FINDINGS: Scattered degenerative type uptake at shoulders, sternoclavicular joints, elbows, hips, and knees. Uptake at the manubriosternal junction, which can be physiologic or degenerative. No definite sites of abnormal osseous tracer accumulation are identified to suggest osseous metastatic disease. Minimal nonspecific localization of tracer at the breasts bilaterally. Otherwise expected urinary tract and soft tissue distribution of tracer. IMPRESSION: No definite scintigraphic evidence of osseous metastatic disease. Electronically Signed   By: MLavonia DanaM.D.   On: 03/02/2018 16:34   Ct Abdomen Pelvis W Contrast  Result Date: 03/02/2018 CLINICAL DATA:  Left breast cancer diagnosed 12/2017, chemotherapy and XRT to start EXAM: CT CHEST, ABDOMEN, AND PELVIS WITH CONTRAST TECHNIQUE: Multidetector CT imaging of the chest, abdomen and pelvis was performed following the standard protocol during bolus administration of intravenous  contrast. CONTRAST:  1068mOMNIPAQUE IOHEXOL 300 MG/ML  SOLN COMPARISON:  None. FINDINGS: CT CHEST FINDINGS Cardiovascular: Heart is normal in size.  No pericardial effusion. No evidence of thoracic aortic aneurysm. Mediastinum/Nodes: Small mediastinal lymph nodes, including a 7 mm short axis right paratracheal node with preservation of the normal fatty hilum and a 8 mm subcarinal node (series 2/image 28), nonspecific and possibly reactive. Suspicious left axillary and subpectoral nodes, including two left subpectoral nodes measuring 7 and 10 mm (series 2/images 16 and 23) and clustered left axillary nodes measuring up to 12 mm short axis (series 2/image 27). Visualized thyroid is unremarkable. Lungs/Pleura: No suspicious pulmonary nodules. No focal consolidation. No pleural effusion or pneumothorax. Musculoskeletal: 1.4 cm lateral left breast mass (series 2/image 28), likely corresponding to the patient's known primary breast neoplasm. Visualized osseous structures are within normal limits. CT ABDOMEN PELVIS FINDINGS Hepatobiliary: 1.6 cm hypodense lesion in segment 7 of the liver (series 2/image 58), measuring just higher than simple fluid density, technically indeterminate but favoring a complex cyst versus hemangioma. Gallbladder is unremarkable. No intrahepatic or extrahepatic duct dilatation. Pancreas: Within normal limits. Spleen: Within normal limits. Adrenals/Urinary Tract: Adrenal glands are within normal limits. Kidneys are within normal limits.  No hydronephrosis. Bladder is thick-walled although underdistended. Stomach/Bowel: Stomach is notable for a tiny hiatal hernia. No evidence of bowel obstruction. Normal appendix (series 2/image 95). Vascular/Lymphatic: No  evidence of abdominal aortic aneurysm. No suspicious abdominopelvic lymphadenopathy. Reproductive: Status post hysterectomy. Other: No abdominopelvic ascites. Musculoskeletal: Visualized osseous structures are within normal limits. IMPRESSION:  1.4 cm lateral left breast mass, likely corresponding to the patient's known primary breast neoplasm. Suspected left axillary and subpectoral nodal metastases. Small mediastinal lymph nodes, nonspecific and favored to be reactive. 1.6 cm hypodense liver lesion in segment 7, technically indeterminate although favoring a benign cyst complex cyst or hemangioma. Consider attention on follow-up as clinically warranted. Electronically Signed   By: Julian Hy M.D.   On: 03/02/2018 14:35   Dg Chest Port 1 View  Result Date: 03/03/2018 CLINICAL DATA:  Status post Port-A-Cath placement. EXAM: PORTABLE CHEST 1 VIEW COMPARISON:  CT scan of March 02, 2018. FINDINGS: The heart size and mediastinal contours are within normal limits. Right subclavian Port-A-Cath is noted with distal tip in expected position of SVC. No pneumothorax or significant pleural effusion is noted. Right lung is clear. Mild left basilar atelectasis or scarring is noted. The visualized skeletal structures are unremarkable. IMPRESSION: Status post placement of right subclavian Port-A-Cath with distal tip in expected position of the SVC. No pneumothorax is noted. Electronically Signed   By: Marijo Conception, M.D.   On: 03/03/2018 10:17   Dg Fluoro Guide Cv Line-no Report  Result Date: 03/03/2018 Fluoroscopy was utilized by the requesting physician.  No radiographic interpretation.   Mm Clip Placement Left  Result Date: 03/01/2018 CLINICAL DATA:  Status post MR guided core biopsies of the left breast. Patient has had multiple biopsies of the left breast. 1. Biopsy proven invasive ductal carcinoma, ductal carcinoma in situ and lymphovascular invasion (coil shaped clip). 2.   Biopsy proven fibroadenoma (ribbon shaped clip). 3. Biopsy proven invasive ductal carcinoma, ductal carcinoma in situ and lymphovascular invasion (X shaped clip) in the left breast. 4.   Biopsy proven metastatic left axillary lymph node. DIAGNOSTIC LEFT MAMMOGRAM POST  STEREOTACTIC BIOPSIES COMPARISON:  Previous exam(s). FINDINGS: Mammographic images were obtained following MR guided biopsies of the left breast. Mammographic images show there is a cylindrical shaped clip in the upper outer quadrant (anterior) aspect of the left breast. There is a barbell shaped clip in the upper-outer quadrant of the left breast (posterior) in the upper-outer quadrant of the left breast. IMPRESSION: Status post MR guided core biopsies of the left breast with pathology pending. Final Assessment: Post Procedure Mammograms for Marker Placement Electronically Signed   By: Lillia Mountain M.D.   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 This was found to be concordant by Dr. Lillia Mountain. Pathology revealed BENIGN BREAST PARENCHYMA WITH NO SPECIFIC HISTOPATHOLOGIC CHANGES of the Left breast, upper outer quadrant, posterior. This was found to be discordant by Dr. Hassan Rowan, with excision recommended. Pathology results were discussed with the patient by telephone. The patient reported doing well after the biopsies with tenderness at the sites. Post biopsy instructions and care were reviewed and questions were answered. The patient was encouraged to call [facility] for any additional concerns. The patient has a recent diagnosis of left breast cancer and should follow her outlined treatment plan. Dr. Autumn Messing and Dr. Nicholas Lose were notified of biopsy results via EPIC message on March 02, 2018. Pathology  results reported by Terie Purser, RN on 03/03/2018. Electronically Signed   By: Lillia Mountain M.D.   On: 03/03/2018 12:53   Result Date: 03/03/2018 CLINICAL DATA:  Biopsy proven invasive ductal carcinoma, ductal carcinoma in situ and lymphovascular  invasion (coil shaped clip) in the left breast. Biopsy proven fibroadenoma (ribbon shaped clip) in the left breast. Biopsy proven invasive ductal carcinoma, ductal carcinoma in situ and lymphovascular invasion (X shaped clip) in the left breast. Biopsy proven metastatic left axillary lymph node. MR guided core biopsies of 2 additional masses in the left breast recommended. EXAM: MRI GUIDED CORE NEEDLE BIOPSIES OF THE LEFT BREAST TECHNIQUE: Multiplanar, multisequence MR imaging of the left breast was performed both before and after administration of intravenous contrast. CONTRAST:  8 cc of MultiHance COMPARISON:  Previous exams. FINDINGS: I met with the patient, and we discussed the procedure of MRI guided biopsy, including risks, benefits, and alternatives. Specifically, we discussed the risks of infection, bleeding, tissue injury, clip migration, and inadequate sampling. Informed, written consent was given. The usual time out protocol was performed immediately prior to the procedure. Using sterile technique, 1% Lidocaine, MRI guidance, and a 9 gauge vacuum assisted device, biopsy was performed of a mass in the upper-outer quadrant of the left breast (anterior) using a lateral to medial approach. At the conclusion of the procedure, a cylindrical shaped tissue marker clip was deployed into the biopsy cavity. Follow-up 2-view mammogram was performed and dictated separately. I met with the patient, and we discussed the procedure of MRI guided biopsy, including risks, benefits, and alternatives. Specifically, we discussed the risks of infection, bleeding, tissue injury, clip migration, and inadequate sampling. Informed, written consent was given. The usual time out protocol was performed immediately prior to the procedure. Using sterile technique, 1% Lidocaine, MRI guidance, and a 9 gauge vacuum assisted device, biopsy was performed of a mass in the upper-outer quadrant (posterior) using a lateral to medial approach. At  the conclusion of the procedure, a barbell shaped tissue marker clip was deployed into the biopsy cavity. Follow-up 2-view mammogram was performed and dictated separately. IMPRESSION: MRI guided biopsies of the left breast.  No apparent complications. Electronically Signed: By: Lillia Mountain M.D. On: 03/01/2018 10:57   Mr Aundra Millet Breast Bx W Loc Dev Ea Add Lesion Image Bx Spec Mr Guide  Addendum Date: 03/03/2018   ADDENDUM REPORT: 03/03/2018 12:53 ADDENDUM: Pathology revealed HIGH NUCLEAR GRADE DUCTAL CARCINOMA IN SITU of the Left breast, upper outer quadrant, anterior. This was found to be concordant by Dr. Lillia Mountain. Pathology revealed BENIGN BREAST PARENCHYMA WITH NO SPECIFIC HISTOPATHOLOGIC CHANGES of the Left breast, upper outer quadrant, posterior. This was found to be discordant by Dr. Hassan Rowan, with excision recommended. Pathology results were discussed with the patient by telephone. The patient reported doing well after the biopsies with tenderness at the sites. Post biopsy instructions and care were reviewed and questions were answered. The patient was encouraged to call [facility] for any additional concerns. The patient has a recent diagnosis of left breast cancer and should follow her outlined treatment plan. Dr. Autumn Messing and Dr. Nicholas Lose were notified of biopsy results via EPIC message on March 02, 2018. Pathology results reported by Terie Purser, RN on 03/03/2018. Electronically Signed   By: Lillia Mountain M.D.   On: 03/03/2018 12:53   Result Date: 03/03/2018 CLINICAL DATA:  Biopsy proven invasive ductal carcinoma, ductal carcinoma in situ and lymphovascular invasion (coil shaped clip) in  the left breast. Biopsy proven fibroadenoma (ribbon shaped clip) in the left breast. Biopsy proven invasive ductal carcinoma, ductal carcinoma in situ and lymphovascular invasion (X shaped clip) in the left breast. Biopsy proven metastatic left axillary lymph node. MR guided core  biopsies of 2 additional masses in the left breast recommended. EXAM: MRI GUIDED CORE NEEDLE BIOPSIES OF THE LEFT BREAST TECHNIQUE: Multiplanar, multisequence MR imaging of the left breast was performed both before and after administration of intravenous contrast. CONTRAST:  8 cc of MultiHance COMPARISON:  Previous exams. FINDINGS: I met with the patient, and we discussed the procedure of MRI guided biopsy, including risks, benefits, and alternatives. Specifically, we discussed the risks of infection, bleeding, tissue injury, clip migration, and inadequate sampling. Informed, written consent was given. The usual time out protocol was performed immediately prior to the procedure. Using sterile technique, 1% Lidocaine, MRI guidance, and a 9 gauge vacuum assisted device, biopsy was performed of a mass in the upper-outer quadrant of the left breast (anterior) using a lateral to medial approach. At the conclusion of the procedure, a cylindrical shaped tissue marker clip was deployed into the biopsy cavity. Follow-up 2-view mammogram was performed and dictated separately. I met with the patient, and we discussed the procedure of MRI guided biopsy, including risks, benefits, and alternatives. Specifically, we discussed the risks of infection, bleeding, tissue injury, clip migration, and inadequate sampling. Informed, written consent was given. The usual time out protocol was performed immediately prior to the procedure. Using sterile technique, 1% Lidocaine, MRI guidance, and a 9 gauge vacuum assisted device, biopsy was performed of a mass in the upper-outer quadrant (posterior) using a lateral to medial approach. At the conclusion of the procedure, a barbell shaped tissue marker clip was deployed into the biopsy cavity. Follow-up 2-view mammogram was performed and dictated separately. IMPRESSION: MRI guided biopsies of the left breast.  No apparent complications. Electronically Signed: By: Lillia Mountain M.D. On: 03/01/2018  10:57   Vas US Carotid  Result Date: 03/09/2018 Carotid Arterial Duplex Study Indications: Syncope. Performing Technologist: Abram Sander RVS  Examination Guidelines: A complete evaluation includes B-mode imaging, spectral Doppler, color Doppler, and power Doppler as needed of all accessible portions of each vessel. Bilateral testing is considered an integral part of a complete examination. Limited examinations for reoccurring indications may be performed as noted.  Right Carotid Findings: +----------+--------+--------+--------+------------+--------+           PSV cm/sEDV cm/sStenosisDescribe    Comments +----------+--------+--------+--------+------------+--------+ CCA Prox  82      14              heterogenous         +----------+--------+--------+--------+------------+--------+ CCA Distal90      23              heterogenous         +----------+--------+--------+--------+------------+--------+ ICA Prox  52      14      1-39%   heterogenous         +----------+--------+--------+--------+------------+--------+ ICA Distal58      24                                   +----------+--------+--------+--------+------------+--------+ ECA       76      9                                    +----------+--------+--------+--------+------------+--------+ +----------+--------+-------+------------------------------+-------------------+  PSV cm/sEDV cmsDescribe                      Arm Pressure (mmHG) +----------+--------+-------+------------------------------+-------------------+ Subclavian               not visualized due to port                                                 placement                                         +----------+--------+-------+------------------------------+-------------------+ +---------+--------+--+--------+--+---------+ VertebralPSV cm/s75EDV cm/s22Antegrade +---------+--------+--+--------+--+---------+  Left Carotid  Findings: +----------+--------+--------+--------+------------+--------+           PSV cm/sEDV cm/sStenosisDescribe    Comments +----------+--------+--------+--------+------------+--------+ CCA Prox  75      12              heterogenous         +----------+--------+--------+--------+------------+--------+ CCA Distal109     24              heterogenous         +----------+--------+--------+--------+------------+--------+ ICA Prox  84      30      1-39%   heterogenous         +----------+--------+--------+--------+------------+--------+ ICA Distal96      35                                   +----------+--------+--------+--------+------------+--------+ ECA       91      9                                    +----------+--------+--------+--------+------------+--------+ +----------+--------+--------+--------+-------------------+ SubclavianPSV cm/sEDV cm/sDescribeArm Pressure (mmHG) +----------+--------+--------+--------+-------------------+           109                                         +----------+--------+--------+--------+-------------------+ +---------+--------+--+--------+--+---------+ VertebralPSV cm/s57EDV cm/s15Antegrade +---------+--------+--+--------+--+---------+  Summary: Right Carotid: Velocities in the right ICA are consistent with a 1-39% stenosis. Left Carotid: Velocities in the left ICA are consistent with a 1-39% stenosis. Vertebrals: Bilateral vertebral arteries demonstrate antegrade flow. *See table(s) above for measurements and observations.  Electronically signed by Antony Contras MD on 03/09/2018 at 8:32:27 AM.    Final    US Abdomen Limited Ruq  Result Date: 03/08/2018 CLINICAL DATA:  Elevated liver function tests. 12.6 cm hypodense lesion in the liver on a recent chest, abdomen and pelvis CT. Left breast cancer diagnosed in October 2019. EXAM: ULTRASOUND ABDOMEN LIMITED RIGHT UPPER QUADRANT COMPARISON:  None. FINDINGS:  Gallbladder: No gallstones or wall thickening visualized. No sonographic Murphy sign noted by sonographer. Common bile duct: Diameter: 3.4 mm Liver: 1.6 cm right lobe liver cyst. This has ultrasound features of a simple cyst without internal vascularity with color Doppler. This corresponds to the recently demonstrated liver lesion. The liver is normal in echotexture. No intrahepatic biliary ductal dilatation is seen. Portal vein is patent on color Doppler imaging with normal direction of blood  flow towards the liver. IMPRESSION: 1. 1.6 cm right lobe liver cyst. 2. Otherwise, normal examination. Electronically Signed   By: Claudie Revering M.D.   On: 03/08/2018 18:13      Subjective: Alert no further  shaking movement.  No further  syncope episode.  Discharge Exam: Vitals:   03/09/18 2151 03/10/18 0447  BP: 133/70 122/73  Pulse: 69 64  Resp: 12 16  Temp: 98.8 F (37.1 C) 97.8 F (36.6 C)  SpO2: 96% 98%   Vitals:   03/09/18 0613 03/09/18 1324 03/09/18 2151 03/10/18 0447  BP: 127/72 138/75 133/70 122/73  Pulse: 67 72 69 64  Resp: 17 16 12 16   Temp: 98.6 F (37 C) 98.7 F (37.1 C) 98.8 F (37.1 C) 97.8 F (36.6 C)  TempSrc: Oral Oral Oral Oral  SpO2: 98% 96% 96% 98%  Weight:      Height:        General: Pt is alert, awake, not in acute distress Cardiovascular: RRR, S1/S2 +, no rubs, no gallops Respiratory: CTA bilaterally, no wheezing, no rhonchi Abdominal: Soft, NT, ND, bowel sounds + Extremities: no edema, no cyanosis    The results of significant diagnostics from this hospitalization (including imaging, microbiology, ancillary and laboratory) are listed below for reference.     Microbiology: Recent Results (from the past 240 hour(s))  Culture, blood (routine x 2)     Status: None (Preliminary result)   Collection Time: 03/08/18  5:24 PM  Result Value Ref Range Status   Specimen Description   Final    BLOOD RIGHT HAND Performed at [facility]  [address] [place], [place] [address]    Special Requests   Final    BOTTLES DRAWN AEROBIC AND ANAEROBIC Blood Culture adequate volume Performed at [facility] [address], [place], [place] [address]    Culture   Final    NO GROWTH 2 DAYS Performed at [facility] [address] [place], [place] [address]    Report Status PENDING  Incomplete  Culture, blood (routine x 2)     Status: None (Preliminary result)   Collection Time: 03/08/18  5:28 PM  Result Value Ref Range Status   Specimen Description   Final    BLOOD RIGHT HAND Performed at [facility] [address] [place], [place] [address]    Special Requests   Final    BOTTLES DRAWN AEROBIC AND ANAEROBIC Blood Culture adequate volume Performed at [facility] [address], [place], [place] [address]    Culture   Final    NO GROWTH 2 DAYS Performed at [facility] [address] [place], [place] [address]    Report Status PENDING  Incomplete     Labs: BNP (last 3 results) No results for input(s): BNP in the last 8760 hours. Basic Metabolic Panel: Recent Labs  Lab 03/04/18 0815 03/07/18 1147 03/08/18 1221 03/09/18 0929 03/10/18 0538  NA 139 136 133* 133* 136  K 3.9 3.1* 3.8 3.8 3.8  CL 104 102 101 101 103  CO2 26 27 26 24 24   GLUCOSE 133* 122* 125* 111* 102*  BUN 19 23* 17 14 11   CREATININE 1.06* 1.07* 0.85 0.87 0.90  CALCIUM 9.7 8.6* 8.4* 8.4* 8.0*  MG  --   --  2.3 2.3  --    Liver Function Tests: Recent Labs  Lab 03/04/18 0815 03/07/18 1147 03/08/18 1221 03/09/18 0929  AST 28  55* 34 26  ALT 27 103* 77* 59*  ALKPHOS 66 53 55 58  BILITOT 0.5 1.1 1.5* 1.3*  PROT 7.4 6.6 6.5 6.9  ALBUMIN 3.8 3.7 3.8 3.8   No results for input(s): LIPASE, AMYLASE in the last 168 hours. No results for input(s): AMMONIA in the last 168 hours. CBC: Recent Labs  Lab 03/04/18 0815 03/07/18 1147 03/08/18 1221 03/09/18 0929  WBC 10.7*  13.0* 5.7 2.7*  NEUTROABS 9.4* 10.8* 4.5  --   HGB 10.9* 11.1* 11.3* 12.0  HCT 33.8* 35.6* 36.1 38.0  MCV 92.3 93.9 95.8 95.7  PLT 172 103* 89* 98*   Cardiac Enzymes: No results for input(s): CKTOTAL, CKMB, CKMBINDEX, TROPONINI in the last 168 hours. BNP: Invalid input(s): POCBNP CBG: No results for input(s): GLUCAP in the last 168 hours. D-Dimer No results for input(s): DDIMER in the last 72 hours. Hgb A1c No results for input(s): HGBA1C in the last 72 hours. Lipid Profile No results for input(s): CHOL, HDL, LDLCALC, TRIG, CHOLHDL, LDLDIRECT in the last 72 hours. Thyroid function studies Recent Labs    03/10/18 0538  TSH 3.644   Anemia work up No results for input(s): VITAMINB12, FOLATE, FERRITIN, TIBC, IRON, RETICCTPCT in the last 72 hours. Urinalysis    Component Value Date/Time   COLORURINE STRAW (A) 03/08/2018 1221   APPEARANCEUR CLEAR 03/08/2018 1221   LABSPEC 1.004 (L) 03/08/2018 1221   PHURINE 8.0 03/08/2018 1221   GLUCOSEU NEGATIVE 03/08/2018 1221   HGBUR NEGATIVE 03/08/2018 [place] 03/08/2018 1221   KETONESUR NEGATIVE 03/08/2018 1221   PROTEINUR NEGATIVE 03/08/2018 1221   NITRITE NEGATIVE 03/08/2018 1221   LEUKOCYTESUR NEGATIVE 03/08/2018 1221   Sepsis Labs Invalid input(s): PROCALCITONIN,  WBC,  LACTICIDVEN Microbiology Recent Results (from the past 240 hour(s))  Culture, blood (routine x 2)     Status: None (Preliminary result)   Collection Time: 03/08/18  5:24 PM  Result Value Ref Range Status   Specimen Description   Final    BLOOD RIGHT HAND Performed at [facility], [place] [address] [place] [address], [place], [place] [address]    Special Requests   Final    BOTTLES DRAWN AEROBIC AND ANAEROBIC Blood Culture adequate volume Performed at [facility] [address] [place], [place] [address]    Culture   Final    NO GROWTH 2 DAYS Performed at [facility] [address], [place], [place]  [address]    Report Status PENDING  Incomplete  Culture, blood (routine x 2)     Status: None (Preliminary result)   Collection Time: 03/08/18  5:28 PM  Result Value Ref Range Status   Specimen Description   Final    BLOOD RIGHT HAND Performed at [facility] [address], [place], [place] [address]    Special Requests   Final    BOTTLES DRAWN AEROBIC AND ANAEROBIC Blood Culture adequate volume Performed at [facility] [address], [place], [place] [address]    Culture   Final    NO GROWTH 2 DAYS Performed at [facility] [address], [place], [place] [address]    Report Status PENDING  Incomplete     Time coordinating discharge: 35 minutes  SIGNED:   Elmarie Shiley, MD  [facility] 03/10/2018, 11:27 AM Pager   If 7PM-7AM, please contact night-coverage [email or web address] Password [email or web address]

## 2018-03-11 ENCOUNTER — Inpatient Hospital Stay (HOSPITAL_BASED_OUTPATIENT_CLINIC_OR_DEPARTMENT_OTHER): Payer: Managed Care, Other (non HMO) | Admitting: Hematology and Oncology

## 2018-03-11 ENCOUNTER — Other Ambulatory Visit: Payer: Self-pay

## 2018-03-11 ENCOUNTER — Inpatient Hospital Stay: Payer: Managed Care, Other (non HMO)

## 2018-03-11 DIAGNOSIS — K219 Gastro-esophageal reflux disease without esophagitis: Secondary | ICD-10-CM

## 2018-03-11 DIAGNOSIS — Z171 Estrogen receptor negative status [ER-]: Secondary | ICD-10-CM

## 2018-03-11 DIAGNOSIS — C50812 Malignant neoplasm of overlapping sites of left female breast: Secondary | ICD-10-CM

## 2018-03-11 DIAGNOSIS — C50412 Malignant neoplasm of upper-outer quadrant of left female breast: Secondary | ICD-10-CM

## 2018-03-11 DIAGNOSIS — E876 Hypokalemia: Secondary | ICD-10-CM

## 2018-03-11 DIAGNOSIS — Z79899 Other long term (current) drug therapy: Secondary | ICD-10-CM

## 2018-03-11 LAB — COMPREHENSIVE METABOLIC PANEL
ALT: 48 U/L — ABNORMAL HIGH (ref 0–44)
AST: 28 U/L (ref 15–41)
Albumin: 3.8 g/dL (ref 3.5–5.0)
Alkaline Phosphatase: 64 U/L (ref 38–126)
Anion gap: 10 (ref 5–15)
BUN: 15 mg/dL (ref 6–20)
CO2: 25 mmol/L (ref 22–32)
Calcium: 9.3 mg/dL (ref 8.9–10.3)
Chloride: 100 mmol/L (ref 98–111)
Creatinine, Ser: 0.95 mg/dL (ref 0.44–1.00)
GFR calc Af Amer: >60 mL/min (ref >60)
GFR calc non Af Amer: >60 mL/min (ref >60)
Glucose, Bld: 102 mg/dL — ABNORMAL HIGH (ref 70–99)
Potassium: 4.2 mmol/L (ref 3.5–5.1)
SODIUM: 135 mmol/L (ref 135–145)
Total Bilirubin: 0.8 mg/dL (ref 0.3–1.2)
Total Protein: 6.6 g/dL (ref 6.5–8.1)

## 2018-03-11 LAB — CBC WITH DIFFERENTIAL/PLATELET
Abs Immature Granulocytes: 0.54 10*3/uL — ABNORMAL HIGH (ref 0.00–0.07)
Basophils Absolute: 0.1 10*3/uL (ref 0.0–0.1)
Basophils Relative: 1 %
EOS ABS: 0 10*3/uL (ref 0.0–0.5)
Eosinophils Relative: 0 %
HCT: 35.9 % — ABNORMAL LOW (ref 36.0–46.0)
Hemoglobin: 11.4 g/dL — ABNORMAL LOW (ref 12.0–15.0)
IMMATURE GRANULOCYTES: 8 %
Lymphocytes Relative: 25 %
Lymphs Abs: 1.8 10*3/uL (ref 0.7–4.0)
MCH: 29.9 pg (ref 26.0–34.0)
MCHC: 31.8 g/dL (ref 30.0–36.0)
MCV: 94.2 fL (ref 80.0–100.0)
Monocytes Absolute: 1.4 10*3/uL — ABNORMAL HIGH (ref 0.1–1.0)
Monocytes Relative: 20 %
NEUTROS PCT: 46 %
Neutro Abs: 3.2 10*3/uL (ref 1.7–7.7)
Platelets: 116 10*3/uL — ABNORMAL LOW (ref 150–400)
RBC: 3.81 MIL/uL — ABNORMAL LOW (ref 3.87–5.11)
RDW: 14.1 % (ref 11.5–15.5)
WBC: 7 10*3/uL (ref 4.0–10.5)
nRBC: 0.3 % — ABNORMAL HIGH (ref 0.0–0.2)

## 2018-03-11 MED ORDER — PANTOPRAZOLE SODIUM 40 MG PO TBEC: 40.0000 mg | DELAYED_RELEASE_TABLET | Freq: Every day | ORAL | 3 refills | Status: DC

## 2018-03-11 MED ORDER — POTASSIUM CHLORIDE CRYS ER 20 MEQ PO TBCR: 20.0000 meq | EXTENDED_RELEASE_TABLET | Freq: Every day | ORAL | 1 refills | Status: DC

## 2018-03-11 MED ORDER — DIPHENOXYLATE-ATROPINE 2.5-0.025 MG PO TABS: 1.0000 | ORAL_TABLET | Freq: Four times a day (QID) | ORAL | 3 refills | Status: DC | PRN

## 2018-03-11 MED FILL — PANTOPRAZOLE SOD DR 40 MG T: 40 | 30 days supply | Qty: 30 | Fill #0

## 2018-03-11 MED FILL — POTASSIUM CL ER 20 MEQ TAB: 20 | 30 days supply | Qty: 30 | Fill #0

## 2018-03-11 MED FILL — DIPHENOXYLATE-ATROPINE 2.5-: 2.5-0.025 | 7 days supply | Qty: 30 | Fill #0

## 2018-03-11 NOTE — Assessment & Plan Note (Signed)
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  Neoadjuvant chemotherapy with [place] followed by Herceptin Perjeta maintenance versus Kadcyla maintenance 2. possible mastectomy with targeted node dissection 3.  Adjuvant radiation therapy --------------------------------------------------------------------------------------------------------------------------------------------------- Current treatment: Cycle 1 day 8 TCH Perjeta Chemo toxicities: 1.  Recurrent syncope requiring hospitalization: Unclear etiology uncertain if it is related to chemotherapy. 2.  Based on the above symptom I will reduce the dosage of cycle 2 of chemotherapy. 3.  Diarrhea: 4.   Return to clinic in 2 weeks for cycle 2.

## 2018-03-12 ENCOUNTER — Telehealth: Payer: Self-pay | Admitting: Hematology and Oncology

## 2018-03-12 ENCOUNTER — Ambulatory Visit: Payer: Managed Care, Other (non HMO)

## 2018-03-12 NOTE — Progress Notes (Signed)
Juliann Pulse [phone] from [employer] called concerning a pt that was discharged 3 days ago [facility]. Juliann Pulse asked that pt's information be confirmed, which was on day one and day two. Juliann Pulse states, no [facility] agency has been found to take pt.

## 2018-03-12 NOTE — Telephone Encounter (Signed)
Per 12/19 no los

## 2018-03-13 LAB — CULTURE, BLOOD (ROUTINE X 2)
Culture: NO GROWTH
Culture: NO GROWTH
Special Requests: ADEQUATE
Special Requests: ADEQUATE

## 2018-03-15 ENCOUNTER — Other Ambulatory Visit: Payer: Managed Care, Other (non HMO)

## 2018-03-15 ENCOUNTER — Ambulatory Visit: Payer: Managed Care, Other (non HMO) | Admitting: Oncology

## 2018-03-18 ENCOUNTER — Telehealth: Payer: Self-pay

## 2018-03-18 NOTE — Telephone Encounter (Signed)
Returned patient's call regarding short term disability.   Patient will fax over forms to be completed.  Patient aware we will then forward to appropriate staff for completion and notify when complete. Patient voiced understanding.  No further needs at this time.

## 2018-03-19 ENCOUNTER — Other Ambulatory Visit: Payer: Managed Care, Other (non HMO)

## 2018-03-19 ENCOUNTER — Telehealth: Payer: Self-pay

## 2018-03-19 ENCOUNTER — Ambulatory Visit: Payer: Managed Care, Other (non HMO) | Admitting: Hematology and Oncology

## 2018-03-19 ENCOUNTER — Ambulatory Visit: Payer: Managed Care, Other (non HMO)

## 2018-03-19 NOTE — Telephone Encounter (Signed)
Spoke with patient to inform her that we received new paperwork for short term disability.  Will forward to Ivin Poot for completion.  Patient requested if this could be done ASAP d/t not receiving full pay at this time.

## 2018-03-23 ENCOUNTER — Encounter: Payer: Self-pay | Admitting: Hematology and Oncology

## 2018-03-24 DIAGNOSIS — Z9221 Personal history of antineoplastic chemotherapy: Secondary | ICD-10-CM

## 2018-03-24 DIAGNOSIS — Z923 Personal history of irradiation: Secondary | ICD-10-CM

## 2018-03-24 DIAGNOSIS — C50919 Malignant neoplasm of unspecified site of unspecified female breast: Secondary | ICD-10-CM

## 2018-03-24 HISTORY — DX: Personal history of antineoplastic chemotherapy: Z92.21

## 2018-03-24 HISTORY — DX: Malignant neoplasm of unspecified site of unspecified female breast: C50.919

## 2018-03-24 HISTORY — DX: Personal history of irradiation: Z92.3

## 2018-03-25 ENCOUNTER — Telehealth: Payer: Self-pay

## 2018-03-25 NOTE — Telephone Encounter (Signed)
Returned patient's call regarding short term disability paperwork completion.   Nurse left voicemail  Informing paperwork has been faxed as of today.

## 2018-03-25 NOTE — Progress Notes (Signed)
Attending Provider Statement for continuous leave has been successfully faxed to [facility] at [phone]. Mailed copy to patient address on file.

## 2018-03-26 ENCOUNTER — Encounter: Payer: Self-pay | Admitting: Adult Health

## 2018-03-26 ENCOUNTER — Telehealth: Payer: Self-pay

## 2018-03-26 ENCOUNTER — Encounter: Payer: Self-pay | Admitting: *Deleted

## 2018-03-26 ENCOUNTER — Inpatient Hospital Stay: Payer: Managed Care, Other (non HMO) | Attending: Hematology and Oncology

## 2018-03-26 ENCOUNTER — Inpatient Hospital Stay: Payer: Managed Care, Other (non HMO)

## 2018-03-26 ENCOUNTER — Inpatient Hospital Stay (HOSPITAL_BASED_OUTPATIENT_CLINIC_OR_DEPARTMENT_OTHER): Payer: Managed Care, Other (non HMO) | Admitting: Adult Health

## 2018-03-26 VITALS — BP 132/69 | HR 86 | Temp 98.7°F | Resp 18 | Ht 65.0 in | Wt 185.5 lb

## 2018-03-26 DIAGNOSIS — Z7689 Persons encountering health services in other specified circumstances: Secondary | ICD-10-CM | POA: Diagnosis not present

## 2018-03-26 DIAGNOSIS — C50412 Malignant neoplasm of upper-outer quadrant of left female breast: Secondary | ICD-10-CM

## 2018-03-26 DIAGNOSIS — C773 Secondary and unspecified malignant neoplasm of axilla and upper limb lymph nodes: Secondary | ICD-10-CM | POA: Insufficient documentation

## 2018-03-26 DIAGNOSIS — Z79899 Other long term (current) drug therapy: Secondary | ICD-10-CM | POA: Diagnosis not present

## 2018-03-26 DIAGNOSIS — Z5189 Encounter for other specified aftercare: Secondary | ICD-10-CM | POA: Diagnosis not present

## 2018-03-26 DIAGNOSIS — Z171 Estrogen receptor negative status [ER-]: Secondary | ICD-10-CM | POA: Diagnosis not present

## 2018-03-26 DIAGNOSIS — R197 Diarrhea, unspecified: Secondary | ICD-10-CM

## 2018-03-26 DIAGNOSIS — Z5111 Encounter for antineoplastic chemotherapy: Secondary | ICD-10-CM | POA: Insufficient documentation

## 2018-03-26 DIAGNOSIS — Z95828 Presence of other vascular implants and grafts: Secondary | ICD-10-CM

## 2018-03-26 DIAGNOSIS — R55 Syncope and collapse: Secondary | ICD-10-CM | POA: Diagnosis not present

## 2018-03-26 DIAGNOSIS — C50812 Malignant neoplasm of overlapping sites of left female breast: Secondary | ICD-10-CM

## 2018-03-26 DIAGNOSIS — Z5112 Encounter for antineoplastic immunotherapy: Secondary | ICD-10-CM | POA: Diagnosis present

## 2018-03-26 LAB — CBC WITH DIFFERENTIAL (CANCER CENTER ONLY)
Abs Immature Granulocytes: 0.04 10*3/uL (ref 0.00–0.07)
Basophils Absolute: 0 10*3/uL (ref 0.0–0.1)
Basophils Relative: 0 %
Eosinophils Absolute: 0 10*3/uL (ref 0.0–0.5)
Eosinophils Relative: 0 %
HCT: 30.2 % — ABNORMAL LOW (ref 36.0–46.0)
Hemoglobin: 9.7 g/dL — ABNORMAL LOW (ref 12.0–15.0)
Immature Granulocytes: 0 %
Lymphocytes Relative: 9 %
Lymphs Abs: 0.8 10*3/uL (ref 0.7–4.0)
MCH: 30 pg (ref 26.0–34.0)
MCHC: 32.1 g/dL (ref 30.0–36.0)
MCV: 93.5 fL (ref 80.0–100.0)
Monocytes Absolute: 0.2 10*3/uL (ref 0.1–1.0)
Monocytes Relative: 2 %
Neutro Abs: 8.1 10*3/uL — ABNORMAL HIGH (ref 1.7–7.7)
Neutrophils Relative %: 89 %
Platelet Count: 306 10*3/uL (ref 150–400)
RBC: 3.23 MIL/uL — ABNORMAL LOW (ref 3.87–5.11)
RDW: 15.1 % (ref 11.5–15.5)
WBC Count: 9.2 10*3/uL (ref 4.0–10.5)
nRBC: 0 % (ref 0.0–0.2)

## 2018-03-26 LAB — CMP (CANCER CENTER ONLY)
ALK PHOS: 77 U/L (ref 38–126)
ALT: 19 U/L (ref 0–44)
AST: 20 U/L (ref 15–41)
Albumin: 3.9 g/dL (ref 3.5–5.0)
Anion gap: 9 (ref 5–15)
BUN: 19 mg/dL (ref 6–20)
CALCIUM: 9.4 mg/dL (ref 8.9–10.3)
CO2: 23 mmol/L (ref 22–32)
Chloride: 106 mmol/L (ref 98–111)
Creatinine: 0.9 mg/dL (ref 0.44–1.00)
GFR, Est AFR Am: >60 mL/min (ref >60)
GFR, Estimated: >60 mL/min (ref >60)
Glucose, Bld: 109 mg/dL — ABNORMAL HIGH (ref 70–99)
Potassium: 4.3 mmol/L (ref 3.5–5.1)
Sodium: 138 mmol/L (ref 135–145)
Total Bilirubin: 0.7 mg/dL (ref 0.3–1.2)
Total Protein: 7.1 g/dL (ref 6.5–8.1)

## 2018-03-26 MED ORDER — SODIUM CHLORIDE 0.9% FLUSH
10.0000 mL | INTRAVENOUS | Status: DC | PRN
  Administered 2018-03-26: 10 mL
  Filled 2018-03-26: qty 10

## 2018-03-26 MED ORDER — SODIUM CHLORIDE 0.9 % IV SOLN
Freq: Once | INTRAVENOUS | Status: AC
  Administered 2018-03-26: 11:00:00 via INTRAVENOUS
  Filled 2018-03-26: qty 5

## 2018-03-26 MED ORDER — SODIUM CHLORIDE 0.9 % IV SOLN
700.0000 mg | Freq: Once | INTRAVENOUS | Status: AC
  Administered 2018-03-26: 700 mg via INTRAVENOUS
  Filled 2018-03-26: qty 70

## 2018-03-26 MED ORDER — DIPHENHYDRAMINE HCL 25 MG PO CAPS
ORAL_CAPSULE | ORAL | Status: AC
  Filled 2018-03-26: qty 2

## 2018-03-26 MED ORDER — HEPARIN SOD (PORK) LOCK FLUSH 100 UNIT/ML IV SOLN
500.0000 [IU] | Freq: Once | INTRAVENOUS | Status: AC | PRN
  Administered 2018-03-26: 500 [IU]
  Filled 2018-03-26: qty 5

## 2018-03-26 MED ORDER — PALONOSETRON HCL INJECTION 0.25 MG/5ML
INTRAVENOUS | Status: AC
  Filled 2018-03-26: qty 5

## 2018-03-26 MED ORDER — TRASTUZUMAB CHEMO 150 MG IV SOLR
6.0000 mg/kg | Freq: Once | INTRAVENOUS | Status: AC
  Administered 2018-03-26: 525 mg via INTRAVENOUS
  Filled 2018-03-26: qty 25

## 2018-03-26 MED ORDER — SODIUM CHLORIDE 0.9 % IV SOLN
Freq: Once | INTRAVENOUS | Status: AC
  Administered 2018-03-26: 10:00:00 via INTRAVENOUS
  Filled 2018-03-26: qty 250

## 2018-03-26 MED ORDER — PALONOSETRON HCL INJECTION 0.25 MG/5ML
0.2500 mg | Freq: Once | INTRAVENOUS | Status: AC
  Administered 2018-03-26: 0.25 mg via INTRAVENOUS

## 2018-03-26 MED ORDER — ACETAMINOPHEN 325 MG PO TABS
ORAL_TABLET | ORAL | Status: AC
  Filled 2018-03-26: qty 2

## 2018-03-26 MED ORDER — ACETAMINOPHEN 325 MG PO TABS
650.0000 mg | ORAL_TABLET | Freq: Once | ORAL | Status: AC
  Administered 2018-03-26: 650 mg via ORAL

## 2018-03-26 MED ORDER — SODIUM CHLORIDE 0.9 % IV SOLN
420.0000 mg | Freq: Once | INTRAVENOUS | Status: AC
  Administered 2018-03-26: 420 mg via INTRAVENOUS
  Filled 2018-03-26: qty 14

## 2018-03-26 MED ORDER — DIPHENHYDRAMINE HCL 25 MG PO CAPS
50.0000 mg | ORAL_CAPSULE | Freq: Once | ORAL | Status: AC
  Administered 2018-03-26: 50 mg via ORAL

## 2018-03-26 MED ORDER — SODIUM CHLORIDE 0.9 % IV SOLN
75.0000 mg/m2 | Freq: Once | INTRAVENOUS | Status: AC
  Administered 2018-03-26: 150 mg via INTRAVENOUS
  Filled 2018-03-26: qty 15

## 2018-03-26 NOTE — Assessment & Plan Note (Addendum)
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  Neoadjuvant chemotherapy with [place] followed by Herceptin Perjeta maintenance versus Kadcyla maintenance 2. possible mastectomy with targeted node dissection 3.  Adjuvant radiation therapy --------------------------------------------------------------------------------------------------------------------------------------------------- Current treatment: Cycle 2 day 1 TCH Perjeta  Echo 12/10 shows LVEF of 60-65%  Chemo toxicities: 1.  Recurrent syncope requiring hospitalization: Unclear etiology uncertain if it is related to chemotherapy, will undergo full outpatient cardiac and neurology work up.  I reviewed her chemotherapy doses with Dr. Lindi Adie, we will keep everything the same per his recommendation.  I reviewed this with Dynastie and she is aware and understands. 2.  Diarrhea: controlled with loperamide and imodium, encouraged increased fluid intake.   Return to clinic in 3 weeks for cycle 3.    I also reviewed Sharyn Lull with Bary Castilla, RN one of our breast navigators who will refer patient to Dr. Haroldine Laws for her next echocardiogram which will be due in March of 2020.

## 2018-03-26 NOTE — Progress Notes (Signed)
Christy Serrano Cancer Follow up:    Christy Serrano, [place] [address]   DIAGNOSIS: Cancer Staging Malignant neoplasm of left female breast [facility]) Staging form: Breast, AJCC 8th Edition - Clinical: Stage IIIA (cT3, cN1, cM0, G3, ER-, PR-, HER2+) - Signed by Nicholas Lose, MD on 02/03/2018   SUMMARY OF ONCOLOGIC HISTORY:   Malignant neoplasm of left female breast ([place])   01/18/2018 Initial Diagnosis    Screening mammogram detected microcalcifications and asymmetry, left breast segmental microcalcifications UOQ 1.5 x 4.3 x 5.4 cm biopsy-proven IDC with DCIS with LV I, grade 3, ER 0%, PR 0%, Ki-67 30%, HER-2 +3+ by IHC; hypoechoic mass 2 o'clock position 1.2 x 1.3 x 1.7 cm biopsy IDC, LV I present, grade 2-3, ER 0%, PR 0%, Ki-67 40%, HER-2 +3+, left axillary lymph node positive (3 nodes noted by [place]) T3 N1 stage IIIA    02/03/2018 Cancer Staging    Staging form: Breast, AJCC 8th Edition - Clinical: Stage IIIA (cT3, cN1, cM0, G3, ER-, PR-, HER2+) - Signed by Nicholas Lose, MD on 02/03/2018    02/20/2018 Genetic Testing    No pathogenic variants identified on the [facility] Expanded + RNA insight panel. Genes Analyzed (67 total): AIP, ALK, APC*, ATM*, BAP1, BARD1, BLM, BMPR1A, BRCA1*, BRCA2*, BRIP1*, CDH1*, CDK4, CDKN1B, CDKN2A, CHEK2*, DICER1, FANCC, FH, FLCN, GALNT12, HOXB13, MAX, MEN1, MET, MLH1*, MRE11A, MSH2*, MSH6*, MUTYH*, NBN, NF1*, NF2, PALB2*, PHOX2B, PMS2*, POLD1, POLE, POT1, PRKAR1A, PTCH1, PTEN*, RAD50, RAD51C*, RAD51D*, RB1, RET, SDHA, SDHAF2, SDHB, SDHC, SDHD, SMAD4, SMARCA4, SMARCB1, SMARCE1, STK11, SUFU, TMEM127, TP53*, TSC1, TSC2, VHL and XRCC2 (sequencing and deletion/duplication); MITF (sequencing only); EPCAM and GREM1 (deletion/duplication only). DNA and RNA analyses performed for * genes. The report date is 02/20/2018.    03/04/2018 -  Neo-Adjuvant Chemotherapy    Neoadjuvant chemotherapy with [facility] Perjeta    03/08/2018 - 03/10/2018  Hospital Admission    Admitted for recurrent syncope with loss of consciousness for up to 5 minutes with loss of bladder, no abnormalities identified.  MRI brain, carotid ultrasound, EEG studies were negative.     CURRENT THERAPY: TCHP  INTERVAL HISTORY: Christy Serrano 63 y.o. female returns for evaluation prior to receiving her second cycle of TCHP.  She is doing well today.  She had two syncopal episodes after her last cycle.  She notes that her initial work up was negative.  She has an appointment to have an event monitor placed on 1/14 and she also has neurology evaluation in 04/2018.  She has not had any further episodes.  She struggled with diarrhea initially following her treatment, however it improved with taking imodium and lomotil.  She has this on hand if she needs it after cycle 2.  She wants to know if she is going to have any dosage adjustments of her treatment today, as she and Dr. Lindi Adie discussed that possibility.  She still has her left breast palpable mass, however she denies any enlarging of the area.  She denies peripheral neuropathy as well today.     Patient Active Problem List   Diagnosis Date Noted  . Seizure-like activity ([place])   . Hyponatremia   . Recurrent syncope 03/08/2018  . LFT elevation   . Malignant neoplasm of left female breast ([place])   . Seizure ([place])   . Thrombocytopenia ([place])   . Port-A-Cath in place 03/04/2018  . Genetic testing 02/23/2018  . Family history of breast cancer   . Family history  of prostate cancer   . Family history of ovarian cancer   . 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 Prediabetes 01/06/2018  . Reactive airway disease without complication 88/75/7972  . Class 1 obesity with serious comorbidity and body mass index (BMI) of 32.0 to 32.9 in adult 12/22/2017    is allergic to oxycodone-acetaminophen and amoxicillin.  MEDICAL HISTORY: Past Medical History:  Diagnosis Date  . Family history of breast cancer    . Family history of ovarian cancer   . Family history of prostate cancer   . Lactose intolerance   . PONV (postoperative nausea and vomiting)     SURGICAL HISTORY: Past Surgical History:  Procedure Laterality Date  . ABDOMINAL HYSTERECTOMY    . PORTACATH PLACEMENT Right 03/03/2018   Procedure: INSERTION PORT-A-CATH;  Surgeon: Jovita Kussmaul, MD;  Location: [facility];  Service: General;  Laterality: Right;  . ROTATOR CUFF REPAIR    . TOTAL ABDOMINAL HYSTERECTOMY      SOCIAL HISTORY: Social History   Socioeconomic History  . Marital status: Single    Spouse name: Not on file  . Number of children: Not on file  . Years of education: Not on file  . Highest education level: Not on file  Occupational History  . Occupation: Quarry manager  Social Needs  . Financial resource strain: Not on file  . Food insecurity:    Worry: Not on file    Inability: Not on file  . Transportation needs:    Medical: No    Non-medical: No  Tobacco Use  . Smoking status: Never Smoker  . Smokeless tobacco: Never Used  Substance and Sexual Activity  . Alcohol use: No  . Drug use: No  . Sexual activity: Not on file  Lifestyle  . Physical activity:    Days per week: Not on file    Minutes per session: Not on file  . Stress: Not on file  Relationships  . Social connections:    Talks on phone: Not on file    Gets together: Not on file    Attends religious service: Not on file    Active member of club or organization: Not on file    Attends meetings of clubs or organizations: Not on file    Relationship status: Not on file  . Intimate partner violence:    Fear of current or ex partner: No    Emotionally abused: No    Physically abused: No    Forced sexual activity: No  Other Topics Concern  . Not on file  Social History Narrative  . Not on file    FAMILY HISTORY: Family History  Problem Relation Age of Onset  . High blood pressure Mother   . High blood pressure Father    . Prostate cancer Father        dx mid 39s  . Breast cancer Sister 81       negative Invitae 46 gene panel  . Breast cancer Paternal Aunt        dx mid 57s  . Cancer Paternal Uncle        jaw cancer d. 56s  . Ovarian cancer Maternal Grandmother        d. 10s  . Cancer Paternal Grandmother        either stomach or ovarian cancer  . Breast cancer Sister 71       "negative 21 gene panel"    Review of Systems  Constitutional: Negative for appetite change, chills, fatigue, fever and unexpected weight change.  HENT:   Negative for hearing loss, lump/mass and sore throat.   Eyes: Negative for eye problems and icterus.  Respiratory: Negative for chest tightness, cough and shortness of breath.   Cardiovascular: Negative for chest pain, leg swelling and palpitations.  Gastrointestinal: Positive for diarrhea (as per interval history). Negative for abdominal distention, abdominal pain, constipation, nausea and vomiting.  Endocrine: Negative for hot flashes.  Genitourinary: Negative for difficulty urinating.   Skin: Negative for itching and rash.  Neurological: Negative for dizziness, extremity weakness, headaches and numbness.  Hematological: Negative for adenopathy. Does not bruise/bleed easily.  Psychiatric/Behavioral: Negative for depression. The patient is not nervous/anxious.       PHYSICAL EXAMINATION  ECOG PERFORMANCE STATUS: 1 - Symptomatic but completely ambulatory  Vitals:   03/26/18 0932  BP: 132/69  Pulse: 86  Resp: 18  Temp: 98.7 F (37.1 C)  SpO2: 100%    Physical Exam Constitutional:      Appearance: Normal appearance.  HENT:     Head: Normocephalic.     Mouth/Throat:     Mouth: Mucous membranes are moist.     Pharynx: Oropharynx is clear. No oropharyngeal exudate.  Eyes:     General: No scleral icterus.    Pupils: Pupils are equal, round, and reactive to light.  Neck:     Musculoskeletal: Neck supple.  Cardiovascular:     Rate and Rhythm: Normal rate  and regular rhythm.     Heart sounds: Normal heart sounds.  Pulmonary:     Effort: Pulmonary effort is normal.     Breath sounds: Normal breath sounds.     Comments: + left breast mass palpated, no sign of progression, right breast benign Abdominal:     General: Abdomen is flat. There is no distension.     Palpations: Abdomen is soft.     Tenderness: There is no abdominal tenderness.  Musculoskeletal:        General: No swelling.  Lymphadenopathy:     Cervical: No cervical adenopathy.  Skin:    General: Skin is warm and dry.     Capillary Refill: Capillary refill takes less than 2 seconds.     Findings: No rash.  Neurological:     General: No focal deficit present.     Mental Status: She is alert.  Psychiatric:        Mood and Affect: Mood normal.        Behavior: Behavior normal.     LABORATORY DATA:  CBC    Component Value Date/Time   WBC 9.2 03/26/2018 0859   WBC 7.0 03/11/2018 1532   RBC 3.23 (L) 03/26/2018 0859   HGB 9.7 (L) 03/26/2018 0859   HGB 12.3 12/22/2017 1235   HCT 30.2 (L) 03/26/2018 0859   HCT 38.2 12/22/2017 1235   PLT 306 03/26/2018 0859   MCV 93.5 03/26/2018 0859   MCV 92 12/22/2017 1235   MCH 30.0 03/26/2018 0859   MCHC 32.1 03/26/2018 0859   RDW 15.1 03/26/2018 0859   RDW 14.8 12/22/2017 1235   LYMPHSABS 0.8 03/26/2018 0859   LYMPHSABS 2.0 12/22/2017 1235   MONOABS 0.2 03/26/2018 0859   EOSABS 0.0 03/26/2018 0859   EOSABS 0.0 12/22/2017 1235   BASOSABS 0.0 03/26/2018 0859   BASOSABS 0.0 12/22/2017 1235    CMP     Component Value Date/Time   NA 138 03/26/2018 0859   NA 138 12/22/2017 1235  K 4.3 03/26/2018 0859   CL 106 03/26/2018 0859   CO2 23 03/26/2018 0859   GLUCOSE 109 (H) 03/26/2018 0859   BUN 19 03/26/2018 0859   BUN 12 12/22/2017 1235   CREATININE 0.90 03/26/2018 0859   CALCIUM 9.4 03/26/2018 0859   PROT 7.1 03/26/2018 0859   PROT 7.6 12/22/2017 1235   ALBUMIN 3.9 03/26/2018 0859   ALBUMIN 4.8 12/22/2017 1235   AST 20  03/26/2018 0859   ALT 19 03/26/2018 0859   ALKPHOS 77 03/26/2018 0859   BILITOT 0.7 03/26/2018 0859   GFRNONAA >60 03/26/2018 0859   GFRAA >60 03/26/2018 0859        ASSESSMENT and PLAN:   Malignant neoplasm of left female breast ([place]) 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, grade 3, ER 0%, PR 0%, Ki-67 30%, HER-2 +3+ by IHC; hypoechoic mass 2 o'clock position 1.2 x 1.3 x 1.7 cm biopsy IDC, LV I present, grade 2-3, ER 0%, PR 0%, Ki-67 40%, HER-2 +3+, left axillary lymph node positive (3 nodes noted by [place]) T3 N1 stage IIIA  Treatment plan 1.  Neoadjuvant chemotherapy with [place] followed by Herceptin Perjeta maintenance versus Kadcy[place] maintenance 2. possible mastectomy with targeted node dissection 3.  Adjuvant radiation therapy --------------------------------------------------------------------------------------------------------------------------------------------------- Current treatment: Cycle 2 day 1 TCH Perjeta  Echo 12/10 shows LVEF of 60-65%  Chemo toxicities: 1.  Recurrent syncope requiring hospitalization: Unclear etiology uncertain if it is related to chemotherapy, will undergo full outpatient cardiac and neurology work up.  I reviewed her chemotherapy doses with Dr. Lindi Adie, we will keep everything the same per his recommendation.  I reviewed this with Opha and she is aware and understands. 2.  Diarrhea: controlled with loperamide and imodium, encouraged increased fluid intake.   Return to clinic in 3 weeks for cycle 3.    I also reviewed Sharyn Lull with Bary Castilla, RN one of our breast navigators who will refer patient to Dr. Haroldine Laws for her next echocardiogram which will be due in March of 2020.   All questions were answered. The patient knows to call the clinic with any problems, questions or concerns. We can certainly  see the patient much sooner if necessary.  A total of (30) minutes of face-to-face time was spent with this patient with greater than 50% of that time in counseling and care-coordination.  This note was electronically signed. Scot Dock, NP 03/26/2018

## 2018-03-26 NOTE — Telephone Encounter (Signed)
Printed avs and calender of upcoming appointment. Per 1/3 los 

## 2018-03-26 NOTE — Patient Instructions (Signed)
[facility] Discharge Instructions for Patients Receiving Chemotherapy  Today you received the following chemotherapy agents:  Herceptin, Perjeta, Taxotere, and Carboplatin.  To help prevent nausea and vomiting after your treatment, we encourage you to take your nausea medication as directed.   If you develop nausea and vomiting that is not controlled by your nausea medication, call the clinic.   BELOW ARE SYMPTOMS THAT SHOULD BE REPORTED IMMEDIATELY:  *FEVER GREATER THAN 100.5 F  *CHILLS WITH OR WITHOUT FEVER  NAUSEA AND VOMITING THAT IS NOT CONTROLLED WITH YOUR NAUSEA MEDICATION  *UNUSUAL SHORTNESS OF BREATH  *UNUSUAL BRUISING OR BLEEDING  TENDERNESS IN MOUTH AND THROAT WITH OR WITHOUT PRESENCE OF ULCERS  *URINARY PROBLEMS  *BOWEL PROBLEMS  UNUSUAL RASH Items with * indicate a potential emergency and should be followed up as soon as possible.  Feel free to call the clinic should you have any questions or concerns. The clinic phone number is [phone].  Please show the CHEMO ALERT CARD at check-in to the Emergency Department and triage nurse.   

## 2018-03-29 ENCOUNTER — Ambulatory Visit (INDEPENDENT_AMBULATORY_CARE_PROVIDER_SITE_OTHER): Payer: Managed Care, Other (non HMO) | Admitting: Bariatrics

## 2018-03-29 ENCOUNTER — Encounter (INDEPENDENT_AMBULATORY_CARE_PROVIDER_SITE_OTHER): Payer: Self-pay | Admitting: Bariatrics

## 2018-03-29 ENCOUNTER — Ambulatory Visit: Payer: Managed Care, Other (non HMO)

## 2018-03-29 VITALS — BP 139/72 | HR 63 | Temp 98.4°F | Ht 65.0 in | Wt 180.0 lb

## 2018-03-29 DIAGNOSIS — Z6830 Body mass index (BMI) 30.0-30.9, adult: Secondary | ICD-10-CM

## 2018-03-29 DIAGNOSIS — R7303 Prediabetes: Secondary | ICD-10-CM

## 2018-03-29 DIAGNOSIS — E669 Obesity, unspecified: Secondary | ICD-10-CM

## 2018-03-29 NOTE — Progress Notes (Signed)
Office: [phone]  /  Fax: [phone]   HPI:   Chief Complaint: OBESITY Christy Serrano is here to discuss her progress with her obesity treatment plan. She is on the Category 3 plan and is following her eating plan approximately 45 % of the time. She states she is walking 30 minutes 2 times per week. Christy Serrano is doing well overall. She is undergoing therapy for cancer (chemotherapy every 3 weeks) and her second treatment is on Friday. Her energy is fairly good and she denies any nausea. Her weight is 180 lb (81.6 kg) today and has had a weight loss of 4 pounds over a period of 5 weeks since her last visit. She has lost 15 lbs since starting treatment with [place].  Pre-Diabetes Christy Serrano has a diagnosis of prediabetes based on her elevated Hgb A1c and was informed this puts her at greater risk of developing diabetes. Her last A1c was at 6.1 and last insulin level was at 13.7. She is not taking metformin currently and continues to work on diet and exercise to decrease risk of diabetes. She denies polyphagia.  ASSESSMENT AND PLAN:  Prediabetes  Class 1 obesity with serious comorbidity and body mass index (BMI) of 30.0 to 30.9 in adult, unspecified obesity type  PLAN:  Pre-Diabetes Christy Serrano will continue to work on weight loss, exercise, increasing lean protein and decreasing simple carbohydrates in her diet to help decrease the risk of diabetes. She was informed that eating too many simple carbohydrates or too many calories at one sitting increases the likelihood of GI side effects. Christy Serrano agreed to follow up with [place] as directed to monitor her progress.  I spent > than 50% of the 15 minute visit on counseling as documented in the note.  Obesity Christy Serrano is currently in the action stage of change. As such, her goal is to continue with weight loss efforts She has agreed to follow the Category 3 plan Christy Serrano has been instructed to work up to a goal of 150 minutes of combined cardio and  strengthening exercise per week for weight loss and overall health benefits. We discussed the following Behavioral Modification Strategies today: increase H2O intake,no skipping meals, keeping healthy foods in the home, better snacking choices, increasing lean protein intake, decreasing simple carbohydrates , increasing vegetables and work on meal planning and easy cooking plans  Christy Serrano will watch for foods that irritate her stomach. Handouts for homemade seasonings and store bought seasonings were provided to patient today.  Christy Serrano has agreed to follow up with our clinic in 2 weeks. She was informed of the importance of frequent follow up visits to maximize her success with intensive lifestyle modifications for her multiple health conditions.  ALLERGIES: Allergies  Allergen Reactions  . Oxycodone-Acetaminophen Other (See Comments) and Palpitations    tachycardia   . Amoxicillin Rash    DID THE REACTION INVOLVE: Swelling of the face/tongue/throat, SOB, or low BP? No Sudden or severe rash/hives, skin peeling, or the inside of the mouth or nose? No Did it require medical treatment? No When did it last happen?in her 65's If all above answers are "NO", may proceed with cephalosporin use.     MEDICATIONS: Current Outpatient Medications on File Prior to Visit  Medication Sig Dispense Refill  . acetaminophen (TYLENOL) 500 MG tablet Take 1,000 mg by mouth every 6 (six) hours as needed for mild pain.    Marland Kitchen albuterol (PROVENTIL HFA;VENTOLIN HFA) 108 (90 Base) MCG/ACT inhaler Inhale into the lungs every 6 (six) hours as needed  for wheezing or shortness of breath.    . Calcium-Phosphorus-Vitamin D (CITRACAL +D3 PO) Take 1,200 mcg by mouth.    . cholecalciferol (VITAMIN D) 1000 units tablet Take 1,000 Units by mouth daily.    Marland Kitchen dexamethasone (DECADRON) 4 MG tablet Take 1 tablet (4 mg total) by mouth daily. Take 1 tablet day before chemo and 1 tablet day after with food 12 tablet 0  .  diphenoxylate-atropine (LOMOTIL) 2.5-0.025 MG tablet Take 1 tablet by mouth 4 (four) times daily as needed for diarrhea or loose stools. 30 tablet 3  . estradiol (VIVELLE-DOT) 0.075 MG/24HR Place 1 patch onto the skin 2 (two) times a week.     . lidocaine-prilocaine (EMLA) cream Apply to affected area once 30 g 3  . LORazepam (ATIVAN) 0.5 MG tablet Take 1 tablet (0.5 mg total) by mouth at bedtime as needed (Nausea or vomiting). 30 tablet 0  . Multiple Vitamin (MULTIVITAMIN) capsule Take 1 capsule by mouth daily.    . ondansetron (ZOFRAN) 8 MG tablet Take 1 tablet (8 mg total) by mouth 2 (two) times daily as needed (Nausea or vomiting). Begin 4 days after chemotherapy. 30 tablet 1  . pantoprazole (PROTONIX) 40 MG tablet Take 1 tablet (40 mg total) by mouth daily. 30 tablet 3  . potassium chloride SA (K-DUR,KLOR-CON) 20 MEQ tablet Take 1 tablet (20 mEq total) by mouth daily. 30 tablet 1  . prochlorperazine (COMPAZINE) 10 MG tablet Take 1 tablet (10 mg total) by mouth every 6 (six) hours as needed (Nausea or vomiting). 30 tablet 1  . traMADol (ULTRAM) 50 MG tablet Take 1-2 tablets (50-100 mg total) by mouth every 6 (six) hours as needed. 20 tablet 0   No current facility-administered medications on file prior to visit.     PAST MEDICAL HISTORY: Past Medical History:  Diagnosis Date  . Family history of breast cancer   . Family history of ovarian cancer   . Family history of prostate cancer   . Lactose intolerance   . PONV (postoperative nausea and vomiting)     PAST SURGICAL HISTORY: Past Surgical History:  Procedure Laterality Date  . ABDOMINAL HYSTERECTOMY    . PORTACATH PLACEMENT Right 03/03/2018   Procedure: INSERTION PORT-A-CATH;  Surgeon: Jovita Kussmaul, MD;  Location: [place];  Service: General;  Laterality: Right;  . ROTATOR CUFF REPAIR    . TOTAL ABDOMINAL HYSTERECTOMY      SOCIAL HISTORY: Social History   Tobacco Use  . Smoking status: Never Smoker  .  Smokeless tobacco: Never Used  Substance Use Topics  . Alcohol use: No  . Drug use: No    FAMILY HISTORY: Family History  Problem Relation Age of Onset  . High blood pressure Mother   . High blood pressure Father   . Prostate cancer Father        dx mid 35s  . Breast cancer Sister 35       negative Invitae 46 gene panel  . Breast cancer Paternal Aunt        dx mid 73s  . Cancer Paternal Uncle        jaw cancer d. 29s  . Ovarian cancer Maternal Grandmother        d. 26s  . Cancer Paternal Grandmother        either stomach or ovarian cancer  . Breast cancer Sister 61       "negative 21 gene panel"    ROS: Review of Systems  Constitutional: Positive for weight loss.  Gastrointestinal: Negative for nausea.  Endo/Heme/Allergies:       Negative for polyphagia    PHYSICAL EXAM: Blood pressure 139/72, pulse 63, temperature 98.4 F (36.9 C), temperature source Oral, height 5\' 5"  (1.651 m), weight 180 lb (81.6 kg), SpO2 100 %. Body mass index is 29.95 kg/m. Physical Exam Vitals signs reviewed.  Constitutional:      Appearance: Normal appearance. She is well-developed. She is obese.  Cardiovascular:     Rate and Rhythm: Normal rate.  Pulmonary:     Effort: Pulmonary effort is normal.  Musculoskeletal: Normal range of motion.  Skin:    General: Skin is warm and dry.  Neurological:     Mental Status: She is alert and oriented to person, place, and time.  Psychiatric:        Mood and Affect: Mood normal.        Behavior: Behavior normal.     RECENT LABS AND TESTS: BMET    Component Value Date/Time   NA 138 03/26/2018 0859   NA 138 12/22/2017 1235   K 4.3 03/26/2018 0859   CL 106 03/26/2018 0859   CO2 23 03/26/2018 0859   GLUCOSE 109 (H) 03/26/2018 0859   BUN 19 03/26/2018 0859   BUN 12 12/22/2017 1235   CREATININE 0.90 03/26/2018 0859   CALCIUM 9.4 03/26/2018 0859   GFRNONAA >60 03/26/2018 0859   GFRAA >60 03/26/2018 0859   Lab Results  Component Value  Date   HGBA1C 6.1 (H) 12/22/2017   Lab Results  Component Value Date   INSULIN 13.7 12/22/2017   CBC    Component Value Date/Time   WBC 9.2 03/26/2018 0859   WBC 7.0 03/11/2018 1532   RBC 3.23 (L) 03/26/2018 0859   HGB 9.7 (L) 03/26/2018 0859   HGB 12.3 12/22/2017 1235   HCT 30.2 (L) 03/26/2018 0859   HCT 38.2 12/22/2017 1235   PLT 306 03/26/2018 0859   MCV 93.5 03/26/2018 0859   MCV 92 12/22/2017 1235   MCH 30.0 03/26/2018 0859   MCHC 32.1 03/26/2018 0859   RDW 15.1 03/26/2018 0859   RDW 14.8 12/22/2017 1235   LYMPHSABS 0.8 03/26/2018 0859   LYMPHSABS 2.0 12/22/2017 1235   MONOABS 0.2 03/26/2018 0859   EOSABS 0.0 03/26/2018 0859   EOSABS 0.0 12/22/2017 1235   BASOSABS 0.0 03/26/2018 0859   BASOSABS 0.0 12/22/2017 1235   Iron/TIBC/Ferritin/ %Sat No results found for: IRON, TIBC, FERRITIN, IRONPCTSAT Lipid Panel     Component Value Date/Time   CHOL 176 12/22/2017 1235   TRIG 111 12/22/2017 1235   HDL 61 12/22/2017 1235   LDLCALC 93 12/22/2017 1235   Hepatic Function Panel     Component Value Date/Time   PROT 7.1 03/26/2018 0859   PROT 7.6 12/22/2017 1235   ALBUMIN 3.9 03/26/2018 0859   ALBUMIN 4.8 12/22/2017 1235   AST 20 03/26/2018 0859   ALT 19 03/26/2018 0859   ALKPHOS 77 03/26/2018 0859   BILITOT 0.7 03/26/2018 0859      Component Value Date/Time   TSH 3.644 03/10/2018 0538   TSH 1.930 12/22/2017 1235      OBESITY BEHAVIORAL INTERVENTION VISIT  Today's visit was # 6   Starting weight: 195 lbs Starting date: 12/22/2017 Today's weight : 180 lbs Today's date: 03/29/2018 Total lbs lost to date: 15   ASK: We discussed the diagnosis of obesity with Mackie Pai today and Sharyn Lull agreed to give [place] permission to discuss  obesity behavioral modification therapy today.  ASSESS: [place] has the diagnosis of obesity and her BMI today is 29.95 Jenavie is in the action stage of change   ADVISE: Shley was educated on the multiple health risks  of obesity as well as the benefit of weight loss to improve her health. She was advised of the need for long term treatment and the importance of lifestyle modifications to improve her current health and to decrease her risk of future health problems.  AGREE: Multiple dietary modification options and treatment options were discussed and  Gelila agreed to follow the recommendations documented in the above note.  ARRANGE: Krimson was educated on the importance of frequent visits to treat obesity as outlined per CMS and USPSTF guidelines and agreed to schedule her next follow up appointment today.  Corey Skains, am acting as Location manager for [employer]. Owens Shark, DO  I have reviewed the above documentation for accuracy and completeness, and I agree with the above. -Jearld Lesch, DO

## 2018-03-30 ENCOUNTER — Inpatient Hospital Stay: Payer: Managed Care, Other (non HMO)

## 2018-03-30 ENCOUNTER — Telehealth: Payer: Self-pay

## 2018-03-30 DIAGNOSIS — Z5112 Encounter for antineoplastic immunotherapy: Secondary | ICD-10-CM | POA: Diagnosis not present

## 2018-03-30 DIAGNOSIS — C50812 Malignant neoplasm of overlapping sites of left female breast: Secondary | ICD-10-CM

## 2018-03-30 DIAGNOSIS — Z171 Estrogen receptor negative status [ER-]: Principal | ICD-10-CM

## 2018-03-30 MED ORDER — PEGFILGRASTIM-CBQV 6 MG/0.6ML ~~LOC~~ SOSY
PREFILLED_SYRINGE | SUBCUTANEOUS | Status: AC
  Filled 2018-03-30: qty 0.6

## 2018-03-30 MED ORDER — PEGFILGRASTIM-CBQV 6 MG/0.6ML ~~LOC~~ SOSY
6.0000 mg | PREFILLED_SYRINGE | Freq: Once | SUBCUTANEOUS | Status: AC
  Administered 2018-03-30: 6 mg via SUBCUTANEOUS

## 2018-03-30 NOTE — Patient Instructions (Signed)
Pegfilgrastim injection What is this medicine? PEGFILGRASTIM (PEG fil gra stim) is a long-acting granulocyte colony-stimulating factor that stimulates the growth of neutrophils, a type of white blood cell important in the body's fight against infection. It is used to reduce the incidence of fever and infection in patients with certain types of cancer who are receiving chemotherapy that affects the bone marrow, and to increase survival after being exposed to high doses of radiation. This medicine may be used for other purposes; ask your health care provider or pharmacist if you have questions. COMMON BRAND NAME(S): Neulasta,Udenyca  What should I tell my health care provider before I take this medicine? They need to know if you have any of these conditions: -kidney disease -latex allergy -ongoing radiation therapy -sickle cell disease -skin reactions to acrylic adhesives (On-Body Injector only) -an unusual or allergic reaction to pegfilgrastim, filgrastim, other medicines, foods, dyes, or preservatives -pregnant or trying to get pregnant -breast-feeding How should I use this medicine? This medicine is for injection under the skin. If you get this medicine at home, you will be taught how to prepare and give the pre-filled syringe or how to use the On-body Injector. Refer to the patient Instructions for Use for detailed instructions. Use exactly as directed. Tell your healthcare provider immediately if you suspect that the On-body Injector may not have performed as intended or if you suspect the use of the On-body Injector resulted in a missed or partial dose. It is important that you put your used needles and syringes in a special sharps container. Do not put them in a trash can. If you do not have a sharps container, call your pharmacist or healthcare provider to get one. Talk to your pediatrician regarding the use of this medicine in children. While this drug may be prescribed for selected  conditions, precautions do apply. Overdosage: If you think you have taken too much of this medicine contact a poison control center or emergency room at once. NOTE: This medicine is only for you. Do not share this medicine with others. What if I miss a dose? It is important not to miss your dose. Call your doctor or health care professional if you miss your dose. If you miss a dose due to an On-body Injector failure or leakage, a new dose should be administered as soon as possible using a single prefilled syringe for manual use. What may interact with this medicine? Interactions have not been studied. Give your health care provider a list of all the medicines, herbs, non-prescription drugs, or dietary supplements you use. Also tell them if you smoke, drink alcohol, or use illegal drugs. Some items may interact with your medicine. This list may not describe all possible interactions. Give your health care provider a list of all the medicines, herbs, non-prescription drugs, or dietary supplements you use. Also tell them if you smoke, drink alcohol, or use illegal drugs. Some items may interact with your medicine. What should I watch for while using this medicine? You may need blood work done while you are taking this medicine. If you are going to need a MRI, CT scan, or other procedure, tell your doctor that you are using this medicine (On-Body Injector only). What side effects may I notice from receiving this medicine? Side effects that you should report to your doctor or health care professional as soon as possible: -allergic reactions like skin rash, itching or hives, swelling of the face, lips, or tongue -dizziness -fever -pain, redness, or irritation at   site where injected -pinpoint red spots on the skin -red or dark-brown urine -shortness of breath or breathing problems -stomach or side pain, or pain at the shoulder -swelling -tiredness -trouble passing urine or change in the amount of  urine Side effects that usually do not require medical attention (report to your doctor or health care professional if they continue or are bothersome): -bone pain -muscle pain This list may not describe all possible side effects. Call your doctor for medical advice about side effects. You may report side effects to FDA at 1-800-FDA-1088. Where should I keep my medicine? Keep out of the reach of children. Store pre-filled syringes in a refrigerator between 2 and 8 degrees C (36 and 46 degrees F). Do not freeze. Keep in carton to protect from light. Throw away this medicine if it is left out of the refrigerator for more than 48 hours. Throw away any unused medicine after the expiration date. NOTE: This sheet is a summary. It may not cover all possible information. If you have questions about this medicine, talk to your doctor, pharmacist, or health care provider.  2018 [employer]vier/Gold Standard (2016-03-06 12:58:03)  

## 2018-03-30 NOTE — Telephone Encounter (Signed)
Patient is aware of infusion added to her current schedule. Per 1/7 phone msg verification from book

## 2018-04-01 ENCOUNTER — Encounter: Payer: Self-pay | Admitting: Hematology and Oncology

## 2018-04-01 ENCOUNTER — Other Ambulatory Visit: Payer: Managed Care, Other (non HMO)

## 2018-04-01 ENCOUNTER — Ambulatory Visit: Payer: Managed Care, Other (non HMO)

## 2018-04-01 ENCOUNTER — Ambulatory Visit: Payer: Managed Care, Other (non HMO) | Admitting: Adult Health

## 2018-04-02 ENCOUNTER — Encounter: Payer: Self-pay | Admitting: Hematology and Oncology

## 2018-04-02 ENCOUNTER — Encounter: Payer: Self-pay | Admitting: *Deleted

## 2018-04-03 ENCOUNTER — Encounter: Payer: Self-pay | Admitting: Hematology and Oncology

## 2018-04-05 ENCOUNTER — Telehealth: Payer: Self-pay

## 2018-04-05 NOTE — Telephone Encounter (Signed)
Nurse received voicemail from Caleen Jobs with [employer] requesting additional information regarding what medical procedures and recent office visit dates.    Nurse returned call, left information on secured confidential voicemail.  Contact information left if additional information is needed.  Patient updated.

## 2018-04-06 ENCOUNTER — Ambulatory Visit (INDEPENDENT_AMBULATORY_CARE_PROVIDER_SITE_OTHER): Payer: Managed Care, Other (non HMO)

## 2018-04-06 DIAGNOSIS — R55 Syncope and collapse: Secondary | ICD-10-CM | POA: Diagnosis not present

## 2018-04-09 ENCOUNTER — Ambulatory Visit: Payer: Managed Care, Other (non HMO) | Admitting: Hematology and Oncology

## 2018-04-09 ENCOUNTER — Other Ambulatory Visit: Payer: Managed Care, Other (non HMO)

## 2018-04-09 ENCOUNTER — Ambulatory Visit: Payer: Managed Care, Other (non HMO)

## 2018-04-09 MED FILL — DIPHENOXYLATE/ATROPINE TAB: 2.5-0.025 | 7 days supply | Qty: 30 | Fill #1

## 2018-04-13 ENCOUNTER — Ambulatory Visit (INDEPENDENT_AMBULATORY_CARE_PROVIDER_SITE_OTHER): Payer: Managed Care, Other (non HMO) | Admitting: Bariatrics

## 2018-04-13 ENCOUNTER — Encounter (INDEPENDENT_AMBULATORY_CARE_PROVIDER_SITE_OTHER): Payer: Self-pay | Admitting: Bariatrics

## 2018-04-13 VITALS — BP 113/61 | HR 77 | Temp 98.0°F | Ht 65.0 in | Wt 180.0 lb

## 2018-04-13 DIAGNOSIS — Z6830 Body mass index (BMI) 30.0-30.9, adult: Secondary | ICD-10-CM

## 2018-04-13 DIAGNOSIS — Z853 Personal history of malignant neoplasm of breast: Secondary | ICD-10-CM

## 2018-04-13 DIAGNOSIS — E669 Obesity, unspecified: Secondary | ICD-10-CM | POA: Diagnosis not present

## 2018-04-13 DIAGNOSIS — R7303 Prediabetes: Secondary | ICD-10-CM | POA: Diagnosis not present

## 2018-04-14 NOTE — Progress Notes (Signed)
Office: [phone]  /  Fax: [phone]   HPI:   Chief Complaint: OBESITY Christy Serrano is here to discuss her progress with her obesity treatment plan. She is on the Category 3 plan and is following her eating plan approximately 50 % of the time. She states she is walking 30 minutes 2 times per week. Christy Serrano is doing well overall. She has struggled with planning secondary to her mother being in the hospital. It has been difficult secondary to chemotherapy.  Her weight is 180 lb (81.6 kg) today and has not lost weight since her last visit. She has lost 15 lbs since starting treatment with [place].  Pre-Diabetes Christy Serrano has a diagnosis of pre-diabetes based on her elevated Hgb A1c and was informed this puts her at greater risk of developing diabetes. Her last A1c was 6.1 and Insulin was 13.7. She is not taking medications currently and continues to work on diet and exercise to decrease risk of diabetes.   Left Breast Cancer Christy Serrano's tastes have changed secondary to chemotherapy.  ASSESSMENT AND PLAN:  Prediabetes  History of left breast cancer  Class 1 obesity with serious comorbidity and body mass index (BMI) of 30.0 to 30.9 in adult, unspecified obesity type  PLAN:  Pre-Diabetes Christy Serrano will continue to work on weight loss, exercise, and decreasing simple carbohydrates in her diet to help decrease the risk of diabetes.  She was informed that eating too many simple carbohydrates or too many calories at one sitting increases the likelihood of GI side effects. Thula agreed to increase her protein and decrease carbohydrates in her diet. She agrees to follow up with [place] as directed to monitor her progress in 2 weeks.  Left Breast Cancer Christy Serrano agrees to increase protein and water intake. It is ok if she has to have a shake instead for the nausea and vomiting.  I spent > than 50% of the 15 minute visit on counseling as documented in the note.  Obesity Christy Serrano is currently in the  action stage of change. As such, her goal is to continue with weight loss efforts. She has agreed to follow the Category 3 plan. She agrees to increase protein, water, and meal planning. Christy Serrano has been instructed to work up to a goal of 150 minutes of combined cardio and strengthening exercise per week for weight loss and overall health benefits. We discussed the following Behavioral Modification Stratagies today: increasing lean protein intake, decreasing simple carbohydrates, increasing vegetables, increase H20 intake, keeping healthy foods in the home, and work on meal planning and easy cooking plans.  Christy Serrano has agreed to follow up with our clinic in 2 weeks. She was informed of the importance of frequent follow up visits to maximize her success with intensive lifestyle modifications for her multiple health conditions.  ALLERGIES: Allergies  Allergen Reactions  . Oxycodone-Acetaminophen Other (See Comments) and Palpitations    tachycardia   . Amoxicillin Rash    DID THE REACTION INVOLVE: Swelling of the face/tongue/throat, SOB, or low BP? No Sudden or severe rash/hives, skin peeling, or the inside of the mouth or nose? No Did it require medical treatment? No When did it last happen?in her 79's If all above answers are "NO", may proceed with cephalosporin use.     MEDICATIONS: Current Outpatient Medications on File Prior to Visit  Medication Sig Dispense Refill  . acetaminophen (TYLENOL) 500 MG tablet Take 1,000 mg by mouth every 6 (six) hours as needed for mild pain.    Marland Kitchen albuterol (PROVENTIL HFA;VENTOLIN  HFA) 108 (90 Base) MCG/ACT inhaler Inhale into the lungs every 6 (six) hours as needed for wheezing or shortness of breath.    . Calcium-Phosphorus-Vitamin D (CITRACAL +D3 PO) Take 1,200 mcg by mouth.    . cholecalciferol (VITAMIN D) 1000 units tablet Take 1,000 Units by mouth daily.    Marland Kitchen dexamethasone (DECADRON) 4 MG tablet Take 1 tablet (4 mg total) by mouth daily.  Take 1 tablet day before chemo and 1 tablet day after with food 12 tablet 0  . diphenoxylate-atropine (LOMOTIL) 2.5-0.025 MG tablet Take 1 tablet by mouth 4 (four) times daily as needed for diarrhea or loose stools. 30 tablet 3  . estradiol (VIVELLE-DOT) 0.075 MG/24HR Place 1 patch onto the skin 2 (two) times a week.     . lidocaine-prilocaine (EMLA) cream Apply to affected area once 30 g 3  . LORazepam (ATIVAN) 0.5 MG tablet Take 1 tablet (0.5 mg total) by mouth at bedtime as needed (Nausea or vomiting). 30 tablet 0  . Multiple Vitamin (MULTIVITAMIN) capsule Take 1 capsule by mouth daily.    . ondansetron (ZOFRAN) 8 MG tablet Take 1 tablet (8 mg total) by mouth 2 (two) times daily as needed (Nausea or vomiting). Begin 4 days after chemotherapy. 30 tablet 1  . pantoprazole (PROTONIX) 40 MG tablet Take 1 tablet (40 mg total) by mouth daily. 30 tablet 3  . potassium chloride SA (K-DUR,KLOR-CON) 20 MEQ tablet Take 1 tablet (20 mEq total) by mouth daily. 30 tablet 1  . prochlorperazine (COMPAZINE) 10 MG tablet Take 1 tablet (10 mg total) by mouth every 6 (six) hours as needed (Nausea or vomiting). 30 tablet 1  . traMADol (ULTRAM) 50 MG tablet Take 1-2 tablets (50-100 mg total) by mouth every 6 (six) hours as needed. 20 tablet 0   No current facility-administered medications on file prior to visit.     PAST MEDICAL HISTORY: Past Medical History:  Diagnosis Date  . Family history of breast cancer   . Family history of ovarian cancer   . Family history of prostate cancer   . Lactose intolerance   . PONV (postoperative nausea and vomiting)     PAST SURGICAL HISTORY: Past Surgical History:  Procedure Laterality Date  . ABDOMINAL HYSTERECTOMY    . PORTACATH PLACEMENT Right 03/03/2018   Procedure: INSERTION PORT-A-CATH;  Surgeon: Jovita Kussmaul, MD;  Location: [facility];  Service: General;  Laterality: Right;  . ROTATOR CUFF REPAIR    . TOTAL ABDOMINAL HYSTERECTOMY      SOCIAL  HISTORY: Social History   Tobacco Use  . Smoking status: Never Smoker  . Smokeless tobacco: Never Used  Substance Use Topics  . Alcohol use: No  . Drug use: No    FAMILY HISTORY: Family History  Problem Relation Age of Onset  . High blood pressure Mother   . High blood pressure Father   . Prostate cancer Father        dx mid 47s  . Breast cancer Sister 24       negative [employer] 46 gene panel  . Breast cancer Paternal Aunt        dx mid 18s  . Cancer Paternal Uncle        jaw cancer d. 41s  . Ovarian cancer Maternal Grandmother        d. 5s  . Cancer Paternal Grandmother        either stomach or ovarian cancer  . Breast cancer Sister 36       "  negative 21 gene panel"    ROS: Review of Systems  Constitutional: Negative for weight loss.  Gastrointestinal: Positive for nausea and vomiting.   PHYSICAL EXAM: Blood pressure 113/61, pulse 77, temperature 98 F (36.7 C), temperature source Oral, height 5\' 5"  (1.651 m), weight 180 lb (81.6 kg), SpO2 98 %. Body mass index is 29.95 kg/m. Physical Exam Vitals signs reviewed.  Constitutional:      Appearance: Normal appearance. She is obese.  Cardiovascular:     Rate and Rhythm: Normal rate.  Pulmonary:     Effort: Pulmonary effort is normal.  Musculoskeletal: Normal range of motion.  Skin:    General: Skin is warm and dry.  Neurological:     Mental Status: She is alert and oriented to person, place, and time.  Psychiatric:        Mood and Affect: Mood normal.        Behavior: Behavior normal.    RECENT LABS AND TESTS: BMET    Component Value Date/Time   NA 138 03/26/2018 0859   NA 138 12/22/2017 1235   K 4.3 03/26/2018 0859   CL 106 03/26/2018 0859   CO2 23 03/26/2018 0859   GLUCOSE 109 (H) 03/26/2018 0859   BUN 19 03/26/2018 0859   BUN 12 12/22/2017 1235   CREATININE 0.90 03/26/2018 0859   CALCIUM 9.4 03/26/2018 0859   GFRNONAA >60 03/26/2018 0859   GFRAA >60 03/26/2018 0859   Lab Results  Component  Value Date   HGBA1C 6.1 (H) 12/22/2017   Lab Results  Component Value Date   INSULIN 13.7 12/22/2017   CBC    Component Value Date/Time   WBC 9.2 03/26/2018 0859   WBC 7.0 03/11/2018 1532   RBC 3.23 (L) 03/26/2018 0859   HGB 9.7 (L) 03/26/2018 0859   HGB 12.3 12/22/2017 1235   HCT 30.2 (L) 03/26/2018 0859   HCT 38.2 12/22/2017 1235   PLT 306 03/26/2018 0859   MCV 93.5 03/26/2018 0859   MCV 92 12/22/2017 1235   MCH 30.0 03/26/2018 0859   MCHC 32.1 03/26/2018 0859   RDW 15.1 03/26/2018 0859   RDW 14.8 12/22/2017 1235   LYMPHSABS 0.8 03/26/2018 0859   LYMPHSABS 2.0 12/22/2017 1235   MONOABS 0.2 03/26/2018 0859   EOSABS 0.0 03/26/2018 0859   EOSABS 0.0 12/22/2017 1235   BASOSABS 0.0 03/26/2018 0859   BASOSABS 0.0 12/22/2017 1235   Iron/TIBC/Ferritin/ %Sat No results found for: IRON, TIBC, FERRITIN, IRONPCTSAT Lipid Panel     Component Value Date/Time   CHOL 176 12/22/2017 1235   TRIG 111 12/22/2017 1235   HDL 61 12/22/2017 1235   LDLCALC 93 12/22/2017 1235   Hepatic Function Panel     Component Value Date/Time   PROT 7.1 03/26/2018 0859   PROT 7.6 12/22/2017 1235   ALBUMIN 3.9 03/26/2018 0859   ALBUMIN 4.8 12/22/2017 1235   AST 20 03/26/2018 0859   ALT 19 03/26/2018 0859   ALKPHOS 77 03/26/2018 0859   BILITOT 0.7 03/26/2018 0859      Component Value Date/Time   TSH 3.644 03/10/2018 0538   TSH 1.930 12/22/2017 1235   Results for Surber, Dayja D (MRN [id number]) as of 04/14/2018 08:20  Ref. Range 12/22/2017 12:35  Vitamin D, 25-Hydroxy Latest Ref Range: 30.0 - 100.0 ng/mL 41.9    OBESITY BEHAVIORAL INTERVENTION VISIT  Today's visit was # 7   Starting weight: 195 lbs Starting date: 12/22/17 Today's weight : Weight: 180 lb (81.6 kg)  Today's date:  04/13/2018 Total lbs lost to date: 15  ASK: We discussed the diagnosis of obesity with Mackie Pai today and Sheridyn agreed to give [place] permission to discuss obesity behavioral modification therapy  today.  ASSESS: Micheala has the diagnosis of obesity and her BMI today is 29.9. Celestial is in the action stage of change.   ADVISE: Maikayla was educated on the multiple health risks of obesity as well as the benefit of weight loss to improve her health. She was advised of the need for long term treatment and the importance of lifestyle modifications to improve her current health and to decrease her risk of future health problems.  AGREE: Multiple dietary modification options and treatment options were discussed and Shallen agreed to follow the recommendations documented in the above note.  ARRANGE: Anthony was educated on the importance of frequent visits to treat obesity as outlined per CMS and USPSTF guidelines and agreed to schedule her next follow up appointment today.  I, Marcille Blanco, am acting as Location manager for [employer]. Owens Shark, DO  I have reviewed the above documentation for accuracy and completeness, and I agree with the above. -Jearld Lesch, DO

## 2018-04-16 ENCOUNTER — Inpatient Hospital Stay: Payer: Managed Care, Other (non HMO)

## 2018-04-16 ENCOUNTER — Inpatient Hospital Stay (HOSPITAL_BASED_OUTPATIENT_CLINIC_OR_DEPARTMENT_OTHER): Payer: Managed Care, Other (non HMO) | Admitting: Adult Health

## 2018-04-16 ENCOUNTER — Encounter: Payer: Self-pay | Admitting: Adult Health

## 2018-04-16 ENCOUNTER — Telehealth: Payer: Self-pay | Admitting: Adult Health

## 2018-04-16 ENCOUNTER — Encounter: Payer: Self-pay | Admitting: *Deleted

## 2018-04-16 VITALS — BP 144/66 | HR 89 | Temp 98.3°F | Resp 17 | Ht 65.0 in | Wt 185.1 lb

## 2018-04-16 VITALS — BP 139/64 | HR 77 | Temp 98.5°F | Resp 18

## 2018-04-16 DIAGNOSIS — C773 Secondary and unspecified malignant neoplasm of axilla and upper limb lymph nodes: Secondary | ICD-10-CM

## 2018-04-16 DIAGNOSIS — C50812 Malignant neoplasm of overlapping sites of left female breast: Secondary | ICD-10-CM

## 2018-04-16 DIAGNOSIS — R55 Syncope and collapse: Secondary | ICD-10-CM

## 2018-04-16 DIAGNOSIS — Z5112 Encounter for antineoplastic immunotherapy: Secondary | ICD-10-CM | POA: Diagnosis not present

## 2018-04-16 DIAGNOSIS — Z171 Estrogen receptor negative status [ER-]: Principal | ICD-10-CM

## 2018-04-16 DIAGNOSIS — C50412 Malignant neoplasm of upper-outer quadrant of left female breast: Secondary | ICD-10-CM | POA: Diagnosis not present

## 2018-04-16 DIAGNOSIS — Z95828 Presence of other vascular implants and grafts: Secondary | ICD-10-CM

## 2018-04-16 LAB — CBC WITH DIFFERENTIAL/PLATELET
Abs Immature Granulocytes: 0.03 10*3/uL (ref 0.00–0.07)
BASOS ABS: 0 10*3/uL (ref 0.0–0.1)
Basophils Relative: 0 %
EOS PCT: 0 %
Eosinophils Absolute: 0 10*3/uL (ref 0.0–0.5)
HEMATOCRIT: 29.6 % — AB (ref 36.0–46.0)
Hemoglobin: 9.5 g/dL — ABNORMAL LOW (ref 12.0–15.0)
Immature Granulocytes: 1 %
Lymphocytes Relative: 12 %
Lymphs Abs: 0.8 10*3/uL (ref 0.7–4.0)
MCH: 30.3 pg (ref 26.0–34.0)
MCHC: 32.1 g/dL (ref 30.0–36.0)
MCV: 94.3 fL (ref 80.0–100.0)
Monocytes Absolute: 0.1 10*3/uL (ref 0.1–1.0)
Monocytes Relative: 2 %
NRBC: 0 % (ref 0.0–0.2)
Neutro Abs: 5.7 10*3/uL (ref 1.7–7.7)
Neutrophils Relative %: 85 %
Platelets: 276 10*3/uL (ref 150–400)
RBC: 3.14 MIL/uL — ABNORMAL LOW (ref 3.87–5.11)
RDW: 17.5 % — ABNORMAL HIGH (ref 11.5–15.5)
WBC: 6.6 10*3/uL (ref 4.0–10.5)

## 2018-04-16 LAB — COMPREHENSIVE METABOLIC PANEL
ALT: 32 U/L (ref 0–44)
AST: 33 U/L (ref 15–41)
Albumin: 3.9 g/dL (ref 3.5–5.0)
Alkaline Phosphatase: 95 U/L (ref 38–126)
Anion gap: 8 (ref 5–15)
BUN: 11 mg/dL (ref 6–20)
CO2: 25 mmol/L (ref 22–32)
Calcium: 9.3 mg/dL (ref 8.9–10.3)
Chloride: 106 mmol/L (ref 98–111)
Creatinine, Ser: 0.85 mg/dL (ref 0.44–1.00)
GFR calc non Af Amer: >60 mL/min (ref >60)
Glucose, Bld: 113 mg/dL — ABNORMAL HIGH (ref 70–99)
Potassium: 4.4 mmol/L (ref 3.5–5.1)
Sodium: 139 mmol/L (ref 135–145)
Total Bilirubin: 0.5 mg/dL (ref 0.3–1.2)
Total Protein: 7 g/dL (ref 6.5–8.1)

## 2018-04-16 MED ORDER — DIPHENHYDRAMINE HCL 25 MG PO CAPS
50.0000 mg | ORAL_CAPSULE | Freq: Once | ORAL | Status: AC
  Administered 2018-04-16: 50 mg via ORAL

## 2018-04-16 MED ORDER — PALONOSETRON HCL INJECTION 0.25 MG/5ML
0.2500 mg | Freq: Once | INTRAVENOUS | Status: AC
  Administered 2018-04-16: 0.25 mg via INTRAVENOUS

## 2018-04-16 MED ORDER — SODIUM CHLORIDE 0.9 % IV SOLN
Freq: Once | INTRAVENOUS | Status: AC
  Administered 2018-04-16: 11:00:00 via INTRAVENOUS
  Filled 2018-04-16: qty 5

## 2018-04-16 MED ORDER — SODIUM CHLORIDE 0.9% FLUSH
10.0000 mL | INTRAVENOUS | Status: DC | PRN
  Administered 2018-04-16: 10 mL
  Filled 2018-04-16: qty 10

## 2018-04-16 MED ORDER — DIPHENHYDRAMINE HCL 25 MG PO CAPS
ORAL_CAPSULE | ORAL | Status: AC
  Filled 2018-04-16: qty 2

## 2018-04-16 MED ORDER — ACETAMINOPHEN 325 MG PO TABS
ORAL_TABLET | ORAL | Status: AC
  Filled 2018-04-16: qty 2

## 2018-04-16 MED ORDER — PALONOSETRON HCL INJECTION 0.25 MG/5ML
INTRAVENOUS | Status: AC
  Filled 2018-04-16: qty 5

## 2018-04-16 MED ORDER — SODIUM CHLORIDE 0.9 % IV SOLN
Freq: Once | INTRAVENOUS | Status: AC
  Administered 2018-04-16: 11:00:00 via INTRAVENOUS
  Filled 2018-04-16: qty 250

## 2018-04-16 MED ORDER — SODIUM CHLORIDE 0.9 % IV SOLN
75.0000 mg/m2 | Freq: Once | INTRAVENOUS | Status: AC
  Administered 2018-04-16: 150 mg via INTRAVENOUS
  Filled 2018-04-16: qty 15

## 2018-04-16 MED ORDER — SODIUM CHLORIDE 0.9 % IV SOLN
700.0000 mg | Freq: Once | INTRAVENOUS | Status: AC
  Administered 2018-04-16: 700 mg via INTRAVENOUS
  Filled 2018-04-16: qty 70

## 2018-04-16 MED ORDER — ACETAMINOPHEN 325 MG PO TABS
650.0000 mg | ORAL_TABLET | Freq: Once | ORAL | Status: AC
  Administered 2018-04-16: 650 mg via ORAL

## 2018-04-16 MED ORDER — CHOLESTYRAMINE 4 G PO PACK: 4.0000 g | PACK | Freq: Two times a day (BID) | ORAL | 1 refills | Status: DC

## 2018-04-16 MED ORDER — SODIUM CHLORIDE 0.9 % IV SOLN
420.0000 mg | Freq: Once | INTRAVENOUS | Status: AC
  Administered 2018-04-16: 420 mg via INTRAVENOUS
  Filled 2018-04-16: qty 14

## 2018-04-16 MED ORDER — TRASTUZUMAB CHEMO 150 MG IV SOLR
6.0000 mg/kg | Freq: Once | INTRAVENOUS | Status: AC
  Administered 2018-04-16: 525 mg via INTRAVENOUS
  Filled 2018-04-16: qty 25

## 2018-04-16 MED ORDER — HEPARIN SOD (PORK) LOCK FLUSH 100 UNIT/ML IV SOLN
500.0000 [IU] | Freq: Once | INTRAVENOUS | Status: AC | PRN
  Administered 2018-04-16: 500 [IU]
  Filled 2018-04-16: qty 5

## 2018-04-16 MED FILL — CHOLESTYRAMINE PACKET: 4 | 30 days supply | Qty: 60 | Fill #0

## 2018-04-16 NOTE — Telephone Encounter (Signed)
No los °

## 2018-04-16 NOTE — Assessment & Plan Note (Addendum)
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  Neoadjuvant chemotherapy with [place] followed by Herceptin Perjeta maintenance versus Kadcyla maintenance 2. possible mastectomy with targeted node dissection 3.  Adjuvant radiation therapy --------------------------------------------------------------------------------------------------------------------------------------------------- Current treatment: Cycle 3 day 1 TCH Perjeta  Echo 12/10 shows LVEF of 60-65%  Chemo toxicities: 1.  Recurrent syncope requiring hospitalization: Unclear etiology, no further issues, she will continue her complete work up with cardiac monitoring and neuro eval in a few weeks.   2.  Diarrhea: Imodium and Lomotil.  Will add Questran.  Counseled on how to take.   3. Nail dyscrasia: No brittleness at this point, recommended she keep nails trim and moisturized. 4. Scalp Discomfort: I suggested she try OTC benadryl cream or aquaphor.     Return to clinic in 3 weeks for cycle 4.

## 2018-04-16 NOTE — Progress Notes (Signed)
[facility] Follow up:    Christy Serrano, [place] [address]   DIAGNOSIS: Cancer Staging Malignant neoplasm of left female breast [facility]) Staging form: Breast, AJCC 8th Edition - Clinical: Stage IIIA (cT3, cN1, cM0, G3, ER-, PR-, HER2+) - Signed by Nicholas Lose, MD on 02/03/2018   SUMMARY OF ONCOLOGIC HISTORY:   Malignant neoplasm of left female breast ([place])   01/18/2018 Initial Diagnosis    Screening mammogram detected microcalcifications and asymmetry, left breast segmental microcalcifications UOQ 1.5 x 4.3 x 5.4 cm biopsy-proven IDC with DCIS with LV I, grade 3, ER 0%, PR 0%, Ki-67 30%, HER-2 +3+ by IHC; hypoechoic mass 2 o'clock position 1.2 x 1.3 x 1.7 cm biopsy IDC, LV I present, grade 2-3, ER 0%, PR 0%, Ki-67 40%, HER-2 +3+, left axillary lymph node positive (3 nodes noted by [place]) T3 N1 stage IIIA    02/03/2018 Cancer Staging    Staging form: Breast, AJCC 8th Edition - Clinical: Stage IIIA (cT3, cN1, cM0, G3, ER-, PR-, HER2+) - Signed by Nicholas Lose, MD on 02/03/2018    02/20/2018 Genetic Testing    No pathogenic variants identified on the [facility] Expanded + RNA insight panel. Genes Analyzed (67 total): AIP, ALK, APC*, ATM*, BAP1, BARD1, BLM, BMPR1A, BRCA1*, BRCA2*, BRIP1*, CDH1*, CDK4, CDKN1B, CDKN2A, CHEK2*, DICER1, FANCC, FH, FLCN, GALNT12, HOXB13, MAX, MEN1, MET, MLH1*, MRE11A, MSH2*, MSH6*, MUTYH*, NBN, NF1*, NF2, PALB2*, PHOX2B, PMS2*, POLD1, POLE, POT1, PRKAR1A, PTCH1, PTEN*, RAD50, RAD51C*, RAD51D*, RB1, RET, SDHA, SDHAF2, SDHB, SDHC, SDHD, SMAD4, SMARCA4, SMARCB1, SMARCE1, STK11, SUFU, TMEM127, TP53*, TSC1, TSC2, VHL and XRCC2 (sequencing and deletion/duplication); MITF (sequencing only); EPCAM and GREM1 (deletion/duplication only). DNA and RNA analyses performed for * genes. The report date is 02/20/2018.    03/04/2018 -  Neo-Adjuvant Chemotherapy    Neoadjuvant chemotherapy with [facility] Perjeta    03/08/2018 - 03/10/2018  Hospital Admission    Admitted for recurrent syncope with loss of consciousness for up to 5 minutes with loss of bladder, no abnormalities identified.  MRI brain, carotid ultrasound, EEG studies were negative.     CURRENT THERAPY:TCHP  INTERVAL HISTORY: Christy Serrano 63 y.o. female returns for evaluation prior to receiving her third cycle of neoadjuvant TCHP.  She is doing well today.  She denies any peripheral neuropathy.  She notes her nails are changing color and becoming darker at the bases.  She continues to note some firmness in the left lateral breast.  She has persistent diarrhea, worse initially, then tapers off.  She is taking imodium and lomotil as directed.    Christy Serrano is wearing a heart monitor, to continue her thorough work up from her syncopal episode last month.  She has not had any issues with dizziness, light headedness, seizures, passing out, or anything else.  She does note some indigestion worse the first week following her treatment.  She notes her scalp gets irritated easily and wants to know if there is anything that may help.     Patient Active Problem List   Diagnosis Date Noted  . Seizure-like activity ([place])   . Hyponatremia   . Recurrent syncope 03/08/2018  . LFT elevation   . Malignant neoplasm of left female breast ([place])   . Seizure ([place])   . Thrombocytopenia ([place])   . Port-A-Cath in place 03/04/2018  . Genetic testing 02/23/2018  . Family history of breast cancer   . Family history of prostate cancer   . Family history of  ovarian cancer   . Malignant neoplasm of left female breast ([place]) 02/03/2018  . Prediabetes 01/06/2018  . Reactive airway disease without complication 25/07/3974  . Class 1 obesity with serious comorbidity and body mass index (BMI) of 32.0 to 32.9 in adult 12/22/2017    is allergic to oxycodone-acetaminophen and amoxicillin.  MEDICAL HISTORY: Past Medical History:  Diagnosis Date  . Family history of breast cancer   . Family  history of ovarian cancer   . Family history of prostate cancer   . Lactose intolerance   . PONV (postoperative nausea and vomiting)     SURGICAL HISTORY: Past Surgical History:  Procedure Laterality Date  . ABDOMINAL HYSTERECTOMY    . PORTACATH PLACEMENT Right 03/03/2018   Procedure: INSERTION PORT-A-CATH;  Surgeon: Jovita Kussmaul, MD;  Location: [facility];  Service: General;  Laterality: Right;  . ROTATOR CUFF REPAIR    . TOTAL ABDOMINAL HYSTERECTOMY      SOCIAL HISTORY: Social History   Socioeconomic History  . Marital status: Single    Spouse name: Not on file  . Number of children: Not on file  . Years of education: Not on file  . Highest education level: Not on file  Occupational History  . Occupation: Quarry manager  Social Needs  . Financial resource strain: Not on file  . Food insecurity:    Worry: Not on file    Inability: Not on file  . Transportation needs:    Medical: No    Non-medical: No  Tobacco Use  . Smoking status: Never Smoker  . Smokeless tobacco: Never Used  Substance and Sexual Activity  . Alcohol use: No  . Drug use: No  . Sexual activity: Not on file  Lifestyle  . Physical activity:    Days per week: Not on file    Minutes per session: Not on file  . Stress: Not on file  Relationships  . Social connections:    Talks on phone: Not on file    Gets together: Not on file    Attends religious service: Not on file    Active member of club or organization: Not on file    Attends meetings of clubs or organizations: Not on file    Relationship status: Not on file  . Intimate partner violence:    Fear of current or ex partner: No    Emotionally abused: No    Physically abused: No    Forced sexual activity: No  Other Topics Concern  . Not on file  Social History Narrative  . Not on file    FAMILY HISTORY: Family History  Problem Relation Age of Onset  . High blood pressure Mother   . High blood pressure Father   . Prostate  cancer Father        dx mid 22s  . Breast cancer Sister 41       negative [employer] 46 gene panel  . Breast cancer Paternal Aunt        dx mid 26s  . Cancer Paternal Uncle        jaw cancer d. 110s  . Ovarian cancer Maternal Grandmother        d. 26s  . Cancer Paternal Grandmother        either stomach or ovarian cancer  . Breast cancer Sister 42       "negative 21 gene panel"    Review of Systems  Constitutional: Positive for fatigue. Negative for appetite change, chills  and unexpected weight change.  HENT:   Negative for hearing loss, lump/mass, mouth sores and trouble swallowing.   Eyes: Negative for eye problems and icterus.  Respiratory: Negative for chest tightness, cough and shortness of breath.   Cardiovascular: Negative for chest pain, leg swelling and palpitations.  Gastrointestinal: Positive for diarrhea. Negative for abdominal distention, abdominal pain, constipation, nausea and vomiting.  Endocrine: Negative for hot flashes.  Genitourinary: Negative for difficulty urinating.   Musculoskeletal: Negative for arthralgias and gait problem.  Skin: Negative for itching and rash.  Neurological: Negative for dizziness, extremity weakness, gait problem, headaches, numbness and seizures.  Hematological: Negative for adenopathy. Does not bruise/bleed easily.  Psychiatric/Behavioral: Negative for depression. The patient is not nervous/anxious.       PHYSICAL EXAMINATION  ECOG PERFORMANCE STATUS: 1 - Symptomatic but completely ambulatory  Vitals:   04/16/18 0858  BP: (!) 144/66  Pulse: 89  Resp: 17  Temp: 98.3 F (36.8 C)  SpO2: 100%    Physical Exam Constitutional:      Appearance: Normal appearance. She is not toxic-appearing.  HENT:     Head: Normocephalic and atraumatic.     Mouth/Throat:     Mouth: Mucous membranes are moist.     Pharynx: Oropharynx is clear. No oropharyngeal exudate or posterior oropharyngeal erythema.  Eyes:     General: No scleral  icterus. Neck:     Musculoskeletal: Normal range of motion and neck supple.  Cardiovascular:     Rate and Rhythm: Normal rate and regular rhythm.     Heart sounds: Normal heart sounds.  Pulmonary:     Effort: Pulmonary effort is normal.     Breath sounds: Normal breath sounds.     Comments: Left breast with small 1cm area of firmness, no sign of progression, right breast benign Abdominal:     General: Abdomen is flat. Bowel sounds are normal. There is no distension.     Palpations: Abdomen is soft. There is no mass.     Tenderness: There is no abdominal tenderness.  Musculoskeletal:        General: No swelling.  Lymphadenopathy:     Cervical: No cervical adenopathy.  Skin:    General: Skin is warm and dry.     Capillary Refill: Capillary refill takes less than 2 seconds.     Comments: +darkening at the base of the nail bed  Neurological:     General: No focal deficit present.     Mental Status: She is alert.  Psychiatric:        Mood and Affect: Mood normal.        Behavior: Behavior normal.     LABORATORY DATA:  CBC    Component Value Date/Time   WBC 6.6 04/16/2018 0856   RBC 3.14 (L) 04/16/2018 0856   HGB 9.5 (L) 04/16/2018 0856   HGB 9.7 (L) 03/26/2018 0859   HGB 12.3 12/22/2017 1235   HCT 29.6 (L) 04/16/2018 0856   HCT 38.2 12/22/2017 1235   PLT 276 04/16/2018 0856   PLT 306 03/26/2018 0859   MCV 94.3 04/16/2018 0856   MCV 92 12/22/2017 1235   MCH 30.3 04/16/2018 0856   MCHC 32.1 04/16/2018 0856   RDW 17.5 (H) 04/16/2018 0856   RDW 14.8 12/22/2017 1235   LYMPHSABS 0.8 04/16/2018 0856   LYMPHSABS 2.0 12/22/2017 1235   MONOABS 0.1 04/16/2018 0856   EOSABS 0.0 04/16/2018 0856   EOSABS 0.0 12/22/2017 1235   BASOSABS 0.0 04/16/2018 0856  BASOSABS 0.0 12/22/2017 1235    CMP     Component Value Date/Time   NA 139 04/16/2018 0856   NA 138 12/22/2017 1235   K 4.4 04/16/2018 0856   CL 106 04/16/2018 0856   CO2 25 04/16/2018 0856   GLUCOSE 113 (H)  04/16/2018 0856   BUN 11 04/16/2018 0856   BUN 12 12/22/2017 1235   CREATININE 0.85 04/16/2018 0856   CREATININE 0.90 03/26/2018 0859   CALCIUM 9.3 04/16/2018 0856   PROT 7.0 04/16/2018 0856   PROT 7.6 12/22/2017 1235   ALBUMIN 3.9 04/16/2018 0856   ALBUMIN 4.8 12/22/2017 1235   AST 33 04/16/2018 0856   AST 20 03/26/2018 0859   ALT 32 04/16/2018 0856   ALT 19 03/26/2018 0859   ALKPHOS 95 04/16/2018 0856   BILITOT 0.5 04/16/2018 0856   BILITOT 0.7 03/26/2018 0859   GFRNONAA >60 04/16/2018 0856   GFRNONAA >60 03/26/2018 0859   GFRAA >60 04/16/2018 0856   GFRAA >60 03/26/2018 0859         ASSESSMENT and PLAN:   Malignant neoplasm of left female breast ([place]) 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, grade 3, ER 0%, PR 0%, Ki-67 30%, HER-2 +3+ by IHC; hypoechoic mass 2 o'clock position 1.2 x 1.3 x 1.7 cm biopsy IDC, LV I present, grade 2-3, ER 0%, PR 0%, Ki-67 40%, HER-2 +3+, left axillary lymph node positive (3 nodes noted by [place]) T3 N1 stage IIIA  Treatment plan 1.  Neoadjuvant chemotherapy with [place] followed by Herceptin Perjeta maintenance versus Kadcyla maintenance 2. possible mastectomy with targeted node dissection 3.  Adjuvant radiation therapy --------------------------------------------------------------------------------------------------------------------------------------------------- Current treatment: Cycle 3 day 1 TCH Perjeta  Echo 12/10 shows LVEF of 60-65%  Chemo toxicities: 1.  Recurrent syncope requiring hospitalization: Unclear etiology, no further issues, she will continue her complete work up with cardiac monitoring and neuro eval in a few weeks.   2.  Diarrhea: Imodium and Lomotil.  Will add Questran.  Counseled on how to take.   3. Nail dyscrasia: No brittleness at this point, recommended she keep nails trim and  moisturized. 4. Scalp Discomfort: I suggested she try OTC benadryl cream or aquaphor.     Return to clinic in 3 weeks for cycle 4.      All questions were answered. The patient knows to call the clinic with any problems, questions or concerns. We can certainly see the patient much sooner if necessary.  A total of (30) minutes of face-to-face time was spent with this patient with greater than 50% of that time in counseling and care-coordination.  This note was electronically signed. Scot Dock, NP 04/16/2018

## 2018-04-16 NOTE — Patient Instructions (Signed)
[facility] Discharge Instructions for Patients Receiving Chemotherapy  Today you received the following chemotherapy agents:  Herceptin, Perjeta, Taxotere, and Carboplatin.  To help prevent nausea and vomiting after your treatment, we encourage you to take your nausea medication as directed.   If you develop nausea and vomiting that is not controlled by your nausea medication, call the clinic.   BELOW ARE SYMPTOMS THAT SHOULD BE REPORTED IMMEDIATELY:  *FEVER GREATER THAN 100.5 F  *CHILLS WITH OR WITHOUT FEVER  NAUSEA AND VOMITING THAT IS NOT CONTROLLED WITH YOUR NAUSEA MEDICATION  *UNUSUAL SHORTNESS OF BREATH  *UNUSUAL BRUISING OR BLEEDING  TENDERNESS IN MOUTH AND THROAT WITH OR WITHOUT PRESENCE OF ULCERS  *URINARY PROBLEMS  *BOWEL PROBLEMS  UNUSUAL RASH Items with * indicate a potential emergency and should be followed up as soon as possible.  Feel free to call the clinic should you have any questions or concerns. The clinic phone number is [phone].  Please show the CHEMO ALERT CARD at check-in to the Emergency Department and triage nurse.   

## 2018-04-19 ENCOUNTER — Inpatient Hospital Stay: Payer: Managed Care, Other (non HMO)

## 2018-04-19 ENCOUNTER — Other Ambulatory Visit: Payer: Managed Care, Other (non HMO)

## 2018-04-19 VITALS — BP 131/61 | HR 84 | Temp 98.6°F | Resp 18

## 2018-04-19 DIAGNOSIS — Z5112 Encounter for antineoplastic immunotherapy: Secondary | ICD-10-CM | POA: Diagnosis not present

## 2018-04-19 DIAGNOSIS — Z171 Estrogen receptor negative status [ER-]: Principal | ICD-10-CM

## 2018-04-19 DIAGNOSIS — C50812 Malignant neoplasm of overlapping sites of left female breast: Secondary | ICD-10-CM

## 2018-04-19 MED ORDER — PEGFILGRASTIM-CBQV 6 MG/0.6ML ~~LOC~~ SOSY
6.0000 mg | PREFILLED_SYRINGE | Freq: Once | SUBCUTANEOUS | Status: AC
  Administered 2018-04-19: 6 mg via SUBCUTANEOUS

## 2018-04-20 MED FILL — DOTTI 0.05 MG/24HR PTTW: 0.05 | 28 days supply | Qty: 8 | Fill #0

## 2018-04-23 ENCOUNTER — Other Ambulatory Visit: Payer: Managed Care, Other (non HMO)

## 2018-04-23 ENCOUNTER — Ambulatory Visit: Payer: Managed Care, Other (non HMO)

## 2018-04-23 ENCOUNTER — Ambulatory Visit: Payer: Managed Care, Other (non HMO) | Admitting: Hematology and Oncology

## 2018-04-27 ENCOUNTER — Ambulatory Visit (INDEPENDENT_AMBULATORY_CARE_PROVIDER_SITE_OTHER): Payer: Managed Care, Other (non HMO) | Admitting: Bariatrics

## 2018-04-27 ENCOUNTER — Encounter (INDEPENDENT_AMBULATORY_CARE_PROVIDER_SITE_OTHER): Payer: Self-pay | Admitting: Bariatrics

## 2018-04-27 VITALS — BP 115/62 | HR 80 | Temp 98.3°F | Ht 65.0 in | Wt 178.0 lb

## 2018-04-27 DIAGNOSIS — E669 Obesity, unspecified: Secondary | ICD-10-CM

## 2018-04-27 DIAGNOSIS — K30 Functional dyspepsia: Secondary | ICD-10-CM

## 2018-04-27 DIAGNOSIS — Z171 Estrogen receptor negative status [ER-]: Secondary | ICD-10-CM

## 2018-04-27 DIAGNOSIS — C50812 Malignant neoplasm of overlapping sites of left female breast: Secondary | ICD-10-CM

## 2018-04-27 DIAGNOSIS — R7303 Prediabetes: Secondary | ICD-10-CM

## 2018-04-27 DIAGNOSIS — Z9189 Other specified personal risk factors, not elsewhere classified: Secondary | ICD-10-CM | POA: Diagnosis not present

## 2018-04-27 DIAGNOSIS — Z6830 Body mass index (BMI) 30.0-30.9, adult: Secondary | ICD-10-CM

## 2018-04-28 NOTE — Progress Notes (Signed)
Office: [phone]  /  Fax: [phone]   HPI:   Chief Complaint: OBESITY Christy Serrano is here to discuss her progress with her obesity treatment plan. She is on the Category 3 plan and is following her eating plan approximately 25 % of the time. She states she is doing stairs for 30 minutes 7 times per week. Christy Serrano is doing well overall. She is eating what she can eat. Her weight is 178 lb (80.7 kg) today and has had a weight loss of 2 pounds over a period of 2 weeks since her last visit. She has lost 17 lbs since starting treatment with [place].  Pre-Diabetes Christy Serrano has a diagnosis of prediabetes based on her elevated Hgb A1c and was informed this puts her at greater risk of developing diabetes. She is not taking metformin currently and continues to work on diet and exercise to decrease risk of diabetes. She denies polyphagia.  At risk for diabetes Christy Serrano is at higher than average risk for developing diabetes due to her obesity and prediabetes. She currently denies polyuria or polydipsia.  Left Breast Cancer Stage 3 Christy Serrano has had taste changes secondary to chemotherapy.  Indigestion Christy Serrano has a diagnosis of indigestion and she admits to stomach upsets.  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 She was informed that eating too many simple carbohydrates or too many calories at one sitting increases the likelihood of GI side effects. Christy Serrano agreed to follow up with [place] as directed to monitor  her progress.  Diabetes risk counseling Christy Serrano was given extended (15 minutes) diabetes prevention counseling today. She is 63 y.o. female and has risk factors for diabetes including obesity and prediabetes. We discussed intensive lifestyle modifications today with an emphasis on weight loss as well as increasing exercise and decreasing simple carbohydrates in her diet.  Left Breast Cancer Stage 3 Christy Serrano will follow up with oncology. We discussed taste changes secondary to chemotherapy. Christy Serrano will follow up with our clinic in 2 weeks.  Indigestion Christy Serrano will continue taking Protonix and she will take Tums. Christy Serrano will take Align, probiotics and prebiotic's. Christy Serrano agrees to follow up as directed.  Obesity Christy Serrano is currently in the action stage of change. As such, her goal is to continue with weight loss efforts She has agreed to follow the Category 3 plan Christy Serrano has been instructed to work up to a goal of 150 minutes of combined cardio and strengthening exercise per week for weight loss and overall health benefits. We discussed the following Behavioral Modification Strategies today: increase H2O intake, no skipping meals, increasing lean protein intake, decreasing simple carbohydrates, increasing vegetables and work on meal planning and easy cooking plans  Christy Serrano has agreed to follow up with our clinic in 2 weeks fasting. She was informed of the importance of frequent follow up visits to maximize her success with intensive lifestyle modifications for her multiple health conditions.  ALLERGIES: Allergies  Allergen Reactions  . Oxycodone-Acetaminophen Other (See Comments) and Palpitations    tachycardia   . Amoxicillin Rash  DID THE REACTION INVOLVE: Swelling of the face/tongue/throat, SOB, or low BP? No Sudden or severe rash/hives, skin peeling, or the inside of the mouth or nose? No Did it require medical treatment? No When did it last happen?in her 84's If  all above answers are "NO", may proceed with cephalosporin use.     MEDICATIONS: Current Outpatient Medications on File Prior to Visit  Medication Sig Dispense Refill  . acetaminophen (TYLENOL) 500 MG tablet Take 1,000 mg by mouth every 6 (six) hours as needed for mild pain.    Christy Serrano Kitchen albuterol (PROVENTIL HFA;VENTOLIN HFA) 108 (90 Base) MCG/ACT inhaler Inhale into the lungs every 6 (six) hours as needed for wheezing or shortness of breath.    . Calcium-Phosphorus-Vitamin D (CITRACAL +D3 PO) Take 1,200 mcg by mouth.    . cholecalciferol (VITAMIN D) 1000 units tablet Take 1,000 Units by mouth daily.    . cholestyramine (QUESTRAN) 4 g packet Take 1 packet (4 g total) by mouth 2 (two) times daily with a meal. 60 each 1  . dexamethasone (DECADRON) 4 MG tablet Take 1 tablet (4 mg total) by mouth daily. Take 1 tablet day before chemo and 1 tablet day after with food 12 tablet 0  . diphenoxylate-atropine (LOMOTIL) 2.5-0.025 MG tablet Take 1 tablet by mouth 4 (four) times daily as needed for diarrhea or loose stools. 30 tablet 3  . estradiol (VIVELLE-DOT) 0.075 MG/24HR Place 1 patch onto the skin 2 (two) times a week.     . lidocaine-prilocaine (EMLA) cream Apply to affected area once 30 g 3  . LORazepam (ATIVAN) 0.5 MG tablet Take 1 tablet (0.5 mg total) by mouth at bedtime as needed (Nausea or vomiting). 30 tablet 0  . Multiple Vitamin (MULTIVITAMIN) capsule Take 1 capsule by mouth daily.    . ondansetron (ZOFRAN) 8 MG tablet Take 1 tablet (8 mg total) by mouth 2 (two) times daily as needed (Nausea or vomiting). Begin 4 days after chemotherapy. 30 tablet 1  . pantoprazole (PROTONIX) 40 MG tablet Take 1 tablet (40 mg total) by mouth daily. 30 tablet 3  . potassium chloride SA (K-DUR,KLOR-CON) 20 MEQ tablet Take 1 tablet (20 mEq total) by mouth daily. 30 tablet 1  . prochlorperazine (COMPAZINE) 10 MG tablet Take 1 tablet (10 mg total) by mouth every 6 (six) hours as needed (Nausea or vomiting). 30 tablet 1    . traMADol (ULTRAM) 50 MG tablet Take 1-2 tablets (50-100 mg total) by mouth every 6 (six) hours as needed. 20 tablet 0   No current facility-administered medications on file prior to visit.     PAST MEDICAL HISTORY: Past Medical History:  Diagnosis Date  . Family history of breast cancer   . Family history of ovarian cancer   . Family history of prostate cancer   . Lactose intolerance   . PONV (postoperative nausea and vomiting)     PAST SURGICAL HISTORY: Past Surgical History:  Procedure Laterality Date  . ABDOMINAL HYSTERECTOMY    . PORTACATH PLACEMENT Right 03/03/2018   Procedure: INSERTION PORT-A-CATH;  Surgeon: Jovita Kussmaul, MD;  Location: [facility];  Service: General;  Laterality: Right;  . ROTATOR CUFF REPAIR    . TOTAL ABDOMINAL HYSTERECTOMY      SOCIAL HISTORY: Social History   Tobacco Use  . Smoking status: Never Smoker  . Smokeless tobacco: Never Used  Substance Use Topics  . Alcohol use: No  . Drug use: No    FAMILY HISTORY: Family History  Problem Relation Age of Onset  . High blood pressure Mother   . High blood pressure Father   . Prostate cancer Father        dx mid 78s  . Breast cancer Sister 29       negative Invitae 46 gene panel  . Breast cancer Paternal Aunt        dx mid 52s  . Cancer Paternal Uncle        jaw cancer d. 41s  . Ovarian cancer Maternal Grandmother        d. 10s  . Cancer Paternal Grandmother        either stomach or ovarian cancer  . Breast cancer Sister 46       "negative 21 gene panel"    ROS: Review of Systems  Constitutional: Positive for weight loss.  Gastrointestinal: Positive for heartburn and nausea.  Genitourinary: Negative for frequency.  Endo/Heme/Allergies: Negative for polydipsia.       Negative for polyphagia    PHYSICAL EXAM: Blood pressure 115/62, pulse 80, temperature 98.3 F (36.8 C), temperature source Oral, height 5\' 5"  (1.651 m), weight 178 lb (80.7 kg), SpO2 100 %. Body  mass index is 29.62 kg/m. Physical Exam Vitals signs reviewed.  Constitutional:      Appearance: Normal appearance. She is well-developed. She is obese.  Cardiovascular:     Rate and Rhythm: Normal rate.  Pulmonary:     Effort: Pulmonary effort is normal.  Musculoskeletal: Normal range of motion.  Skin:    General: Skin is warm and dry.  Neurological:     Mental Status: She is alert and oriented to person, place, and time.  Psychiatric:        Mood and Affect: Mood normal.        Behavior: Behavior normal.     RECENT LABS AND TESTS: BMET    Component Value Date/Time   NA 139 04/16/2018 0856   NA 138 12/22/2017 1235   K 4.4 04/16/2018 0856   CL 106 04/16/2018 0856   CO2 25 04/16/2018 0856   GLUCOSE 113 (H) 04/16/2018 0856   BUN 11 04/16/2018 0856   BUN 12 12/22/2017 1235   CREATININE 0.85 04/16/2018 0856   CREATININE 0.90 03/26/2018 0859   CALCIUM 9.3 04/16/2018 0856   GFRNONAA >60 04/16/2018 0856   GFRNONAA >60 03/26/2018 0859   GFRAA >60 04/16/2018 0856   GFRAA >60 03/26/2018 0859   Lab Results  Component Value Date   HGBA1C 6.1 (H) 12/22/2017   Lab Results  Component Value Date   INSULIN 13.7 12/22/2017   CBC    Component Value Date/Time   WBC 6.6 04/16/2018 0856   RBC 3.14 (L) 04/16/2018 0856   HGB 9.5 (L) 04/16/2018 0856   HGB 9.7 (L) 03/26/2018 0859   HGB 12.3 12/22/2017 1235   HCT 29.6 (L) 04/16/2018 0856   HCT 38.2 12/22/2017 1235   PLT 276 04/16/2018 0856   PLT 306 03/26/2018 0859   MCV 94.3 04/16/2018 0856   MCV 92 12/22/2017 1235   MCH 30.3 04/16/2018 0856   MCHC 32.1 04/16/2018 0856   RDW 17.5 (H) 04/16/2018 0856   RDW 14.8 12/22/2017 1235   LYMPHSABS 0.8 04/16/2018 0856   LYMPHSABS 2.0 12/22/2017 1235   MONOABS 0.1 04/16/2018 0856   EOSABS 0.0 04/16/2018 0856   EOSABS 0.0 12/22/2017 1235   BASOSABS 0.0 04/16/2018 0856   BASOSABS 0.0 12/22/2017 1235   Iron/TIBC/Ferritin/ %Sat No results found for: IRON, TIBC, FERRITIN,  IRONPCTSAT Lipid Panel     Component Value Date/Time   CHOL 176 12/22/2017 1235   TRIG 111 12/22/2017 1235   HDL 61 12/22/2017 1235   LDLCALC 93 12/22/2017 1235   Hepatic Function Panel     Component Value Date/Time   PROT 7.0 04/16/2018 0856   PROT 7.6 12/22/2017 1235   ALBUMIN 3.9 04/16/2018 0856   ALBUMIN 4.8 12/22/2017 1235   AST 33 04/16/2018 0856   AST 20 03/26/2018 0859   ALT 32 04/16/2018 0856   ALT 19 03/26/2018 0859   ALKPHOS 95 04/16/2018 0856   BILITOT 0.5 04/16/2018 0856   BILITOT 0.7 03/26/2018 0859      Component Value Date/Time   TSH 3.644 03/10/2018 0538   TSH 1.930 12/22/2017 1235     Ref. Range 12/22/2017 12:35  Vitamin D, 25-Hydroxy Latest Ref Range: 30.0 - 100.0 ng/mL 41.9     OBESITY BEHAVIORAL INTERVENTION VISIT  Today's visit was # 8   Starting weight: 195 lbs Starting date: 12/22/2017 Today's weight : 178 lbs Today's date: 04/27/2018 Total lbs lost to date: 17   ASK: We discussed the diagnosis of obesity with Christy Serrano today and Christy Serrano agreed to give [place] permission to discuss obesity behavioral modification therapy today.  ASSESS: Christy Serrano has the diagnosis of obesity and her BMI today is 29.62 Christy Serrano is in the action stage of change   ADVISE: Christy Serrano was educated on the multiple health risks of obesity as well as the benefit of weight loss to improve her health. She was advised of the need for long term treatment and the importance of lifestyle modifications to improve her current health and to decrease her risk of future health problems.  AGREE: Multiple dietary modification options and treatment options were discussed and  Christy Serrano agreed to follow the recommendations documented in the above note.  ARRANGE: Christy Serrano was educated on the importance of frequent visits to treat obesity as outlined per CMS and USPSTF guidelines and agreed to schedule her next follow up appointment today.  Christy Serrano, am acting as  Location manager for [employer]. Owens Shark, DO  I have reviewed the above documentation for accuracy and completeness, and I agree with the above. -Jearld Lesch, DO

## 2018-04-29 ENCOUNTER — Encounter (INDEPENDENT_AMBULATORY_CARE_PROVIDER_SITE_OTHER): Payer: Self-pay | Admitting: Bariatrics

## 2018-04-30 ENCOUNTER — Other Ambulatory Visit: Payer: Managed Care, Other (non HMO)

## 2018-04-30 ENCOUNTER — Ambulatory Visit: Payer: Managed Care, Other (non HMO)

## 2018-04-30 ENCOUNTER — Ambulatory Visit: Payer: Managed Care, Other (non HMO) | Admitting: Adult Health

## 2018-05-03 MED FILL — DIPHENOXYLATE/ATROPINE TAB: 2.5-0.025 | 7 days supply | Qty: 30 | Fill #2

## 2018-05-07 ENCOUNTER — Inpatient Hospital Stay: Payer: Managed Care, Other (non HMO)

## 2018-05-07 ENCOUNTER — Encounter: Payer: Self-pay | Admitting: Adult Health

## 2018-05-07 ENCOUNTER — Encounter: Payer: Self-pay | Admitting: *Deleted

## 2018-05-07 ENCOUNTER — Inpatient Hospital Stay: Payer: Managed Care, Other (non HMO) | Attending: Hematology and Oncology | Admitting: Adult Health

## 2018-05-07 VITALS — BP 115/59 | HR 83 | Temp 98.9°F | Resp 18 | Ht 65.0 in | Wt 183.1 lb

## 2018-05-07 DIAGNOSIS — Z171 Estrogen receptor negative status [ER-]: Principal | ICD-10-CM

## 2018-05-07 DIAGNOSIS — Z79899 Other long term (current) drug therapy: Secondary | ICD-10-CM

## 2018-05-07 DIAGNOSIS — C50812 Malignant neoplasm of overlapping sites of left female breast: Secondary | ICD-10-CM

## 2018-05-07 DIAGNOSIS — C50412 Malignant neoplasm of upper-outer quadrant of left female breast: Secondary | ICD-10-CM

## 2018-05-07 DIAGNOSIS — Z5111 Encounter for antineoplastic chemotherapy: Secondary | ICD-10-CM | POA: Diagnosis not present

## 2018-05-07 DIAGNOSIS — Z95828 Presence of other vascular implants and grafts: Secondary | ICD-10-CM

## 2018-05-07 DIAGNOSIS — Z7689 Persons encountering health services in other specified circumstances: Secondary | ICD-10-CM | POA: Insufficient documentation

## 2018-05-07 DIAGNOSIS — M7989 Other specified soft tissue disorders: Secondary | ICD-10-CM

## 2018-05-07 DIAGNOSIS — C773 Secondary and unspecified malignant neoplasm of axilla and upper limb lymph nodes: Secondary | ICD-10-CM

## 2018-05-07 LAB — COMPREHENSIVE METABOLIC PANEL
ALT: 37 U/L (ref 0–44)
AST: 53 U/L — ABNORMAL HIGH (ref 15–41)
Albumin: 3.7 g/dL (ref 3.5–5.0)
Alkaline Phosphatase: 94 U/L (ref 38–126)
Anion gap: 9 (ref 5–15)
BUN: 14 mg/dL (ref 6–20)
CO2: 23 mmol/L (ref 22–32)
Calcium: 9.2 mg/dL (ref 8.9–10.3)
Chloride: 106 mmol/L (ref 98–111)
Creatinine, Ser: 0.88 mg/dL (ref 0.44–1.00)
GFR calc Af Amer: >60 mL/min (ref >60)
GFR calc non Af Amer: >60 mL/min (ref >60)
Glucose, Bld: 115 mg/dL — ABNORMAL HIGH (ref 70–99)
POTASSIUM: 4.5 mmol/L (ref 3.5–5.1)
Sodium: 138 mmol/L (ref 135–145)
Total Bilirubin: 0.6 mg/dL (ref 0.3–1.2)
Total Protein: 6.8 g/dL (ref 6.5–8.1)

## 2018-05-07 LAB — CBC WITH DIFFERENTIAL/PLATELET
ABS IMMATURE GRANULOCYTES: 0.03 10*3/uL (ref 0.00–0.07)
BASOS PCT: 0 %
Basophils Absolute: 0 10*3/uL (ref 0.0–0.1)
Eosinophils Absolute: 0 10*3/uL (ref 0.0–0.5)
Eosinophils Relative: 0 %
HCT: 27.6 % — ABNORMAL LOW (ref 36.0–46.0)
Hemoglobin: 8.8 g/dL — ABNORMAL LOW (ref 12.0–15.0)
Immature Granulocytes: 1 %
Lymphocytes Relative: 23 %
Lymphs Abs: 1.1 10*3/uL (ref 0.7–4.0)
MCH: 30.3 pg (ref 26.0–34.0)
MCHC: 31.9 g/dL (ref 30.0–36.0)
MCV: 95.2 fL (ref 80.0–100.0)
Monocytes Absolute: 0.1 10*3/uL (ref 0.1–1.0)
Monocytes Relative: 2 %
Neutro Abs: 3.5 10*3/uL (ref 1.7–7.7)
Neutrophils Relative %: 74 %
PLATELETS: 206 10*3/uL (ref 150–400)
RBC: 2.9 MIL/uL — ABNORMAL LOW (ref 3.87–5.11)
RDW: 18.8 % — ABNORMAL HIGH (ref 11.5–15.5)
WBC: 4.8 10*3/uL (ref 4.0–10.5)
nRBC: 0 % (ref 0.0–0.2)

## 2018-05-07 MED ORDER — SODIUM CHLORIDE 0.9 % IV SOLN
700.0000 mg | Freq: Once | INTRAVENOUS | Status: AC
  Administered 2018-05-07: 700 mg via INTRAVENOUS
  Filled 2018-05-07: qty 70

## 2018-05-07 MED ORDER — HEPARIN SOD (PORK) LOCK FLUSH 100 UNIT/ML IV SOLN
500.0000 [IU] | Freq: Once | INTRAVENOUS | Status: AC | PRN
  Administered 2018-05-07: 500 [IU]
  Filled 2018-05-07: qty 5

## 2018-05-07 MED ORDER — DIPHENHYDRAMINE HCL 25 MG PO CAPS
ORAL_CAPSULE | ORAL | Status: AC
  Filled 2018-05-07: qty 2

## 2018-05-07 MED ORDER — SODIUM CHLORIDE 0.9 % IV SOLN
Freq: Once | INTRAVENOUS | Status: AC
  Administered 2018-05-07: 11:00:00 via INTRAVENOUS
  Filled 2018-05-07: qty 5

## 2018-05-07 MED ORDER — ACETAMINOPHEN 325 MG PO TABS
ORAL_TABLET | ORAL | Status: AC
  Filled 2018-05-07: qty 2

## 2018-05-07 MED ORDER — PALONOSETRON HCL INJECTION 0.25 MG/5ML
INTRAVENOUS | Status: AC
  Filled 2018-05-07: qty 5

## 2018-05-07 MED ORDER — SODIUM CHLORIDE 0.9 % IV SOLN
Freq: Once | INTRAVENOUS | Status: AC
  Administered 2018-05-07: 11:00:00 via INTRAVENOUS
  Filled 2018-05-07: qty 250

## 2018-05-07 MED ORDER — PALONOSETRON HCL INJECTION 0.25 MG/5ML
0.2500 mg | Freq: Once | INTRAVENOUS | Status: AC
  Administered 2018-05-07: 0.25 mg via INTRAVENOUS

## 2018-05-07 MED ORDER — SODIUM CHLORIDE 0.9 % IV SOLN
75.0000 mg/m2 | Freq: Once | INTRAVENOUS | Status: AC
  Administered 2018-05-07: 150 mg via INTRAVENOUS
  Filled 2018-05-07: qty 15

## 2018-05-07 MED ORDER — ACETAMINOPHEN 325 MG PO TABS
650.0000 mg | ORAL_TABLET | Freq: Once | ORAL | Status: AC
  Administered 2018-05-07: 650 mg via ORAL

## 2018-05-07 MED ORDER — DIPHENHYDRAMINE HCL 25 MG PO CAPS
50.0000 mg | ORAL_CAPSULE | Freq: Once | ORAL | Status: AC
  Administered 2018-05-07: 50 mg via ORAL

## 2018-05-07 MED ORDER — SODIUM CHLORIDE 0.9 % IV SOLN
420.0000 mg | Freq: Once | INTRAVENOUS | Status: AC
  Administered 2018-05-07: 420 mg via INTRAVENOUS
  Filled 2018-05-07: qty 14

## 2018-05-07 MED ORDER — SODIUM CHLORIDE 0.9% FLUSH
10.0000 mL | INTRAVENOUS | Status: DC | PRN
  Administered 2018-05-07: 10 mL
  Filled 2018-05-07: qty 10

## 2018-05-07 MED ORDER — TRASTUZUMAB CHEMO 150 MG IV SOLR
6.0000 mg/kg | Freq: Once | INTRAVENOUS | Status: AC
  Administered 2018-05-07: 525 mg via INTRAVENOUS
  Filled 2018-05-07: qty 25

## 2018-05-07 NOTE — Progress Notes (Signed)
[facility] Follow up:    Christy Serrano, [place] [address]   DIAGNOSIS: Cancer Staging Malignant neoplasm of left female breast [facility]) Staging form: Breast, AJCC 8th Edition - Clinical: Stage IIIA (cT3, cN1, cM0, G3, ER-, PR-, HER2+) - Signed by Nicholas Lose, MD on 02/03/2018   SUMMARY OF ONCOLOGIC HISTORY:   Malignant neoplasm of left female breast ([place])   01/18/2018 Initial Diagnosis    Screening mammogram detected microcalcifications and asymmetry, left breast segmental microcalcifications UOQ 1.5 x 4.3 x 5.4 cm biopsy-proven IDC with DCIS with LV I, grade 3, ER 0%, PR 0%, Ki-67 30%, HER-2 +3+ by IHC; hypoechoic mass 2 o'clock position 1.2 x 1.3 x 1.7 cm biopsy IDC, LV I present, grade 2-3, ER 0%, PR 0%, Ki-67 40%, HER-2 +3+, left axillary lymph node positive (3 nodes noted by [place]) T3 N1 stage IIIA    02/03/2018 Cancer Staging    Staging form: Breast, AJCC 8th Edition - Clinical: Stage IIIA (cT3, cN1, cM0, G3, ER-, PR-, HER2+) - Signed by Nicholas Lose, MD on 02/03/2018    02/20/2018 Genetic Testing    No pathogenic variants identified on the [facility] CancerNext Expanded + RNA insight panel. Genes Analyzed (67 total): AIP, ALK, APC*, ATM*, BAP1, BARD1, BLM, BMPR1A, BRCA1*, BRCA2*, BRIP1*, CDH1*, CDK4, CDKN1B, CDKN2A, CHEK2*, DICER1, FANCC, FH, FLCN, GALNT12, HOXB13, MAX, MEN1, MET, MLH1*, MRE11A, MSH2*, MSH6*, MUTYH*, NBN, NF1*, NF2, PALB2*, PHOX2B, PMS2*, POLD1, POLE, POT1, PRKAR1A, PTCH1, PTEN*, RAD50, RAD51C*, RAD51D*, RB1, RET, SDHA, SDHAF2, SDHB, SDHC, SDHD, SMAD4, SMARCA4, SMARCB1, SMARCE1, STK11, SUFU, TMEM127, TP53*, TSC1, TSC2, VHL and XRCC2 (sequencing and deletion/duplication); MITF (sequencing only); EPCAM and GREM1 (deletion/duplication only). DNA and RNA analyses performed for * genes. The report date is 02/20/2018.    03/04/2018 -  Neo-Adjuvant Chemotherapy    Neoadjuvant chemotherapy with [facility] Perjeta    03/08/2018 - 03/10/2018  Hospital Admission    Admitted for recurrent syncope with loss of consciousness for up to 5 minutes with loss of bladder, no abnormalities identified.  MRI brain, carotid ultrasound, EEG studies were negative.     CURRENT THERAPY: [facility] Perjeta  INTERVAL HISTORY: Christy Serrano 63 y.o. female returns for evaluation prior to receiving her fourth cycle of neoadjuvant TCH Perjeta.  She is doing moderately well today.  She has noted an increase in swelling in her lower extremities, right greater than left over the past couple of weeks.  She denies peripheral neuropathy.  Her mom unexpectedly fell this morning and was unconscious.  [facility] and is undergoing extensive testing.  She isn't sure if she can stay for treatment today and doesn't know what to do.     Patient Active Problem List   Diagnosis Date Noted  . Seizure-like activity ([place])   . Hyponatremia   . Recurrent syncope 03/08/2018  . LFT elevation   . Malignant neoplasm of left female breast ([place])   . Seizure ([place])   . Thrombocytopenia ([place])   . Port-A-Cath in place 03/04/2018  . Genetic testing 02/23/2018  . Family history of breast cancer   . Family history of prostate cancer   . Family history of ovarian cancer   . Malignant neoplasm of left female breast ([place]) 02/03/2018  . Prediabetes 01/06/2018  . Reactive airway disease without complication 63/87/5643  . Class 1 obesity with serious comorbidity and body mass index (BMI) of 32.0 to 32.9 in adult 12/22/2017    is  allergic to oxycodone-acetaminophen and amoxicillin.  MEDICAL HISTORY: Past Medical History:  Diagnosis Date  . Family history of breast cancer   . Family history of ovarian cancer   . Family history of prostate cancer   . Lactose intolerance   . PONV (postoperative nausea and vomiting)     SURGICAL HISTORY: Past Surgical History:  Procedure Laterality Date  . ABDOMINAL HYSTERECTOMY    . PORTACATH PLACEMENT Right  03/03/2018   Procedure: INSERTION PORT-A-CATH;  Surgeon: Jovita Kussmaul, MD;  Location: [facility];  Service: General;  Laterality: Right;  . ROTATOR CUFF REPAIR    . TOTAL ABDOMINAL HYSTERECTOMY      SOCIAL HISTORY: Social History   Socioeconomic History  . Marital status: Single    Spouse name: Not on file  . Number of children: Not on file  . Years of education: Not on file  . Highest education level: Not on file  Occupational History  . Occupation: Quarry manager  Social Needs  . Financial resource strain: Not on file  . Food insecurity:    Worry: Not on file    Inability: Not on file  . Transportation needs:    Medical: No    Non-medical: No  Tobacco Use  . Smoking status: Never Smoker  . Smokeless tobacco: Never Used  Substance and Sexual Activity  . Alcohol use: No  . Drug use: No  . Sexual activity: Not on file  Lifestyle  . Physical activity:    Days per week: Not on file    Minutes per session: Not on file  . Stress: Not on file  Relationships  . Social connections:    Talks on phone: Not on file    Gets together: Not on file    Attends religious service: Not on file    Active member of club or organization: Not on file    Attends meetings of clubs or organizations: Not on file    Relationship status: Not on file  . Intimate partner violence:    Fear of current or ex partner: No    Emotionally abused: No    Physically abused: No    Forced sexual activity: No  Other Topics Concern  . Not on file  Social History Narrative  . Not on file    FAMILY HISTORY: Family History  Problem Relation Age of Onset  . High blood pressure Mother   . High blood pressure Father   . Prostate cancer Father        dx mid 73s  . Breast cancer Sister 37       negative Invitae 46 gene panel  . Breast cancer Paternal Aunt        dx mid 69s  . Cancer Paternal Uncle        jaw cancer d. 19s  . Ovarian cancer Maternal Grandmother        d. 50s  . Cancer  Paternal Grandmother        either stomach or ovarian cancer  . Breast cancer Sister 76       "negative 21 gene panel"    Review of Systems  Constitutional: Negative for appetite change, chills, fatigue, fever and unexpected weight change.  HENT:   Negative for hearing loss, lump/mass and mouth sores.   Eyes: Negative for eye problems and icterus.  Respiratory: Negative for chest tightness, cough and shortness of breath.   Cardiovascular: Negative for chest pain, leg swelling and palpitations.  Gastrointestinal: Negative  for abdominal distention, abdominal pain, constipation, diarrhea, nausea and vomiting.  Endocrine: Negative for hot flashes.  Genitourinary: Negative for difficulty urinating.   Musculoskeletal: Negative for arthralgias.  Skin: Negative for itching and rash.  Neurological: Negative for dizziness, extremity weakness, headaches and numbness.  Hematological: Negative for adenopathy. Does not bruise/bleed easily.  Psychiatric/Behavioral: Negative for depression. The patient is not nervous/anxious.       PHYSICAL EXAMINATION  ECOG PERFORMANCE STATUS: 1 - Symptomatic but completely ambulatory  Vitals:   05/07/18 0935  BP: (!) 115/59  Pulse: 83  Resp: 18  Temp: 98.9 F (37.2 C)  SpO2: 100%    Physical Exam Constitutional:      General: She is not in acute distress.    Appearance: Normal appearance.  HENT:     Head: Normocephalic and atraumatic.     Mouth/Throat:     Mouth: Mucous membranes are moist.     Pharynx: Oropharynx is clear. No oropharyngeal exudate or posterior oropharyngeal erythema.  Eyes:     General: No scleral icterus.    Pupils: Pupils are equal, round, and reactive to light.  Neck:     Musculoskeletal: Neck supple.  Cardiovascular:     Rate and Rhythm: Normal rate and regular rhythm.     Pulses: Normal pulses.     Heart sounds: Normal heart sounds.  Pulmonary:     Effort: Pulmonary effort is normal.     Breath sounds: Normal breath  sounds.     Comments: Left breast with a small palpable abnormality, ? Scar tissue from biopsy versus nodularity Abdominal:     General: Abdomen is flat. There is no distension.     Palpations: Abdomen is soft.     Tenderness: There is no abdominal tenderness.  Musculoskeletal:        General: No swelling.  Skin:    General: Skin is warm and dry.     Capillary Refill: Capillary refill takes less than 2 seconds.     Findings: No rash.  Neurological:     General: No focal deficit present.     Mental Status: She is alert.  Psychiatric:        Mood and Affect: Mood normal.     LABORATORY DATA:  CBC    Component Value Date/Time   WBC 4.8 05/07/2018 0903   RBC 2.90 (L) 05/07/2018 0903   HGB 8.8 (L) 05/07/2018 0903   HGB 9.7 (L) 03/26/2018 0859   HGB 12.3 12/22/2017 1235   HCT 27.6 (L) 05/07/2018 0903   HCT 38.2 12/22/2017 1235   PLT 206 05/07/2018 0903   PLT 306 03/26/2018 0859   MCV 95.2 05/07/2018 0903   MCV 92 12/22/2017 1235   MCH 30.3 05/07/2018 0903   MCHC 31.9 05/07/2018 0903   RDW 18.8 (H) 05/07/2018 0903   RDW 14.8 12/22/2017 1235   LYMPHSABS 1.1 05/07/2018 0903   LYMPHSABS 2.0 12/22/2017 1235   MONOABS 0.1 05/07/2018 0903   EOSABS 0.0 05/07/2018 0903   EOSABS 0.0 12/22/2017 1235   BASOSABS 0.0 05/07/2018 0903   BASOSABS 0.0 12/22/2017 1235    CMP     Component Value Date/Time   NA 138 05/07/2018 0903   NA 138 12/22/2017 1235   K 4.5 05/07/2018 0903   CL 106 05/07/2018 0903   CO2 23 05/07/2018 0903   GLUCOSE 115 (H) 05/07/2018 0903   BUN 14 05/07/2018 0903   BUN 12 12/22/2017 1235   CREATININE 0.88 05/07/2018 0903   CREATININE 0.90  03/26/2018 0859   CALCIUM 9.2 05/07/2018 0903   PROT 6.8 05/07/2018 0903   PROT 7.6 12/22/2017 1235   ALBUMIN 3.7 05/07/2018 0903   ALBUMIN 4.8 12/22/2017 1235   AST 53 (H) 05/07/2018 0903   AST 20 03/26/2018 0859   ALT 37 05/07/2018 0903   ALT 19 03/26/2018 0859   ALKPHOS 94 05/07/2018 0903   BILITOT 0.6  05/07/2018 0903   BILITOT 0.7 03/26/2018 0859   GFRNONAA >60 05/07/2018 0903   GFRNONAA >60 03/26/2018 0859   GFRAA >60 05/07/2018 0903   GFRAA >60 03/26/2018 0859           ASSESSMENT and THERAPY PLAN:   Malignant neoplasm of left female breast ([place]) 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, grade 3, ER 0%, PR 0%, Ki-67 30%, HER-2 +3+ by IHC; hypoechoic mass 2 o'clock position 1.2 x 1.3 x 1.7 cm biopsy IDC, LV I present, grade 2-3, ER 0%, PR 0%, Ki-67 40%, HER-2 +3+, left axillary lymph node positive (3 nodes noted by [place]) T3 N1 stage IIIA  Treatment plan 1.  Neoadjuvant chemotherapy with [place] followed by Herceptin Perjeta maintenance versus Kadcyla maintenance 2. possible mastectomy with targeted node dissection 3.  Adjuvant radiation therapy --------------------------------------------------------------------------------------------------------------------------------------------------- Current treatment: Cycle 4 day 1 TCH Perjeta  Echo 12/10 shows LVEF of 60-65%  Chemo toxicities: 1.  Recurrent syncope requiring hospitalization: Unclear etiology, no further issues, still completing work up. 2.  Diarrhea: Imodium, Lomotil, Questran.  She is managing this.    3. Nail dyscrasia: No brittleness at this point, recommended she keep nails trim and moisturized. 4. Scalp Discomfort: slightly improved. 5. Lower extremity swelling: doppler ordered  I talked to her in detail about the situation with her mom. I offered to reschedule her appointments today to early next week and adjust her treatments thereafter.  However, after discussion she opted to at least get Herceptin/Perjeta today, and if she doesn't need to go to the hospital she will receive the Taxotere and Carboplatin.  I called and spoke to the Surveyor, quantity of nursing, Threasa Beards, and  reviewed the issues going on so they would be aware.  They know to call me if she cannot get all of her treatment.     Return to clinic in 3 weeks for cycle 5.      All questions were answered. The patient knows to call the clinic with any problems, questions or concerns. We can certainly see the patient much sooner if necessary.  A total of (30) minutes of face-to-face time was spent with this patient with greater than 50% of that time in counseling and care-coordination.  This note was electronically signed. Scot Dock, NP 05/07/2018

## 2018-05-07 NOTE — Patient Instructions (Signed)
[facility] Discharge Instructions for Patients Receiving Chemotherapy  Today you received the following chemotherapy agents:  Herceptin, Perjeta, Taxotere, and Carboplatin.  To help prevent nausea and vomiting after your treatment, we encourage you to take your nausea medication as directed.   If you develop nausea and vomiting that is not controlled by your nausea medication, call the clinic.   BELOW ARE SYMPTOMS THAT SHOULD BE REPORTED IMMEDIATELY:  *FEVER GREATER THAN 100.5 F  *CHILLS WITH OR WITHOUT FEVER  NAUSEA AND VOMITING THAT IS NOT CONTROLLED WITH YOUR NAUSEA MEDICATION  *UNUSUAL SHORTNESS OF BREATH  *UNUSUAL BRUISING OR BLEEDING  TENDERNESS IN MOUTH AND THROAT WITH OR WITHOUT PRESENCE OF ULCERS  *URINARY PROBLEMS  *BOWEL PROBLEMS  UNUSUAL RASH Items with * indicate a potential emergency and should be followed up as soon as possible.  Feel free to call the clinic should you have any questions or concerns. The clinic phone number is [phone].  Please show the CHEMO ALERT CARD at check-in to the Emergency Department and triage nurse.   

## 2018-05-07 NOTE — Assessment & Plan Note (Addendum)
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  Neoadjuvant chemotherapy with Murphy followed by Herceptin Perjeta maintenance versus Kadcyla maintenance 2. possible mastectomy with targeted node dissection 3.  Adjuvant radiation therapy --------------------------------------------------------------------------------------------------------------------------------------------------- Current treatment: Cycle 4 day 1 TCH Perjeta  Echo 12/10 shows LVEF of 60-65%  Chemo toxicities: 1.  Recurrent syncope requiring hospitalization: Unclear etiology, no further issues, still completing work up. 2.  Diarrhea: Imodium, Lomotil, Questran.  She is managing this.    3. Nail dyscrasia: No brittleness at this point, recommended she keep nails trim and moisturized. 4. Scalp Discomfort: slightly improved. 5. Lower extremity swelling: doppler ordered  I talked to her in detail about the situation with her mom. I offered to reschedule her appointments today to early next week and adjust her treatments thereafter.  However, after discussion she opted to at least get Herceptin/Perjeta today, and if she doesn't need to go to the hospital she will receive the Taxotere and Carboplatin.  I called and spoke to the Surveyor, quantity of nursing, Threasa Beards, and reviewed the issues going on so they would be aware.  They know to call me if she cannot get all of her treatment.     Return to clinic in 3 weeks for cycle 5.

## 2018-05-10 ENCOUNTER — Ambulatory Visit (HOSPITAL_COMMUNITY)
Admission: RE | Admit: 2018-05-10 | Discharge: 2018-05-10 | Disposition: A | Discharge status: Home / Self Care | Payer: Managed Care, Other (non HMO) | Source: Ambulatory Visit | Attending: Adult Health | Admitting: Adult Health

## 2018-05-10 ENCOUNTER — Inpatient Hospital Stay: Payer: Managed Care, Other (non HMO)

## 2018-05-10 VITALS — BP 117/53 | HR 83 | Temp 98.3°F | Resp 18

## 2018-05-10 DIAGNOSIS — Z171 Estrogen receptor negative status [ER-]: Secondary | ICD-10-CM

## 2018-05-10 DIAGNOSIS — C50812 Malignant neoplasm of overlapping sites of left female breast: Secondary | ICD-10-CM | POA: Diagnosis not present

## 2018-05-10 DIAGNOSIS — M7989 Other specified soft tissue disorders: Secondary | ICD-10-CM | POA: Diagnosis not present

## 2018-05-10 DIAGNOSIS — C50412 Malignant neoplasm of upper-outer quadrant of left female breast: Secondary | ICD-10-CM | POA: Diagnosis not present

## 2018-05-10 MED ORDER — PEGFILGRASTIM-CBQV 6 MG/0.6ML ~~LOC~~ SOSY
PREFILLED_SYRINGE | SUBCUTANEOUS | Status: AC
  Filled 2018-05-10: qty 0.6

## 2018-05-10 MED ORDER — PEGFILGRASTIM-CBQV 6 MG/0.6ML ~~LOC~~ SOSY
6.0000 mg | PREFILLED_SYRINGE | Freq: Once | SUBCUTANEOUS | Status: AC
  Administered 2018-05-10: 6 mg via SUBCUTANEOUS

## 2018-05-10 MED FILL — PANTOPRAZOLE SOD DR 40 MG T: 40 | 30 days supply | Qty: 30 | Fill #1 | Status: TO

## 2018-05-10 NOTE — Progress Notes (Signed)
Bilateral lower extremity venous duplex completed. Refer to "CV Proc" under chart review to view preliminary results.  05/10/2018 2:10 PM Maudry Mayhew, MHA, RVT, RDCS, RDMS

## 2018-05-11 ENCOUNTER — Encounter

## 2018-05-11 ENCOUNTER — Encounter: Payer: Self-pay | Admitting: Diagnostic Neuroimaging

## 2018-05-11 ENCOUNTER — Ambulatory Visit: Payer: Managed Care, Other (non HMO) | Admitting: Diagnostic Neuroimaging

## 2018-05-11 VITALS — BP 129/64 | HR 88 | Ht 65.0 in | Wt 183.4 lb

## 2018-05-11 DIAGNOSIS — R55 Syncope and collapse: Secondary | ICD-10-CM | POA: Diagnosis not present

## 2018-05-11 NOTE — Progress Notes (Signed)
[facility]  PATIENT: Christy Serrano DOB: 1955-05-25  REFERRING CLINICIAN: [facility]  HISTORY FROM: patient  REASON FOR VISIT: new consult    HISTORICAL  CHIEF COMPLAINT:  Chief Complaint  Patient presents with  . Syncope    rm 7, New Pt, sister- Dianne,  hospital FU, "2 events, 1st one after chemo, stayed in hospital x 3 days after 2nd one-nothing found"    HISTORY OF PRESENT ILLNESS:   63 year old female here for evaluation of syncope versus seizure.  History of breast cancer.  03/07/2018 patient was eating breakfast at home when she felt lightheaded and stared, slumped, had some twitching movements in her arms.  This lasted for a few minutes and then patient regained consciousness.  No violent convulsions, tongue biting or incontinence.  EMS was called to the home, patient went to the hospital, diagnosed with hypokalemia dehydration and sent home.  Of note patient had to start chemotherapy a few days prior.  The next day on 03/08/2018, patient had another similar attack.  She remembers the room going dark.  Patient return the hospital for evaluation.  She was admitted and had a seizure and syncope work-up.  Patient was diagnosed with probable convulsive syncope and discharged home.  MRI of the brain and EEG were unremarkable.  No family history of seizure.  No personal history of seizure herself in the past.   REVIEW OF SYSTEMS: Full 14 system review of systems performed and negative with exception of: As per HPI.  ALLERGIES: Allergies  Allergen Reactions  . Oxycodone-Acetaminophen Other (See Comments) and Palpitations    tachycardia   . Amoxicillin Rash    DID THE REACTION INVOLVE: Swelling of the face/tongue/throat, SOB, or low BP? No Sudden or severe rash/hives, skin peeling, or the inside of the mouth or nose? No Did it require medical treatment? No When did it last happen?in her 65's If all above answers are "NO", may proceed with  cephalosporin use.     HOME MEDICATIONS: Outpatient Medications Prior to Visit  Medication Sig Dispense Refill  . acetaminophen (TYLENOL) 500 MG tablet Take 1,000 mg by mouth every 6 (six) hours as needed for mild pain.    Marland Kitchen albuterol (PROVENTIL HFA;VENTOLIN HFA) 108 (90 Base) MCG/ACT inhaler Inhale into the lungs every 6 (six) hours as needed for wheezing or shortness of breath.    . Calcium-Phosphorus-Vitamin D (CITRACAL +D3 PO) Take 1,200 mcg by mouth.    . cholecalciferol (VITAMIN D) 1000 units tablet Take 1,000 Units by mouth daily.    . cholestyramine (QUESTRAN) 4 g packet Take 1 packet (4 g total) by mouth 2 (two) times daily with a meal. 60 each 1  . dexamethasone (DECADRON) 4 MG tablet Take 1 tablet (4 mg total) by mouth daily. Take 1 tablet day before chemo and 1 tablet day after with food 12 tablet 0  . diphenoxylate-atropine (LOMOTIL) 2.5-0.025 MG tablet Take 1 tablet by mouth 4 (four) times daily as needed for diarrhea or loose stools. 30 tablet 3  . estradiol (VIVELLE-DOT) 0.05 MG/24HR patch     . lidocaine-prilocaine (EMLA) cream Apply to affected area once 30 g 3  . LORazepam (ATIVAN) 0.5 MG tablet Take 1 tablet (0.5 mg total) by mouth at bedtime as needed (Nausea or vomiting). 30 tablet 0  . Multiple Vitamin (MULTIVITAMIN) capsule Take 1 capsule by mouth daily.    . ondansetron (ZOFRAN) 8 MG tablet Take 1 tablet (8 mg total) by mouth 2 (two) times daily  as needed (Nausea or vomiting). Begin 4 days after chemotherapy. 30 tablet 1  . pantoprazole (PROTONIX) 40 MG tablet Take 1 tablet (40 mg total) by mouth daily. 30 tablet 3  . potassium chloride SA (K-DUR,KLOR-CON) 20 MEQ tablet Take 1 tablet (20 mEq total) by mouth daily. 30 tablet 1  . prochlorperazine (COMPAZINE) 10 MG tablet Take 1 tablet (10 mg total) by mouth every 6 (six) hours as needed (Nausea or vomiting). 30 tablet 1  . traMADol (ULTRAM) 50 MG tablet Take 1-2 tablets (50-100 mg total) by mouth every 6 (six) hours as  needed. 20 tablet 0   No facility-administered medications prior to visit.     PAST MEDICAL HISTORY: Past Medical History:  Diagnosis Date  . Cancer [facility])    left breast  . Family history of breast cancer   . Family history of ovarian cancer   . Family history of prostate cancer   . Lactose intolerance   . PONV (postoperative nausea and vomiting)     PAST SURGICAL HISTORY: Past Surgical History:  Procedure Laterality Date  . PORTACATH PLACEMENT Right 03/03/2018   Procedure: INSERTION PORT-A-CATH;  Surgeon: Jovita Kussmaul, MD;  Location: [place];  Service: General;  Laterality: Right;  . ROTATOR CUFF REPAIR Right 2006  . TOTAL ABDOMINAL HYSTERECTOMY  2010    FAMILY HISTORY: Family History  Problem Relation Age of Onset  . High blood pressure Mother   . Heart attack Mother        CHF  . High blood pressure Father   . Prostate cancer Father        dx mid 61s  . Breast cancer Sister 77       negative [employer]ae 46 gene panel  . Breast cancer Paternal Aunt        dx mid 47s  . Cancer Paternal Uncle        jaw cancer d. 61s  . Ovarian cancer Maternal Grandmother        d. 65s  . Cancer Paternal Grandmother        either stomach or ovarian cancer  . Breast cancer Sister 32       "negative 21 gene panel"    SOCIAL HISTORY: Social History   Socioeconomic History  . Marital status: Single    Spouse name: Not on file  . Number of children: 0  . Years of education: 58  . Highest education level: Not on file  Occupational History  . Occupation: Quarry manager    Comment: [employer]  . Financial resource strain: Not on file  . Food insecurity:    Worry: Not on file    Inability: Not on file  . Transportation needs:    Medical: No    Non-medical: No  Tobacco Use  . Smoking status: Never Smoker  . Smokeless tobacco: Never Used  Substance and Sexual Activity  . Alcohol use: No    Comment: quit long ago  . Drug use: No  . Sexual activity:  Not on file  Lifestyle  . Physical activity:    Days per week: Not on file    Minutes per session: Not on file  . Stress: Not on file  Relationships  . Social connections:    Talks on phone: Not on file    Gets together: Not on file    Attends religious service: Not on file    Active member of club or organization: Not on file  Attends meetings of clubs or organizations: Not on file    Relationship status: Not on file  . Intimate partner violence:    Fear of current or ex partner: No    Emotionally abused: No    Physically abused: No    Forced sexual activity: No  Other Topics Concern  . Not on file  Social History Narrative   Lives with sister   Caffeine- rarely     PHYSICAL EXAM  GENERAL EXAM/CONSTITUTIONAL: Vitals:  Vitals:   05/11/18 1243  BP: 129/64  Pulse: 88  Weight: 183 lb 6.4 oz (83.2 kg)  Height: 5\' 5"  (1.651 m)     Body mass index is 30.52 kg/m. Wt Readings from Last 3 Encounters:  05/11/18 183 lb 6.4 oz (83.2 kg)  05/07/18 183 lb 1.6 oz (83.1 kg)  04/27/18 178 lb (80.7 kg)     Patient is in no distress; well developed, nourished and groomed; neck is supple  CARDIOVASCULAR:  Examination of carotid arteries is normal; no carotid bruits  Regular rate and rhythm, no murmurs  Examination of peripheral vascular system by observation and palpation is normal  EYES:  Ophthalmoscopic exam of optic discs and posterior segments is normal; no papilledema or hemorrhages  Visual Acuity Screening   Right eye Left eye Both eyes  Without correction:     With correction: 20/30 20/30      MUSCULOSKELETAL:  Gait, strength, tone, movements noted in Neurologic exam below  NEUROLOGIC: MENTAL STATUS:  No flowsheet data found.  awake, alert, oriented to person, place and time  recent and remote memory intact  normal attention and concentration  language fluent, comprehension intact, naming intact  fund of knowledge appropriate  CRANIAL NERVE:    2nd - no papilledema on fundoscopic exam  2nd, 3rd, 4th, 6th - pupils equal and reactive to light, visual fields full to confrontation, extraocular muscles intact, no nystagmus  5th - facial sensation symmetric  7th - facial strength symmetric  8th - hearing intact  9th - palate elevates symmetrically, uvula midline  11th - shoulder shrug symmetric  12th - tongue protrusion midline  MOTOR:   normal bulk and tone, full strength in the BUE, BLE  SENSORY:   normal and symmetric to light touch, temperature, vibration  COORDINATION:   finger-nose-finger, fine finger movements normal  REFLEXES:   deep tendon reflexes TRACE and symmetric  GAIT/STATION:   narrow based gait     DIAGNOSTIC DATA (LABS, IMAGING, TESTING) - I reviewed patient records, labs, notes, testing and imaging myself where available.  Lab Results  Component Value Date   WBC 4.8 05/07/2018   HGB 8.8 (L) 05/07/2018   HCT 27.6 (L) 05/07/2018   MCV 95.2 05/07/2018   PLT 206 05/07/2018      Component Value Date/Time   NA 138 05/07/2018 0903   NA 138 12/22/2017 1235   K 4.5 05/07/2018 0903   CL 106 05/07/2018 0903   CO2 23 05/07/2018 0903   GLUCOSE 115 (H) 05/07/2018 0903   BUN 14 05/07/2018 0903   BUN 12 12/22/2017 1235   CREATININE 0.88 05/07/2018 0903   CREATININE 0.90 03/26/2018 0859   CALCIUM 9.2 05/07/2018 0903   PROT 6.8 05/07/2018 0903   PROT 7.6 12/22/2017 1235   ALBUMIN 3.7 05/07/2018 0903   ALBUMIN 4.8 12/22/2017 1235   AST 53 (H) 05/07/2018 0903   AST 20 03/26/2018 0859   ALT 37 05/07/2018 0903   ALT 19 03/26/2018 0859   ALKPHOS 94  05/07/2018 0903   BILITOT 0.6 05/07/2018 0903   BILITOT 0.7 03/26/2018 0859   GFRNONAA >60 05/07/2018 0903   GFRNONAA >60 03/26/2018 0859   GFRAA >60 05/07/2018 0903   GFRAA >60 03/26/2018 0859   Lab Results  Component Value Date   CHOL 176 12/22/2017   HDL 61 12/22/2017   LDLCALC 93 12/22/2017   TRIG 111 12/22/2017   Lab Results    Component Value Date   HGBA1C 6.1 (H) 12/22/2017   Lab Results  Component Value Date   IEPPIRJJ88 416 12/22/2017   Lab Results  Component Value Date   TSH 3.644 03/10/2018    03/09/18  EEG - Clinical Interpretation: This normal EEG is recorded in the waking and sleep state. There was no seizure or seizure predisposition recorded on this study. Please note that lack of epileptiform activity on EEG does not preclude the possibility of epilepsy.   03/08/18 MRI brain w/wo 1. No intracranial metastatic disease. 2. Numerous scattered white matter lesions, most likely indicating chronic small vessel ischemia.    ASSESSMENT AND PLAN  63 y.o. year old female here with 2 episodes of lightheadedness, loss of consciousness, brief twitching movements of arms, likely convulsive syncope.  Patient had rapid return to consciousness and prodromal lightheadedness.  Dx:  1. Convulsive syncope      PLAN:  CONVULSIVE SYNCOPE (? DEHYDRATION; ? CHEMOTHERAPY; seizure less likely)  - stay hydrated; monitor BP at home  - According to [place] law, you can not drive unless you are seizure / syncope free for at least 6 months and under physician's care.   - Please maintain precautions. Do not participate in activities where a loss of awareness could harm you or someone else. No swimming alone, no tub bathing, no hot tubs, no driving, no operating motorized vehicles (cars, ATVs, motocycles, etc), lawnmowers, power tools or firearms. No standing at heights, such as rooftops, ladders or stairs. Avoid hot objects such as stoves, heaters, open fires. Wear a helmet when riding a bicycle, scooter, skateboard, etc. and avoid areas of traffic. Set your water heater to 120 degrees or less.  Return for return to PCP, pending if symptoms worsen or fail to improve.    Penni Bombard, MD 08/28/3014, 0:10 PM Certified in Neurology, Neurophysiology and Neuroimaging  [facility] [address],  [place], [place] [address] [phone]

## 2018-05-11 NOTE — Patient Instructions (Signed)
  CONVULSIVE SYNCOPE (? DEHYDRATION; ? CHEMOTHERAPY; seizure less likely)  - stay hydrated; monitor BP at home  - According to [place] law, you can not drive unless you are seizure / syncope free for at least 6 months and under physician's care.   - Please maintain precautions. Do not participate in activities where a loss of awareness could harm you or someone else. No swimming alone, no tub bathing, no hot tubs, no driving, no operating motorized vehicles (cars, ATVs, motocycles, etc), lawnmowers, power tools or firearms. No standing at heights, such as rooftops, ladders or stairs. Avoid hot objects such as stoves, heaters, open fires. Wear a helmet when riding a bicycle, scooter, skateboard, etc. and avoid areas of traffic. Set your water heater to 120 degrees or less.

## 2018-05-14 ENCOUNTER — Ambulatory Visit: Payer: Managed Care, Other (non HMO) | Admitting: Hematology and Oncology

## 2018-05-14 ENCOUNTER — Other Ambulatory Visit: Payer: Managed Care, Other (non HMO)

## 2018-05-14 ENCOUNTER — Ambulatory Visit: Payer: Managed Care, Other (non HMO)

## 2018-05-14 ENCOUNTER — Encounter: Payer: Self-pay | Admitting: Hematology and Oncology

## 2018-05-16 ENCOUNTER — Encounter: Payer: Self-pay | Admitting: Hematology and Oncology

## 2018-05-18 ENCOUNTER — Telehealth: Payer: Self-pay

## 2018-05-18 ENCOUNTER — Ambulatory Visit (INDEPENDENT_AMBULATORY_CARE_PROVIDER_SITE_OTHER): Payer: Managed Care, Other (non HMO) | Admitting: Bariatrics

## 2018-05-18 NOTE — Telephone Encounter (Signed)
Pt called asking about her disability forms stating the company says they have faxed them to [place] multiple times with no reply.  No recent documentation of disability / fmla paper for this patient.  I instructed her to have the forms faxed to [phone] and we will get them to the correct department

## 2018-05-18 NOTE — Telephone Encounter (Signed)
Completed disability documents faxed to the [employer]

## 2018-05-26 ENCOUNTER — Encounter: Payer: Self-pay | Admitting: Adult Health

## 2018-05-26 NOTE — Progress Notes (Addendum)
[facility] Follow up:    Christy Serrano, [place] [address]   DIAGNOSIS: Cancer Staging Malignant neoplasm of left female breast [facility]) Staging form: Breast, AJCC 8th Edition - Clinical: Stage IIIA (cT3, cN1, cM0, G3, ER-, PR-, HER2+) - Signed by Nicholas Lose, MD on 02/03/2018   SUMMARY OF ONCOLOGIC HISTORY:   Malignant neoplasm of left female breast ([place])   01/18/2018 Initial Diagnosis    Screening mammogram detected microcalcifications and asymmetry, left breast segmental microcalcifications UOQ 1.5 x 4.3 x 5.4 cm biopsy-proven IDC with DCIS with LV I, grade 3, ER 0%, PR 0%, Ki-67 30%, HER-2 +3+ by IHC; hypoechoic mass 2 o'clock position 1.2 x 1.3 x 1.7 cm biopsy IDC, LV I present, grade 2-3, ER 0%, PR 0%, Ki-67 40%, HER-2 +3+, left axillary lymph node positive (3 nodes noted by [place]) T3 N1 stage IIIA    02/03/2018 Cancer Staging    Staging form: Breast, AJCC 8th Edition - Clinical: Stage IIIA (cT3, cN1, cM0, G3, ER-, PR-, HER2+) - Signed by Nicholas Lose, MD on 02/03/2018    02/20/2018 Genetic Testing    No pathogenic variants identified on the [facility] Expanded + RNA insight panel. Genes Analyzed (67 total): AIP, ALK, APC*, ATM*, BAP1, BARD1, BLM, BMPR1A, BRCA1*, BRCA2*, BRIP1*, CDH1*, CDK4, CDKN1B, CDKN2A, CHEK2*, DICER1, FANCC, FH, FLCN, GALNT12, HOXB13, MAX, MEN1, MET, MLH1*, MRE11A, MSH2*, MSH6*, MUTYH*, NBN, NF1*, NF2, PALB2*, PHOX2B, PMS2*, POLD1, POLE, POT1, PRKAR1A, PTCH1, PTEN*, RAD50, RAD51C*, RAD51D*, RB1, RET, SDHA, SDHAF2, SDHB, SDHC, SDHD, SMAD4, SMARCA4, SMARCB1, SMARCE1, STK11, SUFU, TMEM127, TP53*, TSC1, TSC2, VHL and XRCC2 (sequencing and deletion/duplication); MITF (sequencing only); EPCAM and GREM1 (deletion/duplication only). DNA and RNA analyses performed for * genes. The report date is 02/20/2018.    03/04/2018 -  Neo-Adjuvant Chemotherapy    Neoadjuvant chemotherapy with [facility] Perjeta    03/08/2018 - 03/10/2018  Hospital Admission    Admitted for recurrent syncope with loss of consciousness for up to 5 minutes with loss of bladder, no abnormalities identified.  MRI brain, carotid ultrasound, EEG studies were negative.     CURRENT THERAPY: cycle 5 day 1 of TCHP  INTERVAL HISTORY: Christy Serrano 63 y.o. female returns for evaluation prior to receiving her fifth cycle of neoadjuvant TCHP.  She is doing moderately well today.  She is still having swelling in her right ankle that worsens 3-4 days prior to her chemotherapy treatment (today is Thursday, so it started Tuesday evening).  She underwent bilateral lower extremity dopplers on 05/10/2018 that were negative for DVT.  She continues to have a NP cough that is intermittent.  She is using cough medication and her inhaler and this hasn't helped.  She is taking Protonix in the morning, after she eats.    Christy Serrano has developed a mild constant numbness in her fingertips.  She has no motor deficits, however has noted intermittent pain with this numbness and tingling.     Patient Active Problem List   Diagnosis Date Noted  . Seizure-like activity ([place])   . Hyponatremia   . Recurrent syncope 03/08/2018  . LFT elevation   . Malignant neoplasm of left female breast ([place])   . Seizure ([place])   . Thrombocytopenia ([place])   . Port-A-Cath in place 03/04/2018  . Genetic testing 02/23/2018  . Family history of breast cancer   . Family history of prostate cancer   . Family history of ovarian cancer   . Malignant neoplasm of left  female breast ([place]) 02/03/2018  . Prediabetes 01/06/2018  . Reactive airway disease without complication 70/26/3785  . Class 1 obesity with serious comorbidity and body mass index (BMI) of 32.0 to 32.9 in adult 12/22/2017    is allergic to oxycodone-acetaminophen and amoxicillin.  MEDICAL HISTORY: Past Medical History:  Diagnosis Date  . Cancer [facility])    left breast  . Family history of breast cancer   . Family history of ovarian  cancer   . Family history of prostate cancer   . Lactose intolerance   . PONV (postoperative nausea and vomiting)     SURGICAL HISTORY: Past Surgical History:  Procedure Laterality Date  . PORTACATH PLACEMENT Right 03/03/2018   Procedure: INSERTION PORT-A-CATH;  Surgeon: Jovita Kussmaul, MD;  Location: [facility];  Service: General;  Laterality: Right;  . ROTATOR CUFF REPAIR Right 2006  . TOTAL ABDOMINAL HYSTERECTOMY  2010    SOCIAL HISTORY: Social History   Socioeconomic History  . Marital status: Single    Spouse name: Not on file  . Number of children: 0  . Years of education: 55  . Highest education level: Not on file  Occupational History  . Occupation: Quarry manager    Comment: [employer]  . Financial resource strain: Not on file  . Food insecurity:    Worry: Not on file    Inability: Not on file  . Transportation needs:    Medical: No    Non-medical: No  Tobacco Use  . Smoking status: Never Smoker  . Smokeless tobacco: Never Used  Substance and Sexual Activity  . Alcohol use: No    Comment: quit long ago  . Drug use: No  . Sexual activity: Not on file  Lifestyle  . Physical activity:    Days per week: Not on file    Minutes per session: Not on file  . Stress: Not on file  Relationships  . Social connections:    Talks on phone: Not on file    Gets together: Not on file    Attends religious service: Not on file    Active member of club or organization: Not on file    Attends meetings of clubs or organizations: Not on file    Relationship status: Not on file  . Intimate partner violence:    Fear of current or ex partner: No    Emotionally abused: No    Physically abused: No    Forced sexual activity: No  Other Topics Concern  . Not on file  Social History Narrative   Lives with sister   Caffeine- rarely    FAMILY HISTORY: Family History  Problem Relation Age of Onset  . High blood pressure Mother   . Heart attack Mother         CHF  . High blood pressure Father   . Prostate cancer Father        dx mid 56s  . Breast cancer Sister 98       negative [employer] 46 gene panel  . Breast cancer Paternal Aunt        dx mid 75s  . Cancer Paternal Uncle        jaw cancer d. 12s  . Ovarian cancer Maternal Grandmother        d. 57s  . Cancer Paternal Grandmother        either stomach or ovarian cancer  . Breast cancer Sister 29       "  negative 21 gene panel"    Review of Systems  Constitutional: Negative for appetite change, chills, fatigue, fever and unexpected weight change.  HENT:   Negative for hearing loss, lump/mass, mouth sores and trouble swallowing.   Eyes: Negative for eye problems and icterus.  Respiratory: Positive for cough (see interval history). Negative for chest tightness and shortness of breath.   Cardiovascular: Positive for leg swelling (see interval history). Negative for chest pain and palpitations.  Gastrointestinal: Negative for abdominal distention, abdominal pain, constipation, diarrhea, nausea and vomiting.  Endocrine: Negative for hot flashes.  Genitourinary: Negative for difficulty urinating.   Musculoskeletal: Negative for arthralgias.  Skin: Negative for itching and rash.  Neurological: Positive for numbness. Negative for dizziness, extremity weakness and headaches.  Hematological: Negative for adenopathy. Does not bruise/bleed easily.  Psychiatric/Behavioral: Negative for depression. The patient is not nervous/anxious.       PHYSICAL EXAMINATION  ECOG PERFORMANCE STATUS: 1 - Symptomatic but completely ambulatory  Vitals:   05/27/18 0920  BP: 116/62  Pulse: 77  Resp: 18  Temp: 97.7 F (36.5 C)  SpO2: 100%    Physical Exam Constitutional:      General: She is not in acute distress.    Appearance: Normal appearance.  HENT:     Head: Normocephalic and atraumatic.     Nose: No congestion.     Mouth/Throat:     Mouth: Mucous membranes are moist.     Pharynx:  Oropharynx is clear. No oropharyngeal exudate or posterior oropharyngeal erythema.  Eyes:     General: No scleral icterus.    Pupils: Pupils are equal, round, and reactive to light.  Neck:     Musculoskeletal: Neck supple.  Cardiovascular:     Rate and Rhythm: Normal rate and regular rhythm.     Pulses: Normal pulses.     Heart sounds: Normal heart sounds.  Pulmonary:     Effort: Pulmonary effort is normal.     Breath sounds: Normal breath sounds.     Comments: Left breast with some firmness underneath biopsy site, otherwise difficult to palpate any definite nodules or masses, no axillary adenopathy Abdominal:     General: Abdomen is flat. Bowel sounds are normal. There is no distension.     Palpations: Abdomen is soft.     Tenderness: There is no abdominal tenderness.  Musculoskeletal:        General: Swelling (one plus bilateral lower extremites) present.  Lymphadenopathy:     Cervical: No cervical adenopathy.  Skin:    General: Skin is warm and dry.     Capillary Refill: Capillary refill takes less than 2 seconds.  Neurological:     General: No focal deficit present.     Mental Status: She is alert and oriented to person, place, and time.  Psychiatric:        Mood and Affect: Mood normal.        Behavior: Behavior normal.     LABORATORY DATA:  CBC    Component Value Date/Time   WBC 7.2 05/27/2018 0908   RBC 2.78 (L) 05/27/2018 0908   HGB 8.7 (L) 05/27/2018 0908   HGB 9.7 (L) 03/26/2018 0859   HGB 12.3 12/22/2017 1235   HCT 27.8 (L) 05/27/2018 0908   HCT 38.2 12/22/2017 1235   PLT 208 05/27/2018 0908   PLT 306 03/26/2018 0859   MCV 100.0 05/27/2018 0908   MCV 92 12/22/2017 1235   MCH 31.3 05/27/2018 0908   MCHC 31.3 05/27/2018 0908  RDW 20.4 (H) 05/27/2018 0908   RDW 14.8 12/22/2017 1235   LYMPHSABS 1.3 05/27/2018 0908   LYMPHSABS 2.0 12/22/2017 1235   MONOABS 0.1 05/27/2018 0908   EOSABS 0.0 05/27/2018 0908   EOSABS 0.0 12/22/2017 1235   BASOSABS 0.0  05/27/2018 0908   BASOSABS 0.0 12/22/2017 1235    CMP     Component Value Date/Time   NA 137 05/27/2018 0908   NA 138 12/22/2017 1235   K 4.3 05/27/2018 0908   CL 107 05/27/2018 0908   CO2 24 05/27/2018 0908   GLUCOSE 117 (H) 05/27/2018 0908   BUN 14 05/27/2018 0908   BUN 12 12/22/2017 1235   CREATININE 0.83 05/27/2018 0908   CREATININE 0.90 03/26/2018 0859   CALCIUM 9.0 05/27/2018 0908   PROT 6.6 05/27/2018 0908   PROT 7.6 12/22/2017 1235   ALBUMIN 3.6 05/27/2018 0908   ALBUMIN 4.8 12/22/2017 1235   AST 49 (H) 05/27/2018 0908   AST 20 03/26/2018 0859   ALT 32 05/27/2018 0908   ALT 19 03/26/2018 0859   ALKPHOS 70 05/27/2018 0908   BILITOT 0.3 05/27/2018 0908   BILITOT 0.7 03/26/2018 0859   GFRNONAA >60 05/27/2018 0908   GFRNONAA >60 03/26/2018 0859   GFRAA >60 05/27/2018 0908   GFRAA >60 03/26/2018 0859      ASSESSMENT and THERAPY PLAN:   Malignant neoplasm of left female breast ([place]) 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, grade 3, ER 0%, PR 0%, Ki-67 30%, HER-2 +3+ by IHC; hypoechoic mass 2 o'clock position 1.2 x 1.3 x 1.7 cm biopsy IDC, LV I present, grade 2-3, ER 0%, PR 0%, Ki-67 40%, HER-2 +3+, left axillary lymph node positive (3 nodes noted by [place]) T3 N1 stage IIIA  Treatment plan 1.  Neoadjuvant chemotherapy with [place] followed by Herceptin Perjeta maintenance versus Kadcyla maintenance 2. possible mastectomy with targeted node dissection 3.  Adjuvant radiation therapy --------------------------------------------------------------------------------------------------------------------------------------------------- Current treatment: Cycle 5 day 1 TCH Perjeta  Echo 12/10 shows LVEF of 60-65%--next echo scheduled next week  Chemo toxicities: 1.  Recurrent syncope requiring hospitalization: Unclear etiology, no further  issues. 2.  Diarrhea: Imodium, Lomotil, Questran.  She is managing this.    3. Nail dyscrasia: No brittleness at this point, recommended she keep nails trim and moisturized. 4. Scalp Discomfort: resolved 5. Lower extremity swelling: bilateral lower extremity dopplers are negative, added lasix 73m daily as needed.   6. Peripheral neuropathy: patient met with Dr. GLindi Adietoday, who reviewed her neuropathy and dose reduced her chemotherapy.      Return to clinic in 3 weeks for cycle 6.        All questions were answered. The patient knows to call the clinic with any problems, questions or concerns. We can certainly see the patient much sooner if necessary. This note was electronically signed. LScot Dock NP 05/27/2018   Attending Note  I personally saw and examined MMackie Pai The plan of care was discussed with her. I agree with the assessment and plan as documented above. Cycle 5 TCH Perjeta: We will reduce the dosage of chemotherapy because of neuropathy. Lower extremity swelling: Lasix being prescribed today. Monitoring closely for toxicities especially diarrhea. Return to clinic in 3 weeks for 6th cycle after which she will get a breast MRI. Signed VHarriette Ohara MD

## 2018-05-26 NOTE — Assessment & Plan Note (Addendum)
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  Neoadjuvant chemotherapy with [place] followed by Herceptin Perjeta maintenance versus Kadcyla maintenance 2. possible mastectomy with targeted node dissection 3.  Adjuvant radiation therapy --------------------------------------------------------------------------------------------------------------------------------------------------- Current treatment: Cycle 5 day 1 TCH Perjeta  Echo 12/10 shows LVEF of 60-65%--next echo scheduled next week  Chemo toxicities: 1.  Recurrent syncope requiring hospitalization: Unclear etiology, no further issues. 2.  Diarrhea: Imodium, Lomotil, Questran.  She is managing this.    3. Nail dyscrasia: No brittleness at this point, recommended she keep nails trim and moisturized. 4. Scalp Discomfort: resolved 5. Lower extremity swelling: bilateral lower extremity dopplers are negative, added lasix 79m daily as needed.   6. Peripheral neuropathy: patient met with Dr. GLindi Adietoday, who reviewed her neuropathy and dose reduced her chemotherapy.      Return to clinic in 3 weeks for cycle 6.

## 2018-05-27 ENCOUNTER — Inpatient Hospital Stay: Payer: Managed Care, Other (non HMO)

## 2018-05-27 ENCOUNTER — Encounter: Payer: Self-pay | Admitting: Adult Health

## 2018-05-27 ENCOUNTER — Other Ambulatory Visit: Payer: Self-pay

## 2018-05-27 ENCOUNTER — Inpatient Hospital Stay: Payer: Managed Care, Other (non HMO) | Attending: Hematology and Oncology

## 2018-05-27 ENCOUNTER — Inpatient Hospital Stay: Payer: Managed Care, Other (non HMO) | Admitting: Adult Health

## 2018-05-27 VITALS — BP 116/62 | HR 77 | Temp 97.7°F | Resp 18 | Ht 65.0 in | Wt 185.1 lb

## 2018-05-27 DIAGNOSIS — J45909 Unspecified asthma, uncomplicated: Secondary | ICD-10-CM | POA: Diagnosis not present

## 2018-05-27 DIAGNOSIS — Z171 Estrogen receptor negative status [ER-]: Secondary | ICD-10-CM

## 2018-05-27 DIAGNOSIS — K219 Gastro-esophageal reflux disease without esophagitis: Secondary | ICD-10-CM | POA: Insufficient documentation

## 2018-05-27 DIAGNOSIS — C50412 Malignant neoplasm of upper-outer quadrant of left female breast: Secondary | ICD-10-CM

## 2018-05-27 DIAGNOSIS — Z7689 Persons encountering health services in other specified circumstances: Secondary | ICD-10-CM | POA: Insufficient documentation

## 2018-05-27 DIAGNOSIS — C50812 Malignant neoplasm of overlapping sites of left female breast: Secondary | ICD-10-CM

## 2018-05-27 DIAGNOSIS — Z79899 Other long term (current) drug therapy: Secondary | ICD-10-CM

## 2018-05-27 DIAGNOSIS — Z5111 Encounter for antineoplastic chemotherapy: Secondary | ICD-10-CM | POA: Insufficient documentation

## 2018-05-27 DIAGNOSIS — G629 Polyneuropathy, unspecified: Secondary | ICD-10-CM | POA: Diagnosis not present

## 2018-05-27 DIAGNOSIS — Z95828 Presence of other vascular implants and grafts: Secondary | ICD-10-CM

## 2018-05-27 LAB — COMPREHENSIVE METABOLIC PANEL
ALT: 32 U/L (ref 0–44)
AST: 49 U/L — ABNORMAL HIGH (ref 15–41)
Albumin: 3.6 g/dL (ref 3.5–5.0)
Alkaline Phosphatase: 70 U/L (ref 38–126)
Anion gap: 6 (ref 5–15)
BUN: 14 mg/dL (ref 6–20)
CO2: 24 mmol/L (ref 22–32)
Calcium: 9 mg/dL (ref 8.9–10.3)
Chloride: 107 mmol/L (ref 98–111)
Creatinine, Ser: 0.83 mg/dL (ref 0.44–1.00)
GFR calc Af Amer: >60 mL/min (ref >60)
GFR calc non Af Amer: >60 mL/min (ref >60)
Glucose, Bld: 117 mg/dL — ABNORMAL HIGH (ref 70–99)
Potassium: 4.3 mmol/L (ref 3.5–5.1)
Sodium: 137 mmol/L (ref 135–145)
Total Bilirubin: 0.3 mg/dL (ref 0.3–1.2)
Total Protein: 6.6 g/dL (ref 6.5–8.1)

## 2018-05-27 LAB — CBC WITH DIFFERENTIAL/PLATELET
Abs Immature Granulocytes: 0.04 10*3/uL (ref 0.00–0.07)
Basophils Absolute: 0 10*3/uL (ref 0.0–0.1)
Basophils Relative: 0 %
EOS ABS: 0 10*3/uL (ref 0.0–0.5)
Eosinophils Relative: 0 %
HCT: 27.8 % — ABNORMAL LOW (ref 36.0–46.0)
Hemoglobin: 8.7 g/dL — ABNORMAL LOW (ref 12.0–15.0)
Immature Granulocytes: 1 %
Lymphocytes Relative: 18 %
Lymphs Abs: 1.3 10*3/uL (ref 0.7–4.0)
MCH: 31.3 pg (ref 26.0–34.0)
MCHC: 31.3 g/dL (ref 30.0–36.0)
MCV: 100 fL (ref 80.0–100.0)
Monocytes Absolute: 0.1 10*3/uL (ref 0.1–1.0)
Monocytes Relative: 1 %
Neutro Abs: 5.8 10*3/uL (ref 1.7–7.7)
Neutrophils Relative %: 80 %
Platelets: 208 10*3/uL (ref 150–400)
RBC: 2.78 MIL/uL — ABNORMAL LOW (ref 3.87–5.11)
RDW: 20.4 % — ABNORMAL HIGH (ref 11.5–15.5)
WBC: 7.2 10*3/uL (ref 4.0–10.5)
nRBC: 0 % (ref 0.0–0.2)

## 2018-05-27 MED ORDER — ACETAMINOPHEN 325 MG PO TABS
ORAL_TABLET | ORAL | Status: AC
  Filled 2018-05-27: qty 2

## 2018-05-27 MED ORDER — PALONOSETRON HCL INJECTION 0.25 MG/5ML
0.2500 mg | Freq: Once | INTRAVENOUS | Status: AC
  Administered 2018-05-27: 0.25 mg via INTRAVENOUS

## 2018-05-27 MED ORDER — DIPHENHYDRAMINE HCL 25 MG PO CAPS
ORAL_CAPSULE | ORAL | Status: AC
  Filled 2018-05-27: qty 2

## 2018-05-27 MED ORDER — SODIUM CHLORIDE 0.9 % IV SOLN
620.0000 mg | Freq: Once | INTRAVENOUS | Status: AC
  Administered 2018-05-27: 620 mg via INTRAVENOUS
  Filled 2018-05-27: qty 62

## 2018-05-27 MED ORDER — HEPARIN SOD (PORK) LOCK FLUSH 100 UNIT/ML IV SOLN
500.0000 [IU] | Freq: Once | INTRAVENOUS | Status: AC | PRN
  Administered 2018-05-27: 500 [IU]
  Filled 2018-05-27: qty 5

## 2018-05-27 MED ORDER — DIPHENHYDRAMINE HCL 25 MG PO CAPS
50.0000 mg | ORAL_CAPSULE | Freq: Once | ORAL | Status: AC
  Administered 2018-05-27: 50 mg via ORAL

## 2018-05-27 MED ORDER — ACETAMINOPHEN 325 MG PO TABS
650.0000 mg | ORAL_TABLET | Freq: Once | ORAL | Status: AC
  Administered 2018-05-27: 650 mg via ORAL

## 2018-05-27 MED ORDER — SODIUM CHLORIDE 0.9% FLUSH
10.0000 mL | INTRAVENOUS | Status: DC | PRN
  Administered 2018-05-27: 10 mL
  Filled 2018-05-27: qty 10

## 2018-05-27 MED ORDER — POTASSIUM CHLORIDE CRYS ER 20 MEQ PO TBCR: 20.0000 meq | EXTENDED_RELEASE_TABLET | Freq: Every day | ORAL | 1 refills | Status: DC

## 2018-05-27 MED ORDER — TRASTUZUMAB CHEMO 150 MG IV SOLR
6.0000 mg/kg | Freq: Once | INTRAVENOUS | Status: AC
  Administered 2018-05-27: 525 mg via INTRAVENOUS
  Filled 2018-05-27: qty 25

## 2018-05-27 MED ORDER — SODIUM CHLORIDE 0.9 % IV SOLN
60.0000 mg/m2 | Freq: Once | INTRAVENOUS | Status: AC
  Administered 2018-05-27: 120 mg via INTRAVENOUS
  Filled 2018-05-27: qty 12

## 2018-05-27 MED ORDER — SODIUM CHLORIDE 0.9 % IV SOLN
Freq: Once | INTRAVENOUS | Status: AC
  Administered 2018-05-27: 11:00:00 via INTRAVENOUS
  Filled 2018-05-27: qty 5

## 2018-05-27 MED ORDER — PALONOSETRON HCL INJECTION 0.25 MG/5ML
INTRAVENOUS | Status: AC
  Filled 2018-05-27: qty 5

## 2018-05-27 MED ORDER — PROCHLORPERAZINE MALEATE 10 MG PO TABS: 10.0000 mg | ORAL_TABLET | Freq: Four times a day (QID) | ORAL | 1 refills | Status: DC | PRN

## 2018-05-27 MED ORDER — FUROSEMIDE 20 MG PO TABS: 20.0000 mg | ORAL_TABLET | Freq: Every day | ORAL | 1 refills | Status: DC | PRN

## 2018-05-27 MED ORDER — SODIUM CHLORIDE 0.9 % IV SOLN
420.0000 mg | Freq: Once | INTRAVENOUS | Status: AC
  Administered 2018-05-27: 420 mg via INTRAVENOUS
  Filled 2018-05-27: qty 14

## 2018-05-27 MED ORDER — SODIUM CHLORIDE 0.9 % IV SOLN
Freq: Once | INTRAVENOUS | Status: AC
  Administered 2018-05-27: 10:00:00 via INTRAVENOUS
  Filled 2018-05-27: qty 250

## 2018-05-27 MED FILL — POTASSIUM CHLORIDE CRYS ER: 20 | 30 days supply | Qty: 30 | Fill #0

## 2018-05-27 MED FILL — FUROSEMIDE 20 MG TAB: 20 | 30 days supply | Qty: 30 | Fill #0

## 2018-05-27 MED FILL — PROCHLORPERAZINE 10 MG TAB: 10 | 7 days supply | Qty: 30 | Fill #0

## 2018-05-27 NOTE — Patient Instructions (Signed)
[facility] Discharge Instructions for Patients Receiving Chemotherapy  Today you received the following chemotherapy agents:  Herceptin, Perjeta, Taxotere, and Carboplatin.  To help prevent nausea and vomiting after your treatment, we encourage you to take your nausea medication as directed.   If you develop nausea and vomiting that is not controlled by your nausea medication, call the clinic.   BELOW ARE SYMPTOMS THAT SHOULD BE REPORTED IMMEDIATELY:  *FEVER GREATER THAN 100.5 F  *CHILLS WITH OR WITHOUT FEVER  NAUSEA AND VOMITING THAT IS NOT CONTROLLED WITH YOUR NAUSEA MEDICATION  *UNUSUAL SHORTNESS OF BREATH  *UNUSUAL BRUISING OR BLEEDING  TENDERNESS IN MOUTH AND THROAT WITH OR WITHOUT PRESENCE OF ULCERS  *URINARY PROBLEMS  *BOWEL PROBLEMS  UNUSUAL RASH Items with * indicate a potential emergency and should be followed up as soon as possible.  Feel free to call the clinic should you have any questions or concerns. The clinic phone number is [phone].  Please show the CHEMO ALERT CARD at check-in to the Emergency Department and triage nurse.   

## 2018-05-28 ENCOUNTER — Ambulatory Visit: Payer: Managed Care, Other (non HMO) | Admitting: Hematology and Oncology

## 2018-05-28 ENCOUNTER — Other Ambulatory Visit: Payer: Managed Care, Other (non HMO)

## 2018-05-28 ENCOUNTER — Ambulatory Visit: Payer: Managed Care, Other (non HMO)

## 2018-05-28 ENCOUNTER — Other Ambulatory Visit: Payer: Self-pay | Admitting: *Deleted

## 2018-05-29 ENCOUNTER — Inpatient Hospital Stay: Payer: Managed Care, Other (non HMO)

## 2018-05-29 VITALS — BP 128/63 | HR 86 | Temp 98.2°F | Resp 17

## 2018-05-29 DIAGNOSIS — Z171 Estrogen receptor negative status [ER-]: Principal | ICD-10-CM

## 2018-05-29 DIAGNOSIS — C50812 Malignant neoplasm of overlapping sites of left female breast: Secondary | ICD-10-CM

## 2018-05-29 DIAGNOSIS — C50412 Malignant neoplasm of upper-outer quadrant of left female breast: Secondary | ICD-10-CM | POA: Diagnosis not present

## 2018-05-29 MED ORDER — PEGFILGRASTIM-CBQV 6 MG/0.6ML ~~LOC~~ SOSY
6.0000 mg | PREFILLED_SYRINGE | Freq: Once | SUBCUTANEOUS | Status: AC
  Administered 2018-05-29: 6 mg via SUBCUTANEOUS

## 2018-05-29 MED ORDER — PEGFILGRASTIM-CBQV 6 MG/0.6ML ~~LOC~~ SOSY
PREFILLED_SYRINGE | SUBCUTANEOUS | Status: AC
  Filled 2018-05-29: qty 0.6

## 2018-05-29 NOTE — Patient Instructions (Signed)
Pegfilgrastim injection  What is this medicine?  PEGFILGRASTIM (PEG fil gra stim) is a long-acting granulocyte colony-stimulating factor that stimulates the growth of neutrophils, a type of white blood cell important in the body's fight against infection. It is used to reduce the incidence of fever and infection in patients with certain types of cancer who are receiving chemotherapy that affects the bone marrow, and to increase survival after being exposed to high doses of radiation.  This medicine may be used for other purposes; ask your health care provider or pharmacist if you have questions.  COMMON BRAND NAME(S): Fulphila, Neulasta, UDENYCA  What should I tell my health care provider before I take this medicine?  They need to know if you have any of these conditions:  -kidney disease  -latex allergy  -ongoing radiation therapy  -sickle cell disease  -skin reactions to acrylic adhesives (On-Body Injector only)  -an unusual or allergic reaction to pegfilgrastim, filgrastim, other medicines, foods, dyes, or preservatives  -pregnant or trying to get pregnant  -breast-feeding  How should I use this medicine?  This medicine is for injection under the skin. If you get this medicine at home, you will be taught how to prepare and give the pre-filled syringe or how to use the On-body Injector. Refer to the patient Instructions for Use for detailed instructions. Use exactly as directed. Tell your healthcare provider immediately if you suspect that the On-body Injector may not have performed as intended or if you suspect the use of the On-body Injector resulted in a missed or partial dose.  It is important that you put your used needles and syringes in a special sharps container. Do not put them in a trash can. If you do not have a sharps container, call your pharmacist or healthcare provider to get one.  Talk to your pediatrician regarding the use of this medicine in children. While this drug may be prescribed for  selected conditions, precautions do apply.  Overdosage: If you think you have taken too much of this medicine contact a poison control center or emergency room at once.  NOTE: This medicine is only for you. Do not share this medicine with others.  What if I miss a dose?  It is important not to miss your dose. Call your doctor or health care professional if you miss your dose. If you miss a dose due to an On-body Injector failure or leakage, a new dose should be administered as soon as possible using a single prefilled syringe for manual use.  What may interact with this medicine?  Interactions have not been studied.  Give your health care provider a list of all the medicines, herbs, non-prescription drugs, or dietary supplements you use. Also tell them if you smoke, drink alcohol, or use illegal drugs. Some items may interact with your medicine.  This list may not describe all possible interactions. Give your health care provider a list of all the medicines, herbs, non-prescription drugs, or dietary supplements you use. Also tell them if you smoke, drink alcohol, or use illegal drugs. Some items may interact with your medicine.  What should I watch for while using this medicine?  You may need blood work done while you are taking this medicine.  If you are going to need a MRI, CT scan, or other procedure, tell your doctor that you are using this medicine (On-Body Injector only).  What side effects may I notice from receiving this medicine?  Side effects that you should report to   your doctor or health care professional as soon as possible:  -allergic reactions like skin rash, itching or hives, swelling of the face, lips, or tongue  -back pain  -dizziness  -fever  -pain, redness, or irritation at site where injected  -pinpoint red spots on the skin  -red or dark-brown urine  -shortness of breath or breathing problems  -stomach or side pain, or pain at the shoulder  -swelling  -tiredness  -trouble passing urine or  change in the amount of urine  Side effects that usually do not require medical attention (report to your doctor or health care professional if they continue or are bothersome):  -bone pain  -muscle pain  This list may not describe all possible side effects. Call your doctor for medical advice about side effects. You may report side effects to FDA at 1-800-FDA-1088.  Where should I keep my medicine?  Keep out of the reach of children.  If you are using this medicine at home, you will be instructed on how to store it. Throw away any unused medicine after the expiration date on the label.  NOTE: This sheet is a summary. It may not cover all possible information. If you have questions about this medicine, talk to your doctor, pharmacist, or health care provider.   2019 Elsevier/Gold Standard (2017-06-15 16:57:08)

## 2018-05-31 ENCOUNTER — Ambulatory Visit: Payer: Managed Care, Other (non HMO)

## 2018-05-31 ENCOUNTER — Ambulatory Visit (INDEPENDENT_AMBULATORY_CARE_PROVIDER_SITE_OTHER): Payer: Managed Care, Other (non HMO) | Admitting: Bariatrics

## 2018-05-31 VITALS — BP 111/56 | HR 79 | Temp 98.5°F | Ht 65.0 in | Wt 175.0 lb

## 2018-05-31 DIAGNOSIS — E669 Obesity, unspecified: Secondary | ICD-10-CM

## 2018-05-31 DIAGNOSIS — Z170 Estrogen receptor positive status [ER+]: Secondary | ICD-10-CM

## 2018-05-31 DIAGNOSIS — Z9189 Other specified personal risk factors, not elsewhere classified: Secondary | ICD-10-CM | POA: Diagnosis not present

## 2018-05-31 DIAGNOSIS — R7303 Prediabetes: Secondary | ICD-10-CM | POA: Diagnosis not present

## 2018-05-31 DIAGNOSIS — Z6830 Body mass index (BMI) 30.0-30.9, adult: Secondary | ICD-10-CM

## 2018-05-31 DIAGNOSIS — C50912 Malignant neoplasm of unspecified site of left female breast: Secondary | ICD-10-CM

## 2018-06-01 LAB — HEMOGLOBIN A1C
Est. average glucose Bld gHb Est-mCnc: 114 mg/dL
HEMOGLOBIN A1C: 5.6 % (ref 4.8–5.6)

## 2018-06-01 LAB — INSULIN, RANDOM: INSULIN: 4.8 u[IU]/mL (ref 2.6–24.9)

## 2018-06-01 NOTE — Progress Notes (Signed)
Office: [phone]  /  Fax: [phone]   HPI:   Chief Complaint: OBESITY Christy Serrano is here to discuss her progress with her obesity treatment plan. She is on the Category 3 plan and is following her eating plan approximately 0 % of the time. She states she is exercising 0 minutes 0 times per week. Christy Serrano has been doing well overall. She is finishing up her chemotherapy and she will start her radiation therapy in the near future. Christy Serrano states that her taste buds are diminished. Her weight is 175 lb (79.4 kg) today and has had a weight loss of 3 pounds over a period of 4 to 5 weeks since her last visit. She has lost 20 lbs since starting treatment with [place].  Pre-Diabetes Darling has a diagnosis of prediabetes based on her elevated Hgb A1c and was informed this puts her at greater risk of developing diabetes. She is not taking metformin currently and continues to work on diet and exercise to decrease risk of diabetes. She denies polyphagia.  At risk for diabetes Christy Serrano is at higher than average risk for developing diabetes due to her obesity and prediabetes. She currently denies polyuria or polydipsia.  Left Breast Cancer Stage 3 Christy Serrano is finishing her chemotherapy and then she will start radiation therapy. Christy Serrano has diminished taste for certain foods.  ASSESSMENT AND PLAN:  Prediabetes - Plan: Hemoglobin A1c, Insulin, random  Malignant neoplasm of left breast in female, estrogen receptor positive, unspecified site of breast ([place])  At risk for diabetes mellitus  Class 1 obesity with serious comorbidity and body mass index (BMI) of 30.0 to 30.9 in adult, unspecified obesity type  PLAN:  Pre-Diabetes Christy Serrano will continue to work on weight loss, exercise, and decreasing simple carbohydrates in her diet to help decrease the risk of diabetes. She was informed that eating too many simple carbohydrates or too many calories at one sitting increases the likelihood of GI side  effects. We will check Hgb A1c and insulin level and Christy Serrano agreed to follow up with [place] as directed to monitor her progress.  Diabetes risk counseling Christy Serrano was given extended (15 minutes) diabetes prevention counseling today. She is 63 y.o. female and has risk factors for diabetes including obesity and prediabetes. We discussed intensive lifestyle modifications today with an emphasis on weight loss as well as increasing exercise and decreasing simple carbohydrates in her diet.  Left Breast Cancer Stage 3 Christy Serrano will follow up with Oncology/Hematology and she will follow up with our clinic in 2 weeks.  Obesity Christy Serrano is currently in the action stage of change. As such, her goal is to continue with weight loss efforts She has agreed to portion control better and make smarter food choices, such as increase vegetables and decrease simple carbohydrates  Christy Serrano has been instructed to work up to a goal of 150 minutes of combined cardio and strengthening exercise per week for weight loss and overall health benefits. We discussed the following Behavioral Modification Strategies today: increase H2O intake, keeping healthy foods in the home, better snacking choices, increasing lean protein intake, decreasing simple carbohydrates, increasing vegetables and work on meal planning and intentional eating Christy Serrano can use protein shakes occasionally.  Christy Serrano has agreed to follow up with our clinic in 2 weeks. She was informed of the importance of frequent follow up visits to maximize her success with intensive lifestyle modifications for her multiple health conditions.  ALLERGIES: Allergies  Allergen Reactions  . Oxycodone-Acetaminophen Other (See Comments) and Palpitations  tachycardia   . Amoxicillin Rash    DID THE REACTION INVOLVE: Swelling of the face/tongue/throat, SOB, or low BP? No Sudden or severe rash/hives, skin peeling, or the inside of the mouth or nose? No Did it require  medical treatment? No When did it last happen?in her 54's If all above answers are "NO", may proceed with cephalosporin use.     MEDICATIONS: Current Outpatient Medications on File Prior to Visit  Medication Sig Dispense Refill  . acetaminophen (TYLENOL) 500 MG tablet Take 1,000 mg by mouth every 6 (six) hours as needed for mild pain.    Marland Kitchen albuterol (PROVENTIL HFA;VENTOLIN HFA) 108 (90 Base) MCG/ACT inhaler Inhale into the lungs every 6 (six) hours as needed for wheezing or shortness of breath.    . Calcium-Phosphorus-Vitamin D (CITRACAL +D3 PO) Take 1,200 mcg by mouth.    . cholecalciferol (VITAMIN D) 1000 units tablet Take 1,000 Units by mouth daily.    . cholestyramine (QUESTRAN) 4 g packet Take 1 packet (4 g total) by mouth 2 (two) times daily with a meal. 60 each 1  . dexamethasone (DECADRON) 4 MG tablet Take 1 tablet (4 mg total) by mouth daily. Take 1 tablet day before chemo and 1 tablet day after with food 12 tablet 0  . diphenoxylate-atropine (LOMOTIL) 2.5-0.025 MG tablet Take 1 tablet by mouth 4 (four) times daily as needed for diarrhea or loose stools. 30 tablet 3  . estradiol (VIVELLE-DOT) 0.05 MG/24HR patch     . furosemide (LASIX) 20 MG tablet Take 1 tablet (20 mg total) by mouth daily as needed. 30 tablet 1  . lidocaine-prilocaine (EMLA) cream Apply to affected area once 30 g 3  . LORazepam (ATIVAN) 0.5 MG tablet Take 1 tablet (0.5 mg total) by mouth at bedtime as needed (Nausea or vomiting). 30 tablet 0  . Multiple Vitamin (MULTIVITAMIN) capsule Take 1 capsule by mouth daily.    . ondansetron (ZOFRAN) 8 MG tablet Take 1 tablet (8 mg total) by mouth 2 (two) times daily as needed (Nausea or vomiting). Begin 4 days after chemotherapy. 30 tablet 1  . pantoprazole (PROTONIX) 40 MG tablet Take 1 tablet (40 mg total) by mouth daily. 30 tablet 3  . potassium chloride SA (K-DUR,KLOR-CON) 20 MEQ tablet Take 1 tablet (20 mEq total) by mouth daily. 30 tablet 1  . prochlorperazine  (COMPAZINE) 10 MG tablet Take 1 tablet (10 mg total) by mouth every 6 (six) hours as needed (Nausea or vomiting). 30 tablet 1  . traMADol (ULTRAM) 50 MG tablet Take 1-2 tablets (50-100 mg total) by mouth every 6 (six) hours as needed. 20 tablet 0   No current facility-administered medications on file prior to visit.     PAST MEDICAL HISTORY: Past Medical History:  Diagnosis Date  . Cancer [facility])    left breast  . Family history of breast cancer   . Family history of ovarian cancer   . Family history of prostate cancer   . Lactose intolerance   . PONV (postoperative nausea and vomiting)     PAST SURGICAL HISTORY: Past Surgical History:  Procedure Laterality Date  . PORTACATH PLACEMENT Right 03/03/2018   Procedure: INSERTION PORT-A-CATH;  Surgeon: Jovita Kussmaul, MD;  Location: [facility];  Service: General;  Laterality: Right;  . ROTATOR CUFF REPAIR Right 2006  . TOTAL ABDOMINAL HYSTERECTOMY  2010    SOCIAL HISTORY: Social History   Tobacco Use  . Smoking status: Never Smoker  . Smokeless tobacco: Never  Used  Substance Use Topics  . Alcohol use: No    Comment: quit long ago  . Drug use: No    FAMILY HISTORY: Family History  Problem Relation Age of Onset  . High blood pressure Mother   . Heart attack Mother        CHF  . High blood pressure Father   . Prostate cancer Father        dx mid 63s  . Breast cancer Sister 73       negative Invitae 46 gene panel  . Breast cancer Paternal Aunt        dx mid 46s  . Cancer Paternal Uncle        jaw cancer d. 9s  . Ovarian cancer Maternal Grandmother        d. 37s  . Cancer Paternal Grandmother        either stomach or ovarian cancer  . Breast cancer Sister 58       "negative 21 gene panel"    ROS: Review of Systems  Constitutional: Positive for weight loss.  Genitourinary: Negative for frequency.  Endo/Heme/Allergies: Negative for polydipsia.       Negative for polyphagia    PHYSICAL  EXAM: Blood pressure (!) 111/56, pulse 79, temperature 98.5 F (36.9 C), temperature source Oral, height 5\' 5"  (1.651 m), weight 175 lb (79.4 kg), SpO2 100 %. Body mass index is 29.12 kg/m. Physical Exam Vitals signs reviewed.  Constitutional:      Appearance: Normal appearance. She is well-developed. She is obese.  Cardiovascular:     Rate and Rhythm: Normal rate.  Pulmonary:     Effort: Pulmonary effort is normal.  Musculoskeletal: Normal range of motion.  Skin:    General: Skin is warm and dry.  Neurological:     Mental Status: She is alert and oriented to person, place, and time.  Psychiatric:        Mood and Affect: Mood normal.        Behavior: Behavior normal.     RECENT LABS AND TESTS: BMET    Component Value Date/Time   NA 137 05/27/2018 0908   NA 138 12/22/2017 1235   K 4.3 05/27/2018 0908   CL 107 05/27/2018 0908   CO2 24 05/27/2018 0908   GLUCOSE 117 (H) 05/27/2018 0908   BUN 14 05/27/2018 0908   BUN 12 12/22/2017 1235   CREATININE 0.83 05/27/2018 0908   CREATININE 0.90 03/26/2018 0859   CALCIUM 9.0 05/27/2018 0908   GFRNONAA >60 05/27/2018 0908   GFRNONAA >60 03/26/2018 0859   GFRAA >60 05/27/2018 0908   GFRAA >60 03/26/2018 0859   Lab Results  Component Value Date   HGBA1C WILL FOLLOW 05/31/2018   HGBA1C 6.1 (H) 12/22/2017   Lab Results  Component Value Date   INSULIN 4.8 05/31/2018   INSULIN 13.7 12/22/2017   CBC    Component Value Date/Time   WBC 7.2 05/27/2018 0908   RBC 2.78 (L) 05/27/2018 0908   HGB 8.7 (L) 05/27/2018 0908   HGB 9.7 (L) 03/26/2018 0859   HGB 12.3 12/22/2017 1235   HCT 27.8 (L) 05/27/2018 0908   HCT 38.2 12/22/2017 1235   PLT 208 05/27/2018 0908   PLT 306 03/26/2018 0859   MCV 100.0 05/27/2018 0908   MCV 92 12/22/2017 1235   MCH 31.3 05/27/2018 0908   MCHC 31.3 05/27/2018 0908   RDW 20.4 (H) 05/27/2018 0908   RDW 14.8 12/22/2017 1235   LYMPHSABS 1.3 05/27/2018  0908   LYMPHSABS 2.0 12/22/2017 1235   MONOABS  0.1 05/27/2018 0908   EOSABS 0.0 05/27/2018 0908   EOSABS 0.0 12/22/2017 1235   BASOSABS 0.0 05/27/2018 0908   BASOSABS 0.0 12/22/2017 1235   Iron/TIBC/Ferritin/ %Sat No results found for: IRON, TIBC, FERRITIN, IRONPCTSAT Lipid Panel     Component Value Date/Time   CHOL 176 12/22/2017 1235   TRIG 111 12/22/2017 1235   HDL 61 12/22/2017 1235   LDLCALC 93 12/22/2017 1235   Hepatic Function Panel     Component Value Date/Time   PROT 6.6 05/27/2018 0908   PROT 7.6 12/22/2017 1235   ALBUMIN 3.6 05/27/2018 0908   ALBUMIN 4.8 12/22/2017 1235   AST 49 (H) 05/27/2018 0908   AST 20 03/26/2018 0859   ALT 32 05/27/2018 0908   ALT 19 03/26/2018 0859   ALKPHOS 70 05/27/2018 0908   BILITOT 0.3 05/27/2018 0908   BILITOT 0.7 03/26/2018 0859      Component Value Date/Time   TSH 3.644 03/10/2018 0538   TSH 1.930 12/22/2017 1235      OBESITY BEHAVIORAL INTERVENTION VISIT  Today's visit was # 9   Starting weight: 195 lbs Starting date: 12/22/2017 Today's weight : 175 lbs Today's date: 05/31/2018 Total lbs lost to date: 20    05/31/2018  Height 5\' 5"  (1.651 m)  Weight 175 lb (79.4 kg)  BMI (Calculated) 29.12  BLOOD PRESSURE - SYSTOLIC 591  BLOOD PRESSURE - DIASTOLIC 56   Body Fat % 63.8 %  Total Body Water (lbs) 82.8 lbs    ASK: We discussed the diagnosis of obesity with Christy Serrano today and Christy Serrano agreed to give [place] permission to discuss obesity behavioral modification therapy today.  ASSESS: Christy Serrano has the diagnosis of obesity and her BMI today is 29.12 Christy Serrano is in the action stage of change   ADVISE: Christy Serrano was educated on the multiple health risks of obesity as well as the benefit of weight loss to improve her health. She was advised of the need for long term treatment and the importance of lifestyle modifications to improve her current health and to decrease her risk of future health problems.  AGREE: Multiple dietary modification options and treatment  options were discussed and  Christy Serrano agreed to follow the recommendations documented in the above note.  ARRANGE: Christy Serrano was educated on the importance of frequent visits to treat obesity as outlined per CMS and USPSTF guidelines and agreed to schedule her next follow up appointment today.  Christy Serrano Skains, am acting as Location manager for [employer]. Owens Shark, DO  I have reviewed the above documentation for accuracy and completeness, and I agree with the above. -Jearld Lesch, DO

## 2018-06-02 ENCOUNTER — Other Ambulatory Visit: Payer: Self-pay | Admitting: *Deleted

## 2018-06-02 ENCOUNTER — Other Ambulatory Visit (HOSPITAL_COMMUNITY): Payer: Self-pay | Admitting: *Deleted

## 2018-06-02 DIAGNOSIS — C50412 Malignant neoplasm of upper-outer quadrant of left female breast: Secondary | ICD-10-CM

## 2018-06-02 DIAGNOSIS — Z171 Estrogen receptor negative status [ER-]: Principal | ICD-10-CM

## 2018-06-02 DIAGNOSIS — C50812 Malignant neoplasm of overlapping sites of left female breast: Secondary | ICD-10-CM

## 2018-06-02 NOTE — Progress Notes (Signed)
Pt sch for new pt appt 3/12 and needs echo prior, order placed

## 2018-06-03 ENCOUNTER — Other Ambulatory Visit: Payer: Self-pay

## 2018-06-03 ENCOUNTER — Ambulatory Visit (HOSPITAL_COMMUNITY)
Admission: RE | Admit: 2018-06-03 | Discharge: 2018-06-03 | Disposition: A | Discharge status: Home / Self Care | Payer: Managed Care, Other (non HMO) | Source: Ambulatory Visit | Attending: Hematology and Oncology | Admitting: Hematology and Oncology

## 2018-06-03 ENCOUNTER — Ambulatory Visit (HOSPITAL_BASED_OUTPATIENT_CLINIC_OR_DEPARTMENT_OTHER)
Admission: RE | Admit: 2018-06-03 | Discharge: 2018-06-03 | Disposition: A | Discharge status: Home / Self Care | Payer: Managed Care, Other (non HMO) | Source: Ambulatory Visit | Attending: Internal Medicine | Admitting: Internal Medicine

## 2018-06-03 DIAGNOSIS — Z171 Estrogen receptor negative status [ER-]: Secondary | ICD-10-CM

## 2018-06-03 DIAGNOSIS — Z79899 Other long term (current) drug therapy: Secondary | ICD-10-CM | POA: Insufficient documentation

## 2018-06-03 DIAGNOSIS — C50412 Malignant neoplasm of upper-outer quadrant of left female breast: Secondary | ICD-10-CM

## 2018-06-03 DIAGNOSIS — C50812 Malignant neoplasm of overlapping sites of left female breast: Secondary | ICD-10-CM | POA: Diagnosis not present

## 2018-06-03 NOTE — Progress Notes (Signed)
  Echocardiogram 2D Echocardiogram has been performed.  Christy Serrano 06/03/2018, 10:53 AM

## 2018-06-03 NOTE — Addendum Note (Signed)
Encounter addended by: Valeda Malm, RN on: 06/03/2018 10:36 AM  Actions taken: Clinical Note Signed, Charge Capture section accepted, Order list changed, Diagnosis association updated

## 2018-06-03 NOTE — Progress Notes (Signed)
  Echocardiogram 2D Echocardiogram has been performed.  Thayer Inabinet G Lowell Makara 06/03/2018, 9:12 AM

## 2018-06-03 NOTE — Patient Instructions (Signed)
No medication changes today  Your physician recommends that you schedule a follow-up appointment in: 3 months with an ECHO

## 2018-06-03 NOTE — Progress Notes (Signed)
CARDIO-ONCOLOGY CLINIC CONSULT NOTE  Referring Physician: Lindi Adie  Primary Cardiologist: None   HPI:  Christy Serrano is 63 y.o. female with left breast cancer referred by Dr. Lindi Adie for enrollment into the Cardio-Oncology program.   SUMMARY OF ONCOLOGIC HISTORY:       Malignant neoplasm of left female breast ([place])   01/18/2018 Initial Diagnosis    Screening mammogram detected microcalcifications and asymmetry, left breast segmental microcalcifications UOQ 1.5 x 4.3 x 5.4 cm biopsy-proven IDC with DCIS with LV I, grade 3, ER 0%, PR 0%, Ki-67 30%, HER-2 +3+ by IHC; hypoechoic mass 2 o'clock position 1.2 x 1.3 x 1.7 cm biopsy IDC, LV I present, grade 2-3, ER 0%, PR 0%, Ki-67 40%, HER-2 +3+, left axillary lymph node positive (3 nodes noted by [place]) T3 N1 stage IIIA    02/03/2018 Cancer Staging    Staging form: Breast, AJCC 8th Edition - Clinical: Stage IIIA (cT3, cN1, cM0, G3, ER-, PR-, HER2+) - Signed by Nicholas Lose, MD on 02/03/2018    02/20/2018 Genetic Testing    No pathogenic variants identified on the [employer] Cancer[employer] Expanded + RNA insight panel. Genes Analyzed (67 total): AIP, ALK, APC*, ATM*, BAP1, BARD1, BLM, BMPR1A, BRCA1*, BRCA2*, BRIP1*, CDH1*, CDK4, CDKN1B, CDKN2A, CHEK2*, DICER1, FANCC, FH, FLCN, GALNT12, HOXB13, MAX, MEN1, MET, MLH1*, MRE11A, MSH2*, MSH6*, MUTYH*, NBN, NF1*, NF2, PALB2*, PHOX2B, PMS2*, POLD1, POLE, POT1, PRKAR1A, PTCH1, PTEN*, RAD50, RAD51C*, RAD51D*, RB1, RET, SDHA, SDHAF2, SDHB, SDHC, SDHD, SMAD4, SMARCA4, SMARCB1, SMARCE1, STK11, SUFU, TMEM127, TP53*, TSC1, TSC2, VHL and XRCC2 (sequencing and deletion/duplication); MITF (sequencing only); EPCAM and GREM1 (deletion/duplication only). DNA and RNA analyses performed for * genes. The report date is 02/20/2018.    03/04/2018 -  Neo-Adjuvant Chemotherapy    Neoadjuvant chemotherapy with [facility]    03/08/2018 - 03/10/2018 Hospital Admission    Admitted for recurrent syncope with loss of  consciousness for up to 5 minutes with loss of bladder, no abnormalities identified.  MRI brain, carotid ultrasound, EEG studies were negative.     Denies any significant PMHx outside of breast CA. No known heat disease. 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 No CP, SOB, orthopnea or PND. Says she can hear her heart beat when it is quiet and that didn't happen before chemo   Echo today EF 60-65% Grade I DD GLS -23.1% Personally reviewed   Echo 03/02/18  LVEF of 60-65%  Review of Systems: [y] = yes, [ ]  = no   General: Weight gain [ ] ; Weight loss [ ] ; Anorexia [ ] ; Fatigue Blue.Reese ]; Fever [ ] ; Chills [ ] ; Weakness [ ]   Cardiac: Chest pain/pressure [ ] ; Resting SOB [ ] ; Exertional SOB [ ] ; Orthopnea [ ] ; Pedal Edema [ ] ; Palpitations [ ] ; Syncope [ ] ; Presyncope [ ] ; Paroxysmal nocturnal dyspnea[ ]   Pulmonary: Cough [ ] ; Wheezing[ ] ; Hemoptysis[ ] ; Sputum [ ] ; Snoring [ ]   GI: Vomiting[ ] ; Dysphagia[ ] ; Melena[ ] ; Hematochezia [ ] ; Heartburn[ ] ; Abdominal pain [ ] ; Constipation [ ] ; Diarrhea [ ] ; BRBPR [ ]   GU: Hematuria[ ] ; Dysuria [ ] ; Nocturia[ ]   Vascular: Pain in legs with walking [ ] ; Pain in feet with lying flat [ ] ; Non-healing sores [ ] ; Stroke [ ] ; TIA [ ] ; Slurred speech [ ] ;  Neuro: Headaches[ ] ; Vertigo[ ] ; Seizures[ ] ; Paresthesias[ ] ;Blurred vision [ ] ; Diplopia [ ] ; Vision changes [ ]   Ortho/Skin: Arthritis [ ] ; Joint pain [ ] ; Muscle pain [ ] ; Joint swelling [ ] ; Back  Pain [ ] ; Rash [ ]   Psych: Depression[ ] ; Anxiety[ ]   Heme: Bleeding problems [ ] ; Clotting disorders [ ] ; Anemia [ ]   Endocrine: Diabetes [ ] ; Thyroid dysfunction[ ]    Past Medical History:  Diagnosis Date  . Cancer [facility])    left breast  . Family history of breast cancer   . Family history of ovarian cancer   . Family history of prostate cancer   . Lactose intolerance   . PONV (postoperative nausea and vomiting)     Current Outpatient Medications  Medication Sig Dispense Refill  . acetaminophen (TYLENOL)  500 MG tablet Take 1,000 mg by mouth every 6 (six) hours as needed for mild pain.    Marland Kitchen albuterol (PROVENTIL HFA;VENTOLIN HFA) 108 (90 Base) MCG/ACT inhaler Inhale into the lungs every 6 (six) hours as needed for wheezing or shortness of breath.    . Calcium-Phosphorus-Vitamin D (CITRACAL +D3 PO) Take 1,200 mcg by mouth.    . cholecalciferol (VITAMIN D) 1000 units tablet Take 1,000 Units by mouth daily.    . cholestyramine (QUESTRAN) 4 g packet Take 1 packet (4 g total) by mouth 2 (two) times daily with a meal. 60 each 1  . dexamethasone (DECADRON) 4 MG tablet Take 1 tablet (4 mg total) by mouth daily. Take 1 tablet day before chemo and 1 tablet day after with food 12 tablet 0  . diphenoxylate-atropine (LOMOTIL) 2.5-0.025 MG tablet Take 1 tablet by mouth 4 (four) times daily as needed for diarrhea or loose stools. 30 tablet 3  . estradiol (VIVELLE-DOT) 0.05 MG/24HR patch     . furosemide (LASIX) 20 MG tablet Take 1 tablet (20 mg total) by mouth daily as needed. 30 tablet 1  . lidocaine-prilocaine (EMLA) cream Apply to affected area once 30 g 3  . LORazepam (ATIVAN) 0.5 MG tablet Take 1 tablet (0.5 mg total) by mouth at bedtime as needed (Nausea or vomiting). 30 tablet 0  . Multiple Vitamin (MULTIVITAMIN) capsule Take 1 capsule by mouth daily.    . ondansetron (ZOFRAN) 8 MG tablet Take 1 tablet (8 mg total) by mouth 2 (two) times daily as needed (Nausea or vomiting). Begin 4 days after chemotherapy. 30 tablet 1  . pantoprazole (PROTONIX) 40 MG tablet Take 1 tablet (40 mg total) by mouth daily. 30 tablet 3  . potassium chloride SA (K-DUR,KLOR-CON) 20 MEQ tablet Take 1 tablet (20 mEq total) by mouth daily. 30 tablet 1  . prochlorperazine (COMPAZINE) 10 MG tablet Take 1 tablet (10 mg total) by mouth every 6 (six) hours as needed (Nausea or vomiting). 30 tablet 1  . traMADol (ULTRAM) 50 MG tablet Take 1-2 tablets (50-100 mg total) by mouth every 6 (six) hours as needed. 20 tablet 0   No current  facility-administered medications for this encounter.     Allergies  Allergen Reactions  . Oxycodone-Acetaminophen Other (See Comments) and Palpitations    tachycardia   . Amoxicillin Rash    DID THE REACTION INVOLVE: Swelling of the face/tongue/throat, SOB, or low BP? No Sudden or severe rash/hives, skin peeling, or the inside of the mouth or nose? No Did it require medical treatment? No When did it last happen?in her 13's If all above answers are "NO", may proceed with cephalosporin use.       Social History   Socioeconomic History  . Marital status: Single    Spouse name: Not on file  . Number of children: 0  . Years of education: 23  . Highest  education level: Not on file  Occupational History  . Occupation: Quarry manager    Comment: [employer]  . Financial resource strain: Not on file  . Food insecurity:    Worry: Not on file    Inability: Not on file  . Transportation needs:    Medical: No    Non-medical: No  Tobacco Use  . Smoking status: Never Smoker  . Smokeless tobacco: Never Used  Substance and Sexual Activity  . Alcohol use: No    Comment: quit long ago  . Drug use: No  . Sexual activity: Not on file  Lifestyle  . Physical activity:    Days per week: Not on file    Minutes per session: Not on file  . Stress: Not on file  Relationships  . Social connections:    Talks on phone: Not on file    Gets together: Not on file    Attends religious service: Not on file    Active member of club or organization: Not on file    Attends meetings of clubs or organizations: Not on file    Relationship status: Not on file  . Intimate partner violence:    Fear of current or ex partner: No    Emotionally abused: No    Physically abused: No    Forced sexual activity: No  Other Topics Concern  . Not on file  Social History Narrative   Lives with sister   Caffeine- rarely      Family History  Problem Relation Age of Onset  . High blood  pressure Mother   . Heart attack Mother        CHF  . High blood pressure Father   . Prostate cancer Father        dx mid 71s  . Breast cancer Sister 55       negative Invitae 46 gene panel  . Breast cancer Paternal Aunt        dx mid 55s  . Cancer Paternal Uncle        jaw cancer d. 44s  . Ovarian cancer Maternal Grandmother        d. 36s  . Cancer Paternal Grandmother        either stomach or ovarian cancer  . Breast cancer Sister 35       "negative 21 gene panel"    There were no vitals filed for this visit.  PHYSICAL EXAM: General:  Well appearing. No respiratory difficulty HEENT: normal Neck: supple. no JVD. Carotids 2+ bilat; no bruits. No lymphadenopathy or thryomegaly appreciated. Cor: PMI nondisplaced. Regular rate & rhythm. No rubs, gallops or murmurs. R port  Lungs: clear Abdomen: soft, nontender, nondistended. No hepatosplenomegaly. No bruits or masses. Good bowel sounds. Extremities: no cyanosis, clubbing, rash, edema Neuro: alert & oriented x 3, cranial nerves grossly intact. moves all 4 extremities w/o difficulty. Affect pleasant.   ASSESSMENT & PLAN: 1. Left Breast Cancer - Explained incidence of Herceptin cardiotoxicity and role of Cardio-oncology clinic at length. Echo images reviewed personally. All parameters stable. Reviewed signs and symptoms of HF to look for. Continue Herceptin. Follow-up with echo in 3 months.   Glori Bickers, MD  10:11 AM

## 2018-06-07 ENCOUNTER — Encounter (INDEPENDENT_AMBULATORY_CARE_PROVIDER_SITE_OTHER): Payer: Self-pay | Admitting: Bariatrics

## 2018-06-07 ENCOUNTER — Other Ambulatory Visit: Payer: Self-pay | Admitting: Medical

## 2018-06-07 ENCOUNTER — Encounter: Payer: Self-pay | Admitting: Adult Health

## 2018-06-07 ENCOUNTER — Telehealth: Payer: Self-pay | Admitting: Emergency Medicine

## 2018-06-07 DIAGNOSIS — C50812 Malignant neoplasm of overlapping sites of left female breast: Secondary | ICD-10-CM

## 2018-06-07 DIAGNOSIS — R059 Cough, unspecified: Secondary | ICD-10-CM

## 2018-06-07 DIAGNOSIS — R05 Cough: Secondary | ICD-10-CM

## 2018-06-07 DIAGNOSIS — Z171 Estrogen receptor negative status [ER-]: Principal | ICD-10-CM

## 2018-06-07 NOTE — Telephone Encounter (Signed)
Following up on pt's message asking to be re-evaluated for her continuing cough and sinus pressure/drainage.  CXR order placed for 06/08/2018, pt knows to come around 10 am for scan then to check in for [facility] appt after she's finished.  PA Lucianne Lei aware.

## 2018-06-08 ENCOUNTER — Ambulatory Visit (HOSPITAL_COMMUNITY)
Admission: RE | Admit: 2018-06-08 | Discharge: 2018-06-08 | Disposition: A | Discharge status: Home / Self Care | Payer: Managed Care, Other (non HMO) | Source: Ambulatory Visit | Attending: Medical | Admitting: Medical

## 2018-06-08 ENCOUNTER — Other Ambulatory Visit: Payer: Self-pay

## 2018-06-08 ENCOUNTER — Inpatient Hospital Stay (HOSPITAL_BASED_OUTPATIENT_CLINIC_OR_DEPARTMENT_OTHER): Payer: Managed Care, Other (non HMO) | Admitting: Medical

## 2018-06-08 VITALS — BP 127/60 | HR 94 | Temp 97.6°F | Resp 18 | Ht 65.0 in | Wt 181.4 lb

## 2018-06-08 DIAGNOSIS — G629 Polyneuropathy, unspecified: Secondary | ICD-10-CM | POA: Diagnosis not present

## 2018-06-08 DIAGNOSIS — R05 Cough: Secondary | ICD-10-CM | POA: Insufficient documentation

## 2018-06-08 DIAGNOSIS — Z171 Estrogen receptor negative status [ER-]: Secondary | ICD-10-CM | POA: Diagnosis not present

## 2018-06-08 DIAGNOSIS — C50812 Malignant neoplasm of overlapping sites of left female breast: Secondary | ICD-10-CM | POA: Diagnosis not present

## 2018-06-08 DIAGNOSIS — K219 Gastro-esophageal reflux disease without esophagitis: Secondary | ICD-10-CM

## 2018-06-08 DIAGNOSIS — J45909 Unspecified asthma, uncomplicated: Secondary | ICD-10-CM

## 2018-06-08 DIAGNOSIS — R059 Cough, unspecified: Secondary | ICD-10-CM

## 2018-06-08 DIAGNOSIS — G620 Drug-induced polyneuropathy: Secondary | ICD-10-CM

## 2018-06-08 DIAGNOSIS — Z79899 Other long term (current) drug therapy: Secondary | ICD-10-CM

## 2018-06-08 DIAGNOSIS — C50412 Malignant neoplasm of upper-outer quadrant of left female breast: Secondary | ICD-10-CM

## 2018-06-08 MED ORDER — BENZONATATE 200 MG PO CAPS: 200.0000 mg | ORAL_CAPSULE | Freq: Three times a day (TID) | ORAL | 2 refills | Status: DC

## 2018-06-08 MED ORDER — GABAPENTIN 300 MG PO CAPS: 300.0000 mg | ORAL_CAPSULE | Freq: Every day | ORAL | 5 refills | Status: DC

## 2018-06-08 MED ORDER — AZITHROMYCIN 250 MG PO TABS: ORAL_TABLET | ORAL | 0 refills | Status: DC

## 2018-06-08 MED FILL — AZITHROMYCIN 250 MG TABLET: 250 | 5 days supply | Qty: 6 | Fill #0

## 2018-06-08 MED FILL — BENZONATATE 200 MG CAPS: 200 | 7 days supply | Qty: 21 | Fill #0

## 2018-06-08 MED FILL — GABAPENTIN 300 MG CAPSULE: 300 | 30 days supply | Qty: 30 | Fill #0

## 2018-06-08 NOTE — Patient Instructions (Signed)
Chest X-Ray  A chest X-ray is a painless test that uses radiation to create images of the structures inside of your chest. Chest X-rays are used to look for many health conditions, including heart failure, pneumonia, tuberculosis, rib fractures, breathing disorders, and cancer. They may be used to diagnose chest pain, constant coughing, or trouble breathing. Tell a health care provider about:  Any allergies you have.  All medicines you are taking, including vitamins, herbs, eye drops, creams, and over-the-counter medicines.  Any surgeries you have had.  Any medical conditions you have.  Whether you are pregnant or may be pregnant. What are the risks? Getting a chest X-ray is a safe procedure. However, you will be exposed to a small amount of radiation. Being exposed to too much radiation over a lifetime can increase the risk of cancer. This risk is small, but it may occur if you have many X-rays throughout your life. What happens before the procedure?  You may be asked to remove glasses, jewelry, and any other metal objects.  You will be asked to undress from the waist up. You may be given a hospital gown to wear.  You may be asked to wear a protective lead apron to protect parts of your body from radiation. What happens during the procedure?  You will be asked to stand still as each picture is taken to get the best possible images.  You will be asked to take a deep breath and hold your breath for a few seconds.  The X-ray machine will create a picture of your chest using a tiny burst of radiation. This is painless.  More pictures may be taken from other angles. Typically, one picture will be taken while you face the X-ray camera, and another picture will be taken from the side while you stand. If you cannot stand, you may be asked to lie down. The procedure may vary among health care providers and hospitals. What happens after the procedure?  The X-ray(s) will be reviewed by your  health care provider or an X-ray (radiology) specialist.  It is up to you to get your test results. Ask your health care provider, or the department that is doing the test, when your results will be ready.  Your health care provider will tell you if you need more tests or a follow-up exam. Keep all follow-up visits as told by your health care provider. This is important. Summary  A chest X-ray is a safe, painless test that is used to examine the inside of the chest, heart, and lungs.  You will need to undress from the waist up and remove jewelry and metal objects before the procedure.  You will be exposed to a small amount of radiation during the procedure.  The X-ray machine will take one or more pictures of your chest while you remain as still as possible.  Later, a health care provider or specialist will review the test results with you. This information is not intended to replace advice given to you by your health care provider. Make sure you discuss any questions you have with your health care provider. Document Released: 05/06/2016 Document Revised: 05/06/2016 Document Reviewed: 05/06/2016 Elsevier Interactive Patient Education  2019 [employer] Inc[employer]  

## 2018-06-09 NOTE — Progress Notes (Signed)
[facility] Progress Note   Christy Serrano [id number] 30-Jun-1955 63 y.o.  Christy Serrano is managed by Dr. Nicholas Lose  Actively treated with chemotherapy/immunotherapy/hormonal therapy: yes  Current therapy:  TCHP  Last treated: 05/27/2018 (cycle 5)  Next scheduled appointment with provider: 06/17/2018.  Assessment: Plan:    Malignant neoplasm of overlapping sites of left breast in female, estrogen receptor negative ([place])  Reactive airway disease without complication, unspecified asthma severity, unspecified whether persistent  Cough  Gastroesophageal reflux disease, esophagitis presence not specified  Drug-induced polyneuropathy ([place])   ER negative malignant neoplasm of the left breast: The patient continues to be followed by Dr. Nicholas Lose and is status post cycle 5 of [place] P which was dosed on 05/27/2018.  Cough in the setting of reactive airway disease: The patient was given a prescription for a Z-Pak and Tessalon Perles.  She was also instructed to increase her Protonix to twice daily dosing.  A chest x-ray was completed which returned negative for pathology.   GERD: The patient has a history of GERD for which she takes Protonix once daily.  Despite this she continues to have breakthrough.  Based on this and her ongoing cough she was instructed to increase her Protonix to twice daily dosing.  Peripheral neuropathy.  The patient has had her docetaxel dose reduced previously.  Despite this she continues to have pain in her fingers and toes.  She was begun on gabapentin 300 mg p.o. nightly.  Please see After Visit Summary for patient specific instructions.  Future Appointments  Date Time Provider [place]  06/16/2018  4:40 PM Jearld Lesch A, DO [facility] None  06/17/2018  8:15 AM [facility] LAB 2 [facility] None  06/17/2018  8:30 AM [facility] [place] FLUSH [facility] None  06/17/2018  9:00 AM Nicholas Lose, MD [facility] None  06/17/2018 10:00 AM  [facility] INFUSION [facility] None  06/17/2018  5:00 PM [facility] 1 [facility] [place]  06/19/2018 11:15 AM [facility] [place] FLUSH [facility] None  06/19/2018 12:45 PM [facility] [place] FLUSH [facility] None  06/23/2018  1:45 PM [facility] LAB 1 [facility] None  06/23/2018  2:15 PM Nicholas Lose, MD [facility] None  09/03/2018  9:00 AM [facility] ECHO 1-BUZZ [facility]  09/03/2018 10:00 AM Bensimhon, Shaune Pascal, MD [facility] None    No orders of the defined types were placed in this encounter.      Subjective:   Patient ID:  Christy Serrano is a 63 y.o. (DOB 05-Apr-1955) female.  Chief Complaint:  Chief Complaint  Patient presents with   Cough    HPI Christy Serrano is a 63 year old female with a diagnosis of an ER negative malignant neoplasm of the left breast. She continues to be followed by Dr. Nicholas Lose and is status post cycle 5 of [place] P which was dosed on 05/27/2018.  She presents to the clinic today with an ongoing cough in the setting of reactive airway disease.  She was referred for a chest x-ray which returned showing no abnormal findings.  She has a history of GERD for which she takes Protonix once daily.  Despite this she continues to have breakthrough reflux.  She has has pain in her fingers and toes.  She is having hyperpigmentation of her nails.  Medications: I have reviewed the patient's current medications.  Allergies:  Allergies  Allergen Reactions   Oxycodone-Acetaminophen Other (See Comments) and Palpitations    tachycardia    Amoxicillin Rash    DID THE REACTION INVOLVE:  Swelling of the face/tongue/throat, SOB, or low BP? No Sudden or severe rash/hives, skin peeling, or the inside of the mouth or nose? No Did it require medical treatment? No When did it last happen?in her 83's If all above answers are NO, may proceed with cephalosporin use.     Past Medical History:  Diagnosis Date   Cancer ([place])    left breast   Family history of breast cancer     Family history of ovarian cancer    Family history of prostate cancer    Lactose intolerance    PONV (postoperative nausea and vomiting)     Past Surgical History:  Procedure Laterality Date   PORTACATH PLACEMENT Right 03/03/2018   Procedure: INSERTION PORT-A-CATH;  Surgeon: Jovita Kussmaul, MD;  Location: [place];  Service: General;  Laterality: Right;   ROTATOR CUFF REPAIR Right 2006   TOTAL ABDOMINAL HYSTERECTOMY  2010    Family History  Problem Relation Age of Onset   High blood pressure Mother    Heart attack Mother        CHF   High blood pressure Father    Prostate cancer Father        dx mid 79s   Breast cancer Sister 8       negative [employer] 79 gene panel   Breast cancer Paternal Aunt        dx mid 18s   Cancer Paternal Uncle        jaw cancer d. 42s   Ovarian cancer Maternal Grandmother        d. 26s   Cancer Paternal Grandmother        either stomach or ovarian cancer   Breast cancer Sister 39       "negative 21 gene panel"    Social History   Socioeconomic History   Marital status: Single    Spouse name: Not on file   Number of children: 0   Years of education: 13   Highest education level: Not on file  Occupational History   Occupation: Quarry manager    Comment: Passenger transport manager strain: Not on file   Food insecurity:    Worry: Not on file    Inability: Not on file   Transportation needs:    Medical: No    Non-medical: No  Tobacco Use   Smoking status: Never Smoker   Smokeless tobacco: Never Used  Substance and Sexual Activity   Alcohol use: No    Comment: quit long ago   Drug use: No   Sexual activity: Not on file  Lifestyle   Physical activity:    Days per week: Not on file    Minutes per session: Not on file   Stress: Not on file  Relationships   Social connections:    Talks on phone: Not on file    Gets together: Not on file    Attends religious service: Not  on file    Active member of club or organization: Not on file    Attends meetings of clubs or organizations: Not on file    Relationship status: Not on file   Intimate partner violence:    Fear of current or ex partner: No    Emotionally abused: No    Physically abused: No    Forced sexual activity: No  Other Topics Concern   Not on file  Social History Narrative   Lives with sister  Caffeine- rarely    Past Medical History, Surgical history, Social history, and Family history were reviewed and updated as appropriate.   Please see review of systems for further details on the patient's review from today.   Review of Systems:  Review of Systems  Constitutional: Negative for chills, diaphoresis, fatigue and fever.  HENT: Negative for congestion, postnasal drip, rhinorrhea and sore throat.   Respiratory: Positive for cough. Negative for shortness of breath and wheezing.   Cardiovascular: Negative for palpitations.  Gastrointestinal:       GERD  Skin:       Hyperpigmentation of the nailbeds.  Neurological: Negative for headaches.       Pain of the fingers and toes.    Objective:   Physical Exam:  BP 127/60 (BP Location: Right Arm, Patient Position: Sitting)    Pulse 94    Temp 97.6 F (36.4 C) (Oral)    Resp 18    Ht 5\' 5"  (1.651 m)    Wt 181 lb 6.4 oz (82.3 kg)    SpO2 100%    BMI 30.19 kg/m  ECOG: 0  Physical Exam Constitutional:      General: She is not in acute distress.    Appearance: She is not diaphoretic.  HENT:     Head: Normocephalic and atraumatic.     Right Ear: External ear normal.     Left Ear: External ear normal.     Mouth/Throat:     Pharynx: No oropharyngeal exudate.  Neck:     Musculoskeletal: Normal range of motion and neck supple.  Cardiovascular:     Rate and Rhythm: Normal rate and regular rhythm.     Heart sounds: Normal heart sounds. No murmur. No friction rub. No gallop.   Pulmonary:     Effort: Pulmonary effort is normal. No  respiratory distress.     Breath sounds: Normal breath sounds. No wheezing or rales.  Lymphadenopathy:     Cervical: No cervical adenopathy.  Skin:    General: Skin is warm and dry.     Findings: No erythema or rash.     Comments: Fingernails and hands are hyperpigmented.  Neurological:     Mental Status: She is alert.     Coordination: Coordination normal.  Psychiatric:        Behavior: Behavior normal.        Thought Content: Thought content normal.        Judgment: Judgment normal.     Lab Review:     Component Value Date/Time   NA 137 05/27/2018 0908   NA 138 12/22/2017 1235   K 4.3 05/27/2018 0908   CL 107 05/27/2018 0908   CO2 24 05/27/2018 0908   GLUCOSE 117 (H) 05/27/2018 0908   BUN 14 05/27/2018 0908   BUN 12 12/22/2017 1235   CREATININE 0.83 05/27/2018 0908   CREATININE 0.90 03/26/2018 0859   CALCIUM 9.0 05/27/2018 0908   PROT 6.6 05/27/2018 0908   PROT 7.6 12/22/2017 1235   ALBUMIN 3.6 05/27/2018 0908   ALBUMIN 4.8 12/22/2017 1235   AST 49 (H) 05/27/2018 0908   AST 20 03/26/2018 0859   ALT 32 05/27/2018 0908   ALT 19 03/26/2018 0859   ALKPHOS 70 05/27/2018 0908   BILITOT 0.3 05/27/2018 0908   BILITOT 0.7 03/26/2018 0859   GFRNONAA >60 05/27/2018 0908   GFRNONAA >60 03/26/2018 0859   GFRAA >60 05/27/2018 0908   GFRAA >60 03/26/2018 0859       Component  Value Date/Time   WBC 7.2 05/27/2018 0908   RBC 2.78 (L) 05/27/2018 0908   HGB 8.7 (L) 05/27/2018 0908   HGB 9.7 (L) 03/26/2018 0859   HGB 12.3 12/22/2017 1235   HCT 27.8 (L) 05/27/2018 0908   HCT 38.2 12/22/2017 1235   PLT 208 05/27/2018 0908   PLT 306 03/26/2018 0859   MCV 100.0 05/27/2018 0908   MCV 92 12/22/2017 1235   MCH 31.3 05/27/2018 0908   MCHC 31.3 05/27/2018 0908   RDW 20.4 (H) 05/27/2018 0908   RDW 14.8 12/22/2017 1235   LYMPHSABS 1.3 05/27/2018 0908   LYMPHSABS 2.0 12/22/2017 1235   MONOABS 0.1 05/27/2018 0908   EOSABS 0.0 05/27/2018 0908   EOSABS 0.0 12/22/2017 1235    BASOSABS 0.0 05/27/2018 0908   BASOSABS 0.0 12/22/2017 1235   -------------------------------  Imaging from last 24 hours (if applicable):  Radiology interpretation: Dg Chest 2 View  Result Date: 06/08/2018 CLINICAL DATA:  Breast cancer with ongoing cough EXAM: CHEST - 2 VIEW COMPARISON:  03/03/2018 FINDINGS: Porta catheter with tip at the SVC. Normal heart size and mediastinal contours. Improved aeration since prior. There is no edema, consolidation, effusion, or pneumothorax. IMPRESSION: No evidence of active disease. Electronically Signed   By: Monte Fantasia M.D.   On: 06/08/2018 10:46   Vas [place] Lower Extremity Venous (dvt)  Result Date: 05/10/2018  Lower Venous Study Indications: Swelling.  Performing Technologist: Maudry Mayhew MHA, RDMS, RVT, RDCS  Examination Guidelines: A complete evaluation includes B-mode imaging, spectral Doppler, color Doppler, and power Doppler as needed of all accessible portions of each vessel. Bilateral testing is considered an integral part of a complete examination. Limited examinations for reoccurring indications may be performed as noted.  Right Venous Findings: +---------+---------------+---------+-----------+----------+--------------+            Compressibility Phasicity Spontaneity Properties Summary         +---------+---------------+---------+-----------+----------+--------------+  CFV       Full            No        Yes                    Pulsatile flow  +---------+---------------+---------+-----------+----------+--------------+  SFJ       Full                                                             +---------+---------------+---------+-----------+----------+--------------+  FV Prox   Full                                                             +---------+---------------+---------+-----------+----------+--------------+  FV Mid    Full                                                              +---------+---------------+---------+-----------+----------+--------------+  FV Distal Full                                                             +---------+---------------+---------+-----------+----------+--------------+  PFV       Full                                                             +---------+---------------+---------+-----------+----------+--------------+  POP       Full            No        Yes                    Pulsatile flow  +---------+---------------+---------+-----------+----------+--------------+  PTV       Full                                                             +---------+---------------+---------+-----------+----------+--------------+  PERO      Full                                                             +---------+---------------+---------+-----------+----------+--------------+  Left Venous Findings: +---------+---------------+---------+-----------+----------+--------------+            Compressibility Phasicity Spontaneity Properties Summary         +---------+---------------+---------+-----------+----------+--------------+  CFV       Full            No        Yes                    Pulastile flow  +---------+---------------+---------+-----------+----------+--------------+  SFJ       Full                                                             +---------+---------------+---------+-----------+----------+--------------+  FV Prox   Full                                                             +---------+---------------+---------+-----------+----------+--------------+  FV Mid    Full                                                             +---------+---------------+---------+-----------+----------+--------------+  FV Distal Full                                                             +---------+---------------+---------+-----------+----------+--------------+  PFV       Full                                                              +---------+---------------+---------+-----------+----------+--------------+  POP       Full            No        Yes                    Pulsatile flow  +---------+---------------+---------+-----------+----------+--------------+  PTV       Full                                                             +---------+---------------+---------+-----------+----------+--------------+  PERO      Full                                                             +---------+---------------+---------+-----------+----------+--------------+    Summary: Right: There is no evidence of deep vein thrombosis in the lower extremity. No cystic structure found in the popliteal fossa. Left: There is no evidence of deep vein thrombosis in the lower extremity. No cystic structure found in the popliteal fossa. Pulsatile venous flow is suggestive of elevated right-sided heart pressure.  *See table(s) above for measurements and observations. Electronically signed by Monica Martinez MD on 05/10/2018 at 5:17:53 PM.    Final

## 2018-06-11 ENCOUNTER — Other Ambulatory Visit: Payer: Self-pay | Admitting: *Deleted

## 2018-06-11 ENCOUNTER — Other Ambulatory Visit: Payer: Self-pay

## 2018-06-11 DIAGNOSIS — Z171 Estrogen receptor negative status [ER-]: Principal | ICD-10-CM

## 2018-06-11 DIAGNOSIS — C50412 Malignant neoplasm of upper-outer quadrant of left female breast: Secondary | ICD-10-CM

## 2018-06-11 MED ORDER — PANTOPRAZOLE SODIUM 40 MG PO TBEC: 40.0000 mg | DELAYED_RELEASE_TABLET | Freq: Every day | ORAL | 0 refills | Status: DC

## 2018-06-11 MED ORDER — PANTOPRAZOLE SODIUM 40 MG PO TBEC: 40.0000 mg | DELAYED_RELEASE_TABLET | Freq: Every day | ORAL | 3 refills | Status: DC

## 2018-06-15 ENCOUNTER — Encounter (INDEPENDENT_AMBULATORY_CARE_PROVIDER_SITE_OTHER): Payer: Self-pay

## 2018-06-16 ENCOUNTER — Encounter (INDEPENDENT_AMBULATORY_CARE_PROVIDER_SITE_OTHER): Payer: Self-pay

## 2018-06-16 ENCOUNTER — Ambulatory Visit (INDEPENDENT_AMBULATORY_CARE_PROVIDER_SITE_OTHER): Payer: Managed Care, Other (non HMO) | Admitting: Bariatrics

## 2018-06-16 NOTE — Progress Notes (Signed)
Patient Care Team: Velna Hatchet, MD as PCP - General (Internal Medicine)  DIAGNOSIS:    ICD-10-CM   1. Malignant neoplasm of overlapping sites of left breast in female, estrogen receptor negative ([place]) C50.812    Z17.1     SUMMARY OF ONCOLOGIC HISTORY:   Malignant neoplasm of left female breast ([place])   01/18/2018 Initial Diagnosis    Screening mammogram detected microcalcifications and asymmetry, left breast segmental microcalcifications UOQ 1.5 x 4.3 x 5.4 cm biopsy-proven IDC with DCIS with LV I, grade 3, ER 0%, PR 0%, Ki-67 30%, HER-2 +3+ by IHC; hypoechoic mass 2 o'clock position 1.2 x 1.3 x 1.7 cm biopsy IDC, LV I present, grade 2-3, ER 0%, PR 0%, Ki-67 40%, HER-2 +3+, left axillary lymph node positive (3 nodes noted by [place]) T3 N1 stage IIIA    02/03/2018 Cancer Staging    Staging form: Breast, AJCC 8th Edition - Clinical: Stage IIIA (cT3, cN1, cM0, G3, ER-, PR-, HER2+) - Signed by Nicholas Lose, MD on 02/03/2018    02/20/2018 Genetic Testing    No pathogenic variants identified on the [facility] Expanded + RNA insight panel. Genes Analyzed (67 total): AIP, ALK, APC*, ATM*, BAP1, BARD1, BLM, BMPR1A, BRCA1*, BRCA2*, BRIP1*, CDH1*, CDK4, CDKN1B, CDKN2A, CHEK2*, DICER1, FANCC, FH, FLCN, GALNT12, HOXB13, MAX, MEN1, MET, MLH1*, MRE11A, MSH2*, MSH6*, MUTYH*, NBN, NF1*, NF2, PALB2*, PHOX2B, PMS2*, POLD1, POLE, POT1, PRKAR1A, PTCH1, PTEN*, RAD50, RAD51C*, RAD51D*, RB1, RET, SDHA, SDHAF2, SDHB, SDHC, SDHD, SMAD4, SMARCA4, SMARCB1, SMARCE1, STK11, SUFU, TMEM127, TP53*, TSC1, TSC2, VHL and XRCC2 (sequencing and deletion/duplication); MITF (sequencing only); EPCAM and GREM1 (deletion/duplication only). DNA and RNA analyses performed for * genes. The report date is 02/20/2018.    03/04/2018 -  Neo-Adjuvant Chemotherapy    Neoadjuvant chemotherapy with [facility] Perjeta    03/08/2018 - 03/10/2018 Hospital Admission    Admitted for recurrent syncope with loss of consciousness for up to 5  minutes with loss of bladder, no abnormalities identified.  MRI brain, carotid ultrasound, EEG studies were negative.     CHIEF COMPLIANT: Cycle 6 TCHP  INTERVAL HISTORY: Christy Serrano is a 63 y.o. with above-mentioned history of left breast cancer currently on neoadjuvant chemotherapy with TCHP. An ECHO from 06/03/18 showed an ejection fraction in the range of 60-65%. She presents to the clinic alone today for cycle 6. She tolerated her last treatment well but notes watering eyes and numbness and tingling in her fingers and toes for which gabapentin has been helpful. She denies dropping objects, stumbling, or difficulty buttoning her shirt but has trouble opening soda cans. She notes swelling in her bilateral feet, worse during the day than at night, which improved one week after treatment with lasix once daily, but worsened when she stopped taking the lasix and has not improved since she has started taking it again. She reports brittle and darkening nails. Her labs from today show: WBC 4.5, Hg 8.5, platelets 195, ANC 2.2.  REVIEW OF SYSTEMS:   Constitutional: Denies fevers, chills or abnormal weight loss (+) darkening nails Eyes: Denies blurriness of vision (+) watering eyes Ears, nose, mouth, throat, and face: Denies mucositis or sore throat Respiratory: Denies cough, dyspnea or wheezes Cardiovascular: Denies palpitation, chest discomfort Gastrointestinal: Denies nausea, heartburn or change in bowel habits Skin: Denies abnormal skin rashes Lymphatics: Denies new lymphadenopathy or easy bruising Neurological: Denies new weaknesses (+) numbness, tingling in fingers and toes Behavioral/Psych: Mood is stable, no new changes  Extremities: (+) bilateral lower extremity edema Breast:  denies any pain or lumps or nodules in either breasts All other systems were reviewed with the patient and are negative.  I have reviewed the past medical history, past surgical history, social history and family  history with the patient and they are unchanged from previous note.  ALLERGIES:  is allergic to oxycodone-acetaminophen and amoxicillin.  MEDICATIONS:  Current Outpatient Medications  Medication Sig Dispense Refill  . acetaminophen (TYLENOL) 500 MG tablet Take 1,000 mg by mouth every 6 (six) hours as needed for mild pain.    Marland Kitchen albuterol (PROVENTIL HFA;VENTOLIN HFA) 108 (90 Base) MCG/ACT inhaler Inhale into the lungs every 6 (six) hours as needed for wheezing or shortness of breath.    Marland Kitchen azithromycin (ZITHROMAX Z-PAK) 250 MG tablet 2 tablets day 1 and then 1 tablet days 2 through 5 6 each 0  . benzonatate (TESSALON) 200 MG capsule Take 1 capsule (200 mg total) by mouth 3 (three) times daily. 21 capsule 2  . Calcium-Phosphorus-Vitamin D (CITRACAL +D3 PO) Take 1,200 mcg by mouth.    . cholecalciferol (VITAMIN D) 1000 units tablet Take 1,000 Units by mouth daily.    . cholestyramine (QUESTRAN) 4 g packet Take 1 packet (4 g total) by mouth 2 (two) times daily with a meal. 60 each 1  . dexamethasone (DECADRON) 4 MG tablet Take 1 tablet (4 mg total) by mouth daily. Take 1 tablet day before chemo and 1 tablet day after with food 12 tablet 0  . diphenoxylate-atropine (LOMOTIL) 2.5-0.025 MG tablet Take 1 tablet by mouth 4 (four) times daily as needed for diarrhea or loose stools. 30 tablet 3  . estradiol (VIVELLE-DOT) 0.05 MG/24HR patch     . furosemide (LASIX) 20 MG tablet Take 1 tablet (20 mg total) by mouth daily as needed. 30 tablet 1  . gabapentin (NEURONTIN) 300 MG capsule Take 1 capsule (300 mg total) by mouth at bedtime. 30 capsule 5  . lidocaine-prilocaine (EMLA) cream Apply to affected area once 30 g 3  . LORazepam (ATIVAN) 0.5 MG tablet Take 1 tablet (0.5 mg total) by mouth at bedtime as needed (Nausea or vomiting). 30 tablet 0  . Multiple Vitamin (MULTIVITAMIN) capsule Take 1 capsule by mouth daily.    . ondansetron (ZOFRAN) 8 MG tablet Take 1 tablet (8 mg total) by mouth 2 (two) times daily  as needed (Nausea or vomiting). Begin 4 days after chemotherapy. 30 tablet 1  . pantoprazole (PROTONIX) 40 MG tablet Take 1 tablet (40 mg total) by mouth daily. 90 tablet 0  . potassium chloride SA (K-DUR,KLOR-CON) 20 MEQ tablet Take 1 tablet (20 mEq total) by mouth daily. 30 tablet 1  . prochlorperazine (COMPAZINE) 10 MG tablet Take 1 tablet (10 mg total) by mouth every 6 (six) hours as needed (Nausea or vomiting). 30 tablet 1  . traMADol (ULTRAM) 50 MG tablet Take 1-2 tablets (50-100 mg total) by mouth every 6 (six) hours as needed. 20 tablet 0   No current facility-administered medications for this visit.     PHYSICAL EXAMINATION: ECOG PERFORMANCE STATUS: 2 - Symptomatic, <50% confined to bed  Vitals:   06/17/18 0919  BP: (!) 114/53  Pulse: 81  Resp: 18  Temp: 97.9 F (36.6 C)  SpO2: 100%   Filed Weights   06/17/18 0919  Weight: 184 lb 4.8 oz (83.6 kg)    GENERAL: alert, no distress and comfortable SKIN: skin color, texture, turgor are normal, no rashes or significant lesions EYES: normal, Conjunctiva are pink and non-injected, sclera  clear OROPHARYNX: no exudate, no erythema and lips, buccal mucosa, and tongue normal  NECK: supple, thyroid normal size, non-tender, without nodularity LYMPH: no palpable lymphadenopathy in the cervical, axillary or inguinal LUNGS: clear to auscultation and percussion with normal breathing effort HEART: regular rate & rhythm and no murmurs and no lower extremity edema ABDOMEN: abdomen soft, non-tender and normal bowel sounds MUSCULOSKELETAL: no cyanosis of digits and no clubbing  NEURO: alert & oriented x 3 with fluent speech, no focal motor/sensory deficits EXTREMITIES: No lower extremity edema  LABORATORY DATA:  I have reviewed the data as listed CMP Latest Ref Rng & Units 06/17/2018 05/27/2018 05/07/2018  Glucose 70 - 99 mg/dL 92 117(H) 115(H)  BUN 6 - 20 mg/dL 12 14 14   Creatinine 0.44 - 1.00 mg/dL 0.82 0.83 0.88  Sodium 135 - 145 mmol/L  139 137 138  Potassium 3.5 - 5.1 mmol/L 3.9 4.3 4.5  Chloride 98 - 111 mmol/L 105 107 106  CO2 22 - 32 mmol/L 27 24 23   Calcium 8.9 - 10.3 mg/dL 7.9(L) 9.0 9.2  Total Protein 6.5 - 8.1 g/dL 6.2(L) 6.6 6.8  Total Bilirubin 0.3 - 1.2 mg/dL 0.6 0.3 0.6  Alkaline Phos 38 - 126 U/L 71 70 94  AST 15 - 41 U/L 19 49(H) 53(H)  ALT 0 - 44 U/L 11 32 37    Lab Results  Component Value Date   WBC 4.5 06/17/2018   HGB 8.5 (L) 06/17/2018   HCT 26.9 (L) 06/17/2018   MCV 100.4 (H) 06/17/2018   PLT 195 06/17/2018   NEUTROABS 2.2 06/17/2018    ASSESSMENT & PLAN:  Malignant neoplasm of left female breast ([place]) 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, grade 3, ER 0%, PR 0%, Ki-67 30%, HER-2 +3+ by IHC; hypoechoic mass 2 o'clock position 1.2 x 1.3 x 1.7 cm biopsy IDC, LV I present, grade 2-3, ER 0%, PR 0%, Ki-67 40%, HER-2 +3+, left axillary lymph node positive (3 nodes noted by [place]) T3 N1 stage IIIA  Treatment plan 1.Neoadjuvant chemotherapy with [place] followed by Herceptin Perjeta maintenance versus Kadcyla maintenance 2.possible mastectomy with targeted node dissection 3.Adjuvant radiation therapy --------------------------------------------------------------------------------------------------------------------------------------------------- Current treatment: Cycle 6 day 1 TCH Perjeta  Echo 12/10 shows LVEF of 60-65%--next echo scheduled next week  Chemo toxicities: 1.  Recurrent syncope requiring hospitalization: Unclear etiology, no further issues. 2.  Diarrhea: Imodium, Lomotil, Questran.  She is managing this.    3. Nail dyscrasia: No brittleness at this point, recommended she keep nails trim and moisturized. 4. Scalp Discomfort: resolved 5. Lower extremity swelling: bilateral lower extremity dopplers are negative, I instructed her to take  Lasix as needed. 6. Peripheral neuropathy: Grade 1, improved with gabapentin.    Breast MRI has been scheduled for today. Return to clinic after surgery to discuss pathology report.    No orders of the defined types were placed in this encounter.  The patient has a good understanding of the overall plan. she agrees with it. she will call with any problems that may develop before the next visit here.  Nicholas Lose, MD 06/17/2018  Julious Oka Dorshimer am acting as scribe for Dr. Nicholas Lose.  I have reviewed the above documentation for accuracy and completeness, and I agree with the above.

## 2018-06-17 ENCOUNTER — Inpatient Hospital Stay: Payer: Managed Care, Other (non HMO)

## 2018-06-17 ENCOUNTER — Inpatient Hospital Stay: Payer: Managed Care, Other (non HMO) | Admitting: Hematology and Oncology

## 2018-06-17 ENCOUNTER — Encounter: Payer: Self-pay | Admitting: *Deleted

## 2018-06-17 ENCOUNTER — Ambulatory Visit (HOSPITAL_COMMUNITY)
Admission: RE | Admit: 2018-06-17 | Discharge: 2018-06-17 | Disposition: A | Discharge status: Home / Self Care | Payer: Managed Care, Other (non HMO) | Source: Ambulatory Visit | Attending: Adult Health | Admitting: Adult Health

## 2018-06-17 ENCOUNTER — Other Ambulatory Visit: Payer: Self-pay

## 2018-06-17 VITALS — BP 118/53 | HR 67 | Temp 98.5°F | Resp 18

## 2018-06-17 DIAGNOSIS — C50412 Malignant neoplasm of upper-outer quadrant of left female breast: Secondary | ICD-10-CM

## 2018-06-17 DIAGNOSIS — Z171 Estrogen receptor negative status [ER-]: Secondary | ICD-10-CM | POA: Diagnosis not present

## 2018-06-17 DIAGNOSIS — K219 Gastro-esophageal reflux disease without esophagitis: Secondary | ICD-10-CM

## 2018-06-17 DIAGNOSIS — C50812 Malignant neoplasm of overlapping sites of left female breast: Secondary | ICD-10-CM | POA: Insufficient documentation

## 2018-06-17 DIAGNOSIS — J45909 Unspecified asthma, uncomplicated: Secondary | ICD-10-CM | POA: Diagnosis not present

## 2018-06-17 DIAGNOSIS — Z79899 Other long term (current) drug therapy: Secondary | ICD-10-CM

## 2018-06-17 DIAGNOSIS — G629 Polyneuropathy, unspecified: Secondary | ICD-10-CM

## 2018-06-17 LAB — CBC WITH DIFFERENTIAL/PLATELET
Abs Immature Granulocytes: 0.02 10*3/uL (ref 0.00–0.07)
BASOS ABS: 0 10*3/uL (ref 0.0–0.1)
Basophils Relative: 1 %
Eosinophils Absolute: 0 10*3/uL (ref 0.0–0.5)
Eosinophils Relative: 0 %
HCT: 26.9 % — ABNORMAL LOW (ref 36.0–46.0)
Hemoglobin: 8.5 g/dL — ABNORMAL LOW (ref 12.0–15.0)
Immature Granulocytes: 0 %
Lymphocytes Relative: 39 %
Lymphs Abs: 1.8 10*3/uL (ref 0.7–4.0)
MCH: 31.7 pg (ref 26.0–34.0)
MCHC: 31.6 g/dL (ref 30.0–36.0)
MCV: 100.4 fL — ABNORMAL HIGH (ref 80.0–100.0)
Monocytes Absolute: 0.5 10*3/uL (ref 0.1–1.0)
Monocytes Relative: 11 %
NEUTROS ABS: 2.2 10*3/uL (ref 1.7–7.7)
Neutrophils Relative %: 49 %
Platelets: 195 10*3/uL (ref 150–400)
RBC: 2.68 MIL/uL — ABNORMAL LOW (ref 3.87–5.11)
RDW: 19.8 % — ABNORMAL HIGH (ref 11.5–15.5)
WBC: 4.5 10*3/uL (ref 4.0–10.5)
nRBC: 0 % (ref 0.0–0.2)

## 2018-06-17 LAB — COMPREHENSIVE METABOLIC PANEL
ALBUMIN: 3.4 g/dL — AB (ref 3.5–5.0)
ALT: 11 U/L (ref 0–44)
ANION GAP: 7 (ref 5–15)
AST: 19 U/L (ref 15–41)
Alkaline Phosphatase: 71 U/L (ref 38–126)
BUN: 12 mg/dL (ref 6–20)
CO2: 27 mmol/L (ref 22–32)
Calcium: 7.9 mg/dL — ABNORMAL LOW (ref 8.9–10.3)
Chloride: 105 mmol/L (ref 98–111)
Creatinine, Ser: 0.82 mg/dL (ref 0.44–1.00)
GFR calc Af Amer: >60 mL/min (ref >60)
GFR calc non Af Amer: >60 mL/min (ref >60)
Glucose, Bld: 92 mg/dL (ref 70–99)
POTASSIUM: 3.9 mmol/L (ref 3.5–5.1)
Sodium: 139 mmol/L (ref 135–145)
Total Bilirubin: 0.6 mg/dL (ref 0.3–1.2)
Total Protein: 6.2 g/dL — ABNORMAL LOW (ref 6.5–8.1)

## 2018-06-17 MED ORDER — SODIUM CHLORIDE 0.9 % IV SOLN
60.0000 mg/m2 | Freq: Once | INTRAVENOUS | Status: AC
  Administered 2018-06-17: 120 mg via INTRAVENOUS
  Filled 2018-06-17: qty 12

## 2018-06-17 MED ORDER — SODIUM CHLORIDE 0.9 % IV SOLN
630.0000 mg | Freq: Once | INTRAVENOUS | Status: AC
  Administered 2018-06-17: 630 mg via INTRAVENOUS
  Filled 2018-06-17: qty 63

## 2018-06-17 MED ORDER — TRASTUZUMAB CHEMO 150 MG IV SOLR
6.0000 mg/kg | Freq: Once | INTRAVENOUS | Status: AC
  Administered 2018-06-17: 525 mg via INTRAVENOUS
  Filled 2018-06-17: qty 25

## 2018-06-17 MED ORDER — SODIUM CHLORIDE 0.9% FLUSH
10.0000 mL | INTRAVENOUS | Status: DC | PRN
  Administered 2018-06-17: 10 mL
  Filled 2018-06-17: qty 10

## 2018-06-17 MED ORDER — ACETAMINOPHEN 325 MG PO TABS
ORAL_TABLET | ORAL | Status: AC
  Filled 2018-06-17: qty 2

## 2018-06-17 MED ORDER — SODIUM CHLORIDE 0.9 % IV SOLN
Freq: Once | INTRAVENOUS | Status: AC
  Administered 2018-06-17: 11:00:00 via INTRAVENOUS
  Filled 2018-06-17: qty 5

## 2018-06-17 MED ORDER — HEPARIN SOD (PORK) LOCK FLUSH 100 UNIT/ML IV SOLN
500.0000 [IU] | Freq: Once | INTRAVENOUS | Status: AC | PRN
  Administered 2018-06-17: 500 [IU]
  Filled 2018-06-17: qty 5

## 2018-06-17 MED ORDER — SODIUM CHLORIDE 0.9 % IV SOLN
Freq: Once | INTRAVENOUS | Status: AC
  Administered 2018-06-17: 11:00:00 via INTRAVENOUS
  Filled 2018-06-17: qty 250

## 2018-06-17 MED ORDER — DIPHENHYDRAMINE HCL 25 MG PO CAPS
50.0000 mg | ORAL_CAPSULE | Freq: Once | ORAL | Status: AC
  Administered 2018-06-17: 50 mg via ORAL

## 2018-06-17 MED ORDER — PALONOSETRON HCL INJECTION 0.25 MG/5ML
INTRAVENOUS | Status: AC
  Filled 2018-06-17: qty 5

## 2018-06-17 MED ORDER — GADOBUTROL 1 MMOL/ML IV SOLN
10.0000 mL | Freq: Once | INTRAVENOUS | Status: AC | PRN
  Administered 2018-06-17: 8 mL via INTRAVENOUS

## 2018-06-17 MED ORDER — PALONOSETRON HCL INJECTION 0.25 MG/5ML
0.2500 mg | Freq: Once | INTRAVENOUS | Status: AC
  Administered 2018-06-17: 0.25 mg via INTRAVENOUS

## 2018-06-17 MED ORDER — ACETAMINOPHEN 325 MG PO TABS
650.0000 mg | ORAL_TABLET | Freq: Once | ORAL | Status: AC
  Administered 2018-06-17: 650 mg via ORAL

## 2018-06-17 MED ORDER — DIPHENHYDRAMINE HCL 25 MG PO CAPS
ORAL_CAPSULE | ORAL | Status: AC
  Filled 2018-06-17: qty 2

## 2018-06-17 MED ORDER — SODIUM CHLORIDE 0.9 % IV SOLN
420.0000 mg | Freq: Once | INTRAVENOUS | Status: AC
  Administered 2018-06-17: 420 mg via INTRAVENOUS
  Filled 2018-06-17: qty 14

## 2018-06-17 NOTE — Patient Instructions (Signed)
[facility] Discharge Instructions for Patients Receiving Chemotherapy  Today you received the following chemotherapy agents:  Herceptin, Perjeta, Taxotere, and Carboplatin.  To help prevent nausea and vomiting after your treatment, we encourage you to take your nausea medication as directed.   If you develop nausea and vomiting that is not controlled by your nausea medication, call the clinic.   BELOW ARE SYMPTOMS THAT SHOULD BE REPORTED IMMEDIATELY:  *FEVER GREATER THAN 100.5 F  *CHILLS WITH OR WITHOUT FEVER  NAUSEA AND VOMITING THAT IS NOT CONTROLLED WITH YOUR NAUSEA MEDICATION  *UNUSUAL SHORTNESS OF BREATH  *UNUSUAL BRUISING OR BLEEDING  TENDERNESS IN MOUTH AND THROAT WITH OR WITHOUT PRESENCE OF ULCERS  *URINARY PROBLEMS  *BOWEL PROBLEMS  UNUSUAL RASH Items with * indicate a potential emergency and should be followed up as soon as possible.  Feel free to call the clinic should you have any questions or concerns. The clinic phone number is [phone].  Please show the CHEMO ALERT CARD at check-in to the Emergency Department and triage nurse.   

## 2018-06-17 NOTE — Assessment & Plan Note (Signed)
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, grade 3, ER 0%, PR 0%, Ki-67 30%, HER-2 +3+ by IHC; hypoechoic mass 2 o'clock position 1.2 x 1.3 x 1.7 cm biopsy IDC, LV I present, grade 2-3, ER 0%, PR 0%, Ki-67 40%, HER-2 +3+, left axillary lymph node positive (3 nodes noted by [place]) T3 N1 stage IIIA  Treatment plan 1.Neoadjuvant chemotherapy with [place] followed by Herceptin Perjeta maintenance versus Kadcyla maintenance 2.possible mastectomy with targeted node dissection 3.Adjuvant radiation therapy --------------------------------------------------------------------------------------------------------------------------------------------------- Current treatment: Cycle 6 day 1 TCH Perjeta  Echo 12/10 shows LVEF of 60-65%--next echo scheduled next week  Chemo toxicities: 1.  Recurrent syncope requiring hospitalization: Unclear etiology, no further issues. 2.  Diarrhea: Imodium, Lomotil, Questran.  She is managing this.    3. Nail dyscrasia: No brittleness at this point, recommended she keep nails trim and moisturized. 4. Scalp Discomfort: resolved 5. Lower extremity swelling: bilateral lower extremity dopplers are negative, added lasix 94m daily as needed.   6. Peripheral neuropathy: patient met with Dr. GLindi Adietoday, who reviewed her neuropathy and dose reduced her chemotherapy.    Breast MRI has been scheduled for today. Return to clinic after surgery to discuss pathology report.

## 2018-06-19 ENCOUNTER — Other Ambulatory Visit: Payer: Self-pay

## 2018-06-19 ENCOUNTER — Inpatient Hospital Stay: Payer: Managed Care, Other (non HMO)

## 2018-06-19 VITALS — BP 115/58 | HR 74 | Temp 98.5°F | Resp 18

## 2018-06-19 DIAGNOSIS — C50412 Malignant neoplasm of upper-outer quadrant of left female breast: Secondary | ICD-10-CM | POA: Diagnosis not present

## 2018-06-19 MED ORDER — PEGFILGRASTIM-CBQV 6 MG/0.6ML ~~LOC~~ SOSY
6.0000 mg | PREFILLED_SYRINGE | Freq: Once | SUBCUTANEOUS | Status: AC
  Administered 2018-06-19: 6 mg via SUBCUTANEOUS

## 2018-06-19 MED ORDER — PEGFILGRASTIM-CBQV 6 MG/0.6ML ~~LOC~~ SOSY
PREFILLED_SYRINGE | SUBCUTANEOUS | Status: AC
  Filled 2018-06-19: qty 0.6

## 2018-06-19 NOTE — Patient Instructions (Signed)
Pegfilgrastim injection  What is this medicine?  PEGFILGRASTIM (PEG fil gra stim) is a long-acting granulocyte colony-stimulating factor that stimulates the growth of neutrophils, a type of white blood cell important in the body's fight against infection. It is used to reduce the incidence of fever and infection in patients with certain types of cancer who are receiving chemotherapy that affects the bone marrow, and to increase survival after being exposed to high doses of radiation.  This medicine may be used for other purposes; ask your health care provider or pharmacist if you have questions.  COMMON BRAND NAME(S): Fulphila, Neulasta, UDENYCA  What should I tell my health care provider before I take this medicine?  They need to know if you have any of these conditions:  -kidney disease  -latex allergy  -ongoing radiation therapy  -sickle cell disease  -skin reactions to acrylic adhesives (On-Body Injector only)  -an unusual or allergic reaction to pegfilgrastim, filgrastim, other medicines, foods, dyes, or preservatives  -pregnant or trying to get pregnant  -breast-feeding  How should I use this medicine?  This medicine is for injection under the skin. If you get this medicine at home, you will be taught how to prepare and give the pre-filled syringe or how to use the On-body Injector. Refer to the patient Instructions for Use for detailed instructions. Use exactly as directed. Tell your healthcare provider immediately if you suspect that the On-body Injector may not have performed as intended or if you suspect the use of the On-body Injector resulted in a missed or partial dose.  It is important that you put your used needles and syringes in a special sharps container. Do not put them in a trash can. If you do not have a sharps container, call your pharmacist or healthcare provider to get one.  Talk to your pediatrician regarding the use of this medicine in children. While this drug may be prescribed for  selected conditions, precautions do apply.  Overdosage: If you think you have taken too much of this medicine contact a poison control center or emergency room at once.  NOTE: This medicine is only for you. Do not share this medicine with others.  What if I miss a dose?  It is important not to miss your dose. Call your doctor or health care professional if you miss your dose. If you miss a dose due to an On-body Injector failure or leakage, a new dose should be administered as soon as possible using a single prefilled syringe for manual use.  What may interact with this medicine?  Interactions have not been studied.  Give your health care provider a list of all the medicines, herbs, non-prescription drugs, or dietary supplements you use. Also tell them if you smoke, drink alcohol, or use illegal drugs. Some items may interact with your medicine.  This list may not describe all possible interactions. Give your health care provider a list of all the medicines, herbs, non-prescription drugs, or dietary supplements you use. Also tell them if you smoke, drink alcohol, or use illegal drugs. Some items may interact with your medicine.  What should I watch for while using this medicine?  You may need blood work done while you are taking this medicine.  If you are going to need a MRI, CT scan, or other procedure, tell your doctor that you are using this medicine (On-Body Injector only).  What side effects may I notice from receiving this medicine?  Side effects that you should report to   your doctor or health care professional as soon as possible:  -allergic reactions like skin rash, itching or hives, swelling of the face, lips, or tongue  -back pain  -dizziness  -fever  -pain, redness, or irritation at site where injected  -pinpoint red spots on the skin  -red or dark-brown urine  -shortness of breath or breathing problems  -stomach or side pain, or pain at the shoulder  -swelling  -tiredness  -trouble passing urine or  change in the amount of urine  Side effects that usually do not require medical attention (report to your doctor or health care professional if they continue or are bothersome):  -bone pain  -muscle pain  This list may not describe all possible side effects. Call your doctor for medical advice about side effects. You may report side effects to FDA at 1-800-FDA-1088.  Where should I keep my medicine?  Keep out of the reach of children.  If you are using this medicine at home, you will be instructed on how to store it. Throw away any unused medicine after the expiration date on the label.  NOTE: This sheet is a summary. It may not cover all possible information. If you have questions about this medicine, talk to your doctor, pharmacist, or health care provider.   2019 Elsevier/Gold Standard (2017-06-15 16:57:08)

## 2018-06-21 ENCOUNTER — Telehealth: Payer: Self-pay | Admitting: Adult Health

## 2018-06-21 NOTE — Telephone Encounter (Signed)
Called patient and reviewed her breast MRI results with her in detail.  She voiced appreciation and happiness with results.  Also briefly discussed future treatment plan.  She will review with Dr. Lindi Adie on Wednesay, 06/23/2018.  Wilber Bihari, NP

## 2018-06-22 ENCOUNTER — Ambulatory Visit: Payer: Self-pay | Admitting: General Surgery

## 2018-06-22 DIAGNOSIS — Z171 Estrogen receptor negative status [ER-]: Principal | ICD-10-CM

## 2018-06-22 DIAGNOSIS — C50412 Malignant neoplasm of upper-outer quadrant of left female breast: Secondary | ICD-10-CM

## 2018-06-22 NOTE — Progress Notes (Signed)
HEMATOLOGY-ONCOLOGY [place] VISIT PROGRESS NOTE  I connected with Christy Serrano on 06/23/2018 at  2:15 PM EDT by Webex video conference and verified that I am speaking with the correct person using two identifiers.  I discussed the limitations, risks, security and privacy concerns of performing an evaluation and management service by Webex and the availability of in person appointments.  I also discussed with the patient that there may be a patient responsible charge related to this service. The patient expressed understanding and agreed to proceed.  Patient's Location: Home Physician Location: Clinic  CHIEF COMPLIANT: Follow-up to review breast MRI and discuss further treatment  INTERVAL HISTORY: Christy Serrano is a 63 y.o. female with above-mentioned history of left breast cancer who completed 6 cycles of neoadjuvant chemotherapy with TCHP. A breast MRI from 06/17/18 showed complete pathologic response in the left breast and decrease in the size of two left axillary lymph nodes from 52m to 831mand 2m72mo 5mm28mhe presents today to review the results of the MRI.    Malignant neoplasm of left female breast ([facility])[place]10/28/2019 Initial Diagnosis    Screening mammogram detected microcalcifications and asymmetry, left breast segmental microcalcifications UOQ 1.5 x 4.3 x 5.4 cm biopsy-proven IDC with DCIS with LV I, grade 3, ER 0%, PR 0%, Ki-67 30%, HER-2 +3+ by IHC; hypoechoic mass 2 o'clock position 1.2 x 1.3 x 1.7 cm biopsy IDC, LV I present, grade 2-3, ER 0%, PR 0%, Ki-67 40%, HER-2 +3+, left axillary lymph node positive (3 nodes noted by [place]) [place] N1 stage IIIA    02/03/2018 Cancer Staging    Staging form: Breast, AJCC 8th Edition - Clinical: Stage IIIA (cT3, cN1, cM0, G3, ER-, PR-, HER2+) - Signed by GudeNicholas Lose on 02/03/2018    02/20/2018 Genetic Testing    No pathogenic variants identified on the [employer] Expanded + RNA insight panel. Genes Analyzed (67 total): AIP, ALK, APC*,  ATM*, BAP1, BARD1, BLM, BMPR1A, BRCA1*, BRCA2*, BRIP1*, CDH1*, CDK4, CDKN1B, CDKN2A, CHEK2*, DICER1, FANCC, FH, FLCN, GALNT12, HOXB13, MAX, MEN1, MET, MLH1*, MRE11A, MSH2*, MSH6*, MUTYH*, NBN, NF1*, NF2, PALB2*, PHOX2B, PMS2*, POLD1, POLE, POT1, PRKAR1A, PTCH1, PTEN*, RAD50, RAD51C*, RAD51D*, RB1, RET, SDHA, SDHAF2, SDHB, SDHC, SDHD, SMAD4, SMARCA4, SMARCB1, SMARCE1, STK11, SUFU, TMEM127, TP53*, TSC1, TSC2, VHL and XRCC2 (sequencing and deletion/duplication); MITF (sequencing only); EPCAM and GREM1 (deletion/duplication only). DNA and RNA analyses performed for * genes. The report date is 02/20/2018.    03/04/2018 -  Neo-Adjuvant Chemotherapy    [facility]    03/08/2018 - 03/10/2018 Hospital Admission    Admitted for recurrent syncope with loss of consciousness for up to 5 minutes with loss of bladder, no abnormalities identified.  MRI brain, carotid ultrasound, EEG studies were negative.    06/17/2018 Breast MRI    No suspicious residual enhancement identified in the left breast, compatible with treatment response, interval decrease in size of left axillary lymph nodes     Observations/Objective:  There were no vitals filed for this visit. There is no height or weight on file to calculate BMI.  I have reviewed the data as listed CMP Latest Ref Rng & Units 06/17/2018 05/27/2018 05/07/2018  Glucose 70 - 99 mg/dL 92 117(H) 115(H)  BUN 6 - 20 mg/dL _0 Creatinine 0.44 - 1.00 mg/dL 0.82 0.83 0.88  Sodium 135 - 145 mmol/L 139 137 138  Potassium 3.5 - 5.1 mmol/L 3.9 4.3 4.5  Chloride 98 - 111  mmol/L 105 107 106  CO2 22 - 32 mmol/L _0 Calcium 8.9 - 10.3 mg/dL 7.9(L) 9.0 9.2  Total Protein 6.5 - 8.1 g/dL 6.2(L) 6.6 6.8  Total Bilirubin 0.3 - 1.2 mg/dL 0.6 0.3 0.6  Alkaline Phos 38 - 126 U/L 71 70 94  AST 15 - 41 U/L 19 49(H) 53(H)  ALT 0 - 44 U/L 11 32 37    Lab Results  Component Value Date   WBC 4.5 06/17/2018   HGB 8.5 (L) 06/17/2018   HCT 26.9  (L) 06/17/2018   MCV 100.4 (H) 06/17/2018   PLT 195 06/17/2018   NEUTROABS 2.2 06/17/2018      Assessment Plan:  Malignant neoplasm of left female breast ([place]) 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, grade 3, ER 0%, PR 0%, Ki-67 30%, HER-2 +3+ by IHC; hypoechoic mass 2 o'clock position 1.2 x 1.3 x 1.7 cm biopsy IDC, LV I present, grade 2-3, ER 0%, PR 0%, Ki-67 40%, HER-2 +3+, left axillary lymph node positive (3 nodes noted by [place]) T3 N1 stage IIIA  Treatment plan 1.Neoadjuvant chemotherapy with TCH Perjeta completed 06/17/2018 followed by Herceptin Perjeta maintenance versus Kadcyla maintenance  2. 2 lumpectomies with axillary lymph node dissection 3.Adjuvant radiation therapy --------------------------------------------------------------------------------------------------------------------------------------------------- Breast MRI 06/18/2018:No suspicious residual enhancement identified in the left breast, compatible with treatment response, interval decrease in size of left axillary lymph nodes  I reviewed the radiology images with the patient and discussed excellent response she received from treatment.  Surgery has been scheduled for 07/12/2018  Return to clinic after surgery to discuss pathology report.  I discussed the assessment and treatment plan with the patient. The patient was provided an opportunity to ask questions and all were answered. The patient agreed with the plan and demonstrated an understanding of the instructions. The patient was advised to call back or seek an in-person evaluation if the symptoms worsen or if the condition fails to improve as anticipated.   I provided 15 minutes of face-to-face Web Ex time during this encounter.    Rulon Eisenmenger, MD 06/23/2018    I, Molly Dorshimer, am acting as scribe for Nicholas Lose,  MD.  I have reviewed the above documentation for accuracy and completeness, and I agree with the above.

## 2018-06-23 ENCOUNTER — Other Ambulatory Visit: Payer: Self-pay | Admitting: General Surgery

## 2018-06-23 ENCOUNTER — Inpatient Hospital Stay: Payer: Managed Care, Other (non HMO) | Attending: Hematology and Oncology | Admitting: Hematology and Oncology

## 2018-06-23 ENCOUNTER — Encounter: Payer: Self-pay | Admitting: Hematology and Oncology

## 2018-06-23 ENCOUNTER — Other Ambulatory Visit: Payer: Managed Care, Other (non HMO)

## 2018-06-23 DIAGNOSIS — Z171 Estrogen receptor negative status [ER-]: Secondary | ICD-10-CM

## 2018-06-23 DIAGNOSIS — C50412 Malignant neoplasm of upper-outer quadrant of left female breast: Secondary | ICD-10-CM | POA: Diagnosis not present

## 2018-06-23 NOTE — Assessment & Plan Note (Signed)
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, grade 3, ER 0%, PR 0%, Ki-67 30%, HER-2 +3+ by IHC; hypoechoic mass 2 o'clock position 1.2 x 1.3 x 1.7 cm biopsy IDC, LV I present, grade 2-3, ER 0%, PR 0%, Ki-67 40%, HER-2 +3+, left axillary lymph node positive (3 nodes noted by [place]) T3 N1 stage IIIA  Treatment plan 1.Neoadjuvant chemotherapy with TCH Perjeta completed 06/17/2018 followed by Herceptin Perjeta maintenance versus Kadcyla maintenance  2.possible mastectomy with targeted node dissection 3.Adjuvant radiation therapy --------------------------------------------------------------------------------------------------------------------------------------------------- Breast MRI 06/18/2018:No suspicious residual enhancement identified in the left breast, compatible with treatment response, interval decrease in size of left axillary lymph nodes  I reviewed the radiology images with the patient and discussed excellent response she received from treatment. Could be offered breast conserving surgery with targeted node dissection. Return to clinic after surgery to discuss pathology report.

## 2018-06-23 NOTE — Progress Notes (Signed)
Also discussed with patient again [employer] and qualifications to assist with personal expenses while going through treatment. Patient currently household of 1 along with sister. Patient is currently over the income for the grant but thinks that her income will reduce at the end of the month. Advised patient to contact me if income reduces to revisit the grant. Gave her my contact name and number. She verbalized understanding.

## 2018-06-23 NOTE — Progress Notes (Signed)
Received staff message from RN regarding Herceptin and Perjeta copay. Advised I would enroll.  Attempted to enroll and received an error message. Checked status and patient was already enrolled and must have enrolled herself. Called patient and she states she did on 06/15/18. Patient was approved for $25,000 for Herceptin leaving her with a $0 copay after insurance pays and $25,000 for Perjeta leaving her with a $0 copay after insurance pays. Her retro period was 02/15/18. Called [employer]([place]) to have card information pushed to our portal. This was successful. Provided Lenise with a copy for billing/copay submissions.  Enrolled patient in copay assistance for Udenyca through [employer]. Patient approved for up to $15,000 maximum benefit over the next 12 months with the earliest acceptable claim date of 12/25/17. Copy given to [facility] for billing/copay submissions.  Patient will receive a copy of the approval letter in the mail for her records only. She verbalized understanding and has my name and contact number for any additional financial questions or concerns.

## 2018-06-24 ENCOUNTER — Telehealth: Payer: Self-pay | Admitting: Hematology and Oncology

## 2018-06-24 NOTE — Telephone Encounter (Signed)
No 4/1 los °

## 2018-06-25 ENCOUNTER — Telehealth: Payer: Self-pay | Admitting: Hematology and Oncology

## 2018-06-25 NOTE — Telephone Encounter (Signed)
Scheduled appt per 4/1 sch message - pt aware of appt date and time   

## 2018-06-25 NOTE — Telephone Encounter (Signed)
Scheduled per 3/30 sch msg. Called and spoke with patient. Confirmed dates and times.

## 2018-06-28 ENCOUNTER — Encounter: Payer: Self-pay | Admitting: Hematology and Oncology

## 2018-06-29 ENCOUNTER — Telehealth: Payer: Self-pay | Admitting: Emergency Medicine

## 2018-06-29 MED FILL — BENZONATATE 200 MG CAPS: 200 | 7 days supply | Qty: 21 | Fill #1

## 2018-06-29 MED FILL — FUROSEMIDE 20 MG TAB: 20 | 30 days supply | Qty: 30 | Fill #1

## 2018-06-29 NOTE — Telephone Encounter (Signed)
Pt called [facility] asking to speak to PA [employer].  Reporting continued dry cough without relief from previously prescribed medications, and occasional 'fluttering breathing'.  Denies F/C or CP/SOB.  Also requesting that she have any medications sent to '[facility]' d/t issues w/insurance.  Spoke with PA Lucianne Lei, states he will call pt back this afternoon.  Pt VU.

## 2018-06-30 ENCOUNTER — Encounter: Payer: Managed Care, Other (non HMO) | Admitting: Medical

## 2018-07-04 ENCOUNTER — Encounter: Payer: Self-pay | Admitting: Hematology and Oncology

## 2018-07-06 ENCOUNTER — Encounter (HOSPITAL_BASED_OUTPATIENT_CLINIC_OR_DEPARTMENT_OTHER): Payer: Self-pay | Admitting: *Deleted

## 2018-07-06 ENCOUNTER — Other Ambulatory Visit: Payer: Self-pay

## 2018-07-07 ENCOUNTER — Telehealth: Payer: Self-pay | Admitting: Hematology and Oncology

## 2018-07-07 NOTE — Telephone Encounter (Signed)
Adjusted appts per 4/07 sch message - called pt to confirm apt - left message.

## 2018-07-08 ENCOUNTER — Other Ambulatory Visit: Payer: Managed Care, Other (non HMO)

## 2018-07-08 ENCOUNTER — Ambulatory Visit: Payer: Managed Care, Other (non HMO)

## 2018-07-09 ENCOUNTER — Other Ambulatory Visit: Payer: Self-pay

## 2018-07-09 ENCOUNTER — Ambulatory Visit
Admission: RE | Admit: 2018-07-09 | Discharge: 2018-07-09 | Disposition: A | Discharge status: Home / Self Care | Payer: Managed Care, Other (non HMO) | Source: Ambulatory Visit | Attending: General Surgery | Admitting: General Surgery

## 2018-07-09 ENCOUNTER — Other Ambulatory Visit: Payer: Self-pay | Admitting: General Surgery

## 2018-07-09 DIAGNOSIS — Z171 Estrogen receptor negative status [ER-]: Principal | ICD-10-CM

## 2018-07-09 DIAGNOSIS — C50412 Malignant neoplasm of upper-outer quadrant of left female breast: Secondary | ICD-10-CM

## 2018-07-09 MED FILL — GABAPENTIN 300 MG CAPSULE: 300 | 30 days supply | Qty: 30 | Fill #1

## 2018-07-09 NOTE — Progress Notes (Signed)
Ensure pre surgical drink given to pt with instructions to drink by 0515 DOS. Pt given hibiclens soap to use night before and morning of surgery. Pt verbalized understanding.

## 2018-07-11 ENCOUNTER — Encounter: Payer: Self-pay | Admitting: Medical

## 2018-07-12 ENCOUNTER — Ambulatory Visit (HOSPITAL_BASED_OUTPATIENT_CLINIC_OR_DEPARTMENT_OTHER): Payer: Managed Care, Other (non HMO) | Admitting: Anesthesiology

## 2018-07-12 ENCOUNTER — Ambulatory Visit (HOSPITAL_BASED_OUTPATIENT_CLINIC_OR_DEPARTMENT_OTHER)
Admission: RE | Admit: 2018-07-12 | Discharge: 2018-07-12 | Disposition: A | Discharge status: Home / Self Care | Payer: Managed Care, Other (non HMO) | Attending: General Surgery | Admitting: General Surgery

## 2018-07-12 ENCOUNTER — Ambulatory Visit
Admission: RE | Admit: 2018-07-12 | Discharge: 2018-07-12 | Disposition: A | Discharge status: Home / Self Care | Payer: Managed Care, Other (non HMO) | Source: Ambulatory Visit | Attending: General Surgery | Admitting: General Surgery

## 2018-07-12 ENCOUNTER — Encounter (HOSPITAL_BASED_OUTPATIENT_CLINIC_OR_DEPARTMENT_OTHER): Payer: Self-pay

## 2018-07-12 ENCOUNTER — Encounter (HOSPITAL_BASED_OUTPATIENT_CLINIC_OR_DEPARTMENT_OTHER)
Admission: RE | Disposition: A | Discharge status: Home / Self Care | Payer: Self-pay | Source: Home / Self Care | Attending: General Surgery

## 2018-07-12 ENCOUNTER — Other Ambulatory Visit: Payer: Self-pay

## 2018-07-12 DIAGNOSIS — C50412 Malignant neoplasm of upper-outer quadrant of left female breast: Secondary | ICD-10-CM

## 2018-07-12 DIAGNOSIS — Z171 Estrogen receptor negative status [ER-]: Principal | ICD-10-CM

## 2018-07-12 DIAGNOSIS — Z79899 Other long term (current) drug therapy: Secondary | ICD-10-CM | POA: Insufficient documentation

## 2018-07-12 DIAGNOSIS — Z880 Allergy status to penicillin: Secondary | ICD-10-CM | POA: Insufficient documentation

## 2018-07-12 DIAGNOSIS — K219 Gastro-esophageal reflux disease without esophagitis: Secondary | ICD-10-CM | POA: Insufficient documentation

## 2018-07-12 DIAGNOSIS — Z7982 Long term (current) use of aspirin: Secondary | ICD-10-CM | POA: Insufficient documentation

## 2018-07-12 DIAGNOSIS — Z886 Allergy status to analgesic agent status: Secondary | ICD-10-CM | POA: Diagnosis not present

## 2018-07-12 DIAGNOSIS — Z885 Allergy status to narcotic agent status: Secondary | ICD-10-CM | POA: Diagnosis not present

## 2018-07-12 DIAGNOSIS — C50912 Malignant neoplasm of unspecified site of left female breast: Secondary | ICD-10-CM

## 2018-07-12 HISTORY — PX: BREAST LUMPECTOMY WITH RADIOACTIVE SEED AND AXILLARY LYMPH NODE DISSECTION: SHX6656

## 2018-07-12 HISTORY — DX: Unspecified chronic bronchitis: J42

## 2018-07-12 HISTORY — PX: BREAST LUMPECTOMY: SHX2

## 2018-07-12 HISTORY — DX: Gastro-esophageal reflux disease without esophagitis: K21.9

## 2018-07-12 SURGERY — BREAST LUMPECTOMY WITH RADIOACTIVE SEED AND AXILLARY LYMPH NODE DISSECTION
Anesthesia: General | Site: Breast | Laterality: Left

## 2018-07-12 MED ORDER — PROMETHAZINE HCL 25 MG/ML IJ SOLN: 6.2500 mg | INTRAMUSCULAR | Status: DC | PRN

## 2018-07-12 MED ORDER — SCOPOLAMINE 1 MG/3DAYS TD PT72: 1.0000 | MEDICATED_PATCH | Freq: Once | TRANSDERMAL | Status: DC | PRN

## 2018-07-12 MED ORDER — MIDAZOLAM HCL 2 MG/2ML IJ SOLN
INTRAMUSCULAR | Status: AC
  Filled 2018-07-12: qty 2

## 2018-07-12 MED ORDER — GABAPENTIN 300 MG PO CAPS
ORAL_CAPSULE | ORAL | Status: AC
  Filled 2018-07-12: qty 1

## 2018-07-12 MED ORDER — FENTANYL CITRATE (PF) 100 MCG/2ML IJ SOLN
INTRAMUSCULAR | Status: AC
  Filled 2018-07-12: qty 2

## 2018-07-12 MED ORDER — HYDROCODONE-ACETAMINOPHEN 5-325 MG PO TABS
1.0000 | ORAL_TABLET | Freq: Once | ORAL | Status: AC
  Administered 2018-07-12: 13:00:00 1 via ORAL

## 2018-07-12 MED ORDER — LORAZEPAM 0.5 MG PO TABS: 0.5000 mg | ORAL_TABLET | Freq: Every evening | ORAL | Status: DC | PRN

## 2018-07-12 MED ORDER — ONDANSETRON HCL 4 MG/2ML IJ SOLN
INTRAMUSCULAR | Status: AC
  Filled 2018-07-12: qty 2

## 2018-07-12 MED ORDER — METHOCARBAMOL 500 MG PO TABS: 500.0000 mg | ORAL_TABLET | Freq: Four times a day (QID) | ORAL | Status: DC | PRN

## 2018-07-12 MED ORDER — GABAPENTIN 300 MG PO CAPS: 300.0000 mg | ORAL_CAPSULE | Freq: Every day | ORAL | Status: DC

## 2018-07-12 MED ORDER — HYDROCODONE-ACETAMINOPHEN 5-325 MG PO TABS: 1.0000 | ORAL_TABLET | Freq: Four times a day (QID) | ORAL | 0 refills | Status: DC | PRN

## 2018-07-12 MED ORDER — LACTATED RINGERS IV SOLN
INTRAVENOUS | Status: DC
  Administered 2018-07-12: 08:00:00 10 mL/h via INTRAVENOUS

## 2018-07-12 MED ORDER — ALBUTEROL SULFATE HFA 108 (90 BASE) MCG/ACT IN AERS: 2.0000 | INHALATION_SPRAY | Freq: Four times a day (QID) | RESPIRATORY_TRACT | Status: DC | PRN

## 2018-07-12 MED ORDER — HYDROCODONE-ACETAMINOPHEN 5-325 MG PO TABS: 1.0000 | ORAL_TABLET | ORAL | Status: DC | PRN

## 2018-07-12 MED ORDER — KCL IN DEXTROSE-NACL 20-5-0.9 MEQ/L-%-% IV SOLN: INTRAVENOUS | Status: DC

## 2018-07-12 MED ORDER — CHLORHEXIDINE GLUCONATE CLOTH 2 % EX PADS: 6.0000 | MEDICATED_PAD | Freq: Once | CUTANEOUS | Status: DC

## 2018-07-12 MED ORDER — FENTANYL CITRATE (PF) 100 MCG/2ML IJ SOLN: 50.0000 ug | INTRAMUSCULAR | Status: DC | PRN

## 2018-07-12 MED ORDER — ONDANSETRON 4 MG PO TBDP: 4.0000 mg | ORAL_TABLET | Freq: Four times a day (QID) | ORAL | Status: DC | PRN

## 2018-07-12 MED ORDER — KETOROLAC TROMETHAMINE 30 MG/ML IJ SOLN
30.0000 mg | Freq: Once | INTRAMUSCULAR | Status: AC | PRN
  Administered 2018-07-12: 13:00:00 30 mg via INTRAVENOUS

## 2018-07-12 MED ORDER — KETOROLAC TROMETHAMINE 30 MG/ML IJ SOLN
INTRAMUSCULAR | Status: AC
  Filled 2018-07-12: qty 1

## 2018-07-12 MED ORDER — MOMETASONE FURO-FORMOTEROL FUM 200-5 MCG/ACT IN AERO: 2.0000 | INHALATION_SPRAY | Freq: Two times a day (BID) | RESPIRATORY_TRACT | Status: DC

## 2018-07-12 MED ORDER — BUPIVACAINE HCL (PF) 0.25 % IJ SOLN
INTRAMUSCULAR | Status: DC | PRN
  Administered 2018-07-12: 10 mL

## 2018-07-12 MED ORDER — CELECOXIB 200 MG PO CAPS
200.0000 mg | ORAL_CAPSULE | ORAL | Status: AC
  Administered 2018-07-12: 08:00:00 200 mg via ORAL

## 2018-07-12 MED ORDER — FENTANYL CITRATE (PF) 100 MCG/2ML IJ SOLN
25.0000 ug | INTRAMUSCULAR | Status: DC | PRN
  Administered 2018-07-12: 12:00:00 50 ug via INTRAVENOUS
  Administered 2018-07-12: 25 ug via INTRAVENOUS

## 2018-07-12 MED ORDER — PROPOFOL 10 MG/ML IV BOLUS
INTRAVENOUS | Status: DC | PRN
  Administered 2018-07-12: 50 mg via INTRAVENOUS
  Administered 2018-07-12: 150 mg via INTRAVENOUS

## 2018-07-12 MED ORDER — TRAMADOL HCL 50 MG PO TABS: 50.0000 mg | ORAL_TABLET | Freq: Four times a day (QID) | ORAL | Status: DC | PRN

## 2018-07-12 MED ORDER — VANCOMYCIN HCL IN DEXTROSE 1-5 GM/200ML-% IV SOLN
INTRAVENOUS | Status: AC
  Filled 2018-07-12: qty 200

## 2018-07-12 MED ORDER — SCOPOLAMINE 1 MG/3DAYS TD PT72
MEDICATED_PATCH | TRANSDERMAL | Status: DC | PRN
  Administered 2018-07-12: 1 via TRANSDERMAL

## 2018-07-12 MED ORDER — LIDOCAINE 2% (20 MG/ML) 5 ML SYRINGE
INTRAMUSCULAR | Status: DC | PRN
  Administered 2018-07-12: 100 mg via INTRAVENOUS

## 2018-07-12 MED ORDER — PROPOFOL 10 MG/ML IV BOLUS
INTRAVENOUS | Status: AC
  Filled 2018-07-12: qty 40

## 2018-07-12 MED ORDER — CELECOXIB 200 MG PO CAPS
ORAL_CAPSULE | ORAL | Status: AC
  Filled 2018-07-12: qty 1

## 2018-07-12 MED ORDER — MIDAZOLAM HCL 2 MG/2ML IJ SOLN
1.0000 mg | INTRAMUSCULAR | Status: DC | PRN
  Administered 2018-07-12: 2 mg via INTRAVENOUS

## 2018-07-12 MED ORDER — SCOPOLAMINE 1 MG/3DAYS TD PT72
MEDICATED_PATCH | TRANSDERMAL | Status: AC
  Filled 2018-07-12: qty 1

## 2018-07-12 MED ORDER — HYDROCODONE-ACETAMINOPHEN 5-325 MG PO TABS
ORAL_TABLET | ORAL | Status: AC
  Filled 2018-07-12: qty 1

## 2018-07-12 MED ORDER — ONDANSETRON HCL 4 MG/2ML IJ SOLN: 4.0000 mg | Freq: Four times a day (QID) | INTRAMUSCULAR | Status: DC | PRN

## 2018-07-12 MED ORDER — VANCOMYCIN HCL 1000 MG IV SOLR
1000.0000 mg | INTRAVENOUS | Status: AC
  Administered 2018-07-12: 09:00:00 1000 mg via INTRAVENOUS

## 2018-07-12 MED ORDER — FENTANYL CITRATE (PF) 100 MCG/2ML IJ SOLN
INTRAMUSCULAR | Status: DC | PRN
  Administered 2018-07-12 (×3): 25 ug via INTRAVENOUS
  Administered 2018-07-12: 50 ug via INTRAVENOUS
  Administered 2018-07-12 (×3): 25 ug via INTRAVENOUS

## 2018-07-12 MED ORDER — ONDANSETRON HCL 4 MG/2ML IJ SOLN
INTRAMUSCULAR | Status: DC | PRN
  Administered 2018-07-12: 4 mg via INTRAVENOUS

## 2018-07-12 MED ORDER — TRAMADOL HCL 50 MG PO TABS: 50.0000 mg | ORAL_TABLET | Freq: Four times a day (QID) | ORAL | 1 refills | Status: DC | PRN

## 2018-07-12 MED ORDER — DEXAMETHASONE SODIUM PHOSPHATE 4 MG/ML IJ SOLN
INTRAMUSCULAR | Status: DC | PRN
  Administered 2018-07-12: 10 mg via INTRAVENOUS

## 2018-07-12 MED ORDER — HEPARIN SODIUM (PORCINE) 5000 UNIT/ML IJ SOLN: 5000.0000 [IU] | Freq: Three times a day (TID) | INTRAMUSCULAR | Status: DC

## 2018-07-12 MED ORDER — PANTOPRAZOLE SODIUM 40 MG PO TBEC: 40.0000 mg | DELAYED_RELEASE_TABLET | Freq: Every day | ORAL | Status: DC

## 2018-07-12 MED ORDER — FENTANYL CITRATE (PF) 100 MCG/2ML IJ SOLN: 25.0000 ug | INTRAMUSCULAR | Status: DC | PRN

## 2018-07-12 MED ORDER — ONDANSETRON HCL 8 MG PO TABS: 8.0000 mg | ORAL_TABLET | Freq: Two times a day (BID) | ORAL | Status: DC | PRN

## 2018-07-12 MED ORDER — BUPIVACAINE HCL (PF) 0.25 % IJ SOLN
INTRAMUSCULAR | Status: AC
  Filled 2018-07-12: qty 30

## 2018-07-12 MED ORDER — METHYLENE BLUE 0.5 % INJ SOLN
INTRAVENOUS | Status: AC
  Filled 2018-07-12: qty 10

## 2018-07-12 MED ORDER — DEXAMETHASONE SODIUM PHOSPHATE 10 MG/ML IJ SOLN
INTRAMUSCULAR | Status: AC
  Filled 2018-07-12: qty 1

## 2018-07-12 MED ORDER — LIDOCAINE 2% (20 MG/ML) 5 ML SYRINGE
INTRAMUSCULAR | Status: AC
  Filled 2018-07-12: qty 5

## 2018-07-12 MED ORDER — PANTOPRAZOLE SODIUM 40 MG IV SOLR: 40.0000 mg | Freq: Every day | INTRAVENOUS | Status: DC

## 2018-07-12 MED ORDER — GABAPENTIN 300 MG PO CAPS: 300.0000 mg | ORAL_CAPSULE | ORAL | Status: DC

## 2018-07-12 MED FILL — traMADol HCL 50 MG TABS: 50 | 3 days supply | Qty: 20 | Fill #0

## 2018-07-12 MED FILL — HYDROCODON-APAP 5-325: 5-325 | 2 days supply | Qty: 15 | Fill #0

## 2018-07-12 SURGICAL SUPPLY — 57 items
ADH SKN CLS APL DERMABOND .7 (GAUZE/BANDAGES/DRESSINGS) ×1
APL PRP STRL LF DISP 70% ISPRP (MISCELLANEOUS) ×1
APPLIER CLIP 11 MED OPEN (CLIP) ×2
APPLIER CLIP 9.375 MED OPEN (MISCELLANEOUS) ×2
APR CLP MED 11 20 MLT OPN (CLIP) ×1
APR CLP MED 9.3 20 MLT OPN (MISCELLANEOUS) ×1
BINDER BREAST XLRG (GAUZE/BANDAGES/DRESSINGS) ×1 IMPLANT
BIOPATCH RED 1 DISK 7.0 (GAUZE/BANDAGES/DRESSINGS) ×1 IMPLANT
BLADE SURG 15 STRL LF DISP TIS (BLADE) ×1 IMPLANT
BLADE SURG 15 STRL SS (BLADE) ×2
CANISTER SUC SOCK COL 7IN (MISCELLANEOUS) IMPLANT
CANISTER SUCT 1200ML W/VALVE (MISCELLANEOUS) ×2 IMPLANT
CHLORAPREP W/TINT 26 (MISCELLANEOUS) ×2 IMPLANT
CLIP APPLIE 11 MED OPEN (CLIP) ×1 IMPLANT
CLIP APPLIE 9.375 MED OPEN (MISCELLANEOUS) ×1 IMPLANT
COVER BACK TABLE REUSABLE LG (DRAPES) ×2 IMPLANT
COVER MAYO STAND REUSABLE (DRAPES) ×2 IMPLANT
COVER PROBE W GEL 5X96 (DRAPES) ×2 IMPLANT
COVER WAND RF STERILE (DRAPES) IMPLANT
DECANTER SPIKE VIAL GLASS SM (MISCELLANEOUS) IMPLANT
DERMABOND ADVANCED (GAUZE/BANDAGES/DRESSINGS) ×1
DERMABOND ADVANCED .7 DNX12 (GAUZE/BANDAGES/DRESSINGS) ×1 IMPLANT
DRAIN CHANNEL 19F RND (DRAIN) ×1 IMPLANT
DRAPE LAPAROSCOPIC ABDOMINAL (DRAPES) ×2 IMPLANT
DRAPE UTILITY XL STRL (DRAPES) ×2 IMPLANT
DRSG PAD ABDOMINAL 8X10 ST (GAUZE/BANDAGES/DRESSINGS) ×2 IMPLANT
DRSG TEGADERM 2-3/8X2-3/4 SM (GAUZE/BANDAGES/DRESSINGS) ×1 IMPLANT
ELECT COATED BLADE 2.86 ST (ELECTRODE) ×2 IMPLANT
ELECT REM PT RETURN 9FT ADLT (ELECTROSURGICAL) ×2
ELECTRODE REM PT RTRN 9FT ADLT (ELECTROSURGICAL) ×1 IMPLANT
EVACUATOR SILICONE 100CC (DRAIN) ×1 IMPLANT
GLOVE BIO SURGEON STRL SZ7.5 (GLOVE) ×3 IMPLANT
GLOVE SURG ORTHO 8.0 STRL STRW (GLOVE) ×1 IMPLANT
GOWN STRL REUS W/ TWL LRG LVL3 (GOWN DISPOSABLE) ×2 IMPLANT
GOWN STRL REUS W/TWL LRG LVL3 (GOWN DISPOSABLE) ×4
GOWN STRL REUS W/TWL XL LVL3 (GOWN DISPOSABLE) ×2 IMPLANT
ILLUMINATOR WAVEGUIDE N/F (MISCELLANEOUS) IMPLANT
KIT MARKER MARGIN INK (KITS) ×2 IMPLANT
LIGHT WAVEGUIDE WIDE FLAT (MISCELLANEOUS) IMPLANT
NDL SAFETY ECLIPSE 18X1.5 (NEEDLE) IMPLANT
NEEDLE HYPO 18GX1.5 SHARP (NEEDLE)
NEEDLE HYPO 25X1 1.5 SAFETY (NEEDLE) ×2 IMPLANT
NS IRRIG 1000ML POUR BTL (IV SOLUTION) ×1 IMPLANT
PACK BASIN DAY SURGERY FS (CUSTOM PROCEDURE TRAY) ×2 IMPLANT
PENCIL BUTTON HOLSTER BLD 10FT (ELECTRODE) ×2 IMPLANT
PIN SAFETY STERILE (MISCELLANEOUS) ×1 IMPLANT
SLEEVE SCD COMPRESS KNEE MED (MISCELLANEOUS) ×2 IMPLANT
SPONGE LAP 18X18 RF (DISPOSABLE) ×3 IMPLANT
SUT ETHILON 3 0 PS 1 (SUTURE) ×1 IMPLANT
SUT MON AB 4-0 PC3 18 (SUTURE) ×4 IMPLANT
SUT SILK 2 0 SH (SUTURE) IMPLANT
SUT VICRYL 3-0 CR8 SH (SUTURE) ×2 IMPLANT
SYR CONTROL 10ML LL (SYRINGE) ×2 IMPLANT
TOWEL GREEN STERILE FF (TOWEL DISPOSABLE) ×2 IMPLANT
TRAY FAXITRON CT DISP (TRAY / TRAY PROCEDURE) ×2 IMPLANT
TUBE CONNECTING 20X1/4 (TUBING) ×1 IMPLANT
YANKAUER SUCT BULB TIP NO VENT (SUCTIONS) ×1 IMPLANT

## 2018-07-12 NOTE — H&P (Signed)
Christy Serrano  Location: [facility] Surgery Patient #: [id number] DOB: [birth year] Single / Language: Cleophus Molt / Race: Black or African American Female   History of Present Illness  The patient is a 63 year old female who presents for a follow-up for Breast cancer. 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 The original mass measured about 2-1/2 cm with 5 cm of enhancement anterior to the mass. She was treated with neoadjuvant chemotherapy and tolerated this fairly well. Her recent MRI showed no residual enhancement in the left breast. The abnormal appearing lymph nodes were about half the size but still abnormal appearing. Her last dose of chemotherapy was this past Thursday. She is now ready to schedule her definitive surgery.   Allergies  Amoxicillin *PENICILLINS*  Percocet *ANALGESICS - OPIOID*  Allergies Reconciled   Medication History Estradiol (0.075MG /24HR Patch TW, Transdermal) Active. Ventolin HFA (108 (90 Base)MCG/ACT Aerosol Soln, Inhalation) Active. Aspirin (81MG  Tablet DR, Oral) Active. Calcium 1200 (1200-1000MG -UNIT Tablet Chewable, Oral) Active. Vitamin D3 (Oral) Specific strength unknown - Active. Multi Vitamin (Oral) Active. Dexamethasone (0.5MG  Tablet, Oral) Active. LORazepam (0.5MG  Tablet, Oral) Active. Gabapentin (300MG  Capsule, Oral) Active. Furosemide (20MG  Tablet, Oral) Active. Protonix (20MG  Tablet DR, Oral) Active. Compazine (5MG  Tablet, Oral) Active. Medications Reconciled    Review of Systems  General Present- Weight Loss. Not Present- Appetite Loss, Chills, Fatigue, Fever, Night Sweats and Weight Gain. Skin Not Present- Change in Wart/Mole, Dryness, Hives, Jaundice, New Lesions, Non-Healing Wounds, Rash and Ulcer. HEENT Present- Wears glasses/contact lenses. Not Present- Earache, Hearing Loss, Hoarseness, Nose Bleed,  Oral Ulcers, Ringing in the Ears, Seasonal Allergies, Sinus Pain, Sore Throat, Visual Disturbances and Yellow Eyes. Respiratory Not Present- Bloody sputum, Chronic Cough, Difficulty Breathing, Snoring and Wheezing. Breast Not Present- Breast Mass, Breast Pain, Nipple Discharge and Skin Changes. Cardiovascular Not Present- Chest Pain, Difficulty Breathing Lying Down, Leg Cramps, Palpitations, Rapid Heart Rate, Shortness of Breath and Swelling of Extremities. Gastrointestinal Not Present- Abdominal Pain, Bloating, Bloody Stool, Change in Bowel Habits, Chronic diarrhea, Constipation, Difficulty Swallowing, Excessive gas, Gets full quickly at meals, Hemorrhoids, Indigestion, Nausea, Rectal Pain and Vomiting. Musculoskeletal Not Present- Back Pain, Joint Pain, Joint Stiffness, Muscle Pain, Muscle Weakness and Swelling of Extremities. Neurological Not Present- Decreased Memory, Fainting, Headaches, Numbness, Seizures, Tingling, Tremor, Trouble walking and Weakness. Psychiatric Not Present- Anxiety, Bipolar, Change in Sleep Pattern, Depression, Fearful and Frequent crying. Endocrine Not Present- Cold Intolerance, Excessive Hunger, Hair Changes, Heat Intolerance and New Diabetes. Hematology Present- Blood Thinners. Not Present- Easy Bruising, Excessive bleeding, Gland problems, HIV and Persistent Infections.  Vitals Weight: 179.2 lb Height: 65.5in Body Surface Area: 1.9 m Body Mass Index: 29.37 kg/m  Temp.: 98.44F  Pulse: 94 (Regular)  BP: 142/78 (Sitting, Left Arm, Standard)       Physical Exam  General Mental Status-Alert. General Appearance-Consistent with stated age. Hydration-Well hydrated. Voice-Normal.  Head and Neck Head-normocephalic, atraumatic with no lesions or palpable masses. Trachea-midline. Thyroid Gland Characteristics - normal size and consistency.  Eye Eyeball - Bilateral-Extraocular movements intact. Sclera/Conjunctiva - Bilateral-No  scleral icterus.  Chest and Lung Exam Chest and lung exam reveals -quiet, even and easy respiratory effort with no use of accessory muscles and on auscultation, normal breath sounds, no adventitious sounds and normal vocal resonance. Inspection Chest Wall - Normal. Back - normal.  Breast Note: There is no palpable mass in either breast. There is a  palpably enlarged lymph node in the left axilla. There is no other supraclavicular or cervical or right axillary lymphadenopathy.   Cardiovascular Cardiovascular examination reveals -normal heart sounds, regular rate and rhythm with no murmurs and normal pedal pulses bilaterally.  Abdomen Inspection Inspection of the abdomen reveals - No Hernias. Skin - Scar - no surgical scars. Palpation/Percussion Palpation and Percussion of the abdomen reveal - Soft, Non Tender, No Rebound tenderness, No Rigidity (guarding) and No hepatosplenomegaly. Auscultation Auscultation of the abdomen reveals - Bowel sounds normal.  Neurologic Neurologic evaluation reveals -alert and oriented x 3 with no impairment of recent or remote memory. Mental Status-Normal.  Musculoskeletal Normal Exam - Left-Upper Extremity Strength Normal and Lower Extremity Strength Normal. Normal Exam - Right-Upper Extremity Strength Normal and Lower Extremity Strength Normal.  Lymphatic Head & Neck  General Head & Neck Lymphatics: Bilateral - Description - Normal. Axillary  General Axillary Region: Bilateral - Description - Normal. Tenderness - Non Tender. Femoral & Inguinal  Generalized Femoral & Inguinal Lymphatics: Bilateral - Description - Normal. Tenderness - Non Tender.    Assessment & Plan MALIGNANT NEOPLASM OF UPPER-OUTER QUADRANT OF LEFT BREAST IN FEMALE, ESTROGEN RECEPTOR NEGATIVE (C50.412) Impression: The patient has a known left breast cancer that was locally advanced. She received neoadjuvant chemotherapy and appears to have had a complete  radiologic response in the left breast. The abnormal lymph nodes in the left axilla are smaller but still present and abnormal appearing. I have talked her about the different options for treatment and at this point she favors breast conservation. She will still require a left axillary lymph node dissection. I have discussed with her in detail the risks and benefits of the operation as well as some the technical aspects and she understands and wishes to proceed. She will likely need to radioactive seed localized lumpectomies in the left breast.

## 2018-07-12 NOTE — Anesthesia Postprocedure Evaluation (Signed)
Anesthesia Post Note  Patient: Christy Serrano  Procedure(s) Performed: LEFT BREAST RADIOACTIVE SEED X 3 LUMPECTOMY X 3  AND AXILLARY LYMPH NODE DISSECTION (Left Breast)     Patient location during evaluation: PACU Anesthesia Type: General Level of consciousness: awake and alert Pain management: pain level controlled Vital Signs Assessment: post-procedure vital signs reviewed and stable Respiratory status: spontaneous breathing, nonlabored ventilation, respiratory function stable and patient connected to nasal cannula oxygen Cardiovascular status: blood pressure returned to baseline and stable Postop Assessment: no apparent nausea or vomiting Anesthetic complications: no    Last Vitals:  Vitals:   07/12/18 1130 07/12/18 1145  BP: 110/67 117/64  Pulse: 74 70  Resp: 11 12  Temp:    SpO2: 98% 100%    Last Pain:  Vitals:   07/12/18 1143  TempSrc:   PainSc: 5                  Argie Applegate S

## 2018-07-12 NOTE — Anesthesia Procedure Notes (Signed)
Procedure Name: LMA Insertion Date/Time: 07/12/2018 9:33 AM Performed by: Justice Rocher, CRNA Pre-anesthesia Checklist: Patient identified, Emergency Drugs available, Suction available and Patient being monitored Patient Re-evaluated:Patient Re-evaluated prior to induction Oxygen Delivery Method: Circle system utilized Preoxygenation: Pre-oxygenation with 100% oxygen Induction Type: IV induction Ventilation: Mask ventilation without difficulty LMA: LMA inserted LMA Size: 4.0 Number of attempts: 1 Airway Equipment and Method: Bite block Placement Confirmation: positive ETCO2 and breath sounds checked- equal and bilateral Tube secured with: Tape Dental Injury: Teeth and Oropharynx as per pre-operative assessment

## 2018-07-12 NOTE — Interval H&P Note (Signed)
History and Physical Interval Note:  07/12/2018 9:11 AM  Christy Serrano  has presented today for surgery, with the diagnosis of left breast cancer.  The various methods of treatment have been discussed with the patient and family. After consideration of risks, benefits and other options for treatment, the patient has consented to  Procedure(s): LEFT BREAST RADIOACTIVE SEED X 3 LUMPECTOMY X 3 AND AXILLARY LYMPH NODE DISSECTION (Left) as a surgical intervention.  The patient's history has been reviewed, patient examined, no change in status, stable for surgery.  I have reviewed the patient's chart and labs.  Questions were answered to the patient's satisfaction.     [name]

## 2018-07-12 NOTE — Anesthesia Preprocedure Evaluation (Signed)
Anesthesia Evaluation  Patient identified by MRN, date of birth, ID band Patient awake    Reviewed: Allergy & Precautions, NPO status , Patient's Chart, lab work & pertinent test results  History of Anesthesia Complications (+) PONV  Airway Mallampati: II  TM Distance: >3 FB Neck ROM: Full    Dental no notable dental hx.    Pulmonary neg pulmonary ROS,    Pulmonary exam normal breath sounds clear to auscultation       Cardiovascular negative cardio ROS Normal cardiovascular exam Rhythm:Regular Rate:Normal     Neuro/Psych negative neurological ROS  negative psych ROS   GI/Hepatic Neg liver ROS, GERD  ,  Endo/Other  negative endocrine ROS  Renal/GU negative Renal ROS  negative genitourinary   Musculoskeletal negative musculoskeletal ROS (+)   Abdominal   Peds negative pediatric ROS (+)  Hematology negative hematology ROS (+)   Anesthesia Other Findings   Reproductive/Obstetrics negative OB ROS                             Anesthesia Physical Anesthesia Plan  ASA: II  Anesthesia Plan: General   Post-op Pain Management:    Induction: Intravenous  PONV Risk Score and Plan: 4 or greater and Ondansetron, Dexamethasone, Midazolam, Scopolamine patch - Pre-op and Treatment may vary due to age or medical condition  Airway Management Planned: LMA and Oral ETT  Additional Equipment:   Intra-op Plan:   Post-operative Plan: Extubation in OR  Informed Consent: I have reviewed the patients History and Physical, chart, labs and discussed the procedure including the risks, benefits and alternatives for the proposed anesthesia with the patient or authorized representative who has indicated his/her understanding and acceptance.     Dental advisory given  Plan Discussed with: CRNA and Surgeon  Anesthesia Plan Comments:         Anesthesia Quick Evaluation

## 2018-07-12 NOTE — Transfer of Care (Signed)
  Immediate Anesthesia Transfer of Care Note  Patient: Christy Serrano  Procedure(s) Performed: Procedure(s) (LRB): LEFT BREAST RADIOACTIVE SEED X 3 LUMPECTOMY X 3  AND AXILLARY LYMPH NODE DISSECTION (Left)  Patient Location: PACU  Anesthesia Type: General  Level of Consciousness: awake, sedated, patient cooperative and responds to stimulation  Airway & Oxygen Therapy: Patient Spontanous Breathing and Patient connected to face mask oxygen w/ cloth mask over FM  Post-op Assessment: Report given to PACU RN, Post -op Vital signs reviewed and stable and Patient moving all extremities  Post vital signs: Reviewed and stable  Complications: No apparent anesthesia complications

## 2018-07-12 NOTE — Discharge Instructions (Signed)
DISCHARGE INSTRUCTIONS   Call MD for: difficulty breathing, headache or visual disturbances   Call MD for: extreme fatigue   Call MD for: hives   Call MD for: persistant dizziness or light-headedness   Call MD for: persistant nausea and vomiting   Call MD for: redness, tenderness, or signs of infection (pain, swelling, redness, odor or green/yellow discharge around incision site)   Call MD for: severe uncontrolled pain   Call MD for: temperature >100.4   Diet - low sodium heart healthy   Discharge instructions   Sponge bathe while drain is in. No overhead activity with left arm. Empty drain, record output, recharge bulb twice a day  Increase activity slowly   No wound care     Wear breast binder for comfort especially while ambulating.   Post Anesthesia Home Care Instructions  Activity: Get plenty of rest for the remainder of the day. A responsible individual must stay with you for 24 hours following the procedure.  For the next 24 hours, DO NOT: -Drive a car -Paediatric nurse -Drink alcoholic beverages -Take any medication unless instructed by your physician -Make any legal decisions or sign important papers.  Meals: Start with liquid foods such as gelatin or soup. Progress to regular foods as tolerated. Avoid greasy, spicy, heavy foods. If nausea and/or vomiting occur, drink only clear liquids until the nausea and/or vomiting subsides. Call your physician if vomiting continues.  Special Instructions/Symptoms: Your throat may feel dry or sore from the anesthesia or the breathing tube placed in your throat during surgery. If this causes discomfort, gargle with warm salt water. The discomfort should disappear within 24 hours.  If you had a scopolamine patch placed behind your ear for the management of post- operative nausea and/or vomiting:  1. The medication in the patch is effective for 72 hours, after which it should be removed.  Wrap patch in a tissue and  discard in the trash. Wash hands thoroughly with soap and water. 2. You may remove the patch earlier than 72 hours if you experience unpleasant side effects which may include dry mouth, dizziness or visual disturbances. 3. Avoid touching the patch. Wash your hands with soap and water after contact with the patch.    About my Jackson-Pratt Bulb Drain  What is a Jackson-Pratt bulb? A Jackson-Pratt is a soft, round device used to collect drainage. It is connected to a long, thin drainage catheter, which is held in place by one or two small stiches near your surgical incision site. When the bulb is squeezed, it forms a vacuum, forcing the drainage to empty into the bulb.  Emptying the Jackson-Pratt bulb- To empty the bulb: 1. Release the plug on the top of the bulb. 2. Pour the bulb's contents into a measuring container which your nurse will provide. 3. Record the time emptied and amount of drainage. Empty the drain(s) as often as your     doctor or nurse recommends.  Date                  Time                    Amount (Drain 1)                 Amount (Drain 2)  _____________________________________________________________________  _____________________________________________________________________  _____________________________________________________________________  _____________________________________________________________________  _____________________________________________________________________  _____________________________________________________________________  _____________________________________________________________________  _____________________________________________________________________  Squeezing the Jackson-Pratt Bulb- To squeeze the bulb: 1. Make sure the plug at the  top of the bulb is open. 2. Squeeze the bulb tightly in your fist. You will hear air squeezing from the bulb. 3. Replace the plug while the bulb is squeezed. 4. Use a safety pin to  attach the bulb to your clothing. This will keep the catheter from     pulling at the bulb insertion site.  When to call your doctor- Call your doctor if:  Drain site becomes red, swollen or hot.  You have a fever greater than 101 degrees F.  There is oozing at the drain site.  Drain falls out (apply a guaze bandage over the drain hole and secure it with tape).  Drainage increases daily not related to activity patterns. (You will usually have more drainage when you are active than when you are resting.)  Drainage has a bad odor.

## 2018-07-12 NOTE — Op Note (Addendum)
07/12/2018  11:05 AM  PATIENT:  Christy Serrano  63 y.o. female  PRE-OPERATIVE DIAGNOSIS:  left breast cancer  POST-OPERATIVE DIAGNOSIS:  left breast cancer  PROCEDURE:  Procedure(s): LEFT BREAST RADIOACTIVE SEED LOCALIZED LUMPECTOMY X 3 AND DEEP LEFT AXILLARY LYMPH NODE DISSECTION (Left)  SURGEON:  Surgeon(s) and Role:    * Jovita Kussmaul, MD - Primary    * Armandina Gemma, MD - Assisting  PHYSICIAN ASSISTANT:   ASSISTANTS: Dr. Harlow Asa   ANESTHESIA:   local and general  EBL:  minimal   BLOOD ADMINISTERED:none  DRAINS: (1) Jackson-Pratt drain(s) with closed bulb suction in the left axilla   LOCAL MEDICATIONS USED:  MARCAINE     SPECIMEN:  Source of Specimen:  left breast tissue and axillary contents  DISPOSITION OF SPECIMEN:  PATHOLOGY  COUNTS:  YES  TOURNIQUET:  * No tourniquets in log *  DICTATION: .Dragon Dictation   After informed consent was obtained the patient was brought to the operating room and placed in the supine position on the operating table.  After adequate induction of general anesthesia the patient's left chest, breast, and axillary area were prepped with ChloraPrep, allowed to dry, and draped in usual sterile manner.  An appropriate timeout was performed.  Previously 3 I-125 seeds were placed in the upper outer quadrant of the left breast to mark areas of invasive breast cancer.  She has undergone neoadjuvant chemotherapy and had a complete pathologic response.  She still has abnormal appearing lymph nodes in the left axilla and will also need a left axillary lymph node dissection today.  The neoprobe was set to I-125 in the area of radioactivity was readily identified.  A radial type elliptical incision was made in the skin overlying the radioactivity.  The incision was carried through the skin and subcutaneous tissue sharply with electrocautery.  A large oblong area of breast tissue was then excised sharply with the electrocautery all the way down to the  chest wall making sure to incorporate all 3 areas where the radioactive seeds were.  Once this tissue was removed it was oriented with the appropriate paint colors.  A specimen radiograph was obtained that showed the clips and seeds to all be within the specimen.  The specimen was then sent to pathology for further evaluation.  Because of the location of the incision we were able to extend it laterally slightly and through this incision we were able to enter the deep left axillary space.  I was able to identify the serratus muscle medially, the latissimus muscle laterally, and the axillary vein superiorly.  The contents of the left axilla within these boundaries was then dissected out bluntly with a right angle clamp.  Several small vessels and lymphatics and intercostal brachial nerves were controlled with clips.  Once this dissection was done then the entire left axillary contents were removed en bloc and sent to pathology for further evaluation.  The long thoracic and thoracodorsal nerves were identified and spared.  Hemostasis was achieved using the Bovie electrocautery.  The wound was irrigated with copious amounts of saline.  The cavity of the lumpectomy was marked with clips.  A small stab incision was made near the anterior axillary line inferior to the operative bed with a 15 blade knife.  A tonsil clamp was placed through this opening and used to bring a 19 [place] round Blake drain into the operative bed.  The drain was placed in the left axilla.  The drain was anchored to  the skin with a 3-0 nylon stitch.  The deep layer of the wound was then closed with layers of interrupted 3-0 Vicryl stitches.  The skin was then closed with a running 4-0 Monocryl subcuticular stitch.  Dermabond dressings were then applied.  The drain was placed to bulb suction and there was a good seal.  The patient tolerated the procedure well.  At the end of the case all needle sponge and instrument counts were correct.  The patient  was then awakened and taken to recovery in stable condition. The assistant was crucial for retraction and visualization in order to complete the operation.  PLAN OF CARE: Admit for overnight observation  PATIENT DISPOSITION:  PACU - hemodynamically stable.   Delay start of Pharmacological VTE agent (>24hrs) due to surgical blood loss or risk of bleeding: no

## 2018-07-13 ENCOUNTER — Other Ambulatory Visit: Payer: Self-pay | Admitting: *Deleted

## 2018-07-13 ENCOUNTER — Encounter (HOSPITAL_BASED_OUTPATIENT_CLINIC_OR_DEPARTMENT_OTHER): Payer: Self-pay | Admitting: General Surgery

## 2018-07-13 ENCOUNTER — Telehealth: Payer: Self-pay | Admitting: Emergency Medicine

## 2018-07-13 ENCOUNTER — Other Ambulatory Visit: Payer: Self-pay | Admitting: Medical

## 2018-07-13 DIAGNOSIS — Z171 Estrogen receptor negative status [ER-]: Principal | ICD-10-CM

## 2018-07-13 DIAGNOSIS — C50812 Malignant neoplasm of overlapping sites of left female breast: Secondary | ICD-10-CM

## 2018-07-13 DIAGNOSIS — C50412 Malignant neoplasm of upper-outer quadrant of left female breast: Secondary | ICD-10-CM

## 2018-07-13 MED ORDER — POTASSIUM CHLORIDE CRYS ER 20 MEQ PO TBCR: 20.0000 meq | EXTENDED_RELEASE_TABLET | Freq: Every day | ORAL | 1 refills | Status: DC

## 2018-07-13 NOTE — Telephone Encounter (Signed)
Pt called requesting refill of potassium 20 meQ for a 90 day supply from [facility].  Approved, sent by Lucianne Lei to pt's requested pharmacy.  Pt VU.

## 2018-07-14 ENCOUNTER — Telehealth: Payer: Self-pay | Admitting: Emergency Medicine

## 2018-07-14 NOTE — Telephone Encounter (Signed)
Returning pt's phone call stating that she has been experiencing bilat lower leg edema since starting gabapentin several weeks ago per [facility] Lucianne Lei.  Pt takes lasix and elevates legs which she reports helps but is worried about the excessive swelling being caused by the gabapentin.  Spoke with PA Lucianne Lei, since pt is on low enough dose of gabapentin she is advised to stop this medication altogether.  Pt VU of plan, also states that tramadol has been helping with her neuropathic pain and feels comfortable with stopping the gabapentin.  Pt states she will contact [facility] again if swelling does not improve or if pain/neuropathy returns or increases, or if she has any other questions or concerns.

## 2018-07-15 ENCOUNTER — Telehealth: Payer: Self-pay | Admitting: Hematology and Oncology

## 2018-07-15 NOTE — Telephone Encounter (Signed)
Called regarding upcoming [facility] appointment, patient is notified and invite has been sent.

## 2018-07-19 ENCOUNTER — Other Ambulatory Visit: Payer: Self-pay | Admitting: *Deleted

## 2018-07-19 ENCOUNTER — Telehealth: Payer: Self-pay | Admitting: *Deleted

## 2018-07-19 ENCOUNTER — Other Ambulatory Visit: Payer: Self-pay | Admitting: Hematology and Oncology

## 2018-07-19 MED ORDER — BUMETANIDE 1 MG PO TABS: 1.0000 mg | ORAL_TABLET | Freq: Two times a day (BID) | ORAL | 0 refills | Status: DC

## 2018-07-19 NOTE — Telephone Encounter (Signed)
Received call from pt stating she is still experiencing swelling in her legs.  She is no longer taking gabapentin and is still taking her lasix's but is continuing to having swelling.  Per Dr. Lindi Adie pt to stop taking lasix's and to start taking bumex 1 mg BID. Prescription sent to pt pharmacy and Pt verbalized understanding.

## 2018-07-19 NOTE — Progress Notes (Signed)
HEMATOLOGY-ONCOLOGY [place] VISIT PROGRESS NOTE  I connected with Christy Serrano on 07/20/2018 at 11:00 AM EDT by Webex video conference and verified that I am speaking with the correct person using two identifiers.  I discussed the limitations, risks, security and privacy concerns of performing an evaluation and management service by Webex and the availability of in person appointments.  I also discussed with the patient that there may be a patient responsible charge related to this service. The patient expressed understanding and agreed to proceed.  Patient's Location: Home Physician Location: Clinic  CHIEF COMPLIANT: Follow-up s/p lumpectomy to review pathology  INTERVAL HISTORY: Christy Serrano is a 63 y.o. female with above-mentioned history of left breast cancer who completed 6 cycles of neoadjuvant chemotherapy with TCHP and underwent a lumpectomy on 07/12/18. Pathology confirmed no residual cancer in the breast and 11/11 lymph nodes negative for cancer.  Unfortunately she is currently having problems with peripheral neuropathy.  She could not tolerate gabapentin.  She is also having swelling of her legs for which she took Lasix.  Has not fully helped her.  Yesterday Lasix was changed to Bumex.    Malignant neoplasm of left female breast ([place])   01/18/2018 Initial Diagnosis    Screening mammogram detected microcalcifications and asymmetry, left breast segmental microcalcifications UOQ 1.5 x 4.3 x 5.4 cm biopsy-proven IDC with DCIS with LV I, grade 3, ER 0%, PR 0%, Ki-67 30%, HER-2 +3+ by IHC; hypoechoic mass 2 o'clock position 1.2 x 1.3 x 1.7 cm biopsy IDC, LV I present, grade 2-3, ER 0%, PR 0%, Ki-67 40%, HER-2 +3+, left axillary lymph node positive (3 nodes noted by [place]) T3 N1 stage IIIA    02/03/2018 Cancer Staging    Staging form: Breast, AJCC 8th Edition - Clinical: Stage IIIA (cT3, cN1, cM0, G3, ER-, PR-, HER2+) - Signed by Nicholas Lose, MD on 02/03/2018    02/20/2018 Genetic  Testing    No pathogenic variants identified on the [facility] Expanded + RNA insight panel. Genes Analyzed (67 total): AIP, ALK, APC*, ATM*, BAP1, BARD1, BLM, BMPR1A, BRCA1*, BRCA2*, BRIP1*, CDH1*, CDK4, CDKN1B, CDKN2A, CHEK2*, DICER1, FANCC, FH, FLCN, GALNT12, HOXB13, MAX, MEN1, MET, MLH1*, MRE11A, MSH2*, MSH6*, MUTYH*, NBN, NF1*, NF2, PALB2*, PHOX2B, PMS2*, POLD1, POLE, POT1, PRKAR1A, PTCH1, PTEN*, RAD50, RAD51C*, RAD51D*, RB1, RET, SDHA, SDHAF2, SDHB, SDHC, SDHD, SMAD4, SMARCA4, SMARCB1, SMARCE1, STK11, SUFU, TMEM127, TP53*, TSC1, TSC2, VHL and XRCC2 (sequencing and deletion/duplication); MITF (sequencing only); EPCAM and GREM1 (deletion/duplication only). DNA and RNA analyses performed for * genes. The report date is 02/20/2018.    03/04/2018 -  Neo-Adjuvant Chemotherapy    Neoadjuvant chemotherapy with [facility] Perjeta    03/08/2018 - 03/10/2018 Hospital Admission    Admitted for recurrent syncope with loss of consciousness for up to 5 minutes with loss of bladder, no abnormalities identified.  MRI brain, carotid ultrasound, EEG studies were negative.    06/17/2018 Breast MRI    No suspicious residual enhancement identified in the left breast, compatible with treatment response, interval decrease in size of left axillary lymph nodes    07/12/2018 Surgery    Lumpectomy (Dr. Marlou Starks): complete treatment response, no residual carcinoma present in breast or 11/11 lymph nodes.      REVIEW OF SYSTEMS:   Constitutional: Denies fevers, chills or abnormal weight loss Eyes: Denies blurriness of vision Ears, nose, mouth, throat, and face: Denies mucositis or sore throat Respiratory: Denies cough, dyspnea or wheezes Cardiovascular: Denies palpitation, chest discomfort Gastrointestinal:  Denies nausea, heartburn  or change in bowel habits Skin: Denies abnormal skin rashes Lymphatics: Denies new lymphadenopathy or easy bruising Neurological:Denies numbness, tingling or new weaknesses Behavioral/Psych:  Mood is stable, no new changes  Extremities: No lower extremity edema Breast: denies any pain or lumps or nodules in either breasts All other systems were reviewed with the patient and are negative.  Observations/Objective:  There were no vitals filed for this visit. There is no height or weight on file to calculate BMI.  I have reviewed the data as listed CMP Latest Ref Rng & Units 06/17/2018 05/27/2018 05/07/2018  Glucose 70 - 99 mg/dL 92 117(H) 115(H)  BUN 6 - 20 mg/dL _0 Creatinine 0.44 - 1.00 mg/dL 0.82 0.83 0.88  Sodium 135 - 145 mmol/L 139 137 138  Potassium 3.5 - 5.1 mmol/L 3.9 4.3 4.5  Chloride 98 - 111 mmol/L 105 107 106  CO2 22 - 32 mmol/L _1 Calcium 8.9 - 10.3 mg/dL 7.9(L) 9.0 9.2  Total Protein 6.5 - 8.1 g/dL 6.2(L) 6.6 6.8  Total Bilirubin 0.3 - 1.2 mg/dL 0.6 0.3 0.6  Alkaline Phos 38 - 126 U/L 71 70 94  AST 15 - 41 U/L 19 49(H) 53(H)  ALT 0 - 44 U/L 11 32 37    Lab Results  Component Value Date   WBC 4.5 06/17/2018   HGB 8.5 (L) 06/17/2018   HCT 26.9 (L) 06/17/2018   MCV 100.4 (H) 06/17/2018   PLT 195 06/17/2018   NEUTROABS 2.2 06/17/2018      Assessment Plan:  Malignant neoplasm of left female breast ([place]) 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, grade 3, ER 0%, PR 0%, Ki-67 30%, HER-2 +3+ by IHC; hypoechoic mass 2 o'clock position 1.2 x 1.3 x 1.7 cm biopsy IDC, LV I present, grade 2-3, ER 0%, PR 0%, Ki-67 40%, HER-2 +3+, left axillary lymph node positive (3 nodes noted by [place]) T3 N1 stage IIIA  Treatment plan 1.Neoadjuvant chemotherapy with TCH Perjeta completed 06/17/2018 followed by Herceptin Perjeta maintenance versus Kadcyla maintenance  2. lumpectomy with axillary lymph node dissection: 07/12/2018 3.Adjuvant radiation therapy  --------------------------------------------------------------------------------------------------------------------------------------------------- Breast MRI 06/18/2018:No suspicious residual enhancement identified in the left breast, compatible with treatment response, interval decrease in size of left axillary lymph nodes 07/12/2018:Lumpectomy (Dr. Marlou Starks): complete treatment response, no residual carcinoma present in breast or 11/11 lymph nodes.   Pathology counseling: I discussed the final pathology report of the patient provided  a copy of this report. I discussed the margins as well as lymph node surgeries. We also discussed the final staging along with previously performed ER/PR and HER-2/neu testing.  Treatment plan:  1. Adjuvant radiation therapy 2. Herceptin Perjeta maintenance  Chemo-induced peripheral neuropathy: Could not tolerate gabapentin it was discontinued. Lower extremity edema: I started her on Bumex yesterday.  Patient will come back every 3 weeks for Herceptin Perjeta and every 6 weeks for follow-up with me.    I discussed the assessment and treatment plan with the patient. The patient was provided an opportunity to ask questions and all were answered. The patient agreed with the plan and demonstrated an understanding of the instructions. The patient was advised to call back or seek an in-person evaluation if the symptoms worsen or if the condition fails to improve as anticipated.   I provided 15 minutes of face-to-face Web Ex time during this encounter.    Zechariah Bissonnette  Loyal Gambler, MD 07/20/2018   I, Molly Dorshimer, am acting as scribe for Nicholas Lose, MD.  I have reviewed the above documentation for accuracy and completeness, and I agree with the above.

## 2018-07-20 ENCOUNTER — Inpatient Hospital Stay (HOSPITAL_BASED_OUTPATIENT_CLINIC_OR_DEPARTMENT_OTHER): Payer: Managed Care, Other (non HMO) | Admitting: Hematology and Oncology

## 2018-07-20 DIAGNOSIS — Z923 Personal history of irradiation: Secondary | ICD-10-CM

## 2018-07-20 DIAGNOSIS — Z171 Estrogen receptor negative status [ER-]: Secondary | ICD-10-CM

## 2018-07-20 DIAGNOSIS — C50412 Malignant neoplasm of upper-outer quadrant of left female breast: Secondary | ICD-10-CM | POA: Diagnosis not present

## 2018-07-20 NOTE — Assessment & Plan Note (Signed)
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, grade 3, ER 0%, PR 0%, Ki-67 30%, HER-2 +3+ by IHC; hypoechoic mass 2 o'clock position 1.2 x 1.3 x 1.7 cm biopsy IDC, LV I present, grade 2-3, ER 0%, PR 0%, Ki-67 40%, HER-2 +3+, left axillary lymph node positive (3 nodes noted by [place]) T3 N1 stage IIIA  Treatment plan 1.Neoadjuvant chemotherapy with TCH Perjeta completed 06/17/2018 followed by Herceptin Perjeta maintenance versus Kadcyla maintenance  2. lumpectomy with axillary lymph node dissection: 07/12/2018 3.Adjuvant radiation therapy --------------------------------------------------------------------------------------------------------------------------------------------------- Breast MRI 06/18/2018:No suspicious residual enhancement identified in the left breast, compatible with treatment response, interval decrease in size of left axillary lymph nodes 07/12/2018:Lumpectomy (Dr. Marlou Starks): complete treatment response, no residual carcinoma present in breast or 11/11 lymph nodes.   Pathology counseling: I discussed the final pathology report of the patient provided  a copy of this report. I discussed the margins as well as lymph node surgeries. We also discussed the final staging along with previously performed ER/PR and HER-2/neu testing.  Treatment plan:  1. Adjuvant radiation therapy 2. Herceptin Perjeta maintenance  Chemo-induced peripheral neuropathy: Could not tolerate gabapentin it was discontinued. Lower extremity edema: I started her on Bumex yesterday.  Patient will come back every 3 weeks for Herceptin Perjeta and every 6 weeks for follow-up with me.

## 2018-07-30 ENCOUNTER — Other Ambulatory Visit: Payer: Managed Care, Other (non HMO)

## 2018-07-30 ENCOUNTER — Ambulatory Visit: Payer: Managed Care, Other (non HMO)

## 2018-08-02 ENCOUNTER — Telehealth: Payer: Self-pay | Admitting: *Deleted

## 2018-08-02 ENCOUNTER — Other Ambulatory Visit: Payer: Self-pay

## 2018-08-02 ENCOUNTER — Encounter: Payer: Self-pay | Admitting: Hematology and Oncology

## 2018-08-02 NOTE — Telephone Encounter (Signed)
MR faxed to [place]; [place] [phone]

## 2018-08-06 NOTE — Progress Notes (Signed)
Location of Breast Cancer: Left Breast  Histology per Pathology Report:  01/18/18 Diagnosis 1. 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 - THERE IS NO EVIDENCE OF MALIGNANCY. - SEE COMMENT. 2. Breast, left, needle core biopsy, 2 o'clock - INVASIVE MAMMARY CARCINOMA. - LYMPHOVASCULAR INVASION IS IDENTIFIED.  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 Lymph node, needle/core biopsy, left axilla - METASTATIC CARCINOMA IN 1 OF 1 LYMPH NODE (1/1).  Receptor Status: ER (NEG), PR(NEG), Ki-(30%), Her2-neu(POS)  07/12/18 Diagnosis 1. Breast, lumpectomy, Left w/seed - COMPLETE THERAPEUTIC RESPONSE-NO RESIDUAL CARCINOMA - PREVIOUS PROCEDURE AND THERAPY-RELATED CHANGES - SEE ONCOLOGY TABLE 2. Lymph nodes, regional resection, Left Axillary contents - ELEVEN LYMPH NODES WITH FEW SHOWING FIBROSIS AND CHANGES CONSISTENT WITH THERAPY-EFFECT - NEGATIVE FOR CARCINOMA (0/11)  Did patient present with symptoms or was this found on screening mammography?: It was found on a screening mammogram.   Past/Anticipated interventions by surgeon, if any: 07/12/18 PROCEDURE:  Procedure(s): LEFT BREAST RADIOACTIVE SEED LOCALIZED LUMPECTOMY X 3 AND DEEP LEFT AXILLARY LYMPH NODE DISSECTION (Left) SURGEON:  Surgeon(s) and Role:    * Jovita Kussmaul, MD - Primary    Armandina Gemma, MD - Assisting  --drain removed yesterday by Dr. Marlou Starks.    Past/Anticipated interventions by medical oncology, if any:  Dr. Lindi Adie 07/20/18 Treatment plan 1.Neoadjuvant chemotherapy with [place] completed 3/26/2020followed by Herceptin Perjeta maintenance versus Kadcyla maintenance  2.lumpectomywith axillary lymphnode dissection: 07/12/2018 3.Adjuvant radiation  therapy --------------------------------------------------------------------------------------------------------------------------------Breast MRI 06/18/2018:No suspicious residual enhancement identified in the left breast, compatible with treatment response, interval decrease in size of left axillary lymph nodes 07/12/2018:Lumpectomy (Dr. Marlou Starks): complete treatment response, no residual carcinoma present in breast or 11/11 lymph nodes.  Treatment plan:  1. Adjuvant radiation therapy 2. Herceptin Perjeta maintenance  Chemo-induced peripheral neuropathy: Could not tolerate gabapentin it was discontinued. Lower extremity edema: I started her on Bumex yesterday.  Patient will come back every 3 weeks for Herceptin Perjeta and every 6 weeks for follow-up with me.   Lymphedema issues, if any:  She denies.    Pain issues, if any:  She reports occasional soreness to her left nipple.    SAFETY ISSUES:  Prior radiation? No  Pacemaker/ICD? No  Possible current pregnancy? No  Is the patient on methotrexate? No   Current Complaints / other details:

## 2018-08-10 ENCOUNTER — Ambulatory Visit: Payer: Managed Care, Other (non HMO)

## 2018-08-10 ENCOUNTER — Other Ambulatory Visit: Payer: Managed Care, Other (non HMO)

## 2018-08-10 ENCOUNTER — Ambulatory Visit: Payer: Managed Care, Other (non HMO) | Admitting: Hematology and Oncology

## 2018-08-11 ENCOUNTER — Ambulatory Visit
Admission: RE | Admit: 2018-08-11 | Discharge: 2018-08-11 | Disposition: A | Discharge status: Home / Self Care | Payer: Managed Care, Other (non HMO) | Source: Ambulatory Visit | Attending: Radiation Oncology | Admitting: Radiation Oncology

## 2018-08-11 ENCOUNTER — Other Ambulatory Visit: Payer: Self-pay

## 2018-08-11 ENCOUNTER — Encounter: Payer: Self-pay | Admitting: Radiation Oncology

## 2018-08-11 DIAGNOSIS — Z171 Estrogen receptor negative status [ER-]: Secondary | ICD-10-CM

## 2018-08-11 DIAGNOSIS — C50412 Malignant neoplasm of upper-outer quadrant of left female breast: Secondary | ICD-10-CM

## 2018-08-11 NOTE — Progress Notes (Signed)
Radiation Oncology         [phone] ________________________________  Name: Christy Serrano MRN: [id number]  Date: 08/11/2018  DOB: 1955-09-21  Follow-Up TELEMEDICINE [facility] Visit Note  Outpatient  CC: Velna Hatchet, MD  Nicholas Lose, MD  Diagnosis:      ICD-10-CM   1. Carcinoma of upper-outer quadrant of left breast in female, estrogen receptor negative ([place]) C50.412    Z17.1   Cancer Staging Carcinoma of upper-outer quadrant of left breast in female, estrogen receptor negative ([place]) Staging form: Breast, AJCC 8th Edition - Pathologic: No Stage Recommended (ypT0, pN0) - Signed by Eppie Gibson, MD on 08/11/2018 - Clinical: Stage IIIA (cT3, cN1, cM0, G3, ER-, PR-, HER2+) - Signed by Eppie Gibson, MD on 08/11/2018  Malignant neoplasm of left female breast [facility]) Staging form: Breast, AJCC 8th Edition - Clinical: Stage IIIA (cT3, cN1, cM0, G3, ER-, PR-, HER2+) - Signed by Nicholas Lose, MD on 02/03/2018  CHIEF COMPLAINT: Here to discuss management of left breast cancer  Narrative:  The patient returns today for follow-up.   She underwent neoadjuvant TCH, Perjeta followed by left  lumpectomy and axillary dissection on 07-12-18 showing no residual disease.    Symptomatically, the patient reports: Her drain was removed yesterday.  She reports neuropathy peripherally since chemotherapy and fingernail changes.  Left underarm is numb since surgery  Some tightness in ROM of left shoulder  I have reviewed her MRI images, CT images, bone scan done since consultation. No obvious distant metastases.         ALLERGIES:  is allergic to oxycodone-acetaminophen; gabapentin; and amoxicillin.  Meds: Current Outpatient Medications  Medication Sig Dispense Refill  . acetaminophen (TYLENOL) 500 MG tablet Take 1,000 mg by mouth every 6 (six) hours as needed for mild pain.    Marland Kitchen albuterol (PROVENTIL HFA;VENTOLIN HFA) 108 (90 Base) MCG/ACT inhaler Inhale into the lungs every 6 (six) hours as  needed for wheezing or shortness of breath.    . budesonide-formoterol (SYMBICORT) 160-4.5 MCG/ACT inhaler Inhale 2 puffs into the lungs 2 (two) times daily.    . bumetanide (BUMEX) 1 MG tablet Take 1 tablet (1 mg total) by mouth 2 (two) times daily. (Patient taking differently: Take 1 mg by mouth 2 (two) times daily. Patient to take 26m in AM, and 125min PM.) 60 tablet 0  . Calcium-Phosphorus-Vitamin D (CITRACAL +D3 PO) Take 1,200 mcg by mouth.    . cholecalciferol (VITAMIN D) 1000 units tablet Take 1,000 Units by mouth daily.    . Marland Kitchenstradiol (VIVELLE-DOT) 0.05 MG/24HR patch     . HYDROcodone-acetaminophen (NORCO/VICODIN) 5-325 MG tablet Take 1-2 tablets by mouth every 6 (six) hours as needed for moderate pain or severe pain. 15 tablet 0  . lidocaine-prilocaine (EMLA) cream Apply to affected area once 30 g 3  . Multiple Vitamin (MULTIVITAMIN) capsule Take 1 capsule by mouth daily.    . pantoprazole (PROTONIX) 40 MG tablet Take 1 tablet (40 mg total) by mouth daily. 90 tablet 0  . potassium chloride SA (K-DUR) 20 MEQ tablet Take 1 tablet (20 mEq total) by mouth daily. 90 tablet 1  . benzonatate (TESSALON) 200 MG capsule Take 1 capsule (200 mg total) by mouth 3 (three) times daily. (Patient not taking: Reported on 08/11/2018) 21 capsule 2  . dexamethasone (DECADRON) 4 MG tablet Take 1 tablet (4 mg total) by mouth daily. Take 1 tablet day before chemo and 1 tablet day after with food (Patient not taking: Reported on 08/11/2018) 12 tablet  0  . diphenoxylate-atropine (LOMOTIL) 2.5-0.025 MG tablet Take 1 tablet by mouth 4 (four) times daily as needed for diarrhea or loose stools. (Patient not taking: Reported on 08/11/2018) 30 tablet 3  . LORazepam (ATIVAN) 0.5 MG tablet Take 1 tablet (0.5 mg total) by mouth at bedtime as needed (Nausea or vomiting). (Patient not taking: Reported on 08/11/2018) 30 tablet 0  . ondansetron (ZOFRAN) 8 MG tablet Take 1 tablet (8 mg total) by mouth 2 (two) times daily as needed  (Nausea or vomiting). Begin 4 days after chemotherapy. 30 tablet 1  . prochlorperazine (COMPAZINE) 10 MG tablet Take 1 tablet (10 mg total) by mouth every 6 (six) hours as needed (Nausea or vomiting). 30 tablet 1  . traMADol (ULTRAM) 50 MG tablet Take 1-2 tablets (50-100 mg total) by mouth every 6 (six) hours as needed. (Patient not taking: Reported on 08/11/2018) 20 tablet 1   No current facility-administered medications for this encounter.     Physical Findings:  vitals were not taken for this visit. .     General: Alert and oriented, in no acute distress  MSK: able to raise left arm over head   Lab Findings:  Lab Results  Component Value Date   WBC 4.5 06/17/2018   HGB 8.5 (L) 06/17/2018   HCT 26.9 (L) 06/17/2018   MCV 100.4 (H) 06/17/2018   PLT 195 06/17/2018    '@LASTCHEMISTRY'$ @  Radiographic Findings: Mm Breast Surgical Specimen  Result Date: 07/12/2018 CLINICAL DATA:  Specimen radiograph status post left breast lumpectomy. EXAM: SPECIMEN RADIOGRAPH OF THE LEFT BREAST COMPARISON:  Previous exam(s). FINDINGS: Status post excision of the left breast. The 3 radioactive seeds and 5 biopsy marker clips are present, completely intact, and present within 1 specimen. This was communicated to the OR at 10:14 a.m. IMPRESSION: Specimen radiograph of the left breast. Electronically Signed   By: Ammie Ferrier M.D.   On: 07/12/2018 10:23    Impression/Plan: We discussed adjuvant radiotherapy today.  I recommend radiotherapy to the left breast and regional nodes in order to reduce risk of locoregional recurrence by 2/3.  The risks, benefits and side effects of this treatment were discussed in detail.  She understands that radiotherapy is associated with skin irritation and fatigue in the acute setting. Late effects can include cosmetic changes and rare injury to internal organs.   She is enthusiastic about proceeding with treatment.   A total of 5 medically necessary complex treatment  devices will be fabricated and supervised by me: 4 fields with MLCs for custom blocks to protect heart, and lungs;  and, a Vac-lok. MORE COMPLEX DEVICES MAY BE MADE IN DOSIMETRY FOR FIELD IN FIELD BEAMS FOR DOSE HOMOGENEITY.  I have requested : 3D Simulation which is medically necessary to give adequate dose to at risk tissues while sparing lungs and heart.  I have requested a DVH of the following structures: lungs, heart,  lumpectomy cavity.    The patient will receive 50 Gy in 25 fractions to the LEFT breast and regional nodes with 4 fields.  This may be followed by a boost.  She states a PT referral was made by Dr Marlou Starks already. She has some mild ROM issues in left shoulder but is still ready for RT positioning.  This encounter was provided by telemedicine platform Webex due to the pandemic risks.  The patient has given verbal consent for this type of encounter and has been advised to only accept a meeting of this type in a secure  network environment. The time spent during this encounter was 18 minutes. The attendants for this meeting include Eppie Gibson  and Mackie Pai.  During the encounter, Eppie Gibson was located at [facility] Radiation Oncology Department.  Mackie Pai was located at home.   _____________________________________   Eppie Gibson, MD

## 2018-08-13 NOTE — Assessment & Plan Note (Addendum)
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, grade 3, ER 0%, PR 0%, Ki-67 30%, HER-2 +3+ by IHC; hypoechoic mass 2 o'clock position 1.2 x 1.3 x 1.7 cm biopsy IDC, LV I present, grade 2-3, ER 0%, PR 0%, Ki-67 40%, HER-2 +3+, left axillary lymph node positive (3 nodes noted by [place]) T3 N1 stage IIIA  Treatment plan 1.Neoadjuvant chemotherapy with [place] completed 3/26/2020followed by Herceptin Perjeta maintenance versus Kadcyla maintenance  2.lumpectomywith axillary lymphnode dissection: 07/12/2018 3.Adjuvant radiation therapy --------------------------------------------------------------------------------------------------------------------------------------------------- Breast MRI 06/18/2018:No suspicious residual enhancement identified in the left breast, compatible with treatment response, interval decrease in size of left axillary lymph nodes 07/12/2018:Lumpectomy (Dr. Marlou Starks): complete treatment response, no residual carcinoma present in breast or 11/11 lymph nodes.   Treatment plan:  1. Adjuvant radiation therapy: Slightly delayed because of COVID-19.  It will be starting soon. 2. Herceptin Perjeta maintenance  Chemo-induced peripheral neuropathy: Could not tolerate gabapentin it was discontinued. Lower extremity edema: on Bumex.  Patient will come back every 3 weeks for Herceptin Perjeta and every 6 weeks for follow-up with me.

## 2018-08-17 ENCOUNTER — Encounter: Payer: Self-pay | Admitting: Hematology and Oncology

## 2018-08-17 ENCOUNTER — Other Ambulatory Visit: Payer: Self-pay | Admitting: Hematology and Oncology

## 2018-08-17 ENCOUNTER — Other Ambulatory Visit: Payer: Self-pay | Admitting: *Deleted

## 2018-08-17 MED ORDER — BUMETANIDE 1 MG PO TABS: 1.0000 mg | ORAL_TABLET | Freq: Two times a day (BID) | ORAL | 0 refills | Status: DC

## 2018-08-18 ENCOUNTER — Ambulatory Visit
Admission: RE | Admit: 2018-08-18 | Discharge: 2018-08-18 | Disposition: A | Discharge status: Home / Self Care | Payer: Managed Care, Other (non HMO) | Source: Ambulatory Visit | Attending: Radiation Oncology | Admitting: Radiation Oncology

## 2018-08-18 ENCOUNTER — Other Ambulatory Visit: Payer: Self-pay

## 2018-08-18 DIAGNOSIS — Z171 Estrogen receptor negative status [ER-]: Secondary | ICD-10-CM | POA: Insufficient documentation

## 2018-08-18 DIAGNOSIS — C50412 Malignant neoplasm of upper-outer quadrant of left female breast: Secondary | ICD-10-CM | POA: Insufficient documentation

## 2018-08-18 DIAGNOSIS — Z510 Encounter for antineoplastic radiation therapy: Secondary | ICD-10-CM | POA: Insufficient documentation

## 2018-08-18 NOTE — Progress Notes (Signed)
  Radiation Oncology         [phone] ________________________________  Name: Christy Serrano MRN: [id number]  Date: 08/18/2018  DOB: 1955-05-16  SIMULATION AND TREATMENT PLANNING NOTE    Outpatient  DIAGNOSIS:     ICD-10-CM   1. Carcinoma of upper-outer quadrant of left breast in female, estrogen receptor negative ([place]) C50.412    Z17.1     NARRATIVE:  The patient was brought to the [facility].  Identity was confirmed.  All relevant records and images related to the planned course of therapy were reviewed.  The patient freely provided informed written consent to proceed with treatment after reviewing the details related to the planned course of therapy. The consent form was witnessed and verified by the simulation staff.    Then, the patient was set-up in a stable reproducible supine position for radiation therapy with her ipsilateral arm over her head, and her upper body secured in a custom-made Vac-lok device.  CT images were obtained.  Surface markings were placed.  The CT images were loaded into the planning software.    TREATMENT PLANNING NOTE: Treatment planning then occurred.  The radiation prescription was entered and confirmed.     A total of 5 medically necessary complex treatment devices were fabricated and supervised by me: 4 fields with MLCs for custom blocks to protect heart, and lungs;  and, a Vac-lok. MORE COMPLEX DEVICES MAY BE MADE IN DOSIMETRY FOR FIELD IN FIELD BEAMS FOR DOSE HOMOGENEITY.  I have requested : 3D Simulation which is medically necessary to give adequate dose to at risk tissues while sparing lungs and heart.  I have requested a DVH of the following structures: lungs, heart, left lumpectomy cavity.    The patient will receive 50 Gy in 25 fractions to the left breast and regional nodes with 4 fields.  This will be followed by a boost.  Optical Surface Tracking Plan:  Since intensity modulated radiotherapy (IMRT) and 3D conformal  radiation treatment methods are predicated on accurate and precise positioning for treatment, intrafraction motion monitoring is medically necessary to ensure accurate and safe treatment delivery. The ability to quantify intrafraction motion without excessive ionizing radiation dose can only be performed with optical surface tracking. Accordingly, surface imaging offers the opportunity to obtain 3D measurements of patient position throughout IMRT and 3D treatments without excessive radiation exposure. I am ordering optical surface tracking for this patient's upcoming course of radiotherapy.  ________________________________   Reference:  Ursula Alert, J, et al. Surface imaging-based analysis of intrafraction motion for breast radiotherapy patients.Journal of [place], n. 6, nov. 2014. ISSN [id number].  Available at: <http://www.jacmp.org/index.php/jacmp/article/view/4957>.    -----------------------------------  Eppie Gibson, MD

## 2018-08-19 NOTE — Progress Notes (Signed)
Patient Care Team: Velna Hatchet, MD as PCP - General (Internal Medicine) Jovita Kussmaul, MD as Consulting Physician (General Surgery) Nicholas Lose, MD as Consulting Physician (Hematology and Oncology) Eppie Gibson, MD as Attending Physician (Radiation Oncology)  DIAGNOSIS:    ICD-10-CM   1. Malignant neoplasm of upper-outer quadrant of left breast in female, estrogen receptor negative ([place]) C50.412    Z17.1     SUMMARY OF ONCOLOGIC HISTORY:   Malignant neoplasm of left female breast ([place])   01/18/2018 Initial Diagnosis    Screening mammogram detected microcalcifications and asymmetry, left breast segmental microcalcifications UOQ 1.5 x 4.3 x 5.4 cm biopsy-proven IDC with DCIS with LV I, grade 3, ER 0%, PR 0%, Ki-67 30%, HER-2 +3+ by IHC; hypoechoic mass 2 o'clock position 1.2 x 1.3 x 1.7 cm biopsy IDC, LV I present, grade 2-3, ER 0%, PR 0%, Ki-67 40%, HER-2 +3+, left axillary lymph node positive (3 nodes noted by [place]) T3 N1 stage IIIA    02/03/2018 Cancer Staging    Staging form: Breast, AJCC 8th Edition - Clinical: Stage IIIA (cT3, cN1, cM0, G3, ER-, PR-, HER2+) - Signed by Nicholas Lose, MD on 02/03/2018    02/20/2018 Genetic Testing    No pathogenic variants identified on the [facility] Expanded + RNA insight panel. Genes Analyzed (67 total): AIP, ALK, APC*, ATM*, BAP1, BARD1, BLM, BMPR1A, BRCA1*, BRCA2*, BRIP1*, CDH1*, CDK4, CDKN1B, CDKN2A, CHEK2*, DICER1, FANCC, FH, FLCN, GALNT12, HOXB13, MAX, MEN1, MET, MLH1*, MRE11A, MSH2*, MSH6*, MUTYH*, NBN, NF1*, NF2, PALB2*, PHOX2B, PMS2*, POLD1, POLE, POT1, PRKAR1A, PTCH1, PTEN*, RAD50, RAD51C*, RAD51D*, RB1, RET, SDHA, SDHAF2, SDHB, SDHC, SDHD, SMAD4, SMARCA4, SMARCB1, SMARCE1, STK11, SUFU, TMEM127, TP53*, TSC1, TSC2, VHL and XRCC2 (sequencing and deletion/duplication); MITF (sequencing only); EPCAM and GREM1 (deletion/duplication only). DNA and RNA analyses performed for * genes. The report date is 02/20/2018.    03/04/2018 -   Neo-Adjuvant Chemotherapy    Neoadjuvant chemotherapy with [facility] Perjeta    03/08/2018 - 03/10/2018 Hospital Admission    Admitted for recurrent syncope with loss of consciousness for up to 5 minutes with loss of bladder, no abnormalities identified.  MRI brain, carotid ultrasound, EEG studies were negative.    06/17/2018 Breast MRI    No suspicious residual enhancement identified in the left breast, compatible with treatment response, interval decrease in size of left axillary lymph nodes    07/12/2018 Surgery    Lumpectomy (Dr. Marlou Starks): complete treatment response, no residual carcinoma present in breast or 11/11 lymph nodes.      Carcinoma of upper-outer quadrant of left breast in female, estrogen receptor negative ([place])   07/12/2018 Initial Diagnosis    Carcinoma of upper-outer quadrant of left breast in female, estrogen receptor negative ([place])    08/11/2018 Cancer Staging    Staging form: Breast, AJCC 8th Edition - Pathologic: No Stage Recommended (ypT0, pN0) - Signed by Eppie Gibson, MD on 08/11/2018    08/11/2018 Cancer Staging    Staging form: Breast, AJCC 8th Edition - Clinical: Stage IIIA (cT3, cN1, cM0, G3, ER-, PR-, HER2+) - Signed by Eppie Gibson, MD on 08/11/2018     CHIEF COMPLIANT: Follow-up prior to beginning radiation  INTERVAL HISTORY: Christy Serrano is a 63 y.o. with above-mentioned history of left breast cancer treated with neoadjuvant chemotherapy and lumpectomy. She presents to the clinic today for follow-up prior to radiation.  Complains of significant pain in the tips of the finger for which she is using Vicodin.  She has run out of  it and is requesting a refill.  She could not tolerate gabapentin 300 mg dosage.  The swelling of her legs has improved remarkably.  Her taste is recovering.  Hair is also starting to come back.  REVIEW OF SYSTEMS:   Constitutional: Denies fevers, chills or abnormal weight loss Eyes: Denies blurriness of vision Ears, nose, mouth,  throat, and face: Denies mucositis or sore throat Respiratory: Denies cough, dyspnea or wheezes Cardiovascular: Denies palpitation, chest discomfort Gastrointestinal: Denies nausea, heartburn or change in bowel habits Skin: Denies abnormal skin rashes Lymphatics: Denies new lymphadenopathy or easy bruising Neurological: Severe pain and tingling and numbness in the fingers. Behavioral/Psych: Mood is stable, no new changes  Extremities: Bilateral lower extremity edema much improved Breast: denies any pain or lumps or nodules in either breasts All other systems were reviewed with the patient and are negative.  I have reviewed the past medical history, past surgical history, social history and family history with the patient and they are unchanged from previous note.  ALLERGIES:  is allergic to oxycodone-acetaminophen; gabapentin; and amoxicillin.  MEDICATIONS:  Current Outpatient Medications  Medication Sig Dispense Refill  . acetaminophen (TYLENOL) 500 MG tablet Take 1,000 mg by mouth every 6 (six) hours as needed for mild pain.    Marland Kitchen albuterol (PROVENTIL HFA;VENTOLIN HFA) 108 (90 Base) MCG/ACT inhaler Inhale into the lungs every 6 (six) hours as needed for wheezing or shortness of breath.    . benzonatate (TESSALON) 200 MG capsule Take 1 capsule (200 mg total) by mouth 3 (three) times daily. (Patient not taking: Reported on 08/11/2018) 21 capsule 2  . budesonide-formoterol (SYMBICORT) 160-4.5 MCG/ACT inhaler Inhale 2 puffs into the lungs 2 (two) times daily.    . bumetanide (BUMEX) 1 MG tablet Take 1 tablet (1 mg total) by mouth 2 (two) times daily. 60 tablet 0  . Calcium-Phosphorus-Vitamin D (CITRACAL +D3 PO) Take 1,200 mcg by mouth.    . cholecalciferol (VITAMIN D) 1000 units tablet Take 1,000 Units by mouth daily.    Marland Kitchen estradiol (VIVELLE-DOT) 0.05 MG/24HR patch     . gabapentin (NEURONTIN) 100 MG capsule Take 1 capsule (100 mg total) by mouth daily. 90 capsule 3  .  HYDROcodone-acetaminophen (NORCO/VICODIN) 5-325 MG tablet Take 1 tablet by mouth every 6 (six) hours as needed for moderate pain or severe pain. 30 tablet 0  . lidocaine-prilocaine (EMLA) cream Apply to affected area once 30 g 3  . Multiple Vitamin (MULTIVITAMIN) capsule Take 1 capsule by mouth daily.    . potassium chloride SA (K-DUR) 20 MEQ tablet Take 1 tablet (20 mEq total) by mouth daily. 90 tablet 1   No current facility-administered medications for this visit.     PHYSICAL EXAMINATION: ECOG PERFORMANCE STATUS: 1 - Symptomatic but completely ambulatory  Vitals:   08/20/18 0852  BP: (!) 129/56  Pulse: 72  Resp: 17  Temp: 97.8 F (36.6 C)  SpO2: 100%   Filed Weights   08/20/18 0852  Weight: 170 lb (77.1 kg)    GENERAL: alert, no distress and comfortable SKIN: skin color, texture, turgor are normal, no rashes or significant lesions EYES: normal, Conjunctiva are pink and non-injected, sclera clear OROPHARYNX: no exudate, no erythema and lips, buccal mucosa, and tongue normal  NECK: supple, thyroid normal size, non-tender, without nodularity LYMPH: no palpable lymphadenopathy in the cervical, axillary or inguinal LUNGS: clear to auscultation and percussion with normal breathing effort HEART: regular rate & rhythm and no murmurs and no lower extremity edema ABDOMEN: abdomen soft,  non-tender and normal bowel sounds MUSCULOSKELETAL: no cyanosis of digits and no clubbing  NEURO: alert & oriented x 3 with fluent speech, peripheral neuropathy with pain of the tips of the fingers along with numbness EXTREMITIES: 1+ lower extremity edema  LABORATORY DATA:  I have reviewed the data as listed CMP Latest Ref Rng & Units 06/17/2018 05/27/2018 05/07/2018  Glucose 70 - 99 mg/dL 92 117(H) 115(H)  BUN 6 - 20 mg/dL _0 Creatinine 0.44 - 1.00 mg/dL 0.82 0.83 0.88  Sodium 135 - 145 mmol/L 139 137 138  Potassium 3.5 - 5.1 mmol/L 3.9 4.3 4.5  Chloride 98 - 111 mmol/L 105 107 106  CO2 22  - 32 mmol/L _1 Calcium 8.9 - 10.3 mg/dL 7.9(L) 9.0 9.2  Total Protein 6.5 - 8.1 g/dL 6.2(L) 6.6 6.8  Total Bilirubin 0.3 - 1.2 mg/dL 0.6 0.3 0.6  Alkaline Phos 38 - 126 U/L 71 70 94  AST 15 - 41 U/L 19 49(H) 53(H)  ALT 0 - 44 U/L 11 32 37    Lab Results  Component Value Date   WBC 3.9 (L) 08/20/2018   HGB 9.4 (L) 08/20/2018   HCT 30.1 (L) 08/20/2018   MCV 98.7 08/20/2018   PLT 154 08/20/2018   NEUTROABS 1.9 08/20/2018    ASSESSMENT & PLAN:  Malignant neoplasm of left female breast ([place]) 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, grade 3, ER 0%, PR 0%, Ki-67 30%, HER-2 +3+ by IHC; hypoechoic mass 2 o'clock position 1.2 x 1.3 x 1.7 cm biopsy IDC, LV I present, grade 2-3, ER 0%, PR 0%, Ki-67 40%, HER-2 +3+, left axillary lymph node positive (3 nodes noted by [place]) T3 N1 stage IIIA  Treatment plan 1.Neoadjuvant chemotherapy with [place] completed 3/26/2020followed by Herceptin Perjeta maintenance versus Kadcyla maintenance  2.lumpectomywith axillary lymphnode dissection: 07/12/2018 3.Adjuvant radiation therapy --------------------------------------------------------------------------------------------------------------------------------------------------- Breast MRI 06/18/2018:No suspicious residual enhancement identified in the left breast, compatible with treatment response, interval decrease in size of left axillary lymph nodes 07/12/2018:Lumpectomy (Dr. Marlou Starks): complete treatment response, no residual carcinoma present in breast or 11/11 lymph nodes.   Treatment plan:  1. Adjuvant radiation therapy: Slightly delayed because of COVID-19.  It will be starting soon. 2. Herceptin Perjeta maintenance  Chemo-induced peripheral neuropathy: I gave a new prescription 400 mg of gabapentin.  I also renewed her Vicodin.  Her goal is to taper  off the Vicodin and titrate the gabapentin so that she can come off narcotics within 2 months.  Lower extremity edema: on Bumex.  Much improved Patient may be able to return to work 10/25/2018.  Patient will come back every 3 weeks for Herceptin Perjeta and every 6 weeks for follow-up with me.    No orders of the defined types were placed in this encounter.  The patient has a good understanding of the overall plan. she agrees with it. she will call with any problems that may develop before the next visit here.  Nicholas Lose, MD 08/20/2018  Julious Oka Dorshimer am acting as scribe for Dr. Nicholas Lose.  I have reviewed the above documentation for accuracy and completeness, and I agree with the above.

## 2018-08-20 ENCOUNTER — Inpatient Hospital Stay: Payer: Managed Care, Other (non HMO) | Attending: Hematology and Oncology | Admitting: Hematology and Oncology

## 2018-08-20 ENCOUNTER — Inpatient Hospital Stay: Payer: Managed Care, Other (non HMO)

## 2018-08-20 ENCOUNTER — Other Ambulatory Visit: Payer: Self-pay | Admitting: Hematology and Oncology

## 2018-08-20 ENCOUNTER — Other Ambulatory Visit: Payer: Self-pay

## 2018-08-20 ENCOUNTER — Encounter: Payer: Self-pay | Admitting: *Deleted

## 2018-08-20 DIAGNOSIS — C773 Secondary and unspecified malignant neoplasm of axilla and upper limb lymph nodes: Secondary | ICD-10-CM | POA: Diagnosis not present

## 2018-08-20 DIAGNOSIS — C50412 Malignant neoplasm of upper-outer quadrant of left female breast: Secondary | ICD-10-CM | POA: Diagnosis not present

## 2018-08-20 DIAGNOSIS — Z79899 Other long term (current) drug therapy: Secondary | ICD-10-CM | POA: Diagnosis not present

## 2018-08-20 DIAGNOSIS — M7989 Other specified soft tissue disorders: Secondary | ICD-10-CM | POA: Insufficient documentation

## 2018-08-20 DIAGNOSIS — Z5111 Encounter for antineoplastic chemotherapy: Secondary | ICD-10-CM | POA: Insufficient documentation

## 2018-08-20 DIAGNOSIS — Z171 Estrogen receptor negative status [ER-]: Secondary | ICD-10-CM | POA: Diagnosis not present

## 2018-08-20 DIAGNOSIS — Z95828 Presence of other vascular implants and grafts: Secondary | ICD-10-CM

## 2018-08-20 LAB — CBC WITH DIFFERENTIAL/PLATELET
Abs Immature Granulocytes: 0 10*3/uL (ref 0.00–0.07)
Basophils Absolute: 0 10*3/uL (ref 0.0–0.1)
Basophils Relative: 0 %
Eosinophils Absolute: 0.1 10*3/uL (ref 0.0–0.5)
Eosinophils Relative: 2 %
HCT: 30.1 % — ABNORMAL LOW (ref 36.0–46.0)
Hemoglobin: 9.4 g/dL — ABNORMAL LOW (ref 12.0–15.0)
Immature Granulocytes: 0 %
Lymphocytes Relative: 42 %
Lymphs Abs: 1.6 10*3/uL (ref 0.7–4.0)
MCH: 30.8 pg (ref 26.0–34.0)
MCHC: 31.2 g/dL (ref 30.0–36.0)
MCV: 98.7 fL (ref 80.0–100.0)
Monocytes Absolute: 0.3 10*3/uL (ref 0.1–1.0)
Monocytes Relative: 7 %
Neutro Abs: 1.9 10*3/uL (ref 1.7–7.7)
Neutrophils Relative %: 49 %
Platelets: 154 10*3/uL (ref 150–400)
RBC: 3.05 MIL/uL — ABNORMAL LOW (ref 3.87–5.11)
RDW: 15.1 % (ref 11.5–15.5)
WBC: 3.9 10*3/uL — ABNORMAL LOW (ref 4.0–10.5)
nRBC: 0 % (ref 0.0–0.2)

## 2018-08-20 LAB — CMP (CANCER CENTER ONLY)
ALT: 12 U/L (ref 0–44)
AST: 20 U/L (ref 15–41)
Albumin: 4 g/dL (ref 3.5–5.0)
Alkaline Phosphatase: 103 U/L (ref 38–126)
Anion gap: 11 (ref 5–15)
BUN: 27 mg/dL — ABNORMAL HIGH (ref 8–23)
CO2: 28 mmol/L (ref 22–32)
Calcium: 9 mg/dL (ref 8.9–10.3)
Chloride: 101 mmol/L (ref 98–111)
Creatinine: 1.27 mg/dL — ABNORMAL HIGH (ref 0.44–1.00)
GFR, Est AFR Am: 53 mL/min — ABNORMAL LOW (ref >60)
GFR, Estimated: 46 mL/min — ABNORMAL LOW (ref >60)
Glucose, Bld: 83 mg/dL (ref 70–99)
Potassium: 3.6 mmol/L (ref 3.5–5.1)
Sodium: 140 mmol/L (ref 135–145)
Total Bilirubin: 0.6 mg/dL (ref 0.3–1.2)
Total Protein: 7.6 g/dL (ref 6.5–8.1)

## 2018-08-20 MED ORDER — TRASTUZUMAB CHEMO 150 MG IV SOLR
450.0000 mg | Freq: Once | INTRAVENOUS | Status: AC
  Administered 2018-08-20: 11:00:00 450 mg via INTRAVENOUS
  Filled 2018-08-20: qty 21.43

## 2018-08-20 MED ORDER — SODIUM CHLORIDE 0.9 % IV SOLN
420.0000 mg | Freq: Once | INTRAVENOUS | Status: AC
  Administered 2018-08-20: 11:00:00 420 mg via INTRAVENOUS
  Filled 2018-08-20: qty 14

## 2018-08-20 MED ORDER — HEPARIN SOD (PORK) LOCK FLUSH 100 UNIT/ML IV SOLN
500.0000 [IU] | Freq: Once | INTRAVENOUS | Status: AC | PRN
  Administered 2018-08-20: 13:00:00 500 [IU]
  Filled 2018-08-20: qty 5

## 2018-08-20 MED ORDER — HYDROCODONE-ACETAMINOPHEN 5-325 MG PO TABS: 1.0000 | ORAL_TABLET | Freq: Four times a day (QID) | ORAL | 0 refills | Status: DC | PRN

## 2018-08-20 MED ORDER — SODIUM CHLORIDE 0.9% FLUSH
10.0000 mL | INTRAVENOUS | Status: DC | PRN
  Administered 2018-08-20: 10 mL
  Filled 2018-08-20: qty 10

## 2018-08-20 MED ORDER — SODIUM CHLORIDE 0.9 % IV SOLN
Freq: Once | INTRAVENOUS | Status: AC
  Administered 2018-08-20: 10:00:00 via INTRAVENOUS
  Filled 2018-08-20: qty 250

## 2018-08-20 MED ORDER — DIPHENHYDRAMINE HCL 25 MG PO CAPS
50.0000 mg | ORAL_CAPSULE | Freq: Once | ORAL | Status: AC
  Administered 2018-08-20: 10:00:00 50 mg via ORAL

## 2018-08-20 MED ORDER — GABAPENTIN 100 MG PO CAPS: 100.0000 mg | ORAL_CAPSULE | Freq: Every day | ORAL | 3 refills | Status: DC

## 2018-08-20 MED ORDER — ACETAMINOPHEN 325 MG PO TABS
650.0000 mg | ORAL_TABLET | Freq: Once | ORAL | Status: AC
  Administered 2018-08-20: 10:00:00 650 mg via ORAL

## 2018-08-20 MED ORDER — ACETAMINOPHEN 325 MG PO TABS
ORAL_TABLET | ORAL | Status: AC
  Filled 2018-08-20: qty 2

## 2018-08-20 MED ORDER — DIPHENHYDRAMINE HCL 25 MG PO CAPS
ORAL_CAPSULE | ORAL | Status: AC
  Filled 2018-08-20: qty 2

## 2018-08-20 NOTE — Patient Instructions (Signed)
[facility] Discharge Instructions for Patients Receiving Chemotherapy  Today you received the following chemotherapy agents: Herceptin, Perjeta  To help prevent nausea and vomiting after your treatment, we encourage you to take your nausea medication as directed.   If you develop nausea and vomiting that is not controlled by your nausea medication, call the clinic.   BELOW ARE SYMPTOMS THAT SHOULD BE REPORTED IMMEDIATELY:  *FEVER GREATER THAN 100.5 F  *CHILLS WITH OR WITHOUT FEVER  NAUSEA AND VOMITING THAT IS NOT CONTROLLED WITH YOUR NAUSEA MEDICATION  *UNUSUAL SHORTNESS OF BREATH  *UNUSUAL BRUISING OR BLEEDING  TENDERNESS IN MOUTH AND THROAT WITH OR WITHOUT PRESENCE OF ULCERS  *URINARY PROBLEMS  *BOWEL PROBLEMS  UNUSUAL RASH Items with * indicate a potential emergency and should be followed up as soon as possible.  Feel free to call the clinic should you have any questions or concerns. The clinic phone number is [phone].  Please show the CHEMO ALERT CARD at check-in to the Emergency Department and triage nurse.   

## 2018-08-23 ENCOUNTER — Encounter: Payer: Self-pay | Admitting: Hematology and Oncology

## 2018-08-24 ENCOUNTER — Other Ambulatory Visit: Payer: Self-pay

## 2018-08-24 ENCOUNTER — Ambulatory Visit: Payer: Managed Care, Other (non HMO) | Admitting: Physical Therapy

## 2018-08-24 ENCOUNTER — Encounter: Payer: Self-pay | Admitting: Physical Therapy

## 2018-08-24 DIAGNOSIS — C773 Secondary and unspecified malignant neoplasm of axilla and upper limb lymph nodes: Secondary | ICD-10-CM | POA: Diagnosis not present

## 2018-08-24 DIAGNOSIS — Z171 Estrogen receptor negative status [ER-]: Secondary | ICD-10-CM | POA: Insufficient documentation

## 2018-08-24 DIAGNOSIS — I890 Lymphedema, not elsewhere classified: Secondary | ICD-10-CM

## 2018-08-24 DIAGNOSIS — C50412 Malignant neoplasm of upper-outer quadrant of left female breast: Secondary | ICD-10-CM | POA: Insufficient documentation

## 2018-08-24 DIAGNOSIS — R293 Abnormal posture: Secondary | ICD-10-CM

## 2018-08-24 DIAGNOSIS — M25612 Stiffness of left shoulder, not elsewhere classified: Secondary | ICD-10-CM

## 2018-08-24 DIAGNOSIS — M25512 Pain in left shoulder: Secondary | ICD-10-CM

## 2018-08-24 DIAGNOSIS — M6281 Muscle weakness (generalized): Secondary | ICD-10-CM

## 2018-08-24 NOTE — Therapy (Signed)
[place], [place], [address] Phone: [phone]   Fax:  [phone]  Physical Therapy Evaluation  Patient Details  Name: Christy Serrano MRN: [id number] Date of Birth: 1956/02/03 Referring Provider (PT): Joaquin Courts Date: 08/24/2018  PT End of Session - 08/24/18 1447    Visit Number  1    Number of Visits  9    Date for PT Re-Evaluation  09/21/18    PT Start Time  2924    PT Stop Time  1445    PT Time Calculation (min)  40 min    Activity Tolerance  Patient tolerated treatment well    Behavior During Therapy  [facility] for tasks assessed/performed       Past Medical History:  Diagnosis Date  . Cancer ([place])    left breast  . Chronic bronchitis ([place])   . Family history of breast cancer   . Family history of ovarian cancer   . Family history of prostate cancer   . GERD (gastroesophageal reflux disease)    due to chemotherapy  . Lactose intolerance   . PONV (postoperative nausea and vomiting)     Past Surgical History:  Procedure Laterality Date  . BREAST LUMPECTOMY WITH RADIOACTIVE SEED AND AXILLARY LYMPH NODE DISSECTION Left 07/12/2018   Procedure: LEFT BREAST RADIOACTIVE SEED X 3 LUMPECTOMY X 3  AND AXILLARY LYMPH NODE DISSECTION;  Surgeon: Jovita Kussmaul, MD;  Location: [place];  Service: General;  Laterality: Left;  . PORTACATH PLACEMENT Right 03/03/2018   Procedure: INSERTION PORT-A-CATH;  Surgeon: Jovita Kussmaul, MD;  Location: [place];  Service: General;  Laterality: Right;  . ROTATOR CUFF REPAIR Right 2006  . TOTAL ABDOMINAL HYSTERECTOMY  2010    There were no vitals filed for this visit.   Subjective Assessment - 08/24/18 1409    Subjective  It feels like a metal bracket is in my arm. I have a hard time lifting it out to the side and up. I have the band that sticks up and makes it hard to straighten my arm.    Pertinent History  L breast cancer,  left lumpectomy 07/12/18 ALND (11 nodes removed all negative), pt has completed chemo and begins radiation next week    Patient Stated Goals  to get ROM back in my arm and shoulder, decrease swelling in my side    Currently in Pain?  No/denies         [facility] PT Assessment - 08/24/18 0001      Assessment   Medical Diagnosis  left breast cancer    Referring Provider (PT)  Marlou Starks    Onset Date/Surgical Date  07/12/18    Hand Dominance  Right    Prior Therapy  none      Precautions   Precautions  Other (comment)    Precaution Comments  at risk for lymphedema      Restrictions   Weight Bearing Restrictions  No      Balance Screen   Has the patient fallen in the past 6 months  Yes    How many times?  2   fainted after chemotherapy   Has the patient had a decrease in activity level because of a fear of falling?   No    Is the patient reluctant to leave their home because of a fear of falling?   No      Home Environment   Living Environment  Private residence    Living Arrangements  Other (Comment)   sister   Available Help at Discharge  Family    Type of [place]      Prior Function   Level of [place]  On disability    Leisure  pt does not exercise, she walks some      Cognition   Overall Cognitive Status  Within Functional Limits for tasks assessed      Observation/Other Assessments   Observations  cords observed during abduction of left shoulder and numerous cords can be palpated,left lateral trunk swelling      Posture/Postural Control   Posture/Postural Control  Postural limitations    Postural Limitations  Rounded Shoulders;Forward head      ROM / Strength   AROM / PROM / Strength  AROM      AROM   AROM Assessment Site  Shoulder    Right/Left Shoulder  Right;Left    Right Shoulder Flexion  155 Degrees    Right Shoulder ABduction  176 Degrees    Right Shoulder Internal Rotation  78 Degrees    Right Shoulder External Rotation  62  Degrees    Left Shoulder Flexion  125 Degrees    Left Shoulder ABduction  80 Degrees    Left Shoulder Internal Rotation  72 Degrees    Left Shoulder External Rotation  87 Degrees        LYMPHEDEMA/ONCOLOGY QUESTIONNAIRE - 08/24/18 1425      Type   Cancer Type  left breast cancer      Surgeries   Lumpectomy Date  07/12/18    Axillary Lymph Node Dissection Date  07/12/18    Number Lymph Nodes Removed  11      Date Lymphedema/Swelling Started   Date  08/10/18      Treatment   Past Chemotherapy Treatment  Yes    Active Radiation Treatment  --   is starting next week   Past Radiation Treatment  No      What other symptoms do you have   Are you Having Heaviness or Tightness  Yes    Are you having Pain  Yes    Are you having pitting edema  No    Is it Hard or Difficult finding clothes that fit  No    Do you have infections  No    Is there Decreased scar mobility  Yes      Lymphedema Assessments   Lymphedema Assessments  Upper extremities      Right Upper Extremity Lymphedema   15 cm Proximal to Olecranon Process  31.5 cm    Olecranon Process  26.5 cm    15 cm Proximal to Ulnar Styloid Process  26 cm    Just Proximal to Ulnar Styloid Process  17.4 cm    Across Hand at [employer]  19.5 cm    At [place] of 2nd Digit  6.8 cm      Left Upper Extremity Lymphedema   15 cm Proximal to Olecranon Process  31 cm    Olecranon Process  26 cm    15 cm Proximal to Ulnar Styloid Process  25 cm    Just Proximal to Ulnar Styloid Process  16.5 cm    Across Hand at [employer]  20.2 cm    At [place] of 2nd Digit  6.5 cm             Objective  measurements completed on examination: See above findings.      [facility] Adult PT Treatment/Exercise - 08/24/18 0001      Manual Therapy   Manual Therapy  Myofascial release;Neural Stretch    Myofascial Release  to cording at left antecubital fossa using cross hand technique, 4 cords released    Neural Stretch  instructed pt in median  nerve stretch on wall to help decrease cording             PT Education - 08/24/18 1447    Education Details  anatomy and physiology of lymphatic system, benefits of compression bra for lateral trunk swelling    Person(s) Educated  Patient    Methods  Explanation    Comprehension  Verbalized understanding          PT Long Term Goals - 08/24/18 1454      PT LONG TERM GOAL #1   Title  Pt will demonstrate 160 degrees of left shoulder flexion to allow her to reach overhead.    Baseline  125    Time  4    Period  Weeks    Status  New    Target Date  09/21/18      PT LONG TERM GOAL #2   Title  Pt will demonstrate 160 degrees of left shoulder abduction to allow her to reach out to the side    Baseline  80    Time  4    Period  Weeks    Status  New    Target Date  09/21/18      PT LONG TERM GOAL #3   Title  Pt will be independent in a home exercise program for continued strengthening and stretching    Time  4    Period  Weeks    Status  New    Target Date  09/21/18      PT LONG TERM GOAL #4   Title  Pt will report a 50% improvement in swelling in left lateral trunk to allow improved comfort.    Time  4    Period  Weeks    Status  New    Target Date  09/21/18      PT LONG TERM GOAL #5   Title  Pt will report a 75% improvement in pain and tightness in left arm due to cording to allow improved function    Time  4    Period  Weeks    Status  New    Target Date  09/21/18             Plan - 08/24/18 1448    Clinical Impression Statement  Pt presents to PT with decreased left shoulder ROM and L lateral trunk swelling folloiwng a lumpectomy and ALND on 07/12/18. She has completed chemotherapy and will begin radiation next week. Pt has considerable cording in LUE from axilla to wrist with at least 6 cords palpable in antecubital fossa. Myofascial performed on this area today and 4 cords released. Pt also has limited left shoulder ROM and increased edema in left  lateral trunk. She would benefit from skilled PT services to increase left shoulder ROM especially to allow her to comfortably attain position needed for radiation, decrease left lateral trunk swelling and decrease cording to allow improved comfort.     Personal Factors and Comorbidities  Other   pt starting radiation   Examination-Activity Limitations  Reach Overhead;Carry    Examination-Participation Restrictions  Cleaning;Meal Prep  Clinical Decision Making  Moderate    Rehab Potential  Good    PT Frequency  2x / week    PT Duration  4 weeks    PT Treatment/Interventions  ADLs/Self Care Home Management;Therapeutic exercise;Patient/family education;Therapeutic activities;Manual techniques;Manual lymph drainage;Scar mobilization;Passive range of motion;Taping;Joint Manipulations    PT Next Visit Plan  begin MLD and instruct in self MLD left lateral trunk, myofascial to cording in left antecubital fossa, see if pt brought in sports bra and if it has enough coverage for lateral trunk swelling, begin PROM left shoulder and give dowel exercises    PT Home Exercise Plan  neural stretch    Consulted and Agree with Plan of Care  Patient       Patient will benefit from skilled therapeutic intervention in order to improve the following deficits and impairments:  Pain, Increased fascial restricitons, Decreased knowledge of use of DME, Decreased scar mobility, Postural dysfunction, Decreased range of motion, Decreased strength, Impaired UE functional use, Increased edema  Visit Diagnosis: Stiffness of left shoulder, not elsewhere classified  Acute pain of left shoulder  Lymphedema, not elsewhere classified  Muscle weakness (generalized)  Abnormal posture     Problem List Patient Active Problem List   Diagnosis Date Noted  . Carcinoma of upper-outer quadrant of left breast in female, estrogen receptor negative ([place]) 07/12/2018  . Seizure-like activity ([place])   . Hyponatremia   . Recurrent  syncope 03/08/2018  . LFT elevation   . Seizure ([place])   . Thrombocytopenia ([place])   . Port-A-Cath in place 03/04/2018  . Genetic testing 02/23/2018  . Family history of breast cancer   . Family history of prostate cancer   . Family history of ovarian cancer   . Malignant neoplasm of left female breast ([place]) 02/03/2018  . Prediabetes 01/06/2018  . Reactive airway disease without complication 02/40/9735  . Class 1 obesity with serious comorbidity and body mass index (BMI) of 32.0 to 32.9 in adult 12/22/2017    Allyson Sabal [facility] 08/24/2018, 2:56 PM  [place], [place], [address] Phone: [phone]   Fax:  [phone]  Name: PHILLIP SANDLER MRN: [id number] Date of Birth: 1956-02-22  Manus Gunning, PT 08/24/18 2:57 PM

## 2018-08-25 ENCOUNTER — Encounter: Payer: Self-pay | Admitting: Pharmacy Technician

## 2018-08-25 ENCOUNTER — Ambulatory Visit: Payer: Managed Care, Other (non HMO) | Admitting: Radiation Oncology

## 2018-08-25 NOTE — Progress Notes (Signed)
This patient's insurance will term on 08/30/18. I will have a drug assistance application for Herceptin/Perjeta for patient to sign during [place] treatment on 09/01/17.

## 2018-08-26 ENCOUNTER — Other Ambulatory Visit: Payer: Self-pay

## 2018-08-26 ENCOUNTER — Ambulatory Visit: Payer: Managed Care, Other (non HMO)

## 2018-08-26 ENCOUNTER — Ambulatory Visit: Payer: Managed Care, Other (non HMO) | Admitting: Physical Therapy

## 2018-08-26 ENCOUNTER — Encounter: Payer: Self-pay | Admitting: Physical Therapy

## 2018-08-26 DIAGNOSIS — C50412 Malignant neoplasm of upper-outer quadrant of left female breast: Secondary | ICD-10-CM | POA: Diagnosis not present

## 2018-08-26 DIAGNOSIS — M25512 Pain in left shoulder: Secondary | ICD-10-CM

## 2018-08-26 DIAGNOSIS — M25612 Stiffness of left shoulder, not elsewhere classified: Secondary | ICD-10-CM

## 2018-08-26 NOTE — Therapy (Signed)
[facility], [place], [address] Phone: [phone]   Fax:  [phone]  Physical Therapy Treatment  Patient Details  Name: Christy Serrano MRN: [id number] Date of Birth: 07-20-1955 Referring Provider (PT): Joaquin Courts Date: 08/26/2018  PT End of Session - 08/26/18 3825    Visit Number  2    Number of Visits  9    Date for PT Re-Evaluation  09/21/18    PT Start Time  1602    PT Stop Time  1647    PT Time Calculation (min)  45 min    Activity Tolerance  Patient tolerated treatment well    Behavior During Therapy  [facility] for tasks assessed/performed       Past Medical History:  Diagnosis Date  . Cancer ([place])    left breast  . Chronic bronchitis ([place])   . Family history of breast cancer   . Family history of ovarian cancer   . Family history of prostate cancer   . GERD (gastroesophageal reflux disease)    due to chemotherapy  . Lactose intolerance   . PONV (postoperative nausea and vomiting)     Past Surgical History:  Procedure Laterality Date  . BREAST LUMPECTOMY WITH RADIOACTIVE SEED AND AXILLARY LYMPH NODE DISSECTION Left 07/12/2018   Procedure: LEFT BREAST RADIOACTIVE SEED X 3 LUMPECTOMY X 3  AND AXILLARY LYMPH NODE DISSECTION;  Surgeon: Jovita Kussmaul, MD;  Location: [place];  Service: General;  Laterality: Left;  . PORTACATH PLACEMENT Right 03/03/2018   Procedure: INSERTION PORT-A-CATH;  Surgeon: Jovita Kussmaul, MD;  Location: [place];  Service: General;  Laterality: Right;  . ROTATOR CUFF REPAIR Right 2006  . TOTAL ABDOMINAL HYSTERECTOMY  2010    There were no vitals filed for this visit.  Subjective Assessment - 08/26/18 1602    Subjective  I can reach a whole lot further than I could because the cording is not preventing me from doing that anymore.     Pertinent History  L breast cancer, left lumpectomy 07/12/18 ALND (11 nodes removed all  negative), pt has completed chemo and begins radiation next week    Patient Stated Goals  to get ROM back in my arm and shoulder, decrease swelling in my side    Currently in Pain?  No/denies                  Outpatient Re[facility] from [facility]24/2018 [facility]  Lymphedema Life Impact Scale Total Score  5.88 %           [facility] Adult PT Treatment/Exercise - 08/26/18 0001      Manual Therapy   Manual Therapy  Passive ROM;Myofascial release    Myofascial Release  to cording from left axilla to wrist using cross hand technique    Passive ROM  to left shoulder in direction of flexion, abduction and ER with prolonged holds while myofascial release performed to cording                  PT Long Term Goals - 08/24/18 1454      PT LONG TERM GOAL #1   Title  Pt will demonstrate 160 degrees of left shoulder flexion to allow her to reach overhead.    Baseline  125    Time  4    Period  Weeks    Status  New    Target Date  09/21/18  PT LONG TERM GOAL #2   Title  Pt will demonstrate 160 degrees of left shoulder abduction to allow her to reach out to the side    Baseline  80    Time  4    Period  Weeks    Status  New    Target Date  09/21/18      PT LONG TERM GOAL #3   Title  Pt will be independent in a home exercise program for continued strengthening and stretching    Time  4    Period  Weeks    Status  New    Target Date  09/21/18      PT LONG TERM GOAL #4   Title  Pt will report a 50% improvement in swelling in left lateral trunk to allow improved comfort.    Time  4    Period  Weeks    Status  New    Target Date  09/21/18      PT LONG TERM GOAL #5   Title  Pt will report a 75% improvement in pain and tightness in left arm due to cording to allow improved function    Time  4    Period  Weeks    Status  New    Target Date  09/21/18            Plan - 08/26/18 1647    Clinical Impression Statement  Continued  with myofascial release today to cording from axilla to antecubital fossa. Only 1 cord release today but the palpable ones were not as taut by end of session. Began PROM to left shoulder which is very tight and limited secondary to cording. Pt stated that after treamtent yesterday she was able to bend down and tie her shoes more easily.     Rehab Potential  Good    PT Frequency  2x / week    PT Duration  4 weeks    PT Treatment/Interventions  ADLs/Self Care Home Management;Therapeutic exercise;Patient/family education;Therapeutic activities;Manual techniques;Manual lymph drainage;Scar mobilization;Passive range of motion;Taping;Joint Manipulations    PT Next Visit Plan  begin MLD and instruct in self MLD left lateral trunk, myofascial to cording in left antecubital fossa, see if pt brought in sports bra and if it has enough coverage for lateral trunk swelling, begin PROM left shoulder and give dowel exercises    PT Home Exercise Plan  neural stretch    Consulted and Agree with Plan of Care  Patient       Patient will benefit from skilled therapeutic intervention in order to improve the following deficits and impairments:  Pain, Increased fascial restricitons, Decreased knowledge of use of DME, Decreased scar mobility, Postural dysfunction, Decreased range of motion, Decreased strength, Impaired UE functional use, Increased edema  Visit Diagnosis: Stiffness of left shoulder, not elsewhere classified  Acute pain of left shoulder     Problem List Patient Active Problem List   Diagnosis Date Noted  . Carcinoma of upper-outer quadrant of left breast in female, estrogen receptor negative ([place]) 07/12/2018  . Seizure-like activity ([place])   . Hyponatremia   . Recurrent syncope 03/08/2018  . LFT elevation   . Seizure ([place])   . Thrombocytopenia ([place])   . Port-A-Cath in place 03/04/2018  . Genetic testing 02/23/2018  . Family history of breast cancer   . Family history of prostate cancer   .  Family history of ovarian cancer   . Malignant neoplasm of left female breast ([place]) 02/03/2018  .  Prediabetes 01/06/2018  . Reactive airway disease without complication 73/40/3709  . Class 1 obesity with serious comorbidity and body mass index (BMI) of 32.0 to 32.9 in adult 12/22/2017    [facility] 08/26/2018, 4:49 PM  [facility] [place], [place], [address] Phone: [phone]   Fax:  [phone]  Name: Christy Serrano MRN: [id number] Date of Birth: 1956-01-09  Manus Gunning, PT 08/26/18 4:49 PM

## 2018-08-27 ENCOUNTER — Ambulatory Visit: Payer: Managed Care, Other (non HMO)

## 2018-08-30 ENCOUNTER — Ambulatory Visit: Payer: Managed Care, Other (non HMO)

## 2018-08-31 ENCOUNTER — Ambulatory Visit: Payer: Managed Care, Other (non HMO)

## 2018-08-31 ENCOUNTER — Other Ambulatory Visit: Payer: Self-pay

## 2018-08-31 ENCOUNTER — Encounter: Payer: Self-pay | Admitting: Physical Therapy

## 2018-08-31 ENCOUNTER — Ambulatory Visit: Payer: Managed Care, Other (non HMO) | Admitting: Physical Therapy

## 2018-08-31 DIAGNOSIS — M25612 Stiffness of left shoulder, not elsewhere classified: Secondary | ICD-10-CM

## 2018-08-31 DIAGNOSIS — I890 Lymphedema, not elsewhere classified: Secondary | ICD-10-CM

## 2018-08-31 DIAGNOSIS — M25512 Pain in left shoulder: Secondary | ICD-10-CM

## 2018-08-31 DIAGNOSIS — Z510 Encounter for antineoplastic radiation therapy: Secondary | ICD-10-CM | POA: Insufficient documentation

## 2018-08-31 DIAGNOSIS — C50412 Malignant neoplasm of upper-outer quadrant of left female breast: Secondary | ICD-10-CM | POA: Insufficient documentation

## 2018-08-31 DIAGNOSIS — Z171 Estrogen receptor negative status [ER-]: Secondary | ICD-10-CM | POA: Insufficient documentation

## 2018-08-31 DIAGNOSIS — M6281 Muscle weakness (generalized): Secondary | ICD-10-CM

## 2018-08-31 DIAGNOSIS — R293 Abnormal posture: Secondary | ICD-10-CM

## 2018-08-31 NOTE — Patient Instructions (Signed)

## 2018-08-31 NOTE — Therapy (Signed)
[facility] [place], [place], [address] Phone: [phone]   Fax:  [phone]  Physical Therapy Treatment  Patient Details  Name: Christy Serrano MRN: [id number] Date of Birth: 24-May-1955 Referring Provider (PT): Joaquin Courts Date: 08/31/2018  PT End of Session - 08/31/18 0086    Visit Number  3    Number of Visits  9    Date for PT Re-Evaluation  09/21/18    PT Start Time  0827    PT Stop Time  0912    PT Time Calculation (min)  45 min    Activity Tolerance  Patient tolerated treatment well    Behavior During Therapy  [facility] for tasks assessed/performed       Past Medical History:  Diagnosis Date  . Cancer ([place])    left breast  . Chronic bronchitis ([place])   . Family history of breast cancer   . Family history of ovarian cancer   . Family history of prostate cancer   . GERD (gastroesophageal reflux disease)    due to chemotherapy  . Lactose intolerance   . PONV (postoperative nausea and vomiting)     Past Surgical History:  Procedure Laterality Date  . BREAST LUMPECTOMY WITH RADIOACTIVE SEED AND AXILLARY LYMPH NODE DISSECTION Left 07/12/2018   Procedure: LEFT BREAST RADIOACTIVE SEED X 3 LUMPECTOMY X 3  AND AXILLARY LYMPH NODE DISSECTION;  Surgeon: Jovita Kussmaul, MD;  Location: [place];  Service: General;  Laterality: Left;  . PORTACATH PLACEMENT Right 03/03/2018   Procedure: INSERTION PORT-A-CATH;  Surgeon: Jovita Kussmaul, MD;  Location: [place];  Service: General;  Laterality: Right;  . ROTATOR CUFF REPAIR Right 2006  . TOTAL ABDOMINAL HYSTERECTOMY  2010    There were no vitals filed for this visit.  Subjective Assessment - 08/31/18 0832    Subjective  My shoulder has been popping a lot over the weekend and it is limiting me. It is now sore.     Pertinent History  L breast cancer, left lumpectomy 07/12/18 ALND (11 nodes removed all negative), pt has completed  chemo and begins radiation next week    Patient Stated Goals  to get ROM back in my arm and shoulder, decrease swelling in my side    Currently in Pain?  No/denies         [facility] PT Assessment - 08/31/18 0001      AROM   Left Shoulder Flexion  127 Degrees    Left Shoulder ABduction  116 Degrees              Outpatient Rehab from 08/24/2018 [facility]  Lymphedema Life Impact Scale Total Score  5.88 %           [facility] Adult PT Treatment/Exercise - 08/31/18 0001      Exercises   Exercises  Shoulder      Shoulder Exercises: Supine   Horizontal ABduction  Strengthening;Both;5 reps   with yellow band v/c to keep muscles activated throughout   Theraband Level (Shoulder Horizontal ABduction)  Level 1 (Yellow)    Flexion  Strengthening;Both;5 reps   narrow grip only, v/c to keep muscles activated throughout     Manual Therapy   Manual Therapy  Myofascial release;Passive ROM;Scapular mobilization;Edema management    Edema Management  created chip pack for pt to wear in her bra for additional compression    Myofascial Release  to cording from  left axilla to wrist using cross hand technique    Scapular Mobilization  in righ s/l to L scapula in direction of protraction and retraction while moving LUE into flexion    Passive ROM  to left UE in direction of flexion, abduction and ER with gentle posterior pressure on anterior shoulder to keep shoulder stabilized                  PT Long Term Goals - 08/24/18 1454      PT LONG TERM GOAL #1   Title  Pt will demonstrate 160 degrees of left shoulder flexion to allow her to reach overhead.    Baseline  125    Time  4    Period  Weeks    Status  New    Target Date  09/21/18      PT LONG TERM GOAL #2   Title  Pt will demonstrate 160 degrees of left shoulder abduction to allow her to reach out to the side    Baseline  80    Time  4    Period  Weeks    Status  New    Target Date   09/21/18      PT LONG TERM GOAL #3   Title  Pt will be independent in a home exercise program for continued strengthening and stretching    Time  4    Period  Weeks    Status  New    Target Date  09/21/18      PT LONG TERM GOAL #4   Title  Pt will report a 50% improvement in swelling in left lateral trunk to allow improved comfort.    Time  4    Period  Weeks    Status  New    Target Date  09/21/18      PT LONG TERM GOAL #5   Title  Pt will report a 75% improvement in pain and tightness in left arm due to cording to allow improved function    Time  4    Period  Weeks    Status  New    Target Date  09/21/18            Plan - 08/31/18 7939    Clinical Impression Statement  Continued with myofascial release to cording. Pt is having difficulty moving left shoulder due to popping when she tries to raise her LUE. Performed PROM today with gentle posterior pressure on anterior shoulder to stabilize and pt did not feel popping with this. Began instruction in shoulder stabilization exercises today as pt did not have popping with this. Educated her to only do cane exercises if she does not have popping. Created chip pack for pt to wear in her bra to decrease lateral trunk edema.     Rehab Potential  Good    PT Frequency  2x / week    PT Duration  4 weeks    PT Treatment/Interventions  ADLs/Self Care Home Management;Therapeutic exercise;Patient/family education;Therapeutic activities;Manual techniques;Manual lymph drainage;Scar mobilization;Passive range of motion;Taping;Joint Manipulations    PT Next Visit Plan  see how chip pack worked. begin MLD and instruct in self MLD left lateral trunk, teach rest of supine scap, myofascial to cording in left antecubital fossa, see if pt brought in sports bra and if it has enough coverage for lateral trunk swelling, begin PROM left shoulder and give dowel exercises    PT Home Exercise Plan  neural stretch, narrow grip and  horiz abd with supine scap     Consulted and Agree with Plan of Care  Patient       Patient will benefit from skilled therapeutic intervention in order to improve the following deficits and impairments:  Pain, Increased fascial restricitons, Decreased knowledge of use of DME, Decreased scar mobility, Postural dysfunction, Decreased range of motion, Decreased strength, Impaired UE functional use, Increased edema  Visit Diagnosis: Stiffness of left shoulder, not elsewhere classified  Acute pain of left shoulder  Lymphedema, not elsewhere classified  Muscle weakness (generalized)  Abnormal posture     Problem List Patient Active Problem List   Diagnosis Date Noted  . Carcinoma of upper-outer quadrant of left breast in female, estrogen receptor negative ([place]) 07/12/2018  . Seizure-like activity ([place])   . Hyponatremia   . Recurrent syncope 03/08/2018  . LFT elevation   . Seizure ([place])   . Thrombocytopenia ([place])   . Port-A-Cath in place 03/04/2018  . Genetic testing 02/23/2018  . Family history of breast cancer   . Family history of prostate cancer   . Family history of ovarian cancer   . Malignant neoplasm of left female breast ([place]) 02/03/2018  . Prediabetes 01/06/2018  . Reactive airway disease without complication 25/00/3704  . Class 1 obesity with serious comorbidity and body mass index (BMI) of 32.0 to 32.9 in adult 12/22/2017    Allyson Sabal [facility] 08/31/2018, 9:26 AM  [name], [place], [address] Phone: [phone]   Fax:  [phone]  Name: Christy Serrano MRN: [id number] Date of Birth: Jul 05, 1955  Manus Gunning, PT 08/31/18 9:27 AM

## 2018-09-01 ENCOUNTER — Ambulatory Visit (INDEPENDENT_AMBULATORY_CARE_PROVIDER_SITE_OTHER): Payer: Managed Care, Other (non HMO) | Admitting: Bariatrics

## 2018-09-01 ENCOUNTER — Ambulatory Visit
Admission: RE | Admit: 2018-09-01 | Discharge: 2018-09-01 | Disposition: A | Discharge status: Home / Self Care | Payer: Managed Care, Other (non HMO) | Source: Ambulatory Visit | Attending: Radiation Oncology | Admitting: Radiation Oncology

## 2018-09-01 ENCOUNTER — Other Ambulatory Visit: Payer: Self-pay

## 2018-09-01 ENCOUNTER — Ambulatory Visit (INDEPENDENT_AMBULATORY_CARE_PROVIDER_SITE_OTHER): Payer: 59 | Admitting: Bariatrics

## 2018-09-01 ENCOUNTER — Ambulatory Visit: Payer: Managed Care, Other (non HMO)

## 2018-09-01 ENCOUNTER — Encounter (INDEPENDENT_AMBULATORY_CARE_PROVIDER_SITE_OTHER): Payer: Self-pay | Admitting: Bariatrics

## 2018-09-01 DIAGNOSIS — J45909 Unspecified asthma, uncomplicated: Secondary | ICD-10-CM | POA: Diagnosis not present

## 2018-09-01 DIAGNOSIS — R7303 Prediabetes: Secondary | ICD-10-CM

## 2018-09-01 DIAGNOSIS — Z6830 Body mass index (BMI) 30.0-30.9, adult: Secondary | ICD-10-CM

## 2018-09-01 DIAGNOSIS — E669 Obesity, unspecified: Secondary | ICD-10-CM | POA: Diagnosis not present

## 2018-09-01 DIAGNOSIS — C50412 Malignant neoplasm of upper-outer quadrant of left female breast: Secondary | ICD-10-CM

## 2018-09-01 MED ORDER — RADIAPLEXRX EX GEL
Freq: Once | CUTANEOUS | Status: AC
  Administered 2018-09-01: 12:00:00 via TOPICAL

## 2018-09-01 MED ORDER — ALRA NON-METALLIC DEODORANT (RAD-ONC)
1.0000 "application " | Freq: Once | TOPICAL | Status: AC
  Administered 2018-09-01: 1 via TOPICAL

## 2018-09-01 NOTE — Progress Notes (Signed)

## 2018-09-02 ENCOUNTER — Ambulatory Visit
Admission: RE | Admit: 2018-09-02 | Discharge: 2018-09-02 | Disposition: A | Discharge status: Home / Self Care | Payer: Managed Care, Other (non HMO) | Source: Ambulatory Visit | Attending: Radiation Oncology | Admitting: Radiation Oncology

## 2018-09-02 ENCOUNTER — Ambulatory Visit: Payer: Managed Care, Other (non HMO)

## 2018-09-02 ENCOUNTER — Telehealth (HOSPITAL_COMMUNITY): Payer: Self-pay

## 2018-09-02 ENCOUNTER — Other Ambulatory Visit: Payer: Self-pay

## 2018-09-02 DIAGNOSIS — M6281 Muscle weakness (generalized): Secondary | ICD-10-CM

## 2018-09-02 DIAGNOSIS — M25612 Stiffness of left shoulder, not elsewhere classified: Secondary | ICD-10-CM

## 2018-09-02 DIAGNOSIS — C50412 Malignant neoplasm of upper-outer quadrant of left female breast: Secondary | ICD-10-CM | POA: Diagnosis not present

## 2018-09-02 DIAGNOSIS — I890 Lymphedema, not elsewhere classified: Secondary | ICD-10-CM

## 2018-09-02 DIAGNOSIS — M25512 Pain in left shoulder: Secondary | ICD-10-CM

## 2018-09-02 DIAGNOSIS — R293 Abnormal posture: Secondary | ICD-10-CM

## 2018-09-02 NOTE — Telephone Encounter (Signed)

## 2018-09-02 NOTE — Progress Notes (Signed)
Office: [phone]  /  Fax: [phone] TeleHealth Visit:  Christy Serrano has verbally consented to this TeleHealth visit today. The patient is located at home, the provider is located at the [employer] [facility] office. The participants in this visit include the listed provider and patient and any and all parties involved. The visit was conducted today via WebEx.  HPI:   Chief Complaint: OBESITY Christy Serrano is here to discuss her progress with her obesity treatment plan. She is on the Category 3 plan and is following her eating plan approximately 0 % of the time. She states she is walking 40 to 45 minutes 2 times per week. Christy Serrano states that she has lost weight since she was seen last (visit 06/07/18). She states that as she has undergone cancer treatment, that she has lost her taste. Her reported weight is 171 pounds. We were unable to weigh the patient today for this TeleHealth visit. She feels as if she has lost weight since her last visit. She has lost 24 lbs since starting treatment with [place].  Pre-Diabetes Christy Serrano has a diagnosis of prediabetes based on her elevated Hgb A1c and was informed this puts her at greater risk of developing diabetes. She is not taking metformin currently and continues to work on diet and exercise to decrease risk of diabetes. She denies polyphagia.  Asthma Breslyn has a diagnosis of asthma and she uses Ventolin inhaler.  ASSESSMENT AND PLAN:  Prediabetes  Uncomplicated asthma, unspecified asthma severity, unspecified whether persistent  Class 1 obesity with serious comorbidity and body mass index (BMI) of 30.0 to 30.9 in adult, unspecified obesity type - Starting BMI greater then 30  PLAN:  Pre-Diabetes [name] will continue to work on weight loss, exercise, increasing lean protein and decreasing simple carbohydrates in her diet to help decrease the risk of diabetes. She was informed that eating too many simple carbohydrates or too many  calories at one sitting increases the likelihood of GI side effects. [name] agreed to follow up with [place] as directed to monitor her progress.  Asthma Christy Serrano will continue her medications and she will follow up with her PCP. [name] will follow up with our clinic at the agreed upon time.  Obesity Christy Serrano is currently in the action stage of change. As such, her goal is to continue with weight loss efforts She has agreed to follow the Category 3 plan Christy Serrano will continue her exercise regimen and she will increase exercise for weight loss and overall health benefits. We discussed the following Behavioral Modification Strategies today: increase H2O intake, no skipping meals, keeping healthy foods in the home, increasing lean protein intake, decreasing simple carbohydrates, increasing vegetables, decrease eating out and work on meal planning and easy cooking plans Christy Serrano will weigh herself at home and record until she returns to the office. Christy Serrano will get back on the program.  Christy Serrano has agreed to follow up with our clinic in 2 weeks. She was informed of the importance of frequent follow up visits to maximize her success with intensive lifestyle modifications for her multiple health conditions.  ALLERGIES: Allergies  Allergen Reactions  . Oxycodone-Acetaminophen Other (See Comments) and Palpitations    tachycardia   . Gabapentin Swelling    Lower extremity edema  . Amoxicillin Rash    DID THE REACTION INVOLVE: Swelling of the face/tongue/throat, SOB, or low BP? No Sudden or severe rash/hives, skin peeling, or the inside of the mouth or nose? No Did it require medical treatment? No When did  it last happen?in her 30's If all above answers are "NO", may proceed with cephalosporin use.     MEDICATIONS: Current Outpatient Medications on File Prior to Visit  Medication Sig Dispense Refill  . acetaminophen (TYLENOL) 500 MG tablet Take 1,000 mg by mouth every 6 (six) hours as  needed for mild pain.    Marland Kitchen albuterol (PROVENTIL HFA;VENTOLIN HFA) 108 (90 Base) MCG/ACT inhaler Inhale into the lungs every 6 (six) hours as needed for wheezing or shortness of breath.    . budesonide-formoterol (SYMBICORT) 160-4.5 MCG/ACT inhaler Inhale 2 puffs into the lungs 2 (two) times daily.    . bumetanide (BUMEX) 1 MG tablet Take 1 tablet (1 mg total) by mouth 2 (two) times daily. 60 tablet 0  . Calcium-Phosphorus-Vitamin D (CITRACAL +D3 PO) Take 1,200 mcg by mouth.    . cholecalciferol (VITAMIN D) 1000 units tablet Take 1,000 Units by mouth daily.    Marland Kitchen estradiol (VIVELLE-DOT) 0.05 MG/24HR patch     . gabapentin (NEURONTIN) 100 MG capsule Take 1 capsule (100 mg total) by mouth daily. 90 capsule 3  . HYDROcodone-acetaminophen (NORCO/VICODIN) 5-325 MG tablet Take 1 tablet by mouth every 6 (six) hours as needed for moderate pain or severe pain. 30 tablet 0  . Multiple Vitamin (MULTIVITAMIN) capsule Take 1 capsule by mouth daily.    . potassium chloride SA (K-DUR) 20 MEQ tablet Take 1 tablet (20 mEq total) by mouth daily. 90 tablet 1  . benzonatate (TESSALON) 200 MG capsule Take 1 capsule (200 mg total) by mouth 3 (three) times daily. (Patient not taking: Reported on 09/01/2018) 21 capsule 2   No current facility-administered medications on file prior to visit.     PAST MEDICAL HISTORY: Past Medical History:  Diagnosis Date  . Cancer ([place])    left breast  . Chronic bronchitis ([place])   . Family history of breast cancer   . Family history of ovarian cancer   . Family history of prostate cancer   . GERD (gastroesophageal reflux disease)    due to chemotherapy  . Lactose intolerance   . PONV (postoperative nausea and vomiting)     PAST SURGICAL HISTORY: Past Surgical History:  Procedure Laterality Date  . BREAST LUMPECTOMY WITH RADIOACTIVE SEED AND AXILLARY LYMPH NODE DISSECTION Left 07/12/2018   Procedure: LEFT BREAST RADIOACTIVE SEED X 3 LUMPECTOMY X 3  AND AXILLARY LYMPH NODE  DISSECTION;  Surgeon: Jovita Kussmaul, MD;  Location: [place];  Service: General;  Laterality: Left;  . PORTACATH PLACEMENT Right 03/03/2018   Procedure: INSERTION PORT-A-CATH;  Surgeon: Jovita Kussmaul, MD;  Location: [place];  Service: General;  Laterality: Right;  . ROTATOR CUFF REPAIR Right 2006  . TOTAL ABDOMINAL HYSTERECTOMY  2010    SOCIAL HISTORY: Social History   Tobacco Use  . Smoking status: Never Smoker  . Smokeless tobacco: Never Used  Substance Use Topics  . Alcohol use: No    Comment: quit long ago  . Drug use: No    FAMILY HISTORY: Family History  Problem Relation Age of Onset  . High blood pressure Mother   . Heart attack Mother        CHF  . High blood pressure Father   . Prostate cancer Father        dx mid 37s  . Breast cancer Sister 75       negative [employer] 46 gene panel  . Breast cancer Paternal Aunt  dx mid 43s  . Cancer Paternal Uncle        jaw cancer d. 43s  . Ovarian cancer Maternal Grandmother        d. 35s  . Cancer Paternal Grandmother        either stomach or ovarian cancer  . Breast cancer Sister 30       "negative 21 gene panel"    ROS: Review of Systems  Constitutional: Positive for weight loss.  Respiratory: Negative for wheezing.   Endo/Heme/Allergies:       Negative for polyphagia    PHYSICAL EXAM: Pt in no acute distress  RECENT LABS AND TESTS: BMET    Component Value Date/Time   NA 140 08/20/2018 0845   NA 138 12/22/2017 1235   K 3.6 08/20/2018 0845   CL 101 08/20/2018 0845   CO2 28 08/20/2018 0845   GLUCOSE 83 08/20/2018 0845   BUN 27 (H) 08/20/2018 0845   BUN 12 12/22/2017 1235   CREATININE 1.27 (H) 08/20/2018 0845   CALCIUM 9.0 08/20/2018 0845   GFRNONAA 46 (L) 08/20/2018 0845   GFRAA 53 (L) 08/20/2018 0845   Lab Results  Component Value Date   HGBA1C 5.6 05/31/2018   HGBA1C 6.1 (H) 12/22/2017   Lab Results  Component Value Date   INSULIN 4.8 05/31/2018    INSULIN 13.7 12/22/2017   CBC    Component Value Date/Time   WBC 3.9 (L) 08/20/2018 0845   RBC 3.05 (L) 08/20/2018 0845   HGB 9.4 (L) 08/20/2018 0845   HGB 9.7 (L) 03/26/2018 0859   HGB 12.3 12/22/2017 1235   HCT 30.1 (L) 08/20/2018 0845   HCT 38.2 12/22/2017 1235   PLT 154 08/20/2018 0845   PLT 306 03/26/2018 0859   MCV 98.7 08/20/2018 0845   MCV 92 12/22/2017 1235   MCH 30.8 08/20/2018 0845   MCHC 31.2 08/20/2018 0845   RDW 15.1 08/20/2018 0845   RDW 14.8 12/22/2017 1235   LYMPHSABS 1.6 08/20/2018 0845   LYMPHSABS 2.0 12/22/2017 1235   MONOABS 0.3 08/20/2018 0845   EOSABS 0.1 08/20/2018 0845   EOSABS 0.0 12/22/2017 1235   BASOSABS 0.0 08/20/2018 0845   BASOSABS 0.0 12/22/2017 1235   Iron/TIBC/Ferritin/ %Sat No results found for: IRON, TIBC, FERRITIN, IRONPCTSAT Lipid Panel     Component Value Date/Time   CHOL 176 12/22/2017 1235   TRIG 111 12/22/2017 1235   HDL 61 12/22/2017 1235   LDLCALC 93 12/22/2017 1235   Hepatic Function Panel     Component Value Date/Time   PROT 7.6 08/20/2018 0845   PROT 7.6 12/22/2017 1235   ALBUMIN 4.0 08/20/2018 0845   ALBUMIN 4.8 12/22/2017 1235   AST 20 08/20/2018 0845   ALT 12 08/20/2018 0845   ALKPHOS 103 08/20/2018 0845   BILITOT 0.6 08/20/2018 0845      Component Value Date/Time   TSH 3.644 03/10/2018 0538   TSH 1.930 12/22/2017 1235     Ref. Range 12/22/2017 12:35  Vitamin D, 25-Hydroxy Latest Ref Range: 30.0 - 100.0 ng/mL 41.9    I, Doreene Nest, am acting as Location manager for [employer]. Owens Shark, DO  I have reviewed the above documentation for accuracy and completeness, and I agree with the above. -Jearld Lesch, DO

## 2018-09-03 ENCOUNTER — Encounter (HOSPITAL_COMMUNITY): Payer: Self-pay | Admitting: Internal Medicine

## 2018-09-03 ENCOUNTER — Ambulatory Visit (HOSPITAL_COMMUNITY)
Admission: RE | Admit: 2018-09-03 | Discharge: 2018-09-03 | Disposition: A | Discharge status: Home / Self Care | Payer: 59 | Source: Ambulatory Visit | Attending: Internal Medicine | Admitting: Internal Medicine

## 2018-09-03 ENCOUNTER — Ambulatory Visit (HOSPITAL_BASED_OUTPATIENT_CLINIC_OR_DEPARTMENT_OTHER)
Admission: RE | Admit: 2018-09-03 | Discharge: 2018-09-03 | Disposition: A | Discharge status: Home / Self Care | Payer: 59 | Source: Ambulatory Visit | Attending: Internal Medicine | Admitting: Internal Medicine

## 2018-09-03 ENCOUNTER — Ambulatory Visit
Admission: RE | Admit: 2018-09-03 | Discharge: 2018-09-03 | Disposition: A | Discharge status: Home / Self Care | Payer: Managed Care, Other (non HMO) | Source: Ambulatory Visit | Attending: Radiation Oncology | Admitting: Radiation Oncology

## 2018-09-03 ENCOUNTER — Other Ambulatory Visit: Payer: Self-pay

## 2018-09-03 VITALS — BP 140/86 | HR 80 | Wt 176.6 lb

## 2018-09-03 DIAGNOSIS — Z79899 Other long term (current) drug therapy: Secondary | ICD-10-CM | POA: Diagnosis not present

## 2018-09-03 DIAGNOSIS — E739 Lactose intolerance, unspecified: Secondary | ICD-10-CM | POA: Diagnosis not present

## 2018-09-03 DIAGNOSIS — Z888 Allergy status to other drugs, medicaments and biological substances status: Secondary | ICD-10-CM | POA: Diagnosis not present

## 2018-09-03 DIAGNOSIS — C50912 Malignant neoplasm of unspecified site of left female breast: Secondary | ICD-10-CM | POA: Diagnosis present

## 2018-09-03 DIAGNOSIS — Z7951 Long term (current) use of inhaled steroids: Secondary | ICD-10-CM | POA: Diagnosis not present

## 2018-09-03 DIAGNOSIS — Z886 Allergy status to analgesic agent status: Secondary | ICD-10-CM | POA: Diagnosis not present

## 2018-09-03 DIAGNOSIS — Z171 Estrogen receptor negative status [ER-]: Secondary | ICD-10-CM

## 2018-09-03 DIAGNOSIS — Z8042 Family history of malignant neoplasm of prostate: Secondary | ICD-10-CM | POA: Insufficient documentation

## 2018-09-03 DIAGNOSIS — R600 Localized edema: Secondary | ICD-10-CM | POA: Insufficient documentation

## 2018-09-03 DIAGNOSIS — C50412 Malignant neoplasm of upper-outer quadrant of left female breast: Secondary | ICD-10-CM

## 2018-09-03 DIAGNOSIS — Z8041 Family history of malignant neoplasm of ovary: Secondary | ICD-10-CM | POA: Insufficient documentation

## 2018-09-03 DIAGNOSIS — Z880 Allergy status to penicillin: Secondary | ICD-10-CM | POA: Diagnosis not present

## 2018-09-03 DIAGNOSIS — Z803 Family history of malignant neoplasm of breast: Secondary | ICD-10-CM | POA: Diagnosis not present

## 2018-09-03 MED ORDER — SPIRONOLACTONE 25 MG PO TABS: 12.5000 mg | ORAL_TABLET | Freq: Every day | ORAL | 3 refills | Status: DC

## 2018-09-03 MED FILL — SPIRONOLACTONE 25 MG TABLET: 25 | 30 days supply | Qty: 45 | Fill #0

## 2018-09-03 NOTE — Patient Instructions (Signed)
Take Spironolactone 12.5mg  by mouth daily.  Follow up and echo in 3 months

## 2018-09-03 NOTE — Progress Notes (Signed)
  Echocardiogram 2D Echocardiogram has been performed.  Christy Serrano 09/03/2018, 9:49 AM

## 2018-09-03 NOTE — Progress Notes (Signed)
CARDIO-ONCOLOGY CLINIC CONSULT NOTE  Referring Physician: Lindi Adie  Primary Cardiologist: None   HPI:  Christy Serrano is 63 y.o. female with left breast cancer referred by Dr. Lindi Adie for enrollment into the Cardio-Oncology program.   SUMMARY OF ONCOLOGIC HISTORY:       Malignant neoplasm of left female breast ([place])   01/18/2018 Initial Diagnosis    Screening mammogram detected microcalcifications and asymmetry, left breast segmental microcalcifications UOQ 1.5 x 4.3 x 5.4 cm biopsy-proven IDC with DCIS with LV I, grade 3, ER 0%, PR 0%, Ki-67 30%, HER-2 +3+ by IHC; hypoechoic mass 2 o'clock position 1.2 x 1.3 x 1.7 cm biopsy IDC, LV I present, grade 2-3, ER 0%, PR 0%, Ki-67 40%, HER-2 +3+, left axillary lymph node positive (3 nodes noted by [place]) T3 N1 stage IIIA    02/03/2018 Cancer Staging    Staging form: Breast, AJCC 8th Edition - Clinical: Stage IIIA (cT3, cN1, cM0, G3, ER-, PR-, HER2+) - Signed by Nicholas Lose, MD on 02/03/2018    02/20/2018 Genetic Testing    No pathogenic variants identified on the [facility] Expanded + RNA insight panel. Genes Analyzed (67 total): AIP, ALK, APC*, ATM*, BAP1, BARD1, BLM, BMPR1A, BRCA1*, BRCA2*, BRIP1*, CDH1*, CDK4, CDKN1B, CDKN2A, CHEK2*, DICER1, FANCC, FH, FLCN, GALNT12, HOXB13, MAX, MEN1, MET, MLH1*, MRE11A, MSH2*, MSH6*, MUTYH*, NBN, NF1*, NF2, PALB2*, PHOX2B, PMS2*, POLD1, POLE, POT1, PRKAR1A, PTCH1, PTEN*, RAD50, RAD51C*, RAD51D*, RB1, RET, SDHA, SDHAF2, SDHB, SDHC, SDHD, SMAD4, SMARCA4, SMARCB1, SMARCE1, STK11, SUFU, TMEM127, TP53*, TSC1, TSC2, VHL and XRCC2 (sequencing and deletion/duplication); MITF (sequencing only); EPCAM and GREM1 (deletion/duplication only). DNA and RNA analyses performed for * genes. The report date is 02/20/2018.    03/04/2018 -  Neo-Adjuvant Chemotherapy    Neoadjuvant chemotherapy with [facility] Perjeta    03/08/2018 - 03/10/2018 Hospital Admission    Admitted for recurrent syncope with loss of  consciousness for up to 5 minutes with loss of bladder, no abnormalities identified.  MRI brain, carotid ultrasound, EEG studies were negative.     Finished chemo 06/17/18. Now receiving Herceptin/Perjeta and XRT. Feeling better. No SOB, orthopnea or PND. Developed LE edema. Now on bumex 1bid.   Echo today EF 60-65% GLS -25.9% Personally reviewed   Echo 3/20  EF 60-65% Grade I DD GLS -23.1% Personally reviewed Echo 03/02/18  LVEF of 60-65%     Past Medical History:  Diagnosis Date  . Cancer ([place])    left breast  . Chronic bronchitis ([place])   . Family history of breast cancer   . Family history of ovarian cancer   . Family history of prostate cancer   . GERD (gastroesophageal reflux disease)    due to chemotherapy  . Lactose intolerance   . PONV (postoperative nausea and vomiting)     Current Outpatient Medications  Medication Sig Dispense Refill  . acetaminophen (TYLENOL) 500 MG tablet Take 1,000 mg by mouth every 6 (six) hours as needed for mild pain.    Marland Kitchen albuterol (PROVENTIL HFA;VENTOLIN HFA) 108 (90 Base) MCG/ACT inhaler Inhale into the lungs every 6 (six) hours as needed for wheezing or shortness of breath.    . budesonide-formoterol (SYMBICORT) 160-4.5 MCG/ACT inhaler Inhale 2 puffs into the lungs 2 (two) times daily.    . bumetanide (BUMEX) 1 MG tablet Take 1 tablet (1 mg total) by mouth 2 (two) times daily. 60 tablet 0  . Calcium-Phosphorus-Vitamin D (CITRACAL +D3 PO) Take 1,200 mcg by mouth.    . cholecalciferol (VITAMIN D) 1000 units  tablet Take 1,000 Units by mouth daily.    Marland Kitchen estradiol (VIVELLE-DOT) 0.05 MG/24HR patch     . gabapentin (NEURONTIN) 100 MG capsule Take 1 capsule (100 mg total) by mouth daily. 90 capsule 3  . HYDROcodone-acetaminophen (NORCO/VICODIN) 5-325 MG tablet Take 1 tablet by mouth every 6 (six) hours as needed for moderate pain or severe pain. 30 tablet 0  . Multiple Vitamin (MULTIVITAMIN) capsule Take 1 capsule by mouth daily.    . potassium  chloride SA (K-DUR) 20 MEQ tablet Take 1 tablet (20 mEq total) by mouth daily. 90 tablet 1   No current facility-administered medications for this encounter.     Allergies  Allergen Reactions  . Oxycodone-Acetaminophen Other (See Comments) and Palpitations    tachycardia   . Gabapentin Swelling    Lower extremity edema  . Amoxicillin Rash    DID THE REACTION INVOLVE: Swelling of the face/tongue/throat, SOB, or low BP? No Sudden or severe rash/hives, skin peeling, or the inside of the mouth or nose? No Did it require medical treatment? No When did it last happen?in her 80's If all above answers are "NO", may proceed with cephalosporin use.       Social History   Socioeconomic History  . Marital status: Single    Spouse name: Not on file  . Number of children: 0  . Years of education: 35  . Highest education level: Not on file  Occupational History  . Occupation: Quarry manager    Comment: [employer]  . Financial resource strain: Not on file  . Food insecurity    Worry: Not on file    Inability: Not on file  . Transportation needs    Medical: No    Non-medical: No  Tobacco Use  . Smoking status: Never Smoker  . Smokeless tobacco: Never Used  Substance and Sexual Activity  . Alcohol use: No    Comment: quit long ago  . Drug use: No  . Sexual activity: Not on file  Lifestyle  . Physical activity    Days per week: Not on file    Minutes per session: Not on file  . Stress: Not on file  Relationships  . Social Herbalist on phone: Not on file    Gets together: Not on file    Attends religious service: Not on file    Active member of club or organization: Not on file    Attends meetings of clubs or organizations: Not on file    Relationship status: Not on file  . Intimate partner violence    Fear of current or ex partner: No    Emotionally abused: No    Physically abused: No    Forced sexual activity: No  Other Topics Concern  . Not on  file  Social History Narrative   Lives with sister   Caffeine- rarely      Family History  Problem Relation Age of Onset  . High blood pressure Mother   . Heart attack Mother        CHF  . High blood pressure Father   . Prostate cancer Father        dx mid 18s  . Breast cancer Sister 56       negative [employer] 46 gene panel  . Breast cancer Paternal Aunt        dx mid 58s  . Cancer Paternal Uncle        jaw cancer d.  69s  . Ovarian cancer Maternal Grandmother        d. 53s  . Cancer Paternal Grandmother        either stomach or ovarian cancer  . Breast cancer Sister 65       "negative 21 gene panel"    Vitals:   09/03/18 0948  BP: 140/86  Pulse: 80  SpO2: 98%  Weight: 80.1 kg (176 lb 9.6 oz)    PHYSICAL EXAM: General:  Well appearing. No resp difficulty HEENT: normal Neck: supple. no JVD. Carotids 2+ bilat; no bruits. No lymphadenopathy or thryomegaly appreciated. R port Cor: PMI nondisplaced. Regular rate & rhythm. No rubs, gallops or murmurs. Lungs: clear Abdomen: soft, nontender, nondistended. No hepatosplenomegaly. No bruits or masses. Good bowel sounds. Extremities: no cyanosis, clubbing, rash, 1+ edema Neuro: alert & orientedx3, cranial nerves grossly intact. moves all 4 extremities w/o difficulty. Affect pleasant    ASSESSMENT & PLAN: 1. Left Breast Cancer - I reviewed echos personally. EF and Doppler parameters stable. No HF on exam. Continue Herceptin/Perjeta .   2. LE edema - improved with Bumex. Use compression hose as needed.  - will add spironolactone to help spare potassium - repeat BMET 1 week at [facility]. May be able to cut potassium supp back  Glori Bickers, MD  10:18 AM

## 2018-09-03 NOTE — Therapy (Signed)
[place], [place], [address] Phone: [phone]   Fax:  [phone]  Physical Therapy Treatment  Patient Details  Name: [name] MRN: [id number] Date of Birth: Feb 17, 1956 Referring Provider (PT): Joaquin Courts Date: 09/02/2018  PT End of Session - 09/02/18 0954    Visit Number  4    Number of Visits  9    Date for PT Re-Evaluation  09/21/18    PT Start Time  0904    PT Stop Time  0953    PT Time Calculation (min)  49 min    Activity Tolerance  Patient tolerated treatment well    Behavior During Therapy  [facility] for tasks assessed/performed       Past Medical History:  Diagnosis Date  . Cancer ([place])    left breast  . Chronic bronchitis ([place])   . Family history of breast cancer   . Family history of ovarian cancer   . Family history of prostate cancer   . GERD (gastroesophageal reflux disease)    due to chemotherapy  . Lactose intolerance   . PONV (postoperative nausea and vomiting)     Past Surgical History:  Procedure Laterality Date  . BREAST LUMPECTOMY WITH RADIOACTIVE SEED AND AXILLARY LYMPH NODE DISSECTION Left 07/12/2018   Procedure: LEFT BREAST RADIOACTIVE SEED X 3 LUMPECTOMY X 3  AND AXILLARY LYMPH NODE DISSECTION;  Surgeon: Jovita Kussmaul, MD;  Location: [place];  Service: General;  Laterality: Left;  . PORTACATH PLACEMENT Right 03/03/2018   Procedure: INSERTION PORT-A-CATH;  Surgeon: Jovita Kussmaul, MD;  Location: [place];  Service: General;  Laterality: Right;  . ROTATOR CUFF REPAIR Right 2006  . TOTAL ABDOMINAL HYSTERECTOMY  2010    There were no vitals filed for this visit.  Subjective Assessment - 09/02/18 0911    Subjective  I was trying to move my Lt arm this morning and felt the popping. I tried using the chip pack but it was too big to fit in my bra so it's under a book at my house right now trying to flatten it.    Currently  in Pain?  No/denies                  Outpatient Re[facility] from [facility]24/[facility]  Lymphedema Life Impact Scale Total Score  5.88 %           [facility] Adult PT Treatment/Exercise - 09/02/18 0001      Manual Therapy   Manual Therapy  Edema management;Myofascial release;Manual Lymphatic Drainage (MLD);Passive ROM;Neural Stretch    Edema Management  issued lined peach dotted foam for Lt lateral trunk/breast and issued TG soft size small for Lt UE    Myofascial Release  to cording from left axilla to wrist using cross hand technique, and especially at antecubital fossa; also Lt UE pulling throughout P/ROM, small pop felt at wrist during this    Scapular Mobilization  --    Manual Lymphatic Drainage (MLD)  In Supine: Short neck, superficial and deep abdominals (instruction required for proper diaphragmatic breathing), Lt inguinal and Rt axillary nodes, Lt axillo-inguinal and anterior inter-axillary anastomosis, then Lt UE working from lateral shoulder to forearm proximal to distal retracing all steps to help decrease cording.     Passive ROM  to left UE in direction of flexion, abduction and D2 with gentle posterior pressure on anterior shoulder to keep shoulder stabilized  and scapular depression    Neural Stretch  To Lt UE during P/ROM                  PT Long Term Goals - 08/24/18 1454      PT LONG TERM GOAL #1   Title  Pt will demonstrate 160 degrees of left shoulder flexion to allow her to reach overhead.    Baseline  125    Time  4    Period  Weeks    Status  New    Target Date  09/21/18      PT LONG TERM GOAL #2   Title  Pt will demonstrate 160 degrees of left shoulder abduction to allow her to reach out to the side    Baseline  80    Time  4    Period  Weeks    Status  New    Target Date  09/21/18      PT LONG TERM GOAL #3   Title  Pt will be independent in a home exercise program for continued strengthening and  stretching    Time  4    Period  Weeks    Status  New    Target Date  09/21/18      PT LONG TERM GOAL #4   Title  Pt will report a 50% improvement in swelling in left lateral trunk to allow improved comfort.    Time  4    Period  Weeks    Status  New    Target Date  09/21/18      PT LONG TERM GOAL #5   Title  Pt will report a 75% improvement in pain and tightness in left arm due to cording to allow improved function    Time  4    Period  Weeks    Status  New    Target Date  09/21/18            Plan - 09/02/18 0955    Clinical Impression Statement  Continued with focus on manual therapy to Lt upper quadrant, including manual lymph drainage today instructing pt in basics of anatomy of  lymphatic system. Small pop felt at wrist early on in session. Instructed pt in including elbow extension during day with arm on countertop, or in sitting edge of bed with hand on bed next to her and working UE into elbow extension as tolerated.    Personal Factors and Comorbidities  Other   pt starting radiation   Examination-Activity Limitations  Reach Overhead;Carry    Examination-Participation Restrictions  Cleaning;Meal Prep    Rehab Potential  Good    PT Frequency  2x / week    PT Duration  4 weeks    PT Treatment/Interventions  ADLs/Self Care Home Management;Therapeutic exercise;Patient/family education;Therapeutic activities;Manual techniques;Manual lymph drainage;Scar mobilization;Passive range of motion;Taping;Joint Manipulations    PT Next Visit Plan  see how chip pack and new foam worked. cont MLD and instruct in self MLD left lateral trunk, teach rest of supine scap, myofascial to cording in left antecubital fossa, see if pt brought in sports bra and if it has enough coverage for lateral trunk swelling, begin PROM left shoulder and give dowel exercises    Consulted and Agree with Plan of Care  Patient       Patient will benefit from skilled therapeutic intervention in order to  improve the following deficits and impairments:  Pain, Increased fascial restricitons, Decreased knowledge of use of  DME, Decreased scar mobility, Postural dysfunction, Decreased range of motion, Decreased strength, Impaired UE functional use, Increased edema  Visit Diagnosis: Stiffness of left shoulder, not elsewhere classified  Acute pain of left shoulder  Lymphedema, not elsewhere classified  Muscle weakness (generalized)  Abnormal posture     Problem List Patient Active Problem List   Diagnosis Date Noted  . Carcinoma of upper-outer quadrant of left breast in female, estrogen receptor negative ([place]) 07/12/2018  . Seizure-like activity ([place])   . Hyponatremia   . Recurrent syncope 03/08/2018  . LFT elevation   . Seizure ([place])   . Thrombocytopenia ([place])   . Port-A-Cath in place 03/04/2018  . Genetic testing 02/23/2018  . Family history of breast cancer   . Family history of prostate cancer   . Family history of ovarian cancer   . Malignant neoplasm of left female breast ([place]) 02/03/2018  . Prediabetes 01/06/2018  . Reactive airway disease without complication 16/12/9602  . Class 1 obesity with serious comorbidity and body mass index (BMI) of 32.0 to 32.9 in adult 12/22/2017    Otelia Limes, PTA 09/03/2018, 12:00 AM  [place], [place], [address] Phone: [phone]   Fax:  [phone]  Name: Christy Serrano MRN: [id number] Date of Birth: 04/23/1955

## 2018-09-06 ENCOUNTER — Ambulatory Visit
Admission: RE | Admit: 2018-09-06 | Discharge: 2018-09-06 | Disposition: A | Discharge status: Home / Self Care | Payer: Managed Care, Other (non HMO) | Source: Ambulatory Visit | Attending: Radiation Oncology | Admitting: Radiation Oncology

## 2018-09-06 ENCOUNTER — Other Ambulatory Visit: Payer: Self-pay

## 2018-09-06 DIAGNOSIS — C50412 Malignant neoplasm of upper-outer quadrant of left female breast: Secondary | ICD-10-CM | POA: Diagnosis not present

## 2018-09-07 ENCOUNTER — Other Ambulatory Visit: Payer: Self-pay

## 2018-09-07 ENCOUNTER — Ambulatory Visit
Admission: RE | Admit: 2018-09-07 | Discharge: 2018-09-07 | Disposition: A | Discharge status: Home / Self Care | Payer: Managed Care, Other (non HMO) | Source: Ambulatory Visit | Attending: Radiation Oncology | Admitting: Radiation Oncology

## 2018-09-07 ENCOUNTER — Ambulatory Visit: Payer: Managed Care, Other (non HMO)

## 2018-09-07 DIAGNOSIS — C50412 Malignant neoplasm of upper-outer quadrant of left female breast: Secondary | ICD-10-CM | POA: Diagnosis not present

## 2018-09-07 DIAGNOSIS — M25512 Pain in left shoulder: Secondary | ICD-10-CM

## 2018-09-07 DIAGNOSIS — M25612 Stiffness of left shoulder, not elsewhere classified: Secondary | ICD-10-CM

## 2018-09-07 DIAGNOSIS — R293 Abnormal posture: Secondary | ICD-10-CM

## 2018-09-07 DIAGNOSIS — I890 Lymphedema, not elsewhere classified: Secondary | ICD-10-CM

## 2018-09-07 DIAGNOSIS — M6281 Muscle weakness (generalized): Secondary | ICD-10-CM

## 2018-09-07 NOTE — Therapy (Signed)
[place], [place], [address] Phone: [phone]   Fax:  [phone]  Physical Therapy Treatment  Patient Details  Name: Christy Serrano MRN: [id number] Date of Birth: 06-02-1955 Referring Provider (PT): Joaquin Courts Date: 09/07/2018  PT End of Session - 09/07/18 0955    Visit Number  5    Number of Visits  9    Date for PT Re-Evaluation  09/21/18    PT Start Time  0907    PT Stop Time  0951    PT Time Calculation (min)  44 min    Activity Tolerance  Patient tolerated treatment well    Behavior During Therapy  [facility] for tasks assessed/performed       Past Medical History:  Diagnosis Date  . Cancer ([place])    left breast  . Chronic bronchitis ([place])   . Family history of breast cancer   . Family history of ovarian cancer   . Family history of prostate cancer   . GERD (gastroesophageal reflux disease)    due to chemotherapy  . Lactose intolerance   . PONV (postoperative nausea and vomiting)     Past Surgical History:  Procedure Laterality Date  . BREAST LUMPECTOMY WITH RADIOACTIVE SEED AND AXILLARY LYMPH NODE DISSECTION Left 07/12/2018   Procedure: LEFT BREAST RADIOACTIVE SEED X 3 LUMPECTOMY X 3  AND AXILLARY LYMPH NODE DISSECTION;  Surgeon: Jovita Kussmaul, MD;  Location: [place];  Service: General;  Laterality: Left;  . PORTACATH PLACEMENT Right 03/03/2018   Procedure: INSERTION PORT-A-CATH;  Surgeon: Jovita Kussmaul, MD;  Location: [place];  Service: General;  Laterality: Right;  . ROTATOR CUFF REPAIR Right 2006  . TOTAL ABDOMINAL HYSTERECTOMY  2010    There were no vitals filed for this visit.  Subjective Assessment - 09/07/18 0913    Subjective  My Lt shoulder is stil just popping all the time. Not hurting so much, just popping with certain movements. I've been using the foam at lateral trunk and it's been helping with the swelling there, the skin  feels firmer now.    Pertinent History  L breast cancer, left lumpectomy 07/12/18 ALND (11 nodes removed all negative), pt has completed chemo and begins radiation next week    Patient Stated Goals  to get ROM back in my arm and shoulder, decrease swelling in my side    Currently in Pain?  No/denies         [facility] PT Assessment - 09/07/18 0001      AROM   Left Shoulder Flexion  133 Degrees    Left Shoulder ABduction  120 Degrees   cording very visible at antecubital fossa and very tight             Outpatient Re[facility] from 08/24/2018 [facility]  Lymphedema Life Impact Scale Total Score  5.88 %           [facility] Adult PT Treatment/Exercise - 09/07/18 0001      Shoulder Exercises: Seated   External Rotation  Strengthening;Both;10 reps;Theraband    Theraband Level (Shoulder External Rotation)  Level 1 (Yellow)    External Rotation Limitations  VCs and demo throughout for correct technique, also put washcloths at elbows for cuing to keep elbows at sides      Shoulder Exercises: Standing   Row  Strengthening;Right;Left;5 reps;Theraband    Theraband Level (Shoulder Row)  Level 1 (Yellow)  Row Limitations  Forearms on wall with elbows at chest level, alternating Rt, then Lt pulling elbow down into scapular retraction      Manual Therapy   Myofascial Release  to cording from left axilla to wrist using cross hand technique, and especially at antecubital fossa; also Lt UE pulling throughout P/ROM, small pop felt at wrist during this    Manual Lymphatic Drainage (MLD)  In Supine: Short neck, 5 diaphragamtic breaths (brief review for proper diaphragmatic breathing, then pt did very well), Lt inguinal and Rt axillary nodes, Lt axillo-inguinal and anterior inter-axillary anastomosis, then Lt UE working from lateral shoulder to forearm proximal to distal retracing all steps to help decrease cording.     Passive ROM  to left UE in direction of flexion,  abduction and D2 with gentle posterior pressure on anterior shoulder to keep shoulder stabilized and scapular depression    Neural Stretch  To Lt UE during P/ROM                  PT Long Term Goals - 08/24/18 1454      PT LONG TERM GOAL #1   Title  Pt will demonstrate 160 degrees of left shoulder flexion to allow her to reach overhead.    Baseline  125    Time  4    Period  Weeks    Status  New    Target Date  09/21/18      PT LONG TERM GOAL #2   Title  Pt will demonstrate 160 degrees of left shoulder abduction to allow her to reach out to the side    Baseline  80    Time  4    Period  Weeks    Status  New    Target Date  09/21/18      PT LONG TERM GOAL #3   Title  Pt will be independent in a home exercise program for continued strengthening and stretching    Time  4    Period  Weeks    Status  New    Target Date  09/21/18      PT LONG TERM GOAL #4   Title  Pt will report a 50% improvement in swelling in left lateral trunk to allow improved comfort.    Time  4    Period  Weeks    Status  New    Target Date  09/21/18      PT LONG TERM GOAL #5   Title  Pt will report a 75% improvement in pain and tightness in left arm due to cording to allow improved function    Time  4    Period  Weeks    Status  New    Target Date  09/21/18            Plan - 09/07/18 0956    Clinical Impression Statement  Progressed pt with scapular stability which she tolerated well without feeling any popping and reported feeling challenged by these. Her A/ROM has improved with flexion and abduction and pt reports noticing, though still tight, her Lt upper quadrant has loosened.    Personal Factors and Comorbidities  Other   pt getting radiation   Examination-Activity Limitations  Reach Overhead;Carry    Examination-Participation Restrictions  Cleaning;Meal Prep    Rehab Potential  Good    PT Frequency  2x / week    PT Duration  4 weeks    PT Treatment/Interventions  ADLs/Self  Care  Home Management;Therapeutic exercise;Patient/family education;Therapeutic activities;Manual techniques;Manual lymph drainage;Scar mobilization;Passive range of motion;Taping;Joint Manipulations    PT Next Visit Plan  Cont MLD and instruct in self MLD left lateral trunk, teach rest of supine scap, myofascial to cording in left antecubital fossa, see if pt brought in sports bra and if it has enough coverage for lateral trunk swelling, begin PROM left shoulder and give dowel exercises    Consulted and Agree with Plan of Care  Patient       Patient will benefit from skilled therapeutic intervention in order to improve the following deficits and impairments:     Visit Diagnosis: 1. Stiffness of left shoulder, not elsewhere classified   2. Acute pain of left shoulder   3. Lymphedema, not elsewhere classified   4. Muscle weakness (generalized)   5. Abnormal posture        Problem List Patient Active Problem List   Diagnosis Date Noted  . Carcinoma of upper-outer quadrant of left breast in female, estrogen receptor negative ([place]) 07/12/2018  . Seizure-like activity ([place])   . Hyponatremia   . Recurrent syncope 03/08/2018  . LFT elevation   . Seizure ([place])   . Thrombocytopenia ([place])   . Port-A-Cath in place 03/04/2018  . Genetic testing 02/23/2018  . Family history of breast cancer   . Family history of prostate cancer   . Family history of ovarian cancer   . Malignant neoplasm of left female breast ([place]) 02/03/2018  . Prediabetes 01/06/2018  . Reactive airway disease without complication 03/16/8249  . Class 1 obesity with serious comorbidity and body mass index (BMI) of 32.0 to 32.9 in adult 12/22/2017    Otelia Limes, PTA 09/07/2018, 10:03 AM  [place], [place], [address] Phone: [phone]   Fax:  [phone]  Name: Christy Serrano MRN: [id number] Date of Birth: 1955-04-08

## 2018-09-08 ENCOUNTER — Ambulatory Visit
Admission: RE | Admit: 2018-09-08 | Discharge: 2018-09-08 | Disposition: A | Discharge status: Home / Self Care | Payer: Managed Care, Other (non HMO) | Source: Ambulatory Visit | Attending: Radiation Oncology | Admitting: Radiation Oncology

## 2018-09-08 ENCOUNTER — Other Ambulatory Visit: Payer: Self-pay

## 2018-09-08 DIAGNOSIS — C50412 Malignant neoplasm of upper-outer quadrant of left female breast: Secondary | ICD-10-CM | POA: Diagnosis not present

## 2018-09-09 ENCOUNTER — Ambulatory Visit
Admission: RE | Admit: 2018-09-09 | Discharge: 2018-09-09 | Disposition: A | Discharge status: Home / Self Care | Payer: Managed Care, Other (non HMO) | Source: Ambulatory Visit | Attending: Radiation Oncology | Admitting: Radiation Oncology

## 2018-09-09 ENCOUNTER — Other Ambulatory Visit: Payer: Self-pay

## 2018-09-09 ENCOUNTER — Ambulatory Visit: Payer: Managed Care, Other (non HMO)

## 2018-09-09 DIAGNOSIS — I890 Lymphedema, not elsewhere classified: Secondary | ICD-10-CM

## 2018-09-09 DIAGNOSIS — M25612 Stiffness of left shoulder, not elsewhere classified: Secondary | ICD-10-CM

## 2018-09-09 DIAGNOSIS — M6281 Muscle weakness (generalized): Secondary | ICD-10-CM

## 2018-09-09 DIAGNOSIS — M25512 Pain in left shoulder: Secondary | ICD-10-CM

## 2018-09-09 DIAGNOSIS — R293 Abnormal posture: Secondary | ICD-10-CM

## 2018-09-09 DIAGNOSIS — C50412 Malignant neoplasm of upper-outer quadrant of left female breast: Secondary | ICD-10-CM | POA: Diagnosis not present

## 2018-09-09 NOTE — Therapy (Signed)
[place], [place], [address] Phone: [phone]   Fax:  [phone]  Physical Therapy Treatment  Patient Details  Name: Christy Serrano MRN: [id number] Date of Birth: Oct 31, 1955 Referring Provider (PT): Joaquin Courts Date: 09/09/2018  PT End of Session - 09/09/18 0950    Visit Number  6    Number of Visits  9    Date for PT Re-Evaluation  09/21/18    PT Start Time  0902    PT Stop Time  0945    PT Time Calculation (min)  43 min    Activity Tolerance  Patient tolerated treatment well    Behavior During Therapy  [facility] for tasks assessed/performed       Past Medical History:  Diagnosis Date  . Cancer ([place])    left breast  . Chronic bronchitis ([place])   . Family history of breast cancer   . Family history of ovarian cancer   . Family history of prostate cancer   . GERD (gastroesophageal reflux disease)    due to chemotherapy  . Lactose intolerance   . PONV (postoperative nausea and vomiting)     Past Surgical History:  Procedure Laterality Date  . BREAST LUMPECTOMY WITH RADIOACTIVE SEED AND AXILLARY LYMPH NODE DISSECTION Left 07/12/2018   Procedure: LEFT BREAST RADIOACTIVE SEED X 3 LUMPECTOMY X 3  AND AXILLARY LYMPH NODE DISSECTION;  Surgeon: Jovita Kussmaul, MD;  Location: [place];  Service: General;  Laterality: Left;  . PORTACATH PLACEMENT Right 03/03/2018   Procedure: INSERTION PORT-A-CATH;  Surgeon: Jovita Kussmaul, MD;  Location: [place];  Service: General;  Laterality: Right;  . ROTATOR CUFF REPAIR Right 2006  . TOTAL ABDOMINAL HYSTERECTOMY  2010    There were no vitals filed for this visit.  Subjective Assessment - 09/09/18 0904    Subjective  I can raise my Lt arm a little higher than I used to but it's still popping on me. The swelling at the side of my trunk is doing better as well. I think it's from the diuretics the doctor has me on and the chip  pack I've been wearing you guys gave me.    Pertinent History  L breast cancer, left lumpectomy 07/12/18 ALND (11 nodes removed all negative), pt has completed chemo and begins radiation next week    Patient Stated Goals  to get ROM back in my arm and shoulder, decrease swelling in my side    Currently in Pain?  No/denies                  Outpatient Re[facility] from [facility]  Lymphedema Life Impact Scale Total Score  5.88 %           [facility] Adult PT Treatment/Exercise - 09/09/18 0001      Shoulder Exercises: Standing   External Rotation  Strengthening;Both;10 reps;Theraband    Theraband Level (Shoulder External Rotation)  Level 1 (Yellow)    Row  Strengthening;Right;Left;10 reps;Theraband    Theraband Level (Shoulder Row)  Level 1 (Yellow)    Row Limitations  Forearms on wall with elbows at chest level, alternating Rt, then Lt pulling elbow down into scapular retraction    Other Standing Exercises  Lt hand on purple ball on wall slightly below shoulder height for small CW and CCW circles while leaning slightly into ball for joint approximation 20x each way, pt returned therapist demonstration  Shoulder Exercises: Therapy Ball   Flexion  Both;10 reps   forward lean into end of stretch   Flexion Limitations  pt returned therapist demo      Manual Therapy   Edema Management  issued TG soft size medium to see if this will be more comfortable for pt, size small caused tingling in her hand/fingers in the morning    Myofascial Release  to cording from left axilla to wrist using cross hand technique, and especially at antecubital fossa; also Lt UE pulling throughout P/ROM, small pop felt at wrist during this    Manual Lymphatic Drainage (MLD)  In Supine: Short neck, 5 diaphragamtic breaths, Lt inguinal and Rt axillary nodes, Lt axillo-inguinal and anterior inter-axillary anastomosis, then Lt UE working from lateral shoulder to forearm  proximal to distal retracing all steps to help decrease cording.     Passive ROM  to left UE in direction of flexion, abduction and D2 with gentle posterior pressure on anterior shoulder to keep shoulder stabilized and scapular depression    Neural Stretch  To Lt UE during P/ROM                  PT Long Term Goals - 08/24/18 1454      PT LONG TERM GOAL #1   Title  Pt will demonstrate 160 degrees of left shoulder flexion to allow her to reach overhead.    Baseline  125    Time  4    Period  Weeks    Status  New    Target Date  09/21/18      PT LONG TERM GOAL #2   Title  Pt will demonstrate 160 degrees of left shoulder abduction to allow her to reach out to the side    Baseline  80    Time  4    Period  Weeks    Status  New    Target Date  09/21/18      PT LONG TERM GOAL #3   Title  Pt will be independent in a home exercise program for continued strengthening and stretching    Time  4    Period  Weeks    Status  New    Target Date  09/21/18      PT LONG TERM GOAL #4   Title  Pt will report a 50% improvement in swelling in left lateral trunk to allow improved comfort.    Time  4    Period  Weeks    Status  New    Target Date  09/21/18      PT LONG TERM GOAL #5   Title  Pt will report a 75% improvement in pain and tightness in left arm due to cording to allow improved function    Time  4    Period  Weeks    Status  New    Target Date  09/21/18            Plan - 09/09/18 0901    Clinical Impression Statement  Continued with scapular stability exercises today adding small circles on wall with ball, see flowsheet. Pt tolerated all exercises well reporting feeling challenged by these. Also continued with manual therapy trying to further her end ROM and myofascial release to multiple cords in multiple areas. Overall pt repors she is able to lift her arm higher than a few weeks ago, but does still note popping with certain movements. Issued size medium TG soft  for  pt to try wearing to see if this wiil alleviate symptoms of cording to some extent without causing tingling in hand/fingers as she felt before with size small.    Personal Factors and Comorbidities  Other   pt getting radiation   Examination-Activity Limitations  Reach Overhead;Carry    Examination-Participation Restrictions  Cleaning;Meal Prep    Rehab Potential  Good    PT Treatment/Interventions  ADLs/Self Care Home Management;Therapeutic exercise;Patient/family education;Therapeutic activities;Manual techniques;Manual lymph drainage;Scar mobilization;Passive range of motion;Taping;Joint Manipulations    PT Next Visit Plan  Cont MLD of left lateral trunk prn, teach rest of supine scap, myofascial to cording in left antecubital fossa, cont scapualr stability exercises; see if pt brought in sports bra and if it has enough coverage for lateral trunk swelling, begin PROM left shoulder and give dowel exercises    Consulted and Agree with Plan of Care  Patient       Patient will benefit from skilled therapeutic intervention in order to improve the following deficits and impairments:  Pain, Increased fascial restricitons, Decreased knowledge of use of DME, Decreased scar mobility, Postural dysfunction, Decreased range of motion, Decreased strength, Impaired UE functional use, Increased edema  Visit Diagnosis: 1. Stiffness of left shoulder, not elsewhere classified   2. Acute pain of left shoulder   3. Lymphedema, not elsewhere classified   4. Muscle weakness (generalized)   5. Abnormal posture        Problem List Patient Active Problem List   Diagnosis Date Noted  . Carcinoma of upper-outer quadrant of left breast in female, estrogen receptor negative ([place]) 07/12/2018  . Seizure-like activity ([place])   . Hyponatremia   . Recurrent syncope 03/08/2018  . LFT elevation   . Seizure ([place])   . Thrombocytopenia ([place])   . Port-A-Cath in place 03/04/2018  . Genetic testing 02/23/2018  .  Family history of breast cancer   . Family history of prostate cancer   . Family history of ovarian cancer   . Malignant neoplasm of left female breast ([place]) 02/03/2018  . Prediabetes 01/06/2018  . Reactive airway disease without complication 32/20/2542  . Class 1 obesity with serious comorbidity and body mass index (BMI) of 32.0 to 32.9 in adult 12/22/2017    Otelia Limes, PTA 09/09/2018, 9:57 AM  [place], [place], [address] Phone: [phone]   Fax:  [phone]  Name: Christy Serrano MRN: [id number] Date of Birth: [birth year]

## 2018-09-09 NOTE — Progress Notes (Unsigned)
Per Dr. Lindi Adie okay for treatment on 6/19 with no labs.

## 2018-09-10 ENCOUNTER — Other Ambulatory Visit: Payer: Managed Care, Other (non HMO)

## 2018-09-10 ENCOUNTER — Other Ambulatory Visit: Payer: Self-pay

## 2018-09-10 ENCOUNTER — Inpatient Hospital Stay: Payer: Managed Care, Other (non HMO) | Attending: Hematology and Oncology

## 2018-09-10 ENCOUNTER — Ambulatory Visit
Admission: RE | Admit: 2018-09-10 | Discharge: 2018-09-10 | Disposition: A | Discharge status: Home / Self Care | Payer: Managed Care, Other (non HMO) | Source: Ambulatory Visit | Attending: Radiation Oncology | Admitting: Radiation Oncology

## 2018-09-10 VITALS — BP 123/69 | HR 76 | Temp 99.1°F | Resp 18 | Ht 65.0 in | Wt 169.0 lb

## 2018-09-10 DIAGNOSIS — Z171 Estrogen receptor negative status [ER-]: Secondary | ICD-10-CM

## 2018-09-10 DIAGNOSIS — C50412 Malignant neoplasm of upper-outer quadrant of left female breast: Secondary | ICD-10-CM | POA: Diagnosis not present

## 2018-09-10 MED ORDER — ACETAMINOPHEN 325 MG PO TABS
650.0000 mg | ORAL_TABLET | Freq: Once | ORAL | Status: AC
  Administered 2018-09-10: 650 mg via ORAL

## 2018-09-10 MED ORDER — SODIUM CHLORIDE 0.9 % IV SOLN
Freq: Once | INTRAVENOUS | Status: AC
  Administered 2018-09-10: 10:00:00 via INTRAVENOUS
  Filled 2018-09-10: qty 250

## 2018-09-10 MED ORDER — DIPHENHYDRAMINE HCL 25 MG PO CAPS
ORAL_CAPSULE | ORAL | Status: AC
  Filled 2018-09-10: qty 2

## 2018-09-10 MED ORDER — DIPHENHYDRAMINE HCL 25 MG PO CAPS
50.0000 mg | ORAL_CAPSULE | Freq: Once | ORAL | Status: AC
  Administered 2018-09-10: 50 mg via ORAL

## 2018-09-10 MED ORDER — HEPARIN SOD (PORK) LOCK FLUSH 100 UNIT/ML IV SOLN
500.0000 [IU] | Freq: Once | INTRAVENOUS | Status: AC | PRN
  Administered 2018-09-10: 500 [IU]
  Filled 2018-09-10: qty 5

## 2018-09-10 MED ORDER — SODIUM CHLORIDE 0.9 % IV SOLN
420.0000 mg | Freq: Once | INTRAVENOUS | Status: AC
  Administered 2018-09-10: 420 mg via INTRAVENOUS
  Filled 2018-09-10: qty 14

## 2018-09-10 MED ORDER — TRASTUZUMAB CHEMO 150 MG IV SOLR
450.0000 mg | Freq: Once | INTRAVENOUS | Status: AC
  Administered 2018-09-10: 450 mg via INTRAVENOUS
  Filled 2018-09-10: qty 21.43

## 2018-09-10 MED ORDER — ACETAMINOPHEN 325 MG PO TABS
ORAL_TABLET | ORAL | Status: AC
  Filled 2018-09-10: qty 2

## 2018-09-10 MED ORDER — SODIUM CHLORIDE 0.9% FLUSH
10.0000 mL | INTRAVENOUS | Status: DC | PRN
  Administered 2018-09-10: 10 mL
  Filled 2018-09-10: qty 10

## 2018-09-10 NOTE — Patient Instructions (Signed)
[facility] Discharge Instructions for Patients Receiving Chemotherapy  Today you received the following chemotherapy agents :  Herceptin, Perjeta.  To help prevent nausea and vomiting after your treatment, we encourage you to take your nausea medication as prescribed.   If you develop nausea and vomiting that is not controlled by your nausea medication, call the clinic.   BELOW ARE SYMPTOMS THAT SHOULD BE REPORTED IMMEDIATELY:  *FEVER GREATER THAN 100.5 F  *CHILLS WITH OR WITHOUT FEVER  NAUSEA AND VOMITING THAT IS NOT CONTROLLED WITH YOUR NAUSEA MEDICATION  *UNUSUAL SHORTNESS OF BREATH  *UNUSUAL BRUISING OR BLEEDING  TENDERNESS IN MOUTH AND THROAT WITH OR WITHOUT PRESENCE OF ULCERS  *URINARY PROBLEMS  *BOWEL PROBLEMS  UNUSUAL RASH Items with * indicate a potential emergency and should be followed up as soon as possible.  Feel free to call the clinic should you have any questions or concerns. The clinic phone number is [phone].  Please show the CHEMO ALERT CARD at check-in to the Emergency Department and triage nurse.   

## 2018-09-13 ENCOUNTER — Other Ambulatory Visit: Payer: Self-pay

## 2018-09-13 ENCOUNTER — Ambulatory Visit
Admission: RE | Admit: 2018-09-13 | Discharge: 2018-09-13 | Disposition: A | Discharge status: Home / Self Care | Payer: Managed Care, Other (non HMO) | Source: Ambulatory Visit | Attending: Radiation Oncology | Admitting: Radiation Oncology

## 2018-09-13 DIAGNOSIS — C50412 Malignant neoplasm of upper-outer quadrant of left female breast: Secondary | ICD-10-CM | POA: Diagnosis not present

## 2018-09-14 ENCOUNTER — Other Ambulatory Visit: Payer: Self-pay

## 2018-09-14 ENCOUNTER — Telehealth (INDEPENDENT_AMBULATORY_CARE_PROVIDER_SITE_OTHER): Payer: Managed Care, Other (non HMO) | Admitting: Bariatrics

## 2018-09-14 ENCOUNTER — Ambulatory Visit
Admission: RE | Admit: 2018-09-14 | Discharge: 2018-09-14 | Disposition: A | Discharge status: Home / Self Care | Payer: Managed Care, Other (non HMO) | Source: Ambulatory Visit | Attending: Radiation Oncology | Admitting: Radiation Oncology

## 2018-09-14 ENCOUNTER — Ambulatory Visit: Payer: Managed Care, Other (non HMO)

## 2018-09-14 DIAGNOSIS — I890 Lymphedema, not elsewhere classified: Secondary | ICD-10-CM

## 2018-09-14 DIAGNOSIS — R293 Abnormal posture: Secondary | ICD-10-CM

## 2018-09-14 DIAGNOSIS — C50412 Malignant neoplasm of upper-outer quadrant of left female breast: Secondary | ICD-10-CM | POA: Diagnosis not present

## 2018-09-14 DIAGNOSIS — M25512 Pain in left shoulder: Secondary | ICD-10-CM

## 2018-09-14 DIAGNOSIS — M25612 Stiffness of left shoulder, not elsewhere classified: Secondary | ICD-10-CM

## 2018-09-14 DIAGNOSIS — M6281 Muscle weakness (generalized): Secondary | ICD-10-CM

## 2018-09-14 NOTE — Therapy (Signed)
[place], [place], [address] Phone: [phone]   Fax:  [phone]  Physical Therapy Treatment  Patient Details  Name: NALANY STEEDLEY MRN: [id number] Date of Birth: 29-Sep-1955 Referring Provider (PT): Joaquin Courts Date: 09/14/2018  PT End of Session - 09/14/18 0954    Visit Number  7    Number of Visits  9    Date for PT Re-Evaluation  09/21/18    PT Start Time  0906    PT Stop Time  0952    PT Time Calculation (min)  46 min    Activity Tolerance  Patient tolerated treatment well    Behavior During Therapy  [facility] for tasks assessed/performed       Past Medical History:  Diagnosis Date  . Cancer ([place])    left breast  . Chronic bronchitis ([place])   . Family history of breast cancer   . Family history of ovarian cancer   . Family history of prostate cancer   . GERD (gastroesophageal reflux disease)    due to chemotherapy  . Lactose intolerance   . PONV (postoperative nausea and vomiting)     Past Surgical History:  Procedure Laterality Date  . BREAST LUMPECTOMY WITH RADIOACTIVE SEED AND AXILLARY LYMPH NODE DISSECTION Left 07/12/2018   Procedure: LEFT BREAST RADIOACTIVE SEED X 3 LUMPECTOMY X 3  AND AXILLARY LYMPH NODE DISSECTION;  Surgeon: Jovita Kussmaul, MD;  Location: [place];  Service: General;  Laterality: Left;  . PORTACATH PLACEMENT Right 03/03/2018   Procedure: INSERTION PORT-A-CATH;  Surgeon: Jovita Kussmaul, MD;  Location: [place];  Service: General;  Laterality: Right;  . ROTATOR CUFF REPAIR Right 2006  . TOTAL ABDOMINAL HYSTERECTOMY  2010    There were no vitals filed for this visit.  Subjective Assessment - 09/14/18 0909    Subjective  I slept in the sleeve Sunday night and my hand was fine but man was my upper arm achy the next morning. The popping is still about the same and my Lt side feels so tight!    Pertinent History  L breast cancer,  left lumpectomy 07/12/18 ALND (11 nodes removed all negative), pt has completed chemo and begins radiation next week    Patient Stated Goals  to get ROM back in my arm and shoulder, decrease swelling in my side    Currently in Pain?  No/denies         [facility] PT Assessment - 09/14/18 0001      AROM   Left Shoulder Flexion  149 Degrees    Left Shoulder ABduction  131 Degrees              Outpatient Rehab from [facility]/24/2018 [facility]  Lymphedema Life Impact Scale Total Score  5.88 %           [facility] Adult PT Treatment/Exercise - 09/14/18 0001      Shoulder Exercises: Pulleys   Flexion  2 minutes    Flexion Limitations  VCs and demo for correct technique    ABduction  2 minutes    ABduction Limitations  Pt returned correct technique      Shoulder Exercises: Therapy Ball   Flexion  Both;10 reps   Forward lean into end of stretch   ABduction  Left;5 reps   same side lean into end of stretch   ABduction Limitations  pt reports feeling good pull into side where  feeling very tight.       Manual Therapy   Manual Therapy  Myofascial release;Scapular mobilization;Manual Lymphatic Drainage (MLD);Passive ROM;Neural Stretch;Taping    Myofascial Release  to cording from left axilla to wrist using cross hand technique, and especially at antecubital fossa; also Lt UE pulling throughout P/ROM,    Scapular Mobilization  In Rt S/L into protraction and retraction with Lt UE abduction and flexion    Manual Lymphatic Drainage (MLD)  In Supine: Short neck, 5 diaphragamtic breaths, Lt inguinal and Rt axillary nodes, Lt axillo-inguinal and anterior inter-axillary anastomosis, then Lt UE working from lateral shoulder to forearm proximal to distal retracing all steps to help decrease cording.     Passive ROM  to left UE in direction of flexion, abduction and D2 with gentle posterior pressure on anterior shoulder to keep shoulder stabilized and scapular depression     Neural Stretch  To Lt UE during P/ROM    Kinesiotex  Edema      Kinesiotix   Edema  "I" band placed along Lt axillo-inguinal anastomosis below radiation field                  PT Long Term Goals - 08/24/18 1454      PT LONG TERM GOAL #1   Title  Pt will demonstrate 160 degrees of left shoulder flexion to allow her to reach overhead.    Baseline  125    Time  4    Period  Weeks    Status  New    Target Date  09/21/18      PT LONG TERM GOAL #2   Title  Pt will demonstrate 160 degrees of left shoulder abduction to allow her to reach out to the side    Baseline  80    Time  4    Period  Weeks    Status  New    Target Date  09/21/18      PT LONG TERM GOAL #3   Title  Pt will be independent in a home exercise program for continued strengthening and stretching    Time  4    Period  Weeks    Status  New    Target Date  09/21/18      PT LONG TERM GOAL #4   Title  Pt will report a 50% improvement in swelling in left lateral trunk to allow improved comfort.    Time  4    Period  Weeks    Status  New    Target Date  09/21/18      PT LONG TERM GOAL #5   Title  Pt will report a 75% improvement in pain and tightness in left arm due to cording to allow improved function    Time  4    Period  Weeks    Status  New    Target Date  09/21/18            Plan - 09/14/18 0956    Clinical Impression Statement  At end of session of AA/ROM and manual therapy remeaseured pts A/ROM. this has improved well since last time measured. Pt does still present with cording that is very tight and limiting but she is noticing improvements with reaching with ADLs. Trial of kinesiotape today along Lt axillo-inguinal anastomosis below radiation field.    Personal Factors and Comorbidities  Other   pt getting radiation   Examination-Activity Limitations  Reach Overhead;Carry    Examination-Participation Restrictions  Cleaning;Meal Prep    Rehab Potential  Good    PT Frequency  2x /  week    PT Duration  4 weeks    PT Treatment/Interventions  ADLs/Self Care Home Management;Therapeutic exercise;Patient/family education;Therapeutic activities;Manual techniques;Manual lymph drainage;Scar mobilization;Passive range of motion;Taping;Joint Manipulations    PT Next Visit Plan  Cont manual therapy of left UE, especially focusing on decreasing cording and improving end ROM, teach rest of supine scap, cont scapular stability exercises    Consulted and Agree with Plan of Care  Patient       Patient will benefit from skilled therapeutic intervention in order to improve the following deficits and impairments:  Pain, Increased fascial restricitons, Decreased knowledge of use of DME, Decreased scar mobility, Postural dysfunction, Decreased range of motion, Decreased strength, Impaired UE functional use, Increased edema  Visit Diagnosis: 1. Stiffness of left shoulder, not elsewhere classified   2. Acute pain of left shoulder   3. Lymphedema, not elsewhere classified   4. Muscle weakness (generalized)   5. Abnormal posture        Problem List Patient Active Problem List   Diagnosis Date Noted  . Carcinoma of upper-outer quadrant of left breast in female, estrogen receptor negative ([place]) 07/12/2018  . Seizure-like activity ([place])   . Hyponatremia   . Recurrent syncope 03/08/2018  . LFT elevation   . Seizure ([place])   . Thrombocytopenia ([place])   . Port-A-Cath in place 03/04/2018  . Genetic testing 02/23/2018  . Family history of breast cancer   . Family history of prostate cancer   . Family history of ovarian cancer   . Malignant neoplasm of left female breast ([place]) 02/03/2018  . Prediabetes 01/06/2018  . Reactive airway disease without complication 33/54/5625  . Class 1 obesity with serious comorbidity and body mass index (BMI) of 32.0 to 32.9 in adult 12/22/2017    Otelia Limes, PTA 09/14/2018, 10:02 AM  [place], [place], [address] Phone: [phone]   Fax:  [phone]  Name: LESLE FARON MRN: [id number] Date of Birth: Oct 13, 1955

## 2018-09-15 ENCOUNTER — Ambulatory Visit
Admission: RE | Admit: 2018-09-15 | Discharge: 2018-09-15 | Disposition: A | Discharge status: Home / Self Care | Payer: Managed Care, Other (non HMO) | Source: Ambulatory Visit | Attending: Radiation Oncology | Admitting: Radiation Oncology

## 2018-09-15 ENCOUNTER — Other Ambulatory Visit: Payer: Self-pay

## 2018-09-15 DIAGNOSIS — C50412 Malignant neoplasm of upper-outer quadrant of left female breast: Secondary | ICD-10-CM | POA: Diagnosis not present

## 2018-09-16 ENCOUNTER — Ambulatory Visit: Payer: Managed Care, Other (non HMO)

## 2018-09-16 ENCOUNTER — Other Ambulatory Visit: Payer: Self-pay

## 2018-09-16 ENCOUNTER — Ambulatory Visit
Admission: RE | Admit: 2018-09-16 | Discharge: 2018-09-16 | Disposition: A | Discharge status: Home / Self Care | Payer: Managed Care, Other (non HMO) | Source: Ambulatory Visit | Attending: Radiation Oncology | Admitting: Radiation Oncology

## 2018-09-16 DIAGNOSIS — I890 Lymphedema, not elsewhere classified: Secondary | ICD-10-CM

## 2018-09-16 DIAGNOSIS — R293 Abnormal posture: Secondary | ICD-10-CM

## 2018-09-16 DIAGNOSIS — M25612 Stiffness of left shoulder, not elsewhere classified: Secondary | ICD-10-CM

## 2018-09-16 DIAGNOSIS — C50412 Malignant neoplasm of upper-outer quadrant of left female breast: Secondary | ICD-10-CM | POA: Diagnosis not present

## 2018-09-16 DIAGNOSIS — M25512 Pain in left shoulder: Secondary | ICD-10-CM

## 2018-09-16 DIAGNOSIS — M6281 Muscle weakness (generalized): Secondary | ICD-10-CM

## 2018-09-16 NOTE — Therapy (Signed)
[place], [place], [address] Phone: [phone]   Fax:  [phone]  Physical Therapy Treatment  Patient Details  Name: Christy Serrano MRN: [id number] Date of Birth: [birth year] Referring Provider (PT): Joaquin Courts Date: 09/16/2018  PT End of Session - 09/16/18 0950    Visit Number  8    Number of Visits  9    Date for PT Re-Evaluation  09/21/18    PT Start Time  0902    PT Stop Time  0950    PT Time Calculation (min)  48 min    Activity Tolerance  Patient tolerated treatment well    Behavior During Therapy  [facility] for tasks assessed/performed       Past Medical History:  Diagnosis Date  . Cancer ([place])    left breast  . Chronic bronchitis ([place])   . Family history of breast cancer   . Family history of ovarian cancer   . Family history of prostate cancer   . GERD (gastroesophageal reflux disease)    due to chemotherapy  . Lactose intolerance   . PONV (postoperative nausea and vomiting)     Past Surgical History:  Procedure Laterality Date  . BREAST LUMPECTOMY WITH RADIOACTIVE SEED AND AXILLARY LYMPH NODE DISSECTION Left 07/12/2018   Procedure: LEFT BREAST RADIOACTIVE SEED X 3 LUMPECTOMY X 3  AND AXILLARY LYMPH NODE DISSECTION;  Surgeon: Jovita Kussmaul, MD;  Location: [place];  Service: General;  Laterality: Left;  . PORTACATH PLACEMENT Right 03/03/2018   Procedure: INSERTION PORT-A-CATH;  Surgeon: Jovita Kussmaul, MD;  Location: [place];  Service: General;  Laterality: Right;  . ROTATOR CUFF REPAIR Right 2006  . TOTAL ABDOMINAL HYSTERECTOMY  2010    There were no vitals filed for this visit.  Subjective Assessment - 09/16/18 0905    Subjective  I wore the smaller sleeve last night but left it off my hand and it actually felt good this morning whe I woke up, I didn't have th achiness from the first 2 trials.    Pertinent History  L breast cancer, left  lumpectomy 07/12/18 ALND (11 nodes removed all negative), pt has completed chemo and begins radiation next week    Patient Stated Goals  to get ROM back in my arm and shoulder, decrease swelling in my side    Currently in Pain?  No/denies                  Outpatient Re[facility]  Lymphedema Life Impact Scale Total Score  5.88 %           [facility] Adult PT Treatment/Exercise - 09/16/18 0001      Shoulder Exercises: Supine   External Rotation  Strengthening;Both;10 reps;Theraband    Theraband Level (Shoulder External Rotation)  Level 2 (Red)    Flexion  Strengthening;Both;5 reps;Theraband   Wide grip as she is already doing narrow   Theraband Level (Shoulder Flexion)  Level 2 (Red)    Flexion Limitations  "popping" with this so instructed pt to try limited range at home and yellow band    Diagonals  Strengthening;Right;Left;5 reps;Theraband    Theraband Level (Shoulder Diagonals)  Level 2 (Red)      Shoulder Exercises: Standing   Other Standing Exercises  Bil Ue 3 way raises with 1 lb for flexion and scaption, no weigft for abduction x10 each with back against wall  returning therapist demo      Shoulder Exercises: Pulleys   Flexion  2 minutes    Flexion Limitations  VCs for slower pace    ABduction  2 minutes      Shoulder Exercises: Therapy Ball   Flexion  Both;10 reps   forward lean into end of stretch   ABduction  Left;5 reps   same side lean into end of stretch     Manual Therapy   Myofascial Release  to cording from left axilla to wrist using cross hand technique, and especially at antecubital fossa; also Lt UE pulling throughout P/ROM,    Passive ROM  to left UE in direction of flexion, abduction and D2 with gentle posterior pressure on anterior shoulder to keep shoulder stabilized and scapular depression    Neural Stretch  To Lt UE during P/ROM                  PT Long Term Goals - 09/16/18  0915      PT LONG TERM GOAL #3   Title  Pt will be independent in a home exercise program for continued strengthening and stretching    Baseline  Pt is independent in initial HEP and this was progressed today-09/16/18    Status  On-going      PT LONG TERM GOAL #4   Title  Pt will report a 50% improvement in swelling in left lateral trunk to allow improved comfort.    Baseline  50% improvement at this time-09/16/18    Status  Achieved      PT LONG TERM GOAL #5   Title  Pt will report a 75% improvement in pain and tightness in left arm due to cording to allow improved function    Baseline  90% at this time-09/16/18    Status  Achieved            Plan - 09/16/18 0903    Clinical Impression Statement  Christy Serrano is progressing very well overall towards all goals and is probable for D/C next session. Progressed HEP to include standing bil UE 3 way raises and completed instruction of supine scapular series which she tolerated well, though did have some "popping" with Wide grip flexion so limited ROM and she is to do this with yellow theraband. Though pts end A/ and P/ROM is still limited from fascial tightness and presence of cording, her motion has improved since start of care.    Personal Factors and Comorbidities  Other   getting radiation   Examination-Activity Limitations  Reach Overhead;Carry    Examination-Participation Restrictions  Cleaning;Meal Prep    Rehab Potential  Good    PT Duration  4 weeks    PT Treatment/Interventions  ADLs/Self Care Home Management;Therapeutic exercise;Patient/family education;Therapeutic activities;Manual techniques;Manual lymph drainage;Scar mobilization;Passive range of motion;Taping;Joint Manipulations    PT Next Visit Plan  Probable D/C next session. Review HEP prn and reassess goals.    Consulted and Agree with Plan of Care  Patient       Patient will benefit from skilled therapeutic intervention in order to improve the following deficits and  impairments:  Pain, Increased fascial restricitons, Decreased knowledge of use of DME, Decreased scar mobility, Postural dysfunction, Decreased range of motion, Decreased strength, Impaired UE functional use, Increased edema  Visit Diagnosis: 1. Stiffness of left shoulder, not elsewhere classified   2. Acute pain of left shoulder   3. Lymphedema, not elsewhere classified   4. Muscle weakness (generalized)  5. Abnormal posture        Problem List Patient Active Problem List   Diagnosis Date Noted  . Carcinoma of upper-outer quadrant of left breast in female, estrogen receptor negative ([place]) 07/12/2018  . Seizure-like activity ([place])   . Hyponatremia   . Recurrent syncope 03/08/2018  . LFT elevation   . Seizure ([place])   . Thrombocytopenia ([place])   . Port-A-Cath in place 03/04/2018  . Genetic testing 02/23/2018  . Family history of breast cancer   . Family history of prostate cancer   . Family history of ovarian cancer   . Malignant neoplasm of left female breast ([place]) 02/03/2018  . Prediabetes 01/06/2018  . Reactive airway disease without complication 53/29/9242  . Class 1 obesity with serious comorbidity and body mass index (BMI) of 32.0 to 32.9 in adult 12/22/2017    Otelia Limes, PTA 09/16/2018, 10:04 AM  [place], [place], [address] Phone: [phone]   Fax:  [phone]  Name: Christy Serrano MRN: [id number] Date of Birth: Sep 18, 1955

## 2018-09-16 NOTE — Patient Instructions (Signed)
3 Way Raises:      Starting Position:  Leaning against wall, walk feet a few inches away from the wall and make tummy tight (tuck hips underneath you) Press back/shoulders/head against wall as much as possible. Keep thumbs up to ceiling, elbows straight and shoulders relaxed/down throughout.  1. Lift arms in front to shoulder height 2. Lift arms a little wider into a "V" to shoulder height 3. Lift arms out to sides in a "T" to shoulder height  Perform 10 times in each direction. Hold 1-2 lbs to start with and work up to 2-3 sets of 10/day. Perform 3-4 times/week. Increase weight as able, decreasing sets of 10 each time you increase weights, then slowly working your way back up to 2-3 sets each time.    Scapular series  Over Head Pull: Narrow and Wide Grip     On back, knees bent, feet flat, band across thighs, elbows straight but relaxed. Pull hands apart (start). Keeping elbows straight, bring arms up and over head, hands toward floor. Keep pull steady on band. Hold momentarily. Return slowly, keeping pull steady, back to start. Then do same with a wider grip on the band (past shoulder width) Repeat _5-10__ times. Band color __yellow or red____   Side Pull: Double Arm   On back, knees bent, feet flat. Arms perpendicular to body, shoulder level, elbows straight but relaxed. Pull arms out to sides, elbows straight. Resistance band comes across collarbones, hands toward floor. Hold momentarily. Slowly return to starting position. Repeat _5-10__ times. Band color _yellow or red____   Sword   On back, knees bent, feet flat, left hand on left hip, right hand above left. Pull right arm DIAGONALLY (hip to shoulder) across chest. Bring right arm along head toward floor. Hold momentarily. Slowly return to starting position. Repeat _5-10__ times. Do with left arm. Band color _yellow or red_____   Shoulder Rotation: Double Arm   On back, knees bent, feet flat, elbows tucked at sides,  bent 90, hands palms up. Pull hands apart and down toward floor, keeping elbows near sides. Hold momentarily. Slowly return to starting position. Repeat _5-10__ times. Band color __yellow or red____    Cancer Rehab [phone]

## 2018-09-17 ENCOUNTER — Encounter: Payer: Self-pay | Admitting: Pharmacy Technician

## 2018-09-17 ENCOUNTER — Other Ambulatory Visit: Payer: Self-pay

## 2018-09-17 ENCOUNTER — Ambulatory Visit
Admission: RE | Admit: 2018-09-17 | Discharge: 2018-09-17 | Disposition: A | Discharge status: Home / Self Care | Payer: Managed Care, Other (non HMO) | Source: Ambulatory Visit | Attending: Radiation Oncology | Admitting: Radiation Oncology

## 2018-09-17 DIAGNOSIS — C50412 Malignant neoplasm of upper-outer quadrant of left female breast: Secondary | ICD-10-CM | POA: Diagnosis not present

## 2018-09-17 NOTE — Progress Notes (Signed)
The patient is approved for drug assistance by [place] for Herceptin and Perjeta.  Enrollment is effective until 09/13/19 and is based on self pay. Drug replacement will begin on DOS 08/20/18.

## 2018-09-20 ENCOUNTER — Ambulatory Visit
Admission: RE | Admit: 2018-09-20 | Discharge: 2018-09-20 | Disposition: A | Discharge status: Home / Self Care | Payer: Managed Care, Other (non HMO) | Source: Ambulatory Visit | Attending: Radiation Oncology | Admitting: Radiation Oncology

## 2018-09-20 ENCOUNTER — Other Ambulatory Visit: Payer: Self-pay | Admitting: Radiation Oncology

## 2018-09-20 ENCOUNTER — Other Ambulatory Visit: Payer: Self-pay

## 2018-09-20 DIAGNOSIS — C50412 Malignant neoplasm of upper-outer quadrant of left female breast: Secondary | ICD-10-CM | POA: Diagnosis not present

## 2018-09-20 DIAGNOSIS — R29898 Other symptoms and signs involving the musculoskeletal system: Secondary | ICD-10-CM

## 2018-09-21 ENCOUNTER — Encounter (INDEPENDENT_AMBULATORY_CARE_PROVIDER_SITE_OTHER): Payer: Self-pay | Admitting: Bariatrics

## 2018-09-21 ENCOUNTER — Ambulatory Visit
Admission: RE | Admit: 2018-09-21 | Discharge: 2018-09-21 | Disposition: A | Discharge status: Home / Self Care | Payer: Managed Care, Other (non HMO) | Source: Ambulatory Visit | Attending: Radiation Oncology | Admitting: Radiation Oncology

## 2018-09-21 ENCOUNTER — Ambulatory Visit: Payer: Managed Care, Other (non HMO)

## 2018-09-21 ENCOUNTER — Other Ambulatory Visit: Payer: Self-pay

## 2018-09-21 ENCOUNTER — Telehealth: Payer: Self-pay | Admitting: *Deleted

## 2018-09-21 ENCOUNTER — Telehealth (INDEPENDENT_AMBULATORY_CARE_PROVIDER_SITE_OTHER): Payer: Self-pay | Admitting: Bariatrics

## 2018-09-21 DIAGNOSIS — R293 Abnormal posture: Secondary | ICD-10-CM

## 2018-09-21 DIAGNOSIS — C50412 Malignant neoplasm of upper-outer quadrant of left female breast: Secondary | ICD-10-CM | POA: Diagnosis not present

## 2018-09-21 DIAGNOSIS — I890 Lymphedema, not elsewhere classified: Secondary | ICD-10-CM

## 2018-09-21 DIAGNOSIS — Z6832 Body mass index (BMI) 32.0-32.9, adult: Secondary | ICD-10-CM

## 2018-09-21 DIAGNOSIS — E6609 Other obesity due to excess calories: Secondary | ICD-10-CM

## 2018-09-21 DIAGNOSIS — M25512 Pain in left shoulder: Secondary | ICD-10-CM

## 2018-09-21 DIAGNOSIS — M6281 Muscle weakness (generalized): Secondary | ICD-10-CM

## 2018-09-21 DIAGNOSIS — M25612 Stiffness of left shoulder, not elsewhere classified: Secondary | ICD-10-CM

## 2018-09-21 DIAGNOSIS — R7303 Prediabetes: Secondary | ICD-10-CM

## 2018-09-21 NOTE — Therapy (Addendum)
[place], [place], [address] Phone: [phone]   Fax:  [phone]  Physical Therapy Treatment  Patient Details  Name: Christy Serrano MRN: [id number] Date of Birth: 06-24-1955 Referring Provider (PT): Joaquin Courts Date: 09/21/2018  PT End of Session - 09/21/18 0956    Visit Number  9    Number of Visits  9    Date for PT Re-Evaluation  09/21/18    PT Start Time  0909    PT Stop Time  0950    PT Time Calculation (min)  41 min    Activity Tolerance  Patient tolerated treatment well    Behavior During Therapy  [facility] for tasks assessed/performed       Past Medical History:  Diagnosis Date  . Cancer ([place])    left breast  . Chronic bronchitis ([place])   . Family history of breast cancer   . Family history of ovarian cancer   . Family history of prostate cancer   . GERD (gastroesophageal reflux disease)    due to chemotherapy  . Lactose intolerance   . PONV (postoperative nausea and vomiting)     Past Surgical History:  Procedure Laterality Date  . BREAST LUMPECTOMY WITH RADIOACTIVE SEED AND AXILLARY LYMPH NODE DISSECTION Left 07/12/2018   Procedure: LEFT BREAST RADIOACTIVE SEED X 3 LUMPECTOMY X 3  AND AXILLARY LYMPH NODE DISSECTION;  Surgeon: Jovita Kussmaul, MD;  Location: [place];  Service: General;  Laterality: Left;  . PORTACATH PLACEMENT Right 03/03/2018   Procedure: INSERTION PORT-A-CATH;  Surgeon: Jovita Kussmaul, MD;  Location: [place];  Service: General;  Laterality: Right;  . ROTATOR CUFF REPAIR Right 2006  . TOTAL ABDOMINAL HYSTERECTOMY  2010    There were no vitals filed for this visit.  Subjective Assessment - 09/21/18 0912    Subjective  I've started noticing over the weekend that my side has been feeling really firm and tight. Also started developing a rash on my skin at my chest since yesterday. But my Lt shoulder ROM is still doing well.    Pertinent History  Lt breast cancer, left lumpectomy 07/12/18 ALND (11 nodes removed all negative), pt has completed chemo and begins radiation next week    Patient Stated Goals  to get ROM back in my arm and shoulder, decrease swelling in my side    Currently in Pain?  No/denies         [facility] PT Assessment - 09/21/18 0001      AROM   Left Shoulder Flexion  152 Degrees    Left Shoulder ABduction  145 Degrees              Outpatient Re[facility] from [facility]24/[facility]  Lymphedema Life Impact Scale Total Score  5.88 %           [facility] Adult PT Treatment/Exercise - 09/21/18 0001      Shoulder Exercises: Pulleys   Flexion  2 minutes    ABduction  2 minutes      Shoulder Exercises: Therapy Ball   Flexion  Both;10 reps   forwad lean into end of stretch   ABduction  Left;10 reps      Manual Therapy   Myofascial Release  to cording from left axilla to wrist using cross hand technique, and especially at antecubital fossa; also Lt UE pulling throughout P/ROM,    Passive ROM  to left UE  in direction of flexion, abduction and D2 with gentle posterior pressure on anterior shoulder to keep shoulder stabilized and scapular depression    Neural Stretch  To Lt UE during P/ROM                  PT Long Term Goals - 09/21/18 0917      PT LONG TERM GOAL #1   Title  Pt will demonstrate 160 degrees of left shoulder flexion to allow her to reach overhead.    Baseline  125; 152 degrees - 09/21/18    Status  Partially Met      PT LONG TERM GOAL #2   Title  Pt will demonstrate 160 degrees of left shoulder abduction to allow her to reach out to the side    Baseline  80; 145 degrees - 09/21/18    Status  Partially Met      PT LONG TERM GOAL #3   Title  Pt will be independent in a home exercise program for continued strengthening and stretching    Baseline  Pt is independent in initial HEP and this was progressed today-09/16/18; pt now  independent with HEP-09/21/18    Status  Achieved      PT LONG TERM GOAL #4   Title  Pt will report a 50% improvement in swelling in left lateral trunk to allow improved comfort.    Baseline  50% improvement at this time-09/16/18; still about 50% though pt does notice radiation affecting this some just since yesterday-09/21/18    Status  Achieved      PT LONG TERM GOAL #5   Title  Pt will report a 75% improvement in pain and tightness in left arm due to cording to allow improved function    Baseline  90% at this time-09/16/18    Status  Achieved            Plan - 09/21/18 0957    Clinical Impression Statement  Christy Serrano has made excellent gains overall with therapy meeting 3 out of 5 goals. She has not met her A/ROM goals yet but has made great improvement towards them. She is currently undergoing radiation and starting to notice skin changes. Pt and therapist agree it will be best for pt to be placed on hold until after finishing radiation and giving her skin time to heal so that we may resume PT to help pt attain further end ROM.    Personal Factors and Comorbidities  Other   getting radiation   Examination-Activity Limitations  Reach Overhead;Carry    Examination-Participation Restrictions  Cleaning;Meal Prep    Clinical Decision Making  Moderate    Rehab Potential  Good    PT Frequency  2x / week    PT Duration  4 weeks    PT Treatment/Interventions  ADLs/Self Care Home Management;Therapeutic exercise;Patient/family education;Therapeutic activities;Manual techniques;Manual lymph drainage;Scar mobilization;Passive range of motion;Taping;Joint Manipulations    PT Next Visit Plan  Pt placed on hold until after completing radiation end of July and hopes to resume PT, if needed, to further progress her end ROM. Will need renewal at that time.    Consulted and Agree with Plan of Care  Patient       Patient will benefit from skilled therapeutic intervention in order to improve the  following deficits and impairments:  Pain, Increased fascial restricitons, Decreased knowledge of use of DME, Decreased scar mobility, Postural dysfunction, Decreased range of motion, Decreased strength, Impaired UE functional use, Increased edema    Visit Diagnosis: 1. Stiffness of left shoulder, not elsewhere classified   2. Acute pain of left shoulder   3. Lymphedema, not elsewhere classified   4. Muscle weakness (generalized)   5. Abnormal posture        Problem List Patient Active Problem List   Diagnosis Date Noted  . Carcinoma of upper-outer quadrant of left breast in female, estrogen receptor negative ([place]) 07/12/2018  . Seizure-like activity ([place])   . Hyponatremia   . Recurrent syncope 03/08/2018  . LFT elevation   . Seizure ([place])   . Thrombocytopenia ([place])   . Port-A-Cath in place 03/04/2018  . Genetic testing 02/23/2018  . Family history of breast cancer   . Family history of prostate cancer   . Family history of ovarian cancer   . Malignant neoplasm of left female breast ([place]) 02/03/2018  . Prediabetes 01/06/2018  . Reactive airway disease without complication 36/64/4034  . Class 1 obesity with serious comorbidity and body mass index (BMI) of 32.0 to 32.9 in adult 12/22/2017    Otelia Limes, PTA 09/21/2018, 10:02 AM  [place], [place], [address] Phone: [phone]   Fax:  [phone]  Name: Christy Serrano MRN: [id number] Date of Birth: February 06, 1956  PHYSICAL THERAPY DISCHARGE SUMMARY  Visits from Start of Care: 9  Current functional level related to goals / functional outcomes: See above for objective findings at last visit. Spoke with pt on 11/22/2018 and she reported she had seen Dr. Marlou Sa (ortho) last week and got a cortisone injection. She said since then, her shoulder pain is better and she does not feel limited functionally. She said her shoulder continues to pop  but that it is not painful.   Remaining deficits: Popping in shoulder   Education / Equipment: Encouraged pt to contact us if her shoulder limitations return. Given HEP. Plan: Patient agrees to discharge.  Patient goals were partially met. Patient is being discharged due to being pleased with the current functional level.  ?????         Annia Friendly, [place] 11/22/18 1:16 PM

## 2018-09-21 NOTE — Telephone Encounter (Signed)
Patient had PT appt. on 09-21-18 @ 9 am @ [facility]

## 2018-09-22 ENCOUNTER — Ambulatory Visit
Admission: RE | Admit: 2018-09-22 | Discharge: 2018-09-22 | Disposition: A | Discharge status: Home / Self Care | Payer: 59 | Source: Ambulatory Visit | Attending: Radiation Oncology | Admitting: Radiation Oncology

## 2018-09-22 ENCOUNTER — Other Ambulatory Visit: Payer: Self-pay

## 2018-09-22 DIAGNOSIS — C50412 Malignant neoplasm of upper-outer quadrant of left female breast: Secondary | ICD-10-CM | POA: Diagnosis present

## 2018-09-22 DIAGNOSIS — Z5112 Encounter for antineoplastic immunotherapy: Secondary | ICD-10-CM | POA: Diagnosis present

## 2018-09-22 DIAGNOSIS — Z79899 Other long term (current) drug therapy: Secondary | ICD-10-CM | POA: Diagnosis not present

## 2018-09-22 DIAGNOSIS — Z510 Encounter for antineoplastic radiation therapy: Secondary | ICD-10-CM | POA: Insufficient documentation

## 2018-09-22 DIAGNOSIS — Z171 Estrogen receptor negative status [ER-]: Secondary | ICD-10-CM | POA: Insufficient documentation

## 2018-09-22 NOTE — Progress Notes (Signed)
Office: [phone]  /  Fax: [phone] TeleHealth Visit:  Christy Serrano has verbally consented to this TeleHealth visit today. The patient is located at home, the provider is located at the [employer] [facility] office. The participants in this visit include the listed provider and patient and any and all parties involved. The visit was conducted today via WebEx.  HPI:   Chief Complaint: OBESITY Christy Serrano is here to discuss her progress with her obesity treatment plan. She is on the Category 3 plan and is following her eating plan approximately 25 % of the time. She states she is walking 20 minutes 5 times per week. Christy Serrano is down 7 pounds and she is doing well overall. She is now on a diuretic and she may have lost some water weight. She states that her taste buds have improved. Christy Serrano is getting adequate water. We were unable to weigh the patient today for this TeleHealth visit. She feels as if she has lost weight since her last visit. She has lost 31 lbs since starting treatment with [place].  Pre-Diabetes Christy Serrano has a diagnosis of prediabetes based on her elevated Hgb A1c and was informed this puts her at greater risk of developing diabetes. She is not on medications currently and continues to work on diet and exercise to decrease risk of diabetes. She denies nausea or hypoglycemia.  ASSESSMENT AND PLAN:  Prediabetes  Class 1 obesity due to excess calories with serious comorbidity and body mass index (BMI) of 32.0 to 32.9 in adult  PLAN:  Pre-Diabetes Christy Serrano will continue to work on weight loss, increasing activity, increasing lean protein and decreasing simple carbohydrates in her diet to help decrease the risk of diabetes. She was informed that eating too many simple carbohydrates or too many calories at one sitting increases the likelihood of GI side effects. Christy Serrano will follow up with [place] as directed to monitor her progress.  Obesity Christy Serrano is currently in the  action stage of change. As such, her goal is to continue with weight loss efforts She has agreed to follow the Category 3 plan Christy Serrano will continue to walk for weight loss and overall health benefits. We discussed the following Behavioral Modification Strategies today: increase H2O intake, no skipping meals, keeping healthy foods in the home,  increasing lean protein intake, decreasing simple carbohydrates, increasing vegetables, decrease eating out, work on meal planning and easy cooking plans, dealing with family or coworker sabotage, holiday eating strategies and celebration eating strategies Christy Serrano will weigh herself at home and record.  Christy Serrano has agreed to follow up with our clinic in 4 weeks. She was informed of the importance of frequent follow up visits to maximize her success with intensive lifestyle modifications for her multiple health conditions.  ALLERGIES: Allergies  Allergen Reactions  . Oxycodone-Acetaminophen Other (See Comments) and Palpitations    tachycardia   . Gabapentin Swelling    Lower extremity edema  . Amoxicillin Rash    DID THE REACTION INVOLVE: Swelling of the face/tongue/throat, SOB, or low BP? No Sudden or severe rash/hives, skin peeling, or the inside of the mouth or nose? No Did it require medical treatment? No When did it last happen?in her 50's If all above answers are "NO", may proceed with cephalosporin use.     MEDICATIONS: Current Outpatient Medications on File Prior to Visit  Medication Sig Dispense Refill  . acetaminophen (TYLENOL) 500 MG tablet Take 1,000 mg by mouth every 6 (six) hours as needed for mild pain.    Marland Kitchen  albuterol (PROVENTIL HFA;VENTOLIN HFA) 108 (90 Base) MCG/ACT inhaler Inhale into the lungs every 6 (six) hours as needed for wheezing or shortness of breath.    . budesonide-formoterol (SYMBICORT) 160-4.5 MCG/ACT inhaler Inhale 2 puffs into the lungs 2 (two) times daily.    . bumetanide (BUMEX) 1 MG tablet Take 1  tablet (1 mg total) by mouth 2 (two) times daily. 60 tablet 0  . Calcium-Phosphorus-Vitamin D (CITRACAL +D3 PO) Take 1,200 mcg by mouth.    . cholecalciferol (VITAMIN D) 1000 units tablet Take 1,000 Units by mouth daily.    Marland Kitchen estradiol (VIVELLE-DOT) 0.05 MG/24HR patch     . gabapentin (NEURONTIN) 100 MG capsule Take 1 capsule (100 mg total) by mouth daily. 90 capsule 3  . HYDROcodone-acetaminophen (NORCO/VICODIN) 5-325 MG tablet Take 1 tablet by mouth every 6 (six) hours as needed for moderate pain or severe pain. 30 tablet 0  . Multiple Vitamin (MULTIVITAMIN) capsule Take 1 capsule by mouth daily.    . potassium chloride SA (K-DUR) 20 MEQ tablet Take 1 tablet (20 mEq total) by mouth daily. 90 tablet 1  . spironolactone (ALDACTONE) 25 MG tablet Take 0.5 tablets (12.5 mg total) by mouth daily. 45 tablet 3   No current facility-administered medications on file prior to visit.     PAST MEDICAL HISTORY: Past Medical History:  Diagnosis Date  . Cancer ([place])    left breast  . Chronic bronchitis ([place])   . Family history of breast cancer   . Family history of ovarian cancer   . Family history of prostate cancer   . GERD (gastroesophageal reflux disease)    due to chemotherapy  . Lactose intolerance   . PONV (postoperative nausea and vomiting)     PAST SURGICAL HISTORY: Past Surgical History:  Procedure Laterality Date  . BREAST LUMPECTOMY WITH RADIOACTIVE SEED AND AXILLARY LYMPH NODE DISSECTION Left 07/12/2018   Procedure: LEFT BREAST RADIOACTIVE SEED X 3 LUMPECTOMY X 3  AND AXILLARY LYMPH NODE DISSECTION;  Surgeon: Jovita Kussmaul, MD;  Location: [facility];  Service: General;  Laterality: Left;  . PORTACATH PLACEMENT Right 03/03/2018   Procedure: INSERTION PORT-A-CATH;  Surgeon: Jovita Kussmaul, MD;  Location: [facility];  Service: General;  Laterality: Right;  . ROTATOR CUFF REPAIR Right 2006  . TOTAL ABDOMINAL HYSTERECTOMY  2010    SOCIAL HISTORY: Social  History   Tobacco Use  . Smoking status: Never Smoker  . Smokeless tobacco: Never Used  Substance Use Topics  . Alcohol use: No    Comment: quit long ago  . Drug use: No    FAMILY HISTORY: Family History  Problem Relation Age of Onset  . High blood pressure Mother   . Heart attack Mother        CHF  . High blood pressure Father   . Prostate cancer Father        dx mid 42s  . Breast cancer Sister 43       negative [employer] 46 gene panel  . Breast cancer Paternal Aunt        dx mid 79s  . Cancer Paternal Uncle        jaw cancer d. 34s  . Ovarian cancer Maternal Grandmother        d. 44s  . Cancer Paternal Grandmother        either stomach or ovarian cancer  . Breast cancer Sister 67       "negative 21 gene panel"  ROS: Review of Systems  Constitutional: Positive for weight loss.  Gastrointestinal: Negative for nausea.  Endo/Heme/Allergies:       Negative for hypoglycemia    PHYSICAL EXAM: Pt in no acute distress  RECENT LABS AND TESTS: BMET    Component Value Date/Time   NA 140 08/20/2018 0845   NA 138 12/22/2017 1235   K 3.6 08/20/2018 0845   CL 101 08/20/2018 0845   CO2 28 08/20/2018 0845   GLUCOSE 83 08/20/2018 0845   BUN 27 (H) 08/20/2018 0845   BUN 12 12/22/2017 1235   CREATININE 1.27 (H) 08/20/2018 0845   CALCIUM 9.0 08/20/2018 0845   GFRNONAA 46 (L) 08/20/2018 0845   GFRAA 53 (L) 08/20/2018 0845   Lab Results  Component Value Date   HGBA1C 5.6 05/31/2018   HGBA1C 6.1 (H) 12/22/2017   Lab Results  Component Value Date   INSULIN 4.8 05/31/2018   INSULIN 13.7 12/22/2017   CBC    Component Value Date/Time   WBC 3.9 (L) 08/20/2018 0845   RBC 3.05 (L) 08/20/2018 0845   HGB 9.4 (L) 08/20/2018 0845   HGB 9.7 (L) 03/26/2018 0859   HGB 12.3 12/22/2017 1235   HCT 30.1 (L) 08/20/2018 0845   HCT 38.2 12/22/2017 1235   PLT 154 08/20/2018 0845   PLT 306 03/26/2018 0859   MCV 98.7 08/20/2018 0845   MCV 92 12/22/2017 1235   MCH 30.8  08/20/2018 0845   MCHC 31.2 08/20/2018 0845   RDW 15.1 08/20/2018 0845   RDW 14.8 12/22/2017 1235   LYMPHSABS 1.6 08/20/2018 0845   LYMPHSABS 2.0 12/22/2017 1235   MONOABS 0.3 08/20/2018 0845   EOSABS 0.1 08/20/2018 0845   EOSABS 0.0 12/22/2017 1235   BASOSABS 0.0 08/20/2018 0845   BASOSABS 0.0 12/22/2017 1235   Iron/TIBC/Ferritin/ %Sat No results found for: IRON, TIBC, FERRITIN, IRONPCTSAT Lipid Panel     Component Value Date/Time   CHOL 176 12/22/2017 1235   TRIG 111 12/22/2017 1235   HDL 61 12/22/2017 1235   LDLCALC 93 12/22/2017 1235   Hepatic Function Panel     Component Value Date/Time   PROT 7.6 08/20/2018 0845   PROT 7.6 12/22/2017 1235   ALBUMIN 4.0 08/20/2018 0845   ALBUMIN 4.8 12/22/2017 1235   AST 20 08/20/2018 0845   ALT 12 08/20/2018 0845   ALKPHOS 103 08/20/2018 0845   BILITOT 0.6 08/20/2018 0845      Component Value Date/Time   TSH 3.644 03/10/2018 0538   TSH 1.930 12/22/2017 1235     Ref. Range 12/22/2017 12:35  Vitamin D, 25-Hydroxy Latest Ref Range: 30.0 - 100.0 ng/mL 41.9    I, Doreene Nest, am acting as Location manager for [employer]. Owens Shark, DO  I have reviewed the above documentation for accuracy and completeness, and I agree with the above. -Jearld Lesch, DO

## 2018-09-23 ENCOUNTER — Ambulatory Visit
Admission: RE | Admit: 2018-09-23 | Discharge: 2018-09-23 | Disposition: A | Discharge status: Home / Self Care | Payer: 59 | Source: Ambulatory Visit | Attending: Radiation Oncology | Admitting: Radiation Oncology

## 2018-09-23 ENCOUNTER — Other Ambulatory Visit: Payer: Self-pay

## 2018-09-23 DIAGNOSIS — Z510 Encounter for antineoplastic radiation therapy: Secondary | ICD-10-CM | POA: Diagnosis not present

## 2018-09-27 ENCOUNTER — Other Ambulatory Visit: Payer: Self-pay

## 2018-09-27 ENCOUNTER — Ambulatory Visit
Admission: RE | Admit: 2018-09-27 | Discharge: 2018-09-27 | Disposition: A | Discharge status: Home / Self Care | Payer: 59 | Source: Ambulatory Visit | Attending: Radiation Oncology | Admitting: Radiation Oncology

## 2018-09-27 DIAGNOSIS — Z510 Encounter for antineoplastic radiation therapy: Secondary | ICD-10-CM | POA: Diagnosis not present

## 2018-09-27 NOTE — Assessment & Plan Note (Signed)
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, grade 3, ER 0%, PR 0%, Ki-67 30%, HER-2 +3+ by IHC; hypoechoic mass 2 o'clock position 1.2 x 1.3 x 1.7 cm biopsy IDC, LV I present, grade 2-3, ER 0%, PR 0%, Ki-67 40%, HER-2 +3+, left axillary lymph node positive (3 nodes noted by [place]) T3 N1 stage IIIA  Treatment plan 1.Neoadjuvant chemotherapy with [place] completed 3/26/2020followed by Herceptin Perjeta maintenance versus Kadcyla maintenance  2.lumpectomywith axillary lymphnode dissection: 07/12/2018 3.Adjuvant radiation therapy --------------------------------------------------------------------------------------------------------------------------------------------------- Breast MRI 06/18/2018:No suspicious residual enhancement identified in the left breast, compatible with treatment response, interval decrease in size of left axillary lymph nodes 07/12/2018:Lumpectomy (Dr. Marlou Starks): complete treatment response, no residual carcinoma present in breast or 11/11 lymph nodes.  Treatment plan: 1.Adjuvant radiation therapy: Slightly delayed because of COVID-19.  It will be starting soon. 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  She stopped Vicodin.  Lower extremity edema: On Bumex, much improved Patient will come back every 3 weeks for Herceptin Perjeta and every 6 weeks for follow-up with me.

## 2018-09-28 ENCOUNTER — Other Ambulatory Visit: Payer: Self-pay

## 2018-09-28 ENCOUNTER — Ambulatory Visit
Admission: RE | Admit: 2018-09-28 | Discharge: 2018-09-28 | Disposition: A | Discharge status: Home / Self Care | Payer: 59 | Source: Ambulatory Visit | Attending: Radiation Oncology | Admitting: Radiation Oncology

## 2018-09-28 DIAGNOSIS — Z510 Encounter for antineoplastic radiation therapy: Secondary | ICD-10-CM | POA: Diagnosis not present

## 2018-09-29 ENCOUNTER — Ambulatory Visit
Admission: RE | Admit: 2018-09-29 | Discharge: 2018-09-29 | Disposition: A | Discharge status: Home / Self Care | Payer: 59 | Source: Ambulatory Visit | Attending: Radiation Oncology | Admitting: Radiation Oncology

## 2018-09-29 ENCOUNTER — Other Ambulatory Visit: Payer: Self-pay

## 2018-09-29 DIAGNOSIS — Z510 Encounter for antineoplastic radiation therapy: Secondary | ICD-10-CM | POA: Diagnosis not present

## 2018-09-30 ENCOUNTER — Other Ambulatory Visit: Payer: Self-pay

## 2018-09-30 ENCOUNTER — Ambulatory Visit
Admission: RE | Admit: 2018-09-30 | Discharge: 2018-09-30 | Disposition: A | Discharge status: Home / Self Care | Payer: 59 | Source: Ambulatory Visit | Attending: Radiation Oncology | Admitting: Radiation Oncology

## 2018-09-30 DIAGNOSIS — Z510 Encounter for antineoplastic radiation therapy: Secondary | ICD-10-CM | POA: Diagnosis not present

## 2018-09-30 NOTE — Progress Notes (Signed)
Patient Care Team: Velna Hatchet, MD as PCP - General (Internal Medicine) Jovita Kussmaul, MD as Consulting Physician (General Surgery) Nicholas Lose, MD as Consulting Physician (Hematology and Oncology) Eppie Gibson, MD as Attending Physician (Radiation Oncology)  DIAGNOSIS:    ICD-10-CM   1. Malignant neoplasm of upper-outer quadrant of left breast in female, estrogen receptor negative ([place])  C50.412    Z17.1     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 breast ([place])  01/18/2018 Initial Diagnosis   Screening mammogram detected microcalcifications and asymmetry, left breast segmental microcalcifications UOQ 1.5 x 4.3 x 5.4 cm biopsy-proven IDC with DCIS with LV I, grade 3, ER 0%, PR 0%, Ki-67 30%, HER-2 +3+ by IHC; hypoechoic mass 2 o'clock position 1.2 x 1.3 x 1.7 cm biopsy IDC, LV I present, grade 2-3, ER 0%, PR 0%, Ki-67 40%, HER-2 +3+, left axillary lymph node positive (3 nodes noted by [place]) T3 N1 stage IIIA   02/03/2018 Cancer Staging   Staging form: Breast, AJCC 8th Edition - Clinical: Stage IIIA (cT3, cN1, cM0, G3, ER-, PR-, HER2+) - Signed by Nicholas Lose, MD on 02/03/2018   02/20/2018 Genetic Testing   No pathogenic variants identified on the [facility] Cancer[facility] Expanded + RNA insight panel. Genes Analyzed (67 total): AIP, ALK, APC*, ATM*, BAP1, BARD1, BLM, BMPR1A, BRCA1*, BRCA2*, BRIP1*, CDH1*, CDK4, CDKN1B, CDKN2A, CHEK2*, DICER1, FANCC, FH, FLCN, GALNT12, HOXB13, MAX, MEN1, MET, MLH1*, MRE11A, MSH2*, MSH6*, MUTYH*, NBN, NF1*, NF2, PALB2*, PHOX2B, PMS2*, POLD1, POLE, POT1, PRKAR1A, PTCH1, PTEN*, RAD50, RAD51C*, RAD51D*, RB1, RET, SDHA, SDHAF2, SDHB, SDHC, SDHD, SMAD4, SMARCA4, SMARCB1, SMARCE1, STK11, SUFU, TMEM127, TP53*, TSC1, TSC2, VHL and XRCC2 (sequencing and deletion/duplication); MITF (sequencing only); EPCAM and GREM1 (deletion/duplication only). DNA and RNA analyses performed for * genes. The report date is 02/20/2018.    03/04/2018 -  Neo-Adjuvant Chemotherapy   Neoadjuvant chemotherapy with [facility] Perjeta   03/08/2018 - 03/10/2018 Hospital Admission   Admitted for recurrent syncope with loss of consciousness for up to 5 minutes with loss of bladder, no abnormalities identified.  MRI brain, carotid ultrasound, EEG studies were negative.   06/17/2018 Breast MRI   No suspicious residual enhancement identified in the left breast, compatible with treatment response, interval decrease in size of left axillary lymph nodes   07/12/2018 Surgery   Lumpectomy (Dr. Marlou Starks): complete treatment response, no residual carcinoma present in breast or 11/11 lymph nodes.    Carcinoma of upper-outer quadrant of left breast in female, estrogen receptor negative ([place])  07/12/2018 Initial Diagnosis   Carcinoma of upper-outer quadrant of left breast in female, estrogen receptor negative ([place])   08/11/2018 Cancer Staging   Staging form: Breast, AJCC 8th Edition - Pathologic: No Stage Recommended (ypT0, pN0) - Signed by Eppie Gibson, MD on 08/11/2018   08/11/2018 Cancer Staging   Staging form: Breast, AJCC 8th Edition - Clinical: Stage IIIA (cT3, cN1, cM0, G3, ER-, PR-, HER2+) - Signed by Eppie Gibson, MD on 08/11/2018   08/20/2018 -  Chemotherapy   The patient had trastuzumab (HERCEPTIN) 450 mg in sodium chloride 0.9 % 250 mL chemo infusion, 462 mg (100 % of original dose 6 mg/kg), Intravenous,  Once, 2 of 9 cycles Dose modification: 6 mg/kg (original dose 6 mg/kg, Cycle 1, Reason: Provider Judgment) Administration: 450 mg (08/20/2018), 450 mg (09/10/2018) pertuzumab (PERJETA) 420 mg in sodium chloride 0.9 % 250 mL chemo infusion, 420 mg (100 % of original dose 420 mg), Intravenous, Once, 2 of 9 cycles  Dose modification: 420 mg (original dose 420 mg, Cycle 1, Reason: Provider Judgment) Administration: 420 mg (08/20/2018), 420 mg (09/10/2018)  for chemotherapy treatment.      CHIEF COMPLIANT: Herceptin Perjeta maintenance  INTERVAL  HISTORY: Christy Serrano is a 63 y.o. with above-mentioned history of left breast cancer treated with neoadjuvant chemotherapy and lumpectomy who is currently undergoing radiation and Herceptin Perjeta maintenance. She presents to the clinic today for follow-up during radiation and treatment.   REVIEW OF SYSTEMS:   Constitutional: Denies fevers, chills or abnormal weight loss Eyes: Denies blurriness of vision Ears, nose, mouth, throat, and face: Denies mucositis or sore throat Respiratory: Denies cough, dyspnea or wheezes Cardiovascular: Denies palpitation, chest discomfort Gastrointestinal: Denies nausea, heartburn or change in bowel habits Skin: Denies abnormal skin rashes Lymphatics: Denies new lymphadenopathy or easy bruising Neurological: Denies numbness, tingling or new weaknesses Behavioral/Psych: Mood is stable, no new changes  Extremities: No lower extremity edema Breast: denies any pain or lumps or nodules in either breasts All other systems were reviewed with the patient and are negative.  I have reviewed the past medical history, past surgical history, social history and family history with the patient and they are unchanged from previous note.  ALLERGIES:  is allergic to oxycodone-acetaminophen; gabapentin; and amoxicillin.  MEDICATIONS:  Current Outpatient Medications  Medication Sig Dispense Refill  . acetaminophen (TYLENOL) 500 MG tablet Take 1,000 mg by mouth every 6 (six) hours as needed for mild pain.    Marland Kitchen albuterol (PROVENTIL HFA;VENTOLIN HFA) 108 (90 Base) MCG/ACT inhaler Inhale into the lungs every 6 (six) hours as needed for wheezing or shortness of breath.    . budesonide-formoterol (SYMBICORT) 160-4.5 MCG/ACT inhaler Inhale 2 puffs into the lungs 2 (two) times daily.    . bumetanide (BUMEX) 1 MG tablet Take 1 tablet (1 mg total) by mouth 2 (two) times daily. 60 tablet 0  . Calcium-Phosphorus-Vitamin D (CITRACAL +D3 PO) Take 1,200 mcg by mouth.    .  cholecalciferol (VITAMIN D) 1000 units tablet Take 1,000 Units by mouth daily.    Marland Kitchen estradiol (VIVELLE-DOT) 0.05 MG/24HR patch     . gabapentin (NEURONTIN) 100 MG capsule Take 1 capsule (100 mg total) by mouth daily. 90 capsule 3  . HYDROcodone-acetaminophen (NORCO/VICODIN) 5-325 MG tablet Take 1 tablet by mouth every 6 (six) hours as needed for moderate pain or severe pain. 30 tablet 0  . Multiple Vitamin (MULTIVITAMIN) capsule Take 1 capsule by mouth daily.    . potassium chloride SA (K-DUR) 20 MEQ tablet Take 1 tablet (20 mEq total) by mouth daily. 90 tablet 1  . spironolactone (ALDACTONE) 25 MG tablet Take 0.5 tablets (12.5 mg total) by mouth daily. 45 tablet 3   No current facility-administered medications for this visit.     PHYSICAL EXAMINATION: ECOG PERFORMANCE STATUS: 1 - Symptomatic but completely ambulatory  Vitals:   10/01/18 0901  BP: 126/64  Pulse: 63  Resp: 18  Temp: 98.7 F (37.1 C)  SpO2: 100%   Filed Weights   10/01/18 0901  Weight: 174 lb 11.2 oz (79.2 kg)    GENERAL: alert, no distress and comfortable SKIN: skin color, texture, turgor are normal, no rashes or significant lesions EYES: normal, Conjunctiva are pink and non-injected, sclera clear OROPHARYNX: no exudate, no erythema and lips, buccal mucosa, and tongue normal  NECK: supple, thyroid normal size, non-tender, without nodularity LYMPH: no palpable lymphadenopathy in the cervical, axillary or inguinal LUNGS: clear to auscultation and percussion with normal breathing effort  HEART: regular rate & rhythm and no murmurs and no lower extremity edema ABDOMEN: abdomen soft, non-tender and normal bowel sounds MUSCULOSKELETAL: no cyanosis of digits and no clubbing  NEURO: alert & oriented x 3 with fluent speech, no focal motor/sensory deficits EXTREMITIES: No lower extremity edema  LABORATORY DATA:  I have reviewed the data as listed CMP Latest Ref Rng & Units 08/20/2018 06/17/2018 05/27/2018  Glucose 70 - 99  mg/dL 83 92 117(H)  BUN 8 - 23 mg/dL 27(H) 12 14  Creatinine 0.44 - 1.00 mg/dL 1.27(H) 0.82 0.83  Sodium 135 - 145 mmol/L 140 139 137  Potassium 3.5 - 5.1 mmol/L 3.6 3.9 4.3  Chloride 98 - 111 mmol/L 101 105 107  CO2 22 - 32 mmol/L 28 27 24   Calcium 8.9 - 10.3 mg/dL 9.0 7.9(L) 9.0  Total Protein 6.5 - 8.1 g/dL 7.6 6.2(L) 6.6  Total Bilirubin 0.3 - 1.2 mg/dL 0.6 0.6 0.3  Alkaline Phos 38 - 126 U/L 103 71 70  AST 15 - 41 U/L 20 19 49(H)  ALT 0 - 44 U/L 12 11 32    Lab Results  Component Value Date   WBC 3.9 (L) 08/20/2018   HGB 9.4 (L) 08/20/2018   HCT 30.1 (L) 08/20/2018   MCV 98.7 08/20/2018   PLT 154 08/20/2018   NEUTROABS 1.9 08/20/2018    ASSESSMENT & PLAN:  Malignant neoplasm of left female breast ([place]) 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, grade 3, ER 0%, PR 0%, Ki-67 30%, HER-2 +3+ by IHC; hypoechoic mass 2 o'clock position 1.2 x 1.3 x 1.7 cm biopsy IDC, LV I present, grade 2-3, ER 0%, PR 0%, Ki-67 40%, HER-2 +3+, left axillary lymph node positive (3 nodes noted by [place]) T3 N1 stage IIIA  Treatment plan 1.Neoadjuvant chemotherapy with [place] completed 3/26/2020followed by Herceptin Perjeta maintenance versus Kadcyla maintenance  2.lumpectomywith axillary lymphnode dissection: 07/12/2018 3.Adjuvant radiation therapy --------------------------------------------------------------------------------------------------------------------------------------------------- Breast MRI 06/18/2018:No suspicious residual enhancement identified in the left breast, compatible with treatment response, interval decrease in size of left axillary lymph nodes 07/12/2018:Lumpectomy (Dr. Marlou Starks): complete treatment response, no residual carcinoma present in breast or 11/11 lymph nodes.  Treatment plan: 1.Adjuvant radiation therapy: Slightly delayed  because of COVID-19.  It will be starting soon. 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  She stopped Vicodin.  Lower extremity edema: On Bumex, much improved Patient will come back every 3 weeks for Herceptin Perjeta and every 6 weeks for follow-up with me.    No orders of the defined types were placed in this encounter.  The patient has a good understanding of the overall plan. she agrees with it. she will call with any problems that may develop before the next visit here.  Nicholas Lose, MD 10/01/2018  Julious Oka Dorshimer am acting as scribe for Dr. Nicholas Lose.  I have reviewed the above documentation for accuracy and completeness, and I agree with the above.

## 2018-10-01 ENCOUNTER — Other Ambulatory Visit: Payer: Self-pay

## 2018-10-01 ENCOUNTER — Encounter: Payer: Self-pay | Admitting: Hematology and Oncology

## 2018-10-01 ENCOUNTER — Encounter: Payer: Self-pay | Admitting: *Deleted

## 2018-10-01 ENCOUNTER — Ambulatory Visit
Admission: RE | Admit: 2018-10-01 | Discharge: 2018-10-01 | Disposition: A | Discharge status: Home / Self Care | Payer: 59 | Source: Ambulatory Visit | Attending: Radiation Oncology | Admitting: Radiation Oncology

## 2018-10-01 ENCOUNTER — Inpatient Hospital Stay: Payer: 59

## 2018-10-01 ENCOUNTER — Inpatient Hospital Stay: Payer: 59 | Attending: Hematology and Oncology | Admitting: Hematology and Oncology

## 2018-10-01 ENCOUNTER — Other Ambulatory Visit: Payer: Managed Care, Other (non HMO)

## 2018-10-01 VITALS — BP 123/59 | HR 63 | Temp 98.1°F | Resp 17

## 2018-10-01 DIAGNOSIS — Z171 Estrogen receptor negative status [ER-]: Secondary | ICD-10-CM | POA: Diagnosis not present

## 2018-10-01 DIAGNOSIS — C50412 Malignant neoplasm of upper-outer quadrant of left female breast: Secondary | ICD-10-CM

## 2018-10-01 DIAGNOSIS — Z510 Encounter for antineoplastic radiation therapy: Secondary | ICD-10-CM | POA: Diagnosis not present

## 2018-10-01 MED ORDER — ACETAMINOPHEN 325 MG PO TABS
650.0000 mg | ORAL_TABLET | Freq: Once | ORAL | Status: AC
  Administered 2018-10-01: 650 mg via ORAL

## 2018-10-01 MED ORDER — DIPHENHYDRAMINE HCL 25 MG PO CAPS
50.0000 mg | ORAL_CAPSULE | Freq: Once | ORAL | Status: AC
  Administered 2018-10-01: 50 mg via ORAL

## 2018-10-01 MED ORDER — TRASTUZUMAB CHEMO 150 MG IV SOLR
450.0000 mg | Freq: Once | INTRAVENOUS | Status: AC
  Administered 2018-10-01: 11:00:00 450 mg via INTRAVENOUS
  Filled 2018-10-01: qty 21.43

## 2018-10-01 MED ORDER — DIPHENHYDRAMINE HCL 25 MG PO CAPS
ORAL_CAPSULE | ORAL | Status: AC
  Filled 2018-10-01: qty 2

## 2018-10-01 MED ORDER — SODIUM CHLORIDE 0.9 % IV SOLN
Freq: Once | INTRAVENOUS | Status: AC
  Administered 2018-10-01: 10:00:00 via INTRAVENOUS
  Filled 2018-10-01: qty 250

## 2018-10-01 MED ORDER — HEPARIN SOD (PORK) LOCK FLUSH 100 UNIT/ML IV SOLN
500.0000 [IU] | Freq: Once | INTRAVENOUS | Status: AC | PRN
  Administered 2018-10-01: 500 [IU]
  Filled 2018-10-01: qty 5

## 2018-10-01 MED ORDER — SODIUM CHLORIDE 0.9% FLUSH
10.0000 mL | INTRAVENOUS | Status: DC | PRN
  Administered 2018-10-01: 13:00:00 10 mL
  Filled 2018-10-01: qty 10

## 2018-10-01 MED ORDER — ACETAMINOPHEN 325 MG PO TABS
ORAL_TABLET | ORAL | Status: AC
  Filled 2018-10-01: qty 2

## 2018-10-01 MED ORDER — SODIUM CHLORIDE 0.9 % IV SOLN
420.0000 mg | Freq: Once | INTRAVENOUS | Status: AC
  Administered 2018-10-01: 420 mg via INTRAVENOUS
  Filled 2018-10-01: qty 14

## 2018-10-01 NOTE — Patient Instructions (Signed)
[facility] Discharge Instructions for Patients Receiving Chemotherapy  Today you received the following chemotherapy agents :  Herceptin, Perjeta.  To help prevent nausea and vomiting after your treatment, we encourage you to take your nausea medication as prescribed.   If you develop nausea and vomiting that is not controlled by your nausea medication, call the clinic.   BELOW ARE SYMPTOMS THAT SHOULD BE REPORTED IMMEDIATELY:  *FEVER GREATER THAN 100.5 F  *CHILLS WITH OR WITHOUT FEVER  NAUSEA AND VOMITING THAT IS NOT CONTROLLED WITH YOUR NAUSEA MEDICATION  *UNUSUAL SHORTNESS OF BREATH  *UNUSUAL BRUISING OR BLEEDING  TENDERNESS IN MOUTH AND THROAT WITH OR WITHOUT PRESENCE OF ULCERS  *URINARY PROBLEMS  *BOWEL PROBLEMS  UNUSUAL RASH Items with * indicate a potential emergency and should be followed up as soon as possible.  Feel free to call the clinic should you have any questions or concerns. The clinic phone number is [phone].  Please show the CHEMO ALERT CARD at check-in to the Emergency Department and triage nurse.   

## 2018-10-04 ENCOUNTER — Ambulatory Visit (INDEPENDENT_AMBULATORY_CARE_PROVIDER_SITE_OTHER): Payer: Self-pay | Admitting: Orthopedic Surgery

## 2018-10-04 ENCOUNTER — Other Ambulatory Visit: Payer: Self-pay

## 2018-10-04 ENCOUNTER — Ambulatory Visit (INDEPENDENT_AMBULATORY_CARE_PROVIDER_SITE_OTHER): Payer: Self-pay

## 2018-10-04 ENCOUNTER — Encounter: Payer: Self-pay | Admitting: Orthopedic Surgery

## 2018-10-04 ENCOUNTER — Ambulatory Visit
Admission: RE | Admit: 2018-10-04 | Discharge: 2018-10-04 | Disposition: A | Discharge status: Home / Self Care | Payer: 59 | Source: Ambulatory Visit | Attending: Radiation Oncology | Admitting: Radiation Oncology

## 2018-10-04 DIAGNOSIS — Z510 Encounter for antineoplastic radiation therapy: Secondary | ICD-10-CM | POA: Diagnosis not present

## 2018-10-04 DIAGNOSIS — M25512 Pain in left shoulder: Secondary | ICD-10-CM

## 2018-10-04 NOTE — Progress Notes (Signed)
Office Visit Note   Patient: Christy Serrano           Date of Birth: 1955/09/06           MRN: [id number] Visit Date: 10/04/2018 Requested by: Eppie Gibson, MD [address] [place],  [place] [address] PCP: Velna Hatchet, MD  Subjective: Chief Complaint  Patient presents with  . Left Shoulder - Pain    HPI: Christy Serrano is a patient with left shoulder pain of several months duration.  She had left breast cancer surgery in 07/12/2018.  She had some physical therapy for "cording" associated with her dissection.  She does report some popping which developed in the shoulder after that surgical event with subsequent therapy.  She does have a history of having right shoulder rotator cuff tear surgery.  Denies any weakness in that left arm.  She is right-hand dominant.  She has 2 more weeks of radiation therapy for her cancer.  She also is getting antibiotic therapy for her cancer.              ROS: All systems reviewed are negative as they relate to the chief complaint within the history of present illness.  Patient denies  fevers or chills.   Assessment & Plan: Visit Diagnoses:  1. Left shoulder pain, unspecified chronicity     Plan: Impression is left shoulder pain with definite popping.  Potentially slight weakness on supraspinatus testing but no real evidence of frozen shoulder.  Based on her history with the right shoulder as well as the definite mechanical symptoms in the left along with slight weakness and her history of breast cancer I think MRI arthrogram left shoulder to evaluate for rotator cuff tear is indicated.  I will see her back after that study.  Follow-Up Instructions: Return for after MRI.   Orders:  Orders Placed This Encounter  Procedures  . XR Shoulder Left  . MR Shoulder Left w/ contrast  . Arthrogram   No orders of the defined types were placed in this encounter.     Procedures: No procedures performed   Clinical Data: No additional findings.   Objective: Vital Signs: There were no vitals taken for this visit.  Physical Exam:   Constitutional: Patient appears well-developed HEENT:  Head: Normocephalic Eyes:EOM are normal Neck: Normal range of motion Cardiovascular: Normal rate Pulmonary/chest: Effort normal Neurologic: Patient is alert Skin: Skin is warm Psychiatric: Patient has normal mood and affect    Ortho Exam: Ortho exam demonstrates good cervical spine range of motion.  Patient does have a definite mechanical pop in the left shoulder with internal and external rotation at 15 degrees of abduction.  Less prominent at 90 degrees of abduction.  Cuff strength is pretty reasonable to subscap and infraspinatus testing.  Potentially slightly weaker this supraspinatus testing on the left compared to the right.  No discrete AC joint tenderness is noted.  In general no restriction of passive external rotation of 15 degrees of abduction.  No other masses lymphadenopathy or skin changes palpable in that right shoulder region.  The cording she describes is fairly minimal at this time in the axilla region.  Specialty Comments:  No specialty comments available.  Imaging: Xr Shoulder Left  Result Date: 10/04/2018 AP outlet axillary left shoulder reviewed.  Surgical clips noted in the axilla.  Visualized lung fields clear.  Acromiohumeral distance maintained.  AC joint without significant arthritis.  No bony lesions noted in the proximal humerus or shoulder girdle region on  the left-hand side.    PMFS History: Patient Active Problem List   Diagnosis Date Noted  . Carcinoma of upper-outer quadrant of left breast in female, estrogen receptor negative ([facility]) 07/12/2018  . Seizure-like activity ([facility])   . Hyponatremia   . Recurrent syncope 03/08/2018  . LFT elevation   . Seizure ([facility])   . Thrombocytopenia ([facility])   . Port-A-Cath in place 03/04/2018  . Genetic testing 02/23/2018  . Family history of breast cancer   . Family history  of prostate cancer   . Family history of ovarian cancer   . Malignant neoplasm of left female breast ([facility]) 02/03/2018  . Prediabetes 01/06/2018  . Reactive airway disease without complication 27/25/3664  . Class 1 obesity with serious comorbidity and body mass index (BMI) of 32.0 to 32.9 in adult 12/22/2017   Past Medical History:  Diagnosis Date  . Cancer ([facility])    left breast  . Chronic bronchitis ([facility])   . Family history of breast cancer   . Family history of ovarian cancer   . Family history of prostate cancer   . GERD (gastroesophageal reflux disease)    due to chemotherapy  . Lactose intolerance   . PONV (postoperative nausea and vomiting)     Family History  Problem Relation Age of Onset  . High blood pressure Mother   . Heart attack Mother        CHF  . High blood pressure Father   . Prostate cancer Father        dx mid 64s  . Breast cancer Sister 57       negative Invitae 46 gene panel  . Breast cancer Paternal Aunt        dx mid 52s  . Cancer Paternal Uncle        jaw cancer d. 27s  . Ovarian cancer Maternal Grandmother        d. 40s  . Cancer Paternal Grandmother        either stomach or ovarian cancer  . Breast cancer Sister 64       "negative 21 gene panel"    Past Surgical History:  Procedure Laterality Date  . BREAST LUMPECTOMY WITH RADIOACTIVE SEED AND AXILLARY LYMPH NODE DISSECTION Left 07/12/2018   Procedure: LEFT BREAST RADIOACTIVE SEED X 3 LUMPECTOMY X 3  AND AXILLARY LYMPH NODE DISSECTION;  Surgeon: Jovita Kussmaul, MD;  Location: [facility];  Service: General;  Laterality: Left;  . PORTACATH PLACEMENT Right 03/03/2018   Procedure: INSERTION PORT-A-CATH;  Surgeon: Jovita Kussmaul, MD;  Location: [facility];  Service: General;  Laterality: Right;  . ROTATOR CUFF REPAIR Right 2006  . TOTAL ABDOMINAL HYSTERECTOMY  2010   Social History   Occupational History  . Occupation: Quarry manager    Comment: [employer]  Tobacco Use  .  Smoking status: Never Smoker  . Smokeless tobacco: Never Used  Substance and Sexual Activity  . Alcohol use: No    Comment: quit long ago  . Drug use: No  . Sexual activity: Not on file

## 2018-10-05 ENCOUNTER — Other Ambulatory Visit: Payer: Self-pay

## 2018-10-05 ENCOUNTER — Ambulatory Visit
Admission: RE | Admit: 2018-10-05 | Discharge: 2018-10-05 | Disposition: A | Discharge status: Home / Self Care | Payer: 59 | Source: Ambulatory Visit | Attending: Radiation Oncology | Admitting: Radiation Oncology

## 2018-10-05 DIAGNOSIS — Z510 Encounter for antineoplastic radiation therapy: Secondary | ICD-10-CM | POA: Diagnosis not present

## 2018-10-06 ENCOUNTER — Other Ambulatory Visit: Payer: Self-pay

## 2018-10-06 ENCOUNTER — Ambulatory Visit
Admission: RE | Admit: 2018-10-06 | Discharge: 2018-10-06 | Disposition: A | Discharge status: Home / Self Care | Payer: 59 | Source: Ambulatory Visit | Attending: Radiation Oncology | Admitting: Radiation Oncology

## 2018-10-06 DIAGNOSIS — Z510 Encounter for antineoplastic radiation therapy: Secondary | ICD-10-CM | POA: Diagnosis not present

## 2018-10-07 ENCOUNTER — Ambulatory Visit
Admission: RE | Admit: 2018-10-07 | Discharge: 2018-10-07 | Disposition: A | Discharge status: Home / Self Care | Payer: 59 | Source: Ambulatory Visit | Attending: Radiation Oncology | Admitting: Radiation Oncology

## 2018-10-07 ENCOUNTER — Ambulatory Visit: Payer: 59

## 2018-10-07 ENCOUNTER — Other Ambulatory Visit: Payer: Self-pay

## 2018-10-07 DIAGNOSIS — Z510 Encounter for antineoplastic radiation therapy: Secondary | ICD-10-CM | POA: Diagnosis not present

## 2018-10-08 ENCOUNTER — Ambulatory Visit
Admission: RE | Admit: 2018-10-08 | Discharge: 2018-10-08 | Disposition: A | Discharge status: Home / Self Care | Payer: 59 | Source: Ambulatory Visit | Attending: Radiation Oncology | Admitting: Radiation Oncology

## 2018-10-08 ENCOUNTER — Ambulatory Visit: Payer: 59

## 2018-10-08 ENCOUNTER — Other Ambulatory Visit: Payer: Self-pay

## 2018-10-08 DIAGNOSIS — Z510 Encounter for antineoplastic radiation therapy: Secondary | ICD-10-CM | POA: Diagnosis not present

## 2018-10-11 ENCOUNTER — Ambulatory Visit
Admission: RE | Admit: 2018-10-11 | Discharge: 2018-10-11 | Disposition: A | Discharge status: Home / Self Care | Payer: 59 | Source: Ambulatory Visit | Attending: Radiation Oncology | Admitting: Radiation Oncology

## 2018-10-11 ENCOUNTER — Other Ambulatory Visit: Payer: Self-pay

## 2018-10-11 DIAGNOSIS — C50412 Malignant neoplasm of upper-outer quadrant of left female breast: Secondary | ICD-10-CM

## 2018-10-11 DIAGNOSIS — Z171 Estrogen receptor negative status [ER-]: Secondary | ICD-10-CM

## 2018-10-11 DIAGNOSIS — Z510 Encounter for antineoplastic radiation therapy: Secondary | ICD-10-CM | POA: Diagnosis not present

## 2018-10-11 MED ORDER — RADIAPLEXRX EX GEL
Freq: Once | CUTANEOUS | Status: AC
  Administered 2018-10-11: 12:00:00 via TOPICAL

## 2018-10-11 MED ORDER — ALRA NON-METALLIC DEODORANT (RAD-ONC)
1.0000 "application " | Freq: Once | TOPICAL | Status: AC
  Administered 2018-10-11: 1 via TOPICAL

## 2018-10-12 ENCOUNTER — Ambulatory Visit
Admission: RE | Admit: 2018-10-12 | Discharge: 2018-10-12 | Disposition: A | Discharge status: Home / Self Care | Payer: 59 | Source: Ambulatory Visit | Attending: Radiation Oncology | Admitting: Radiation Oncology

## 2018-10-12 ENCOUNTER — Other Ambulatory Visit: Payer: Self-pay | Admitting: Hematology and Oncology

## 2018-10-12 ENCOUNTER — Encounter: Payer: Self-pay | Admitting: Hematology and Oncology

## 2018-10-12 ENCOUNTER — Other Ambulatory Visit: Payer: Self-pay

## 2018-10-12 ENCOUNTER — Ambulatory Visit: Payer: 59

## 2018-10-12 DIAGNOSIS — Z510 Encounter for antineoplastic radiation therapy: Secondary | ICD-10-CM | POA: Diagnosis not present

## 2018-10-12 MED ORDER — HYDROCODONE-ACETAMINOPHEN 5-325 MG PO TABS: 1.0000 | ORAL_TABLET | Freq: Four times a day (QID) | ORAL | 0 refills | Status: DC | PRN

## 2018-10-13 ENCOUNTER — Other Ambulatory Visit: Payer: Self-pay

## 2018-10-13 ENCOUNTER — Ambulatory Visit: Payer: 59

## 2018-10-13 ENCOUNTER — Ambulatory Visit
Admission: RE | Admit: 2018-10-13 | Discharge: 2018-10-13 | Disposition: A | Discharge status: Home / Self Care | Payer: 59 | Source: Ambulatory Visit | Attending: Radiation Oncology | Admitting: Radiation Oncology

## 2018-10-13 DIAGNOSIS — Z510 Encounter for antineoplastic radiation therapy: Secondary | ICD-10-CM | POA: Diagnosis not present

## 2018-10-14 ENCOUNTER — Ambulatory Visit
Admission: RE | Admit: 2018-10-14 | Discharge: 2018-10-14 | Disposition: A | Discharge status: Home / Self Care | Payer: 59 | Source: Ambulatory Visit | Attending: Radiation Oncology | Admitting: Radiation Oncology

## 2018-10-14 ENCOUNTER — Encounter: Payer: Self-pay | Admitting: Radiation Oncology

## 2018-10-14 ENCOUNTER — Other Ambulatory Visit: Payer: Self-pay

## 2018-10-14 DIAGNOSIS — Z510 Encounter for antineoplastic radiation therapy: Secondary | ICD-10-CM | POA: Diagnosis not present

## 2018-10-18 ENCOUNTER — Encounter: Payer: Self-pay | Admitting: Hematology and Oncology

## 2018-10-20 ENCOUNTER — Ambulatory Visit (INDEPENDENT_AMBULATORY_CARE_PROVIDER_SITE_OTHER): Payer: 59 | Admitting: Bariatrics

## 2018-10-20 ENCOUNTER — Encounter (INDEPENDENT_AMBULATORY_CARE_PROVIDER_SITE_OTHER): Payer: Self-pay | Admitting: Bariatrics

## 2018-10-20 ENCOUNTER — Other Ambulatory Visit: Payer: Self-pay

## 2018-10-20 VITALS — BP 121/66 | HR 67 | Temp 98.3°F | Ht 65.0 in | Wt 168.0 lb

## 2018-10-20 DIAGNOSIS — E669 Obesity, unspecified: Secondary | ICD-10-CM | POA: Diagnosis not present

## 2018-10-20 DIAGNOSIS — Z9189 Other specified personal risk factors, not elsewhere classified: Secondary | ICD-10-CM | POA: Diagnosis not present

## 2018-10-20 DIAGNOSIS — Z6830 Body mass index (BMI) 30.0-30.9, adult: Secondary | ICD-10-CM

## 2018-10-20 DIAGNOSIS — R7303 Prediabetes: Secondary | ICD-10-CM

## 2018-10-20 DIAGNOSIS — E559 Vitamin D deficiency, unspecified: Secondary | ICD-10-CM

## 2018-10-20 NOTE — Progress Notes (Signed)
Office: [phone]  /  Fax: [phone]   HPI:   Chief Complaint: OBESITY Christy Serrano is here to discuss her progress with her obesity treatment plan. She is on the Category 3 plan and is following her eating plan approximately 50% of the time. She states she is exercising 0 minutes 0 times per week. Christy Serrano is down 7 lbs and is doing well overall. She has finished chemo and radiation and is "cancer free." She states her taste is coming back. Her weight is 168 lb (76.2 kg) today and has had a weight loss of 7 pounds over a period of 4 months since her last in-office visit. She has lost 27 lbs since starting treatment with [place].  Pre-Diabetes Christy Serrano has a diagnosis of prediabetes based on her elevated Hgb A1c and was informed this puts her at greater risk of developing diabetes. Shecontinues to work on diet and exercise to decrease risk of diabetes. She denies polyphagia.  At risk for diabetes Christy Serrano is at higher than average risk for developing diabetes due to her obesity. She currently denies polyuria or polydipsia.  Vitamin D deficiency Christy Serrano has a diagnosis of Vitamin D deficiency. She is currently taking OTC Vit D and denies nausea, vomiting or muscle weakness.  ASSESSMENT AND PLAN:  Prediabetes - Plan: Hemoglobin A1c  Vitamin D deficiency - Plan: VITAMIN D 25 Hydroxy (Vit-D Deficiency, Fractures)  At risk for diabetes mellitus  Class 1 obesity with serious comorbidity and body mass index (BMI) of 30.0 to 30.9 in adult, unspecified obesity type  PLAN:  Pre-Diabetes Christy Serrano will continue to work on weight loss, exercise, and decreasing simple carbohydrates in her diet to help decrease the risk of diabetes. We dicussed metformin including benefits and risks. She was informed that eating too many simple carbohydrates or too many calories at one sitting increases the likelihood of GI side effects. Christy Serrano will have A1c checked and follow-up with [place] as directed to monitor  her progress.  Diabetes risk counseling Christy Serrano was given extended (15 minutes) diabetes prevention counseling today. She is 63 y.o. female and has risk factors for diabetes including obesity. We discussed intensive lifestyle modifications today with an emphasis on weight loss as well as increasing exercise and decreasing simple carbohydrates in her diet.  Vitamin D Deficiency Christy Serrano was informed that low Vitamin D levels contributes to fatigue and are associated with obesity, breast, and colon cancer. She agrees to continue taking OTC Vit D and will have routine testing of Vitamin D today. She was informed of the risk of over-replacement of Vitamin D and agrees to not increase her dose unless she discusses this with [place] first. Christy Serrano agrees to follow-up with our clinic in 4 weeks.  Obesity Christy Serrano is currently in the action stage of change. As such, her goal is to continue with weight loss efforts. She has agreed to follow the Category 3 plan. Christy Serrano will work on meal planning and intentional eating. Handouts were given on Protein and Breakdown for Breakfast, Lunch, and Dinner. Christy Serrano has been instructed to work up to a goal of 150 minutes of combined cardio and strengthening exercise per week for weight loss and overall health benefits. We discussed the following Behavioral Modification Strategies today: increasing lean protein intake, decreasing simple carbohydrates, increasing vegetables, increase H20 intake, decrease eating out, no skipping meals, work on meal planning and easy cooking plans, and keeping healthy foods in the home.   Christy Serrano has agreed to follow-up with our clinic in 4 weeks. She was  informed of the importance of frequent follow-up visits to maximize her success with intensive lifestyle modifications for her multiple health conditions.  ALLERGIES: Allergies  Allergen Reactions  . Oxycodone-Acetaminophen Other (See Comments) and Palpitations    tachycardia   .  Gabapentin Swelling    Lower extremity edema  . Amoxicillin Rash    DID THE REACTION INVOLVE: Swelling of the face/tongue/throat, SOB, or low BP? No Sudden or severe rash/hives, skin peeling, or the inside of the mouth or nose? No Did it require medical treatment? No When did it last happen?in her 45's If all above answers are "NO", may proceed with cephalosporin use.     MEDICATIONS: Current Outpatient Medications on File Prior to Visit  Medication Sig Dispense Refill  . acetaminophen (TYLENOL) 500 MG tablet Take 1,000 mg by mouth every 6 (six) hours as needed for mild pain.    Marland Kitchen albuterol (PROVENTIL HFA;VENTOLIN HFA) 108 (90 Base) MCG/ACT inhaler Inhale into the lungs every 6 (six) hours as needed for wheezing or shortness of breath.    . budesonide-formoterol (SYMBICORT) 160-4.5 MCG/ACT inhaler Inhale 2 puffs into the lungs 2 (two) times daily.    . bumetanide (BUMEX) 1 MG tablet Take 1 tablet (1 mg total) by mouth 2 (two) times daily. 60 tablet 0  . Calcium-Phosphorus-Vitamin D (CITRACAL +D3 PO) Take 1,200 mcg by mouth.    . cholecalciferol (VITAMIN D) 1000 units tablet Take 1,000 Units by mouth daily.    Marland Kitchen estradiol (VIVELLE-DOT) 0.05 MG/24HR patch     . gabapentin (NEURONTIN) 100 MG capsule Take 1 capsule (100 mg total) by mouth daily. 90 capsule 3  . HYDROcodone-acetaminophen (NORCO/VICODIN) 5-325 MG tablet Take 1 tablet by mouth every 6 (six) hours as needed for moderate pain or severe pain. 30 tablet 0  . Multiple Vitamin (MULTIVITAMIN) capsule Take 1 capsule by mouth daily.    . potassium chloride SA (K-DUR) 20 MEQ tablet Take 1 tablet (20 mEq total) by mouth daily. 90 tablet 1  . spironolactone (ALDACTONE) 25 MG tablet Take 0.5 tablets (12.5 mg total) by mouth daily. 45 tablet 3   No current facility-administered medications on file prior to visit.     PAST MEDICAL HISTORY: Past Medical History:  Diagnosis Date  . Cancer ([facility])    left breast  . Chronic bronchitis  ([facility])   . Family history of breast cancer   . Family history of ovarian cancer   . Family history of prostate cancer   . GERD (gastroesophageal reflux disease)    due to chemotherapy  . Lactose intolerance   . PONV (postoperative nausea and vomiting)     PAST SURGICAL HISTORY: Past Surgical History:  Procedure Laterality Date  . BREAST LUMPECTOMY WITH RADIOACTIVE SEED AND AXILLARY LYMPH NODE DISSECTION Left 07/12/2018   Procedure: LEFT BREAST RADIOACTIVE SEED X 3 LUMPECTOMY X 3  AND AXILLARY LYMPH NODE DISSECTION;  Surgeon: Jovita Kussmaul, MD;  Location: [facility];  Service: General;  Laterality: Left;  . PORTACATH PLACEMENT Right 03/03/2018   Procedure: INSERTION PORT-A-CATH;  Surgeon: Jovita Kussmaul, MD;  Location: [facility];  Service: General;  Laterality: Right;  . ROTATOR CUFF REPAIR Right 2006  . TOTAL ABDOMINAL HYSTERECTOMY  2010    SOCIAL HISTORY: Social History   Tobacco Use  . Smoking status: Never Smoker  . Smokeless tobacco: Never Used  Substance Use Topics  . Alcohol use: No    Comment: quit long ago  . Drug use: No  FAMILY HISTORY: Family History  Problem Relation Age of Onset  . High blood pressure Mother   . Heart attack Mother        CHF  . High blood pressure Father   . Prostate cancer Father        dx mid 32s  . Breast cancer Sister 29       negative Invitae 46 gene panel  . Breast cancer Paternal Aunt        dx mid 30s  . Cancer Paternal Uncle        jaw cancer d. 74s  . Ovarian cancer Maternal Grandmother        d. 38s  . Cancer Paternal Grandmother        either stomach or ovarian cancer  . Breast cancer Sister 92       "negative 21 gene panel"   ROS: Review of Systems  Gastrointestinal: Negative for nausea and vomiting.  Musculoskeletal:       Negative for muscle weakness.  Endo/Heme/Allergies:       Negative for polyphagia.   PHYSICAL EXAM: Blood pressure 121/66, pulse 67, temperature 98.3 F  (36.8 C), temperature source Oral, height 5\' 5"  (1.651 m), weight 168 lb (76.2 kg), SpO2 100 %. Body mass index is 27.96 kg/m. Physical Exam Vitals signs reviewed.  Constitutional:      Appearance: Normal appearance. She is obese.  Cardiovascular:     Rate and Rhythm: Normal rate.     Pulses: Normal pulses.  Pulmonary:     Effort: Pulmonary effort is normal.     Breath sounds: Normal breath sounds.  Musculoskeletal: Normal range of motion.  Skin:    General: Skin is warm and dry.  Neurological:     Mental Status: She is alert and oriented to person, place, and time.  Psychiatric:        Behavior: Behavior normal.   RECENT LABS AND TESTS: BMET    Component Value Date/Time   NA 140 08/20/2018 0845   NA 138 12/22/2017 1235   K 3.6 08/20/2018 0845   CL 101 08/20/2018 0845   CO2 28 08/20/2018 0845   GLUCOSE 83 08/20/2018 0845   BUN 27 (H) 08/20/2018 0845   BUN 12 12/22/2017 1235   CREATININE 1.27 (H) 08/20/2018 0845   CALCIUM 9.0 08/20/2018 0845   GFRNONAA 46 (L) 08/20/2018 0845   GFRAA 53 (L) 08/20/2018 0845   Lab Results  Component Value Date   HGBA1C 5.6 05/31/2018   HGBA1C 6.1 (H) 12/22/2017   Lab Results  Component Value Date   INSULIN 4.8 05/31/2018   INSULIN 13.7 12/22/2017   CBC    Component Value Date/Time   WBC 3.9 (L) 08/20/2018 0845   RBC 3.05 (L) 08/20/2018 0845   HGB 9.4 (L) 08/20/2018 0845   HGB 9.7 (L) 03/26/2018 0859   HGB 12.3 12/22/2017 1235   HCT 30.1 (L) 08/20/2018 0845   HCT 38.2 12/22/2017 1235   PLT 154 08/20/2018 0845   PLT 306 03/26/2018 0859   MCV 98.7 08/20/2018 0845   MCV 92 12/22/2017 1235   MCH 30.8 08/20/2018 0845   MCHC 31.2 08/20/2018 0845   RDW 15.1 08/20/2018 0845   RDW 14.8 12/22/2017 1235   LYMPHSABS 1.6 08/20/2018 0845   LYMPHSABS 2.0 12/22/2017 1235   MONOABS 0.3 08/20/2018 0845   EOSABS 0.1 08/20/2018 0845   EOSABS 0.0 12/22/2017 1235   BASOSABS 0.0 08/20/2018 0845   BASOSABS 0.0 12/22/2017 1235  Iron/TIBC/Ferritin/ %Sat No results found for: IRON, TIBC, FERRITIN, IRONPCTSAT Lipid Panel     Component Value Date/Time   CHOL 176 12/22/2017 1235   TRIG 111 12/22/2017 1235   HDL 61 12/22/2017 1235   LDLCALC 93 12/22/2017 1235   Hepatic Function Panel     Component Value Date/Time   PROT 7.6 08/20/2018 0845   PROT 7.6 12/22/2017 1235   ALBUMIN 4.0 08/20/2018 0845   ALBUMIN 4.8 12/22/2017 1235   AST 20 08/20/2018 0845   ALT 12 08/20/2018 0845   ALKPHOS 103 08/20/2018 0845   BILITOT 0.6 08/20/2018 0845      Component Value Date/Time   TSH 3.644 03/10/2018 0538   TSH 1.930 12/22/2017 1235   Results for [place], Cyera D (MRN [id number]) as of 10/20/2018 15:12  Ref. Range 12/22/2017 12:35  Vitamin D, 25-Hydroxy Latest Ref Range: 30.0 - 100.0 ng/mL 41.9   OBESITY BEHAVIORAL INTERVENTION VISIT  Today's visit was #12  Starting weight: 195 lbs Starting date: 12/22/2017 Today's weight: 168 lbs  Today's date: 10/20/2018 Total lbs lost to date: 27   10/20/2018  Height 5\' 5"  (1.651 m)  Weight 168 lb (76.2 kg)  BMI (Calculated) 27.96  BLOOD PRESSURE - SYSTOLIC 416  BLOOD PRESSURE - DIASTOLIC 66   Body Fat % 60.6 %  Total Body Water (lbs) 79.4 lbs   ASK: We discussed the diagnosis of obesity with Christy Serrano today and Christy Serrano agreed to give [place] permission to discuss obesity behavioral modification therapy today.  ASSESS: Christy Serrano has the diagnosis of obesity and her BMI today is 28.1. Christy Serrano is in the action stage of change.   ADVISE: Christy Serrano was educated on the multiple health risks of obesity as well as the benefit of weight loss to improve her health. She was advised of the need for long term treatment and the importance of lifestyle modifications to improve her current health and to decrease her risk of future health problems.  AGREE: Multiple dietary modification options and treatment options were discussed and  Christy Serrano agreed to follow the recommendations  documented in the above note.  ARRANGE: Christy Serrano was educated on the importance of frequent visits to treat obesity as outlined per CMS and USPSTF guidelines and agreed to schedule her next follow up appointment today.  Christy Serrano, am acting as Location manager for [employer], DO  I have reviewed the above documentation for accuracy and completeness, and I agree with the above. -Jearld Lesch, DO

## 2018-10-21 ENCOUNTER — Other Ambulatory Visit: Payer: Self-pay

## 2018-10-21 DIAGNOSIS — C50412 Malignant neoplasm of upper-outer quadrant of left female breast: Secondary | ICD-10-CM

## 2018-10-21 DIAGNOSIS — Z171 Estrogen receptor negative status [ER-]: Secondary | ICD-10-CM

## 2018-10-21 LAB — HEMOGLOBIN A1C
Est. average glucose Bld gHb Est-mCnc: 117 mg/dL
Hgb A1c MFr Bld: 5.7 % — ABNORMAL HIGH (ref 4.8–5.6)

## 2018-10-21 LAB — VITAMIN D 25 HYDROXY (VIT D DEFICIENCY, FRACTURES): Vit D, 25-Hydroxy: 46.6 ng/mL (ref 30.0–100.0)

## 2018-10-22 ENCOUNTER — Inpatient Hospital Stay: Payer: 59

## 2018-10-22 ENCOUNTER — Other Ambulatory Visit: Payer: Self-pay

## 2018-10-22 VITALS — BP 128/62 | HR 63 | Temp 98.7°F | Resp 20

## 2018-10-22 DIAGNOSIS — C50412 Malignant neoplasm of upper-outer quadrant of left female breast: Secondary | ICD-10-CM

## 2018-10-22 DIAGNOSIS — Z171 Estrogen receptor negative status [ER-]: Secondary | ICD-10-CM

## 2018-10-22 DIAGNOSIS — Z95828 Presence of other vascular implants and grafts: Secondary | ICD-10-CM

## 2018-10-22 DIAGNOSIS — Z510 Encounter for antineoplastic radiation therapy: Secondary | ICD-10-CM | POA: Diagnosis not present

## 2018-10-22 LAB — CBC WITH DIFFERENTIAL (CANCER CENTER ONLY)
Abs Immature Granulocytes: 0.01 10*3/uL (ref 0.00–0.07)
Basophils Absolute: 0 10*3/uL (ref 0.0–0.1)
Basophils Relative: 0 %
Eosinophils Absolute: 0.1 10*3/uL (ref 0.0–0.5)
Eosinophils Relative: 2 %
HCT: 31.6 % — ABNORMAL LOW (ref 36.0–46.0)
Hemoglobin: 10.1 g/dL — ABNORMAL LOW (ref 12.0–15.0)
Immature Granulocytes: 0 %
Lymphocytes Relative: 25 %
Lymphs Abs: 0.8 10*3/uL (ref 0.7–4.0)
MCH: 29.6 pg (ref 26.0–34.0)
MCHC: 32 g/dL (ref 30.0–36.0)
MCV: 92.7 fL (ref 80.0–100.0)
Monocytes Absolute: 0.3 10*3/uL (ref 0.1–1.0)
Monocytes Relative: 10 %
Neutro Abs: 2 10*3/uL (ref 1.7–7.7)
Neutrophils Relative %: 63 %
Platelet Count: 163 10*3/uL (ref 150–400)
RBC: 3.41 MIL/uL — ABNORMAL LOW (ref 3.87–5.11)
RDW: 15.3 % (ref 11.5–15.5)
WBC Count: 3.2 10*3/uL — ABNORMAL LOW (ref 4.0–10.5)
nRBC: 0 % (ref 0.0–0.2)

## 2018-10-22 LAB — CMP (CANCER CENTER ONLY)
ALT: 12 U/L (ref 0–44)
AST: 16 U/L (ref 15–41)
Albumin: 3.9 g/dL (ref 3.5–5.0)
Alkaline Phosphatase: 87 U/L (ref 38–126)
Anion gap: 6 (ref 5–15)
BUN: 16 mg/dL (ref 8–23)
CO2: 28 mmol/L (ref 22–32)
Calcium: 9.7 mg/dL (ref 8.9–10.3)
Chloride: 106 mmol/L (ref 98–111)
Creatinine: 1.01 mg/dL — ABNORMAL HIGH (ref 0.44–1.00)
GFR, Est AFR Am: >60 mL/min (ref >60)
GFR, Estimated: >60 mL/min (ref >60)
Glucose, Bld: 85 mg/dL (ref 70–99)
Potassium: 3.9 mmol/L (ref 3.5–5.1)
Sodium: 140 mmol/L (ref 135–145)
Total Bilirubin: 0.6 mg/dL (ref 0.3–1.2)
Total Protein: 7.4 g/dL (ref 6.5–8.1)

## 2018-10-22 MED ORDER — DIPHENHYDRAMINE HCL 25 MG PO CAPS
50.0000 mg | ORAL_CAPSULE | Freq: Once | ORAL | Status: AC
  Administered 2018-10-22: 50 mg via ORAL

## 2018-10-22 MED ORDER — DIPHENHYDRAMINE HCL 25 MG PO CAPS
ORAL_CAPSULE | ORAL | Status: AC
  Filled 2018-10-22: qty 1

## 2018-10-22 MED ORDER — SODIUM CHLORIDE 0.9% FLUSH
10.0000 mL | INTRAVENOUS | Status: DC | PRN
  Administered 2018-10-22: 10 mL
  Filled 2018-10-22: qty 10

## 2018-10-22 MED ORDER — TRASTUZUMAB CHEMO 150 MG IV SOLR
450.0000 mg | Freq: Once | INTRAVENOUS | Status: AC
  Administered 2018-10-22: 450 mg via INTRAVENOUS
  Filled 2018-10-22: qty 21.43

## 2018-10-22 MED ORDER — SODIUM CHLORIDE 0.9 % IV SOLN
Freq: Once | INTRAVENOUS | Status: AC
  Administered 2018-10-22: 09:00:00 via INTRAVENOUS
  Filled 2018-10-22: qty 250

## 2018-10-22 MED ORDER — ACETAMINOPHEN 325 MG PO TABS
650.0000 mg | ORAL_TABLET | Freq: Once | ORAL | Status: AC
  Administered 2018-10-22: 650 mg via ORAL

## 2018-10-22 MED ORDER — HEPARIN SOD (PORK) LOCK FLUSH 100 UNIT/ML IV SOLN
500.0000 [IU] | Freq: Once | INTRAVENOUS | Status: AC | PRN
  Administered 2018-10-22: 500 [IU]
  Filled 2018-10-22: qty 5

## 2018-10-22 MED ORDER — SODIUM CHLORIDE 0.9 % IV SOLN
420.0000 mg | Freq: Once | INTRAVENOUS | Status: AC
  Administered 2018-10-22: 420 mg via INTRAVENOUS
  Filled 2018-10-22: qty 14

## 2018-10-22 MED ORDER — SODIUM CHLORIDE 0.9% FLUSH
10.0000 mL | INTRAVENOUS | Status: DC | PRN
  Administered 2018-10-22: 09:00:00 10 mL
  Filled 2018-10-22: qty 10

## 2018-10-22 NOTE — Patient Instructions (Signed)
[facility] Discharge Instructions for Patients Receiving Chemotherapy  Today you received the following chemotherapy agents: Herceptin, Perjeta  To help prevent nausea and vomiting after your treatment, we encourage you to take your nausea medication as directed.    If you develop nausea and vomiting that is not controlled by your nausea medication, call the clinic.   BELOW ARE SYMPTOMS THAT SHOULD BE REPORTED IMMEDIATELY:  *FEVER GREATER THAN 100.5 F  *CHILLS WITH OR WITHOUT FEVER  NAUSEA AND VOMITING THAT IS NOT CONTROLLED WITH YOUR NAUSEA MEDICATION  *UNUSUAL SHORTNESS OF BREATH  *UNUSUAL BRUISING OR BLEEDING  TENDERNESS IN MOUTH AND THROAT WITH OR WITHOUT PRESENCE OF ULCERS  *URINARY PROBLEMS  *BOWEL PROBLEMS  UNUSUAL RASH Items with * indicate a potential emergency and should be followed up as soon as possible.  Feel free to call the clinic should you have any questions or concerns. The clinic phone number is [phone].  Please show the [facility] at check-in to the Emergency Department and triage nurse.  Coronavirus (COVID-19) Are you at risk?  Are you at risk for the Coronavirus (COVID-19)?  To be considered HIGH RISK for Coronavirus (COVID-19), you have to meet the following criteria:  . Traveled to [place], [place], [place], [place] or [place]; or in the [place] to [place], [place], [place], or [place]; and have fever, cough, and shortness of breath within the last 2 weeks of travel OR . Been in close contact with a person diagnosed with COVID-19 within the last 2 weeks and have fever, cough, and shortness of breath . IF YOU DO NOT MEET THESE CRITERIA, YOU ARE CONSIDERED LOW RISK FOR COVID-19.  What to do if you are HIGH RISK for COVID-19?  Marland Kitchen If you are having a medical emergency, call 911. . Seek medical care right away. Before you go to a doctor's office, urgent care or emergency department, call ahead and tell them about  your recent travel, contact with someone diagnosed with COVID-19, and your symptoms. You should receive instructions from your physician's office regarding next steps of care.  . When you arrive at healthcare provider, tell the healthcare staff immediately you have returned from visiting [place], [place], [place], [place] or [place]; or traveled in the [place] to [place], [place], [place], or [place]; in the last two weeks or you have been in close contact with a person diagnosed with COVID-19 in the last 2 weeks.   . Tell the health care staff about your symptoms: fever, cough and shortness of breath. . After you have been seen by a medical provider, you will be either: o Tested for (COVID-19) and discharged home on quarantine except to seek medical care if symptoms worsen, and asked to  - Stay home and avoid contact with others until you get your results (4-5 days)  - Avoid travel on public transportation if possible (such as bus, train, or airplane) or o Sent to the Emergency Department by EMS for evaluation, COVID-19 testing, and possible admission depending on your condition and test results.  What to do if you are LOW RISK for COVID-19?  Reduce your risk of any infection by using the same precautions used for avoiding the common cold or flu:  Marland Kitchen Wash your hands often with soap and warm water for at least 20 seconds.  If soap and water are not readily available, use an alcohol-based hand sanitizer with at least 60% alcohol.  . If coughing  or sneezing, cover your mouth and nose by coughing or sneezing into the elbow areas of your shirt or coat, into a tissue or into your sleeve (not your hands). . Avoid shaking hands with others and consider head nods or verbal greetings only. . Avoid touching your eyes, nose, or mouth with unwashed hands.  . Avoid close contact with people who are sick. . Avoid places or events with large numbers of people in one location, like concerts or sporting  events. . Carefully consider travel plans you have or are making. . If you are planning any travel outside or inside the [place], visit the CDC's Travelers' Health webpage for the latest health notices. . If you have some symptoms but not all symptoms, continue to monitor at home and seek medical attention if your symptoms worsen. . If you are having a medical emergency, call 911.   ADDITIONAL HEALTHCARE OPTIONS FOR PATIENTS  [facility] / e-Visit: [email or web address]         [facility]: [phone]  [facility]: [phone]                   [facility]: [phone]    

## 2018-10-24 NOTE — Progress Notes (Signed)
The following biosimilar Ogivri ([id number]) has been selected for use in this patient.  Kennith Center, Pharm.D., CPP 10/24/2018@9 :09 AM

## 2018-10-28 ENCOUNTER — Encounter: Payer: Self-pay | Admitting: Adult Health

## 2018-11-02 ENCOUNTER — Ambulatory Visit
Admission: RE | Admit: 2018-11-02 | Discharge: 2018-11-02 | Disposition: A | Discharge status: Home / Self Care | Payer: 59 | Source: Ambulatory Visit | Attending: Orthopedic Surgery | Admitting: Orthopedic Surgery

## 2018-11-02 ENCOUNTER — Other Ambulatory Visit: Payer: Self-pay

## 2018-11-02 DIAGNOSIS — M25512 Pain in left shoulder: Secondary | ICD-10-CM

## 2018-11-02 MED ORDER — IOPAMIDOL (ISOVUE-M 200) INJECTION 41%
15.0000 mL | Freq: Once | INTRAMUSCULAR | Status: AC
  Administered 2018-11-02: 15 mL via INTRA_ARTICULAR

## 2018-11-04 NOTE — Progress Notes (Signed)
  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  Cancer Staging Carcinoma of upper-outer quadrant of left breast in female, estrogen receptor negative ([place]) Staging form: Breast, AJCC 8th Edition - Pathologic: No Stage Recommended (ypT0, pN0) - Signed by Eppie Gibson, MD on 08/11/2018 - Clinical: Stage IIIA (cT3, cN1, cM0, G3, ER-, PR-, HER2+) - Signed by Eppie Gibson, MD on 08/11/2018  Malignant neoplasm of left female breast [facility]) Staging form: Breast, AJCC 8th Edition - Clinical: Stage IIIA (cT3, cN1, cM0, G3, ER-, PR-, HER2+) - Signed by Nicholas Lose, MD on 02/03/2018  Intent: Curative  Radiation Treatment Dates: 09/02/2018 through 10/14/2018 Site Technique Total Dose (Gy) Dose per Fx Completed Fx Beam Energies  Breast: Breast_Lt 3D 50/50 2 25/25 6X, 10X  Breast: Breast_Lt_SCV_PAB 3D 50/50 2 25/25 6X, 10X  Breast: Breast_Lt_Bst 3D 10/10 2 5/5 6X, 10X   Narrative: The patient tolerated radiation therapy relatively well. She had some expected skin irritation as she progressed through treatment with hyperpigmentation over her left breast, axillary region, and upper back. There are also small areas of dry desquamation over the left upper back. She is applying Radiaplex gel to her skin as ordered. She also noted some breast pain and nipple sensitivity.  Plan: The patient will follow-up with radiation oncology in one month.  ________________________________________________  Eppie Gibson, MD  This document serves as a record of services  personally performed by Eppie Gibson, MD. It was created on her behalf by Rae Lips, a trained medical scribe. The creation of this record is based on the scribe's personal observations and the provider's statements to them. This document has been checked and approved by the attending provider.

## 2018-11-05 ENCOUNTER — Encounter: Payer: Self-pay | Admitting: Orthopedic Surgery

## 2018-11-05 ENCOUNTER — Ambulatory Visit (INDEPENDENT_AMBULATORY_CARE_PROVIDER_SITE_OTHER): Payer: 59 | Admitting: Orthopedic Surgery

## 2018-11-05 VITALS — Ht 65.0 in | Wt 168.0 lb

## 2018-11-05 DIAGNOSIS — M75112 Incomplete rotator cuff tear or rupture of left shoulder, not specified as traumatic: Secondary | ICD-10-CM

## 2018-11-05 NOTE — Progress Notes (Signed)
Office Visit Note   Patient: Christy Serrano           Date of Birth: 14-Nov-1955           MRN: [id number] Visit Date: 11/05/2018 Requested by: Velna Hatchet, MD [address] [place],  [place] [address] PCP: Velna Hatchet, MD  Subjective: Chief Complaint  Patient presents with  . Left Shoulder - Follow-up    HPI: [name] is a patient with left shoulder pain.  Since have seen her she is had an MRI scan.  That does show high-grade partial-thickness bursal sided rotator cuff tear of the supraspinatus.  Labrum is intact and there is no cancer or labral pathology visualized.  She has been to therapy in the past.  She does not do too much in terms of high-grade activity with that left shoulder              ROS: All systems reviewed are negative as they relate to the chief complaint within the history of present illness.  Patient denies  fevers or chills.   Assessment & Plan: Visit Diagnoses:  1. Nontraumatic incomplete tear of left rotator cuff     Plan: Impression is left shoulder high-grade bursal sided tear of the supraspinatus.  He can really feel that along the anterior aspect of the acromion with internal and external rotation.  Not much in terms of range of motion deficit or strength deficit but she does have episodic pain at times.  This is something she wants to live with.  I think that this may progress in the future and I described symptoms that would cause that.  For now we will observe.  I will see her back as needed.  Follow-Up Instructions: Return if symptoms worsen or fail to improve.   Orders:  No orders of the defined types were placed in this encounter.  No orders of the defined types were placed in this encounter.     Procedures: No procedures performed   Clinical Data: No additional findings.  Objective: Vital Signs: Ht 5\' 5"  (1.651 m)   Wt 168 lb (76.2 kg)   BMI 27.96 kg/m   Physical Exam:   Constitutional: Patient appears well-developed  HEENT:  Head: Normocephalic Eyes:EOM are normal Neck: Normal range of motion Cardiovascular: Normal rate Pulmonary/chest: Effort normal Neurologic: Patient is alert Skin: Skin is warm Psychiatric: Patient has normal mood and affect    Ortho Exam: Ortho exam demonstrates maintained passive range of motion.  No real strength deficit infraspinatus supraspinatus and subscap muscle testing.  She does have a little bit of coarseness with internal/external rotation which is palpable off the anterior lateral aspect of the acromion.  Motor sensory function of the hand is intact.  Not too much edema in that left arm versus right  Specialty Comments:  No specialty comments available.  Imaging: No results found.   PMFS History: Patient Active Problem List   Diagnosis Date Noted  . Carcinoma of upper-outer quadrant of left breast in female, estrogen receptor negative ([place]) 07/12/2018  . Seizure-like activity ([place])   . Hyponatremia   . Recurrent syncope 03/08/2018  . LFT elevation   . Seizure ([place])   . Thrombocytopenia ([place])   . Port-A-Cath in place 03/04/2018  . Genetic testing 02/23/2018  . Family history of breast cancer   . Family history of prostate cancer   . Family history of ovarian cancer   . Malignant neoplasm of left female breast ([place]) 02/03/2018  . Prediabetes  01/06/2018  . Reactive airway disease without complication 09/32/6712  . Class 1 obesity with serious comorbidity and body mass index (BMI) of 32.0 to 32.9 in adult 12/22/2017   Past Medical History:  Diagnosis Date  . Cancer ([place])    left breast  . Chronic bronchitis ([place])   . Family history of breast cancer   . Family history of ovarian cancer   . Family history of prostate cancer   . GERD (gastroesophageal reflux disease)    due to chemotherapy  . Lactose intolerance   . PONV (postoperative nausea and vomiting)     Family History  Problem Relation Age of Onset  . High blood pressure Mother   . Heart  attack Mother        CHF  . High blood pressure Father   . Prostate cancer Father        dx mid 34s  . Breast cancer Sister 6       negative Invitae 46 gene panel  . Breast cancer Paternal Aunt        dx mid 17s  . Cancer Paternal Uncle        jaw cancer d. 71s  . Ovarian cancer Maternal Grandmother        d. 43s  . Cancer Paternal Grandmother        either stomach or ovarian cancer  . Breast cancer Sister 48       "negative 21 gene panel"    Past Surgical History:  Procedure Laterality Date  . BREAST LUMPECTOMY WITH RADIOACTIVE SEED AND AXILLARY LYMPH NODE DISSECTION Left 07/12/2018   Procedure: LEFT BREAST RADIOACTIVE SEED X 3 LUMPECTOMY X 3  AND AXILLARY LYMPH NODE DISSECTION;  Surgeon: Jovita Kussmaul, MD;  Location: [facility];  Service: General;  Laterality: Left;  . PORTACATH PLACEMENT Right 03/03/2018   Procedure: INSERTION PORT-A-CATH;  Surgeon: Jovita Kussmaul, MD;  Location: [facility];  Service: General;  Laterality: Right;  . ROTATOR CUFF REPAIR Right 2006  . TOTAL ABDOMINAL HYSTERECTOMY  2010   Social History   Occupational History  . Occupation: Quarry manager    Comment: [employer]  Tobacco Use  . Smoking status: Never Smoker  . Smokeless tobacco: Never Used  Substance and Sexual Activity  . Alcohol use: No    Comment: quit long ago  . Drug use: No  . Sexual activity: Not on file

## 2018-11-05 NOTE — Assessment & Plan Note (Signed)
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, grade 3, ER 0%, PR 0%, Ki-67 30%, HER-2 +3+ by IHC; hypoechoic mass 2 o'clock position 1.2 x 1.3 x 1.7 cm biopsy IDC, LV I present, grade 2-3, ER 0%, PR 0%, Ki-67 40%, HER-2 +3+, left axillary lymph node positive (3 nodes noted by [place]) T3 N1 stage IIIA  Treatment plan 1.Neoadjuvant chemotherapy with [place] completed 3/26/2020followed by Herceptin Perjeta maintenance  2.lumpectomywith axillary lymphnode dissection: 07/12/2018 3.Adjuvant radiation therapy --------------------------------------------------------------------------------------------------------------------------------------------------- 07/12/2018:Lumpectomy (Dr. Marlou Starks): complete treatment response, no residual carcinoma present in breast or 11/11 lymph nodes.  Treatment plan: 1.Adjuvant radiation therapy: Slightly delayed because of COVID-19.  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    Lower extremity edema: On Bumex, much improved Patient will come back every 3 weeks for Herceptin Perjeta and every 6 weeks for follow-up with me.

## 2018-11-11 NOTE — Progress Notes (Signed)
Patient Care Team: Velna Hatchet, MD as PCP - General (Internal Medicine) Jovita Kussmaul, MD as Consulting Physician (General Surgery) Nicholas Lose, MD as Consulting Physician (Hematology and Oncology) Eppie Gibson, MD as Attending Physician (Radiation Oncology)  DIAGNOSIS:    ICD-10-CM   1. Malignant neoplasm of upper-outer quadrant of left breast in female, estrogen receptor negative ([place])  C50.412    Z17.1     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 breast ([place])  01/18/2018 Initial Diagnosis   Screening mammogram detected microcalcifications and asymmetry, left breast segmental microcalcifications UOQ 1.5 x 4.3 x 5.4 cm biopsy-proven IDC with DCIS with LV I, grade 3, ER 0%, PR 0%, Ki-67 30%, HER-2 +3+ by IHC; hypoechoic mass 2 o'clock position 1.2 x 1.3 x 1.7 cm biopsy IDC, LV I present, grade 2-3, ER 0%, PR 0%, Ki-67 40%, HER-2 +3+, left axillary lymph node positive (3 nodes noted by [place]) T3 N1 stage IIIA   02/03/2018 Cancer Staging   Staging form: Breast, AJCC 8th Edition - Clinical: Stage IIIA (cT3, cN1, cM0, G3, ER-, PR-, HER2+) - Signed by Nicholas Lose, MD on 02/03/2018   02/20/2018 Genetic Testing   No pathogenic variants identified on the [facility] CancerNext Expanded + RNA insight panel. Genes Analyzed (67 total): AIP, ALK, APC*, ATM*, BAP1, BARD1, BLM, BMPR1A, BRCA1*, BRCA2*, BRIP1*, CDH1*, CDK4, CDKN1B, CDKN2A, CHEK2*, DICER1, FANCC, FH, FLCN, GALNT12, HOXB13, MAX, MEN1, MET, MLH1*, MRE11A, MSH2*, MSH6*, MUTYH*, NBN, NF1*, NF2, PALB2*, PHOX2B, PMS2*, POLD1, POLE, POT1, PRKAR1A, PTCH1, PTEN*, RAD50, RAD51C*, RAD51D*, RB1, RET, SDHA, SDHAF2, SDHB, SDHC, SDHD, SMAD4, SMARCA4, SMARCB1, SMARCE1, STK11, SUFU, TMEM127, TP53*, TSC1, TSC2, VHL and XRCC2 (sequencing and deletion/duplication); MITF (sequencing only); EPCAM and GREM1 (deletion/duplication only). DNA and RNA analyses performed for * genes. The report date is 02/20/2018.    03/04/2018 -  Neo-Adjuvant Chemotherapy   Neoadjuvant chemotherapy with [facility] Perjeta   03/08/2018 - 03/10/2018 Hospital Admission   Admitted for recurrent syncope with loss of consciousness for up to 5 minutes with loss of bladder, no abnormalities identified.  MRI brain, carotid ultrasound, EEG studies were negative.   06/17/2018 Breast MRI   No suspicious residual enhancement identified in the left breast, compatible with treatment response, interval decrease in size of left axillary lymph nodes   07/12/2018 Surgery   Lumpectomy (Dr. Marlou Starks): complete treatment response, no residual carcinoma present in breast or 11/11 lymph nodes.    09/02/2018 - 10/14/2018 Radiation Therapy   Adjuvant XRT   Carcinoma of upper-outer quadrant of left breast in female, estrogen receptor negative ([place])  07/12/2018 Initial Diagnosis   Carcinoma of upper-outer quadrant of left breast in female, estrogen receptor negative ([place])   08/11/2018 Cancer Staging   Staging form: Breast, AJCC 8th Edition - Pathologic: No Stage Recommended (ypT0, pN0) - Signed by Eppie Gibson, MD on 08/11/2018   08/11/2018 Cancer Staging   Staging form: Breast, AJCC 8th Edition - Clinical: Stage IIIA (cT3, cN1, cM0, G3, ER-, PR-, HER2+) - Signed by Eppie Gibson, MD on 08/11/2018   08/20/2018 -  Chemotherapy   The patient had trastuzumab (HERCEPTIN) 450 mg in sodium chloride 0.9 % 250 mL chemo infusion, 462 mg (100 % of original dose 6 mg/kg), Intravenous,  Once, 4 of 4 cycles Dose modification: 6 mg/kg (original dose 6 mg/kg, Cycle 1, Reason: Provider Judgment) Administration: 450 mg (08/20/2018), 450 mg (09/10/2018), 450 mg (10/01/2018), 450 mg (10/22/2018) pertuzumab (PERJETA) 420 mg in sodium chloride 0.9 % 250 mL  chemo infusion, 420 mg (100 % of original dose 420 mg), Intravenous, Once, 4 of 9 cycles Dose modification: 420 mg (original dose 420 mg, Cycle 1, Reason: Provider Judgment) Administration: 420 mg (08/20/2018), 420 mg (09/10/2018),  420 mg (10/01/2018), 420 mg (10/22/2018) trastuzumab-dkst (OGIVRI) 450 mg in sodium chloride 0.9 % 250 mL chemo infusion, 450 mg (100 % of original dose 450 mg), Intravenous,  Once, 0 of 5 cycles Dose modification: 450 mg (original dose 450 mg, Cycle 5, Reason: Other (see comments), Comment: Biosimilar Conversion)  for chemotherapy treatment.      CHIEF COMPLIANT: Follow-up on Herceptin Perjeta maintenance   INTERVAL HISTORY: Christy Serrano is a 63 y.o. with above-mentioned history of left breast cancer treated with neoadjuvant chemotherapy, lumpectomy, radiation, and who is currently undergoing Herceptin Perjeta maintenance.She presents to the clinic todayfor treatment.   Energy levels are coming back, nails are improving.  Hair is also coming.  Recent problems with her left shoulder with a tear in the shoulder noted on MRI.  She is going to get steroid injection in the shoulder.  REVIEW OF SYSTEMS:   Constitutional: Denies fevers, chills or abnormal weight loss Eyes: Denies blurriness of vision Ears, nose, mouth, throat, and face: Denies mucositis or sore throat Respiratory: Denies cough, dyspnea or wheezes Cardiovascular: Denies palpitation, chest discomfort Gastrointestinal: Denies nausea, heartburn or change in bowel habits Skin: Denies abnormal skin rashes Lymphatics: Denies new lymphadenopathy or easy bruising Neurological: Denies numbness, tingling or new weaknesses Behavioral/Psych: Mood is stable, no new changes  Extremities: No lower extremity edema, left shoulder pain Breast: denies any pain or lumps or nodules in either breasts All other systems were reviewed with the patient and are negative.  I have reviewed the past medical history, past surgical history, social history and family history with the patient and they are unchanged from previous note.  ALLERGIES:  is allergic to oxycodone-acetaminophen; gabapentin; and amoxicillin.  MEDICATIONS:  Current Outpatient  Medications  Medication Sig Dispense Refill  . acetaminophen (TYLENOL) 500 MG tablet Take 1,000 mg by mouth every 6 (six) hours as needed for mild pain.    Marland Kitchen albuterol (PROVENTIL HFA;VENTOLIN HFA) 108 (90 Base) MCG/ACT inhaler Inhale into the lungs every 6 (six) hours as needed for wheezing or shortness of breath.    . budesonide-formoterol (SYMBICORT) 160-4.5 MCG/ACT inhaler Inhale 2 puffs into the lungs 2 (two) times daily.    . bumetanide (BUMEX) 1 MG tablet Take 1 tablet (1 mg total) by mouth 2 (two) times daily. 60 tablet 0  . Calcium-Phosphorus-Vitamin D (CITRACAL +D3 PO) Take 1,200 mcg by mouth.    . cholecalciferol (VITAMIN D) 1000 units tablet Take 1,000 Units by mouth daily.    Marland Kitchen estradiol (VIVELLE-DOT) 0.05 MG/24HR patch     . gabapentin (NEURONTIN) 100 MG capsule Take 1 capsule (100 mg total) by mouth daily. 90 capsule 3  . HYDROcodone-acetaminophen (NORCO/VICODIN) 5-325 MG tablet Take 1 tablet by mouth every 6 (six) hours as needed for moderate pain or severe pain. 30 tablet 0  . Multiple Vitamin (MULTIVITAMIN) capsule Take 1 capsule by mouth daily.    . potassium chloride SA (K-DUR) 20 MEQ tablet Take 1 tablet (20 mEq total) by mouth daily. 90 tablet 1  . spironolactone (ALDACTONE) 25 MG tablet Take 0.5 tablets (12.5 mg total) by mouth daily. 45 tablet 3   No current facility-administered medications for this visit.     PHYSICAL EXAMINATION: ECOG PERFORMANCE STATUS: 1 - Symptomatic but completely ambulatory  Vitals:  11/12/18 0834  BP: (!) 145/70  Pulse: 72  Resp: 18  Temp: 98.3 F (36.8 C)  SpO2: 100%   Filed Weights   11/12/18 0834  Weight: 173 lb 6.4 oz (78.7 kg)    GENERAL: alert, no distress and comfortable SKIN: skin color, texture, turgor are normal, no rashes or significant lesions EYES: normal, Conjunctiva are pink and non-injected, sclera clear OROPHARYNX: no exudate, no erythema and lips, buccal mucosa, and tongue normal  NECK: supple, thyroid normal  size, non-tender, without nodularity LYMPH: no palpable lymphadenopathy in the cervical, axillary or inguinal LUNGS: clear to auscultation and percussion with normal breathing effort HEART: regular rate & rhythm and no murmurs and no lower extremity edema ABDOMEN: abdomen soft, non-tender and normal bowel sounds MUSCULOSKELETAL: no cyanosis of digits and no clubbing  NEURO: alert & oriented x 3 with fluent speech, no focal motor/sensory deficits EXTREMITIES: No lower extremity edema  LABORATORY DATA:  I have reviewed the data as listed CMP Latest Ref Rng & Units 10/22/2018 08/20/2018 06/17/2018  Glucose 70 - 99 mg/dL 85 83 92  BUN 8 - 23 mg/dL 16 27(H) 12  Creatinine 0.44 - 1.00 mg/dL 1.01(H) 1.27(H) 0.82  Sodium 135 - 145 mmol/L 140 140 139  Potassium 3.5 - 5.1 mmol/L 3.9 3.6 3.9  Chloride 98 - 111 mmol/L 106 101 105  CO2 22 - 32 mmol/L _0 Calcium 8.9 - 10.3 mg/dL 9.7 9.0 7.9(L)  Total Protein 6.5 - 8.1 g/dL 7.4 7.6 6.2(L)  Total Bilirubin 0.3 - 1.2 mg/dL 0.6 0.6 0.6  Alkaline Phos 38 - 126 U/L 87 103 71  AST 15 - 41 U/L _1 ALT 0 - 44 U/L _2 Lab Results  Component Value Date   WBC 3.2 (L) 10/22/2018   HGB 10.1 (L) 10/22/2018   HCT 31.6 (L) 10/22/2018   MCV 92.7 10/22/2018   PLT 163 10/22/2018   NEUTROABS 2.0 10/22/2018    ASSESSMENT & PLAN:  Malignant neoplasm of left female breast ([place]) 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, grade 3, ER 0%, PR 0%, Ki-67 30%, HER-2 +3+ by IHC; hypoechoic mass 2 o'clock position 1.2 x 1.3 x 1.7 cm biopsy IDC, LV I present, grade 2-3, ER 0%, PR 0%, Ki-67 40%, HER-2 +3+, left axillary lymph node positive (3 nodes noted by [place]) T3 N1 stage IIIA  Treatment plan 1.Neoadjuvant chemotherapy with [place] completed 3/26/2020followed by Herceptin Perjeta maintenance  2.lumpectomywith  axillary lymphnode dissection: 07/12/2018 3.Adjuvant radiation therapy --------------------------------------------------------------------------------------------------------------------------------------------------- 07/12/2018:Lumpectomy (Dr. Marlou Starks): complete treatment response, no residual carcinoma present in breast or 11/11 lymph nodes.  Treatment plan: 1.Adjuvant radiation therapy: Slightly delayed because of COVID-19.  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   Left shoulder pain: Getting steroid injection.  Lower extremity edema: On as needed Bumex, much improved I discontinued potassium replacement today. Patient will come back every 3 weeks for Herceptin Perjeta and every 6 weeks for follow-up with me.    No orders of the defined types were placed in this encounter.  The patient has a good understanding of the overall plan. she agrees with it. she will call with any problems that may develop before the next visit here.  Nicholas Lose, MD 11/12/2018  Julious Oka Dorshimer am acting as scribe for Dr. Nicholas Lose.  I  have reviewed the above documentation for accuracy and completeness, and I agree with the above.

## 2018-11-12 ENCOUNTER — Inpatient Hospital Stay (HOSPITAL_BASED_OUTPATIENT_CLINIC_OR_DEPARTMENT_OTHER): Payer: Managed Care, Other (non HMO) | Admitting: Hematology and Oncology

## 2018-11-12 ENCOUNTER — Inpatient Hospital Stay: Payer: Managed Care, Other (non HMO) | Attending: Hematology and Oncology

## 2018-11-12 ENCOUNTER — Other Ambulatory Visit: Payer: Self-pay

## 2018-11-12 ENCOUNTER — Inpatient Hospital Stay: Payer: Managed Care, Other (non HMO)

## 2018-11-12 DIAGNOSIS — C50412 Malignant neoplasm of upper-outer quadrant of left female breast: Secondary | ICD-10-CM

## 2018-11-12 DIAGNOSIS — R600 Localized edema: Secondary | ICD-10-CM | POA: Insufficient documentation

## 2018-11-12 DIAGNOSIS — G620 Drug-induced polyneuropathy: Secondary | ICD-10-CM | POA: Diagnosis not present

## 2018-11-12 DIAGNOSIS — Z171 Estrogen receptor negative status [ER-]: Secondary | ICD-10-CM

## 2018-11-12 DIAGNOSIS — Z79899 Other long term (current) drug therapy: Secondary | ICD-10-CM | POA: Diagnosis not present

## 2018-11-12 DIAGNOSIS — Z95828 Presence of other vascular implants and grafts: Secondary | ICD-10-CM

## 2018-11-12 DIAGNOSIS — T451X5A Adverse effect of antineoplastic and immunosuppressive drugs, initial encounter: Secondary | ICD-10-CM | POA: Diagnosis not present

## 2018-11-12 DIAGNOSIS — Z5112 Encounter for antineoplastic immunotherapy: Secondary | ICD-10-CM | POA: Diagnosis not present

## 2018-11-12 LAB — CBC WITH DIFFERENTIAL (CANCER CENTER ONLY)
Abs Immature Granulocytes: 0.01 10*3/uL (ref 0.00–0.07)
Basophils Absolute: 0 10*3/uL (ref 0.0–0.1)
Basophils Relative: 0 %
Eosinophils Absolute: 0.1 10*3/uL (ref 0.0–0.5)
Eosinophils Relative: 2 %
HCT: 32.3 % — ABNORMAL LOW (ref 36.0–46.0)
Hemoglobin: 10.5 g/dL — ABNORMAL LOW (ref 12.0–15.0)
Immature Granulocytes: 0 %
Lymphocytes Relative: 27 %
Lymphs Abs: 0.8 10*3/uL (ref 0.7–4.0)
MCH: 29.6 pg (ref 26.0–34.0)
MCHC: 32.5 g/dL (ref 30.0–36.0)
MCV: 91 fL (ref 80.0–100.0)
Monocytes Absolute: 0.3 10*3/uL (ref 0.1–1.0)
Monocytes Relative: 8 %
Neutro Abs: 1.9 10*3/uL (ref 1.7–7.7)
Neutrophils Relative %: 63 %
Platelet Count: 163 10*3/uL (ref 150–400)
RBC: 3.55 MIL/uL — ABNORMAL LOW (ref 3.87–5.11)
RDW: 15.4 % (ref 11.5–15.5)
WBC Count: 3.1 10*3/uL — ABNORMAL LOW (ref 4.0–10.5)
nRBC: 0 % (ref 0.0–0.2)

## 2018-11-12 LAB — CMP (CANCER CENTER ONLY)
ALT: 12 U/L (ref 0–44)
AST: 18 U/L (ref 15–41)
Albumin: 4 g/dL (ref 3.5–5.0)
Alkaline Phosphatase: 83 U/L (ref 38–126)
Anion gap: 10 (ref 5–15)
BUN: 18 mg/dL (ref 8–23)
CO2: 25 mmol/L (ref 22–32)
Calcium: 9.4 mg/dL (ref 8.9–10.3)
Chloride: 104 mmol/L (ref 98–111)
Creatinine: 0.99 mg/dL (ref 0.44–1.00)
GFR, Est AFR Am: >60 mL/min (ref >60)
GFR, Estimated: >60 mL/min (ref >60)
Glucose, Bld: 84 mg/dL (ref 70–99)
Potassium: 3.9 mmol/L (ref 3.5–5.1)
Sodium: 139 mmol/L (ref 135–145)
Total Bilirubin: 0.8 mg/dL (ref 0.3–1.2)
Total Protein: 7.5 g/dL (ref 6.5–8.1)

## 2018-11-12 MED ORDER — TRASTUZUMAB-DKST CHEMO 150 MG IV SOLR
450.0000 mg | Freq: Once | INTRAVENOUS | Status: AC
  Administered 2018-11-12: 10:00:00 450 mg via INTRAVENOUS
  Filled 2018-11-12: qty 21.43

## 2018-11-12 MED ORDER — SODIUM CHLORIDE 0.9% FLUSH
10.0000 mL | INTRAVENOUS | Status: DC | PRN
  Administered 2018-11-12: 10 mL
  Filled 2018-11-12: qty 10

## 2018-11-12 MED ORDER — ACETAMINOPHEN 325 MG PO TABS
650.0000 mg | ORAL_TABLET | Freq: Once | ORAL | Status: AC
  Administered 2018-11-12: 650 mg via ORAL

## 2018-11-12 MED ORDER — HEPARIN SOD (PORK) LOCK FLUSH 100 UNIT/ML IV SOLN
500.0000 [IU] | Freq: Once | INTRAVENOUS | Status: AC | PRN
  Administered 2018-11-12: 12:00:00 500 [IU]
  Filled 2018-11-12: qty 5

## 2018-11-12 MED ORDER — SODIUM CHLORIDE 0.9 % IV SOLN
Freq: Once | INTRAVENOUS | Status: AC
  Administered 2018-11-12: 10:00:00 via INTRAVENOUS
  Filled 2018-11-12: qty 250

## 2018-11-12 MED ORDER — ACETAMINOPHEN 325 MG PO TABS
ORAL_TABLET | ORAL | Status: AC
  Filled 2018-11-12: qty 2

## 2018-11-12 MED ORDER — SODIUM CHLORIDE 0.9 % IV SOLN
420.0000 mg | Freq: Once | INTRAVENOUS | Status: AC
  Administered 2018-11-12: 11:00:00 420 mg via INTRAVENOUS
  Filled 2018-11-12: qty 14

## 2018-11-12 MED ORDER — DIPHENHYDRAMINE HCL 25 MG PO CAPS
ORAL_CAPSULE | ORAL | Status: AC
  Filled 2018-11-12: qty 2

## 2018-11-12 MED ORDER — DIPHENHYDRAMINE HCL 25 MG PO CAPS
50.0000 mg | ORAL_CAPSULE | Freq: Once | ORAL | Status: AC
  Administered 2018-11-12: 10:00:00 50 mg via ORAL

## 2018-11-15 ENCOUNTER — Ambulatory Visit (INDEPENDENT_AMBULATORY_CARE_PROVIDER_SITE_OTHER): Payer: 59 | Admitting: Orthopedic Surgery

## 2018-11-15 DIAGNOSIS — M75112 Incomplete rotator cuff tear or rupture of left shoulder, not specified as traumatic: Secondary | ICD-10-CM

## 2018-11-16 ENCOUNTER — Encounter: Payer: Self-pay | Admitting: Radiation Oncology

## 2018-11-16 ENCOUNTER — Encounter: Payer: Self-pay | Admitting: Hematology and Oncology

## 2018-11-17 ENCOUNTER — Encounter: Payer: Self-pay | Admitting: Orthopedic Surgery

## 2018-11-17 ENCOUNTER — Telehealth: Payer: Self-pay

## 2018-11-17 ENCOUNTER — Ambulatory Visit
Admission: RE | Admit: 2018-11-17 | Discharge: 2018-11-17 | Disposition: A | Discharge status: Home / Self Care | Payer: 59 | Source: Ambulatory Visit | Attending: Radiation Oncology | Admitting: Radiation Oncology

## 2018-11-17 DIAGNOSIS — M75112 Incomplete rotator cuff tear or rupture of left shoulder, not specified as traumatic: Secondary | ICD-10-CM | POA: Diagnosis not present

## 2018-11-17 HISTORY — DX: Personal history of irradiation: Z92.3

## 2018-11-17 MED ORDER — BUPIVACAINE HCL 0.5 % IJ SOLN
9.0000 mL | INTRAMUSCULAR | Status: AC | PRN
  Administered 2018-11-17: 9 mL via INTRA_ARTICULAR

## 2018-11-17 MED ORDER — LIDOCAINE HCL 1 % IJ SOLN
5.0000 mL | INTRAMUSCULAR | Status: AC | PRN
  Administered 2018-11-17: 5 mL

## 2018-11-17 MED ORDER — METHYLPREDNISOLONE ACETATE 40 MG/ML IJ SUSP
40.0000 mg | INTRAMUSCULAR | Status: AC | PRN
  Administered 2018-11-17: 40 mg via INTRA_ARTICULAR

## 2018-11-17 NOTE — Progress Notes (Signed)
I called the patient today about her upcoming follow-up appointment in radiation oncology.   Given concerns about the COVID-19 pandemic, I offered a phone assessment with the patient to determine if coming to the clinic was necessary. She accepted.  I let the patient know that I had spoken with Dr. Isidore Moos, and she wanted them to know the importance of washing their hands for at least 20 seconds at a time, especially after going out in public, and before they eat.  Limit going out in public whenever possible. Do not touch your face, unless your hands are clean, such as when bathing. Get plenty of rest, eat well, and stay hydrated.   The patient denies any symptomatic concerns.  Specifically, they report good healing of their skin in the radiation fields.  Skin is intact.    I recommended that she continue skin care by applying oil or lotion with vitamin E to the skin in the radiation fields, BID, for 2 more months.  Continue follow-up with medical oncology - follow-up is scheduled on 12/03/18 for her next Herceptin infusion .  I explained that yearly mammograms are important for patients with intact breast tissue, and physical exams are important after mastectomy for patients that cannot undergo mammography.  I encouraged her to call if she had further questions or concerns about her healing. Otherwise, she will follow-up PRN in radiation oncology. Patient is pleased with this plan, and we will cancel her upcoming follow-up to reduce the risk of COVID-19 transmission.

## 2018-11-17 NOTE — Telephone Encounter (Signed)
Faxed OV note from 08/24 to [employer] attn: Appeals to [phone] per patients request. Have made patient aware. Mailed copy of this to patient as well per her request

## 2018-11-17 NOTE — Progress Notes (Addendum)
Office Visit Note   Patient: Christy Serrano           Date of Birth: Jun 09, 1955           MRN: [id number] Visit Date: 11/15/2018 Requested by: Velna Hatchet, MD [address] [place],  [place] [address] PCP: Velna Hatchet, MD  Subjective: Chief Complaint  Patient presents with  . Left Shoulder - Pain    HPI: Garnie is a patient with left arm and shoulder pain.  Since have seen her she is had an MRI scan which shows a high-grade bursal sided tear of the left supraspinatus with no full-thickness component.  She does report some clicking.              ROS: All systems reviewed are negative as they relate to the chief complaint within the history of present illness.  Patient denies  fevers or chills.   Assessment & Plan: Visit Diagnoses:  1. Nontraumatic incomplete tear of left rotator cuff     Plan: Impression is high-grade bursal sided tear of the left supraspinatus with palpable crepitus on exam.  I think she may be heading for surgery but at this point in time we will try 1 more injection and then see her back in 2 to 3 months if her symptoms progress.  She wants to try to avoid surgery if possible.  Follow-Up Instructions: Return if symptoms worsen or fail to improve.   Orders:  No orders of the defined types were placed in this encounter.  No orders of the defined types were placed in this encounter.     Procedures: Large Joint Inj: L subacromial bursa on 11/17/2018 10:25 AM Indications: diagnostic evaluation and pain Details: 18 G 1.5 in needle, posterior approach  Arthrogram: No  Medications: 9 mL bupivacaine 0.5 %; 40 mg methylPREDNISolone acetate 40 MG/ML; 5 mL lidocaine 1 % Outcome: tolerated well, no immediate complications Procedure, treatment alternatives, risks and benefits explained, specific risks discussed. Consent was given by the patient. Immediately prior to procedure a time out was called to verify the correct patient, procedure, equipment,  support staff and site/side marked as required. Patient was prepped and draped in the usual sterile fashion.       Clinical Data: No additional findings.  Objective: Vital Signs: There were no vitals taken for this visit.  Physical Exam:   Constitutional: Patient appears well-developed HEENT:  Head: Normocephalic Eyes:EOM are normal Neck: Normal range of motion Cardiovascular: Normal rate Pulmonary/chest: Effort normal Neurologic: Patient is alert Skin: Skin is warm Psychiatric: Patient has normal mood and affect    Ortho Exam: Ortho exam demonstrates full active and passive range of motion of the left shoulder.  Rotator cuff strength is actually pretty good today to infraspinatus supraspinatus and subscap muscle testing.  No masses lymphadenopathy or skin changes noted in that shoulder girdle region.  She does have a little bit of crepitus with internal/external rotation of the arm.  It should be noted that the primary reason for the MRI scan was to rule out any type of metastatic process in the arm and shoulder region.  She was having night pain and pain on a daily basis with recent diagnosis of breast cancer status post lumpectomy in April 2020.  I believe that it was a medical necessity to rule out malignancy in that shoulder girdle region based on her symptoms.  This is the reason she had an MRI scan without a full 6 weeks of physical therapy.  Specialty  Comments:  No specialty comments available.  Imaging: No results found.   PMFS History: Patient Active Problem List   Diagnosis Date Noted  . Carcinoma of upper-outer quadrant of left breast in female, estrogen receptor negative ([place]) 07/12/2018  . Seizure-like activity ([place])   . Hyponatremia   . Recurrent syncope 03/08/2018  . LFT elevation   . Seizure ([place])   . Thrombocytopenia ([place])   . Port-A-Cath in place 03/04/2018  . Genetic testing 02/23/2018  . Family history of breast cancer   . Family history of  prostate cancer   . Family history of ovarian cancer   . Malignant neoplasm of left female breast ([place]) 02/03/2018  . Prediabetes 01/06/2018  . Reactive airway disease without complication [id number]  . Class 1 obesity with serious comorbidity and body mass index (BMI) of 32.0 to 32.9 in adult 12/22/2017   Past Medical History:  Diagnosis Date  . Cancer ([place])    left breast  . Chronic bronchitis ([place])   . Family history of breast cancer   . Family history of ovarian cancer   . Family history of prostate cancer   . GERD (gastroesophageal reflux disease)    due to chemotherapy  . History of radiation therapy 09/02/18- 10/14/18   Left Breast 25 fx for a total of 50 Gy, Left SCV, PA 25 fractions for a total of 50 Gy, Left Breast boost 5 fractions, for a total of 10 Gy.   . Lactose intolerance   . PONV (postoperative nausea and vomiting)     Family History  Problem Relation Age of Onset  . High blood pressure Mother   . Heart attack Mother        CHF  . High blood pressure Father   . Prostate cancer Father        dx mid 61s  . Breast cancer Sister 34       negative Invitae 46 gene panel  . Breast cancer Paternal Aunt        dx mid 64s  . Cancer Paternal Uncle        jaw cancer d. 66s  . Ovarian cancer Maternal Grandmother        d. 23s  . Cancer Paternal Grandmother        either stomach or ovarian cancer  . Breast cancer Sister 26       "negative 21 gene panel"    Past Surgical History:  Procedure Laterality Date  . BREAST LUMPECTOMY WITH RADIOACTIVE SEED AND AXILLARY LYMPH NODE DISSECTION Left 07/12/2018   Procedure: LEFT BREAST RADIOACTIVE SEED X 3 LUMPECTOMY X 3  AND AXILLARY LYMPH NODE DISSECTION;  Surgeon: Jovita Kussmaul, MD;  Location: [facility];  Service: General;  Laterality: Left;  . PORTACATH PLACEMENT Right 03/03/2018   Procedure: INSERTION PORT-A-CATH;  Surgeon: Jovita Kussmaul, MD;  Location: [facility];  Service: General;   Laterality: Right;  . ROTATOR CUFF REPAIR Right 2006  . TOTAL ABDOMINAL HYSTERECTOMY  2010   Social History   Occupational History  . Occupation: Quarry manager    Comment: [employer]  Tobacco Use  . Smoking status: Never Smoker  . Smokeless tobacco: Never Used  Substance and Sexual Activity  . Alcohol use: No    Comment: quit long ago  . Drug use: No  . Sexual activity: Not on file

## 2018-11-18 ENCOUNTER — Encounter (INDEPENDENT_AMBULATORY_CARE_PROVIDER_SITE_OTHER): Payer: Self-pay | Admitting: Bariatrics

## 2018-11-18 ENCOUNTER — Ambulatory Visit (INDEPENDENT_AMBULATORY_CARE_PROVIDER_SITE_OTHER): Payer: 59 | Admitting: Bariatrics

## 2018-11-18 ENCOUNTER — Other Ambulatory Visit: Payer: Self-pay

## 2018-11-18 VITALS — BP 133/62 | HR 61 | Temp 98.4°F | Ht 65.0 in | Wt 167.0 lb

## 2018-11-18 DIAGNOSIS — E669 Obesity, unspecified: Secondary | ICD-10-CM | POA: Diagnosis not present

## 2018-11-18 DIAGNOSIS — R7303 Prediabetes: Secondary | ICD-10-CM

## 2018-11-18 DIAGNOSIS — E559 Vitamin D deficiency, unspecified: Secondary | ICD-10-CM

## 2018-11-18 DIAGNOSIS — Z6830 Body mass index (BMI) 30.0-30.9, adult: Secondary | ICD-10-CM

## 2018-11-22 ENCOUNTER — Encounter (INDEPENDENT_AMBULATORY_CARE_PROVIDER_SITE_OTHER): Payer: Self-pay | Admitting: Bariatrics

## 2018-11-22 ENCOUNTER — Telehealth: Payer: Self-pay | Admitting: Physical Therapy

## 2018-11-22 NOTE — Progress Notes (Signed)
Office: [phone]  /  Fax: [phone]   HPI:   Chief Complaint: OBESITY Christy Serrano is here to discuss her progress with her obesity treatment plan. She is on the keep a food journal with 1500 calories and 90 grams of protein daily plan and is following her eating plan approximately 100 % of the time. She states she is walking 30 minutes 2 times per week. Christy Serrano is down 1 pound. She denies struggling with the plan. She is staying with 300 calorie snacks. Christy Serrano is getting adequate water, and she is doing well with her protein. Her weight is 167 lb (75.8 kg) today and has had a weight loss of 1 pound over a period of 4 weeks since her last visit. She has lost 18 lbs since starting treatment with [place].  Pre-Diabetes Christy Serrano has a diagnosis of prediabetes based on her elevated Hgb A1c and was informed this puts her at greater risk of developing diabetes. Her last A1c was at 5.7 She is not taking metformin currently and continues to work on diet and exercise to decrease risk of diabetes. She denies nausea or hypoglycemia.  Vitamin D deficiency Christy Serrano has a diagnosis of vitamin D deficiency. She is currently taking OTC vit D and her last vitamin D level was at 46.6 She denies nausea, vomiting or muscle weakness.  ASSESSMENT AND PLAN:  Prediabetes  Vitamin D deficiency  Class 1 obesity with serious comorbidity and body mass index (BMI) of 30.0 to 30.9 in adult, unspecified obesity type - BMI was greater than 30 at start of program   PLAN:  Pre-Diabetes Christy Serrano will continue to work on weight loss, continuing activities, increasing lean protein and decreasing simple carbohydrates in her diet to help decrease the risk of diabetes. She was informed that eating too many simple carbohydrates or too many calories at one sitting increases the likelihood of GI side effects. Righteous agreed to follow up with [place] as directed to monitor her progress.  Vitamin D Deficiency Christy Serrano was  informed that low vitamin D levels contributes to fatigue and are associated with obesity, breast, and colon cancer. She will continue to take OTC vitamin D and will follow up for routine testing of vitamin D, at least 2-3 times per year. She was informed of the risk of over-replacement of vitamin D and agrees to not increase her dose unless she discusses this with [place] first.  Obesity Christy Serrano is currently in the action stage of change. As such, her goal is to continue with weight loss efforts She has agreed to keep a food journal with 1500 calories and 90+ grams of protein  and follow the Category 3 plan Christy Serrano will continue to walk for weight loss and overall health benefits. We discussed the following Behavioral Modification Strategies today: planning for success, increase H2O intake, no skipping meals, keeping healthy foods in the home, increasing lean protein intake, decreasing simple carbohydrates, increasing vegetables, decrease eating out and work on meal planning and intentional eating  Christy Serrano has agreed to follow up with our clinic in 3 weeks. She was informed of the importance of frequent follow up visits to maximize her success with intensive lifestyle modifications for her multiple health conditions.  ALLERGIES: Allergies  Allergen Reactions   Oxycodone-Acetaminophen Other (See Comments) and Palpitations    tachycardia    Gabapentin Swelling    Lower extremity edema   Amoxicillin Rash    DID THE REACTION INVOLVE: Swelling of the face/tongue/throat, SOB, or low BP? No Sudden or severe  rash/hives, skin peeling, or the inside of the mouth or nose? No Did it require medical treatment? No When did it last happen?in her 3's If all above answers are NO, may proceed with cephalosporin use.     MEDICATIONS: Current Outpatient Medications on File Prior to Visit  Medication Sig Dispense Refill   acetaminophen (TYLENOL) 500 MG tablet Take 1,000 mg by mouth every 6  (six) hours as needed for mild pain.     albuterol (PROVENTIL HFA;VENTOLIN HFA) 108 (90 Base) MCG/ACT inhaler Inhale into the lungs every 6 (six) hours as needed for wheezing or shortness of breath.     budesonide-formoterol (SYMBICORT) 160-4.5 MCG/ACT inhaler Inhale 2 puffs into the lungs 2 (two) times daily.     bumetanide (BUMEX) 1 MG tablet Take 1 tablet (1 mg total) by mouth 2 (two) times daily. 60 tablet 0   Calcium-Phosphorus-Vitamin D (CITRACAL +D3 PO) Take 1,200 mcg by mouth.     cholecalciferol (VITAMIN D) 1000 units tablet Take 1,000 Units by mouth daily.     estradiol (VIVELLE-DOT) 0.05 MG/24HR patch      gabapentin (NEURONTIN) 100 MG capsule Take 1 capsule (100 mg total) by mouth daily. 90 capsule 3   HYDROcodone-acetaminophen (NORCO/VICODIN) 5-325 MG tablet Take 1 tablet by mouth every 6 (six) hours as needed for moderate pain or severe pain. 30 tablet 0   Multiple Vitamin (MULTIVITAMIN) capsule Take 1 capsule by mouth daily.     spironolactone (ALDACTONE) 25 MG tablet Take 0.5 tablets (12.5 mg total) by mouth daily. 45 tablet 3   No current facility-administered medications on file prior to visit.     PAST MEDICAL HISTORY: Past Medical History:  Diagnosis Date   Cancer [facility])    left breast   Chronic bronchitis ([place])    Family history of breast cancer    Family history of ovarian cancer    Family history of prostate cancer    GERD (gastroesophageal reflux disease)    due to chemotherapy   History of radiation therapy 09/02/18- 10/14/18   Left Breast 25 fx for a total of 50 Gy, Left SCV, PA 25 fractions for a total of 50 Gy, Left Breast boost 5 fractions, for a total of 10 Gy.    Lactose intolerance    PONV (postoperative nausea and vomiting)     PAST SURGICAL HISTORY: Past Surgical History:  Procedure Laterality Date   BREAST LUMPECTOMY WITH RADIOACTIVE SEED AND AXILLARY LYMPH NODE DISSECTION Left 07/12/2018   Procedure: LEFT BREAST RADIOACTIVE SEED  X 3 LUMPECTOMY X 3  AND AXILLARY LYMPH NODE DISSECTION;  Surgeon: Jovita Kussmaul, MD;  Location: [facility];  Service: General;  Laterality: Left;   PORTACATH PLACEMENT Right 03/03/2018   Procedure: INSERTION PORT-A-CATH;  Surgeon: Jovita Kussmaul, MD;  Location: [facility];  Service: General;  Laterality: Right;   ROTATOR CUFF REPAIR Right 2006   TOTAL ABDOMINAL HYSTERECTOMY  2010    SOCIAL HISTORY: Social History   Tobacco Use   Smoking status: Never Smoker   Smokeless tobacco: Never Used  Substance Use Topics   Alcohol use: No    Comment: quit long ago   Drug use: No    FAMILY HISTORY: Family History  Problem Relation Age of Onset   High blood pressure Mother    Heart attack Mother        CHF   High blood pressure Father    Prostate cancer Father  dx mid 38s   Breast cancer Sister 16       negative Invitae 40 gene panel   Breast cancer Paternal Aunt        dx mid 30s   Cancer Paternal Uncle        jaw cancer d. 83s   Ovarian cancer Maternal Grandmother        d. 55s   Cancer Paternal Grandmother        either stomach or ovarian cancer   Breast cancer Sister 61       "negative 21 gene panel"    ROS: Review of Systems  Constitutional: Positive for weight loss.  Gastrointestinal: Negative for nausea and vomiting.  Musculoskeletal:       Negative for muscle weakness  Endo/Heme/Allergies:       Negative for hypoglycemia    PHYSICAL EXAM: Blood pressure 133/62, pulse 61, temperature 98.4 F (36.9 C), temperature source Oral, height 5\' 5"  (1.651 m), weight 167 lb (75.8 kg), SpO2 98 %. Body mass index is 27.79 kg/m. Physical Exam Vitals signs reviewed.  Constitutional:      Appearance: Normal appearance. She is well-developed. She is obese.  Cardiovascular:     Rate and Rhythm: Normal rate.  Pulmonary:     Effort: Pulmonary effort is normal.  Musculoskeletal: Normal range of motion.  Skin:    General:  Skin is warm and dry.  Neurological:     Mental Status: She is alert and oriented to person, place, and time.  Psychiatric:        Mood and Affect: Mood normal.        Behavior: Behavior normal.     RECENT LABS AND TESTS: BMET    Component Value Date/Time   NA 139 11/12/2018 0815   NA 138 12/22/2017 1235   K 3.9 11/12/2018 0815   CL 104 11/12/2018 0815   CO2 25 11/12/2018 0815   GLUCOSE 84 11/12/2018 0815   BUN 18 11/12/2018 0815   BUN 12 12/22/2017 1235   CREATININE 0.99 11/12/2018 0815   CALCIUM 9.4 11/12/2018 0815   GFRNONAA >60 11/12/2018 0815   GFRAA >60 11/12/2018 0815   Lab Results  Component Value Date   HGBA1C 5.7 (H) 10/20/2018   HGBA1C 5.6 05/31/2018   HGBA1C 6.1 (H) 12/22/2017   Lab Results  Component Value Date   INSULIN 4.8 05/31/2018   INSULIN 13.7 12/22/2017   CBC    Component Value Date/Time   WBC 3.1 (L) 11/12/2018 0815   WBC 3.9 (L) 08/20/2018 0845   RBC 3.55 (L) 11/12/2018 0815   HGB 10.5 (L) 11/12/2018 0815   HGB 12.3 12/22/2017 1235   HCT 32.3 (L) 11/12/2018 0815   HCT 38.2 12/22/2017 1235   PLT 163 11/12/2018 0815   MCV 91.0 11/12/2018 0815   MCV 92 12/22/2017 1235   MCH 29.6 11/12/2018 0815   MCHC 32.5 11/12/2018 0815   RDW 15.4 11/12/2018 0815   RDW 14.8 12/22/2017 1235   LYMPHSABS 0.8 11/12/2018 0815   LYMPHSABS 2.0 12/22/2017 1235   MONOABS 0.3 11/12/2018 0815   EOSABS 0.1 11/12/2018 0815   EOSABS 0.0 12/22/2017 1235   BASOSABS 0.0 11/12/2018 0815   BASOSABS 0.0 12/22/2017 1235   Iron/TIBC/Ferritin/ %Sat No results found for: IRON, TIBC, FERRITIN, IRONPCTSAT Lipid Panel     Component Value Date/Time   CHOL 176 12/22/2017 1235   TRIG 111 12/22/2017 1235   HDL 61 12/22/2017 1235   LDLCALC 93 12/22/2017 1235  Hepatic Function Panel     Component Value Date/Time   PROT 7.5 11/12/2018 0815   PROT 7.6 12/22/2017 1235   ALBUMIN 4.0 11/12/2018 0815   ALBUMIN 4.8 12/22/2017 1235   AST 18 11/12/2018 0815   ALT 12  11/12/2018 0815   ALKPHOS 83 11/12/2018 0815   BILITOT 0.8 11/12/2018 0815      Component Value Date/Time   TSH 3.644 03/10/2018 0538   TSH 1.930 12/22/2017 1235     Ref. Range 10/20/2018 15:50  Vitamin D, 25-Hydroxy Latest Ref Range: 30.0 - 100.0 ng/mL 46.6    OBESITY BEHAVIORAL INTERVENTION VISIT  Today's visit was # 13   Starting weight: 195 lbs Starting date: 12/22/2017 Today's weight : 167 lbs  Today's date: 11/18/2018 Total lbs lost to date: 18    11/18/2018  Height 5\' 5"  (1.651 m)  Weight 167 lb (75.8 kg)  BMI (Calculated) 27.79  BLOOD PRESSURE - SYSTOLIC [id number]  BLOOD PRESSURE - DIASTOLIC 62   Body Fat % 0000000 %  Total Body Water (lbs) 76 lbs    ASK: We discussed the diagnosis of obesity with Mackie Pai today and Izariah agreed to give [place] permission to discuss obesity behavioral modification therapy today.  ASSESS: Calei has the diagnosis of obesity and her BMI today is 27.79 Lamae is in the action stage of change   ADVISE: Kalia was educated on the multiple health risks of obesity as well as the benefit of weight loss to improve her health. She was advised of the need for long term treatment and the importance of lifestyle modifications to improve her current health and to decrease her risk of future health problems.  AGREE: Multiple dietary modification options and treatment options were discussed and  Sybilla agreed to follow the recommendations documented in the above note.  ARRANGE: Tyionna was educated on the importance of frequent visits to treat obesity as outlined per CMS and USPSTF guidelines and agreed to schedule her next follow up appointment today.  Corey Skains, am acting as Location manager for [employer]. Owens Shark, DO  I have reviewed the above documentation for accuracy and completeness, and I agree with the above. -Jearld Lesch, DO

## 2018-11-22 NOTE — Telephone Encounter (Signed)
Spoke with patient when she called the clinic. She reported we had put her on hold until she finished XRT. She finished XRT and saw Dr. Marlou Sa for her shoulder problem. She said he gave her a cortisone injection on 11/15/2018 and she was doing much better but had still had some popping in her shoulder. She said she does not feel limited with her ROM and function. We decided to D/C her fr now and she knows to call us if she feels she needs to return to PT. Christy Serrano, [place] 11/22/18 1:11 PM

## 2018-11-23 ENCOUNTER — Encounter: Payer: Self-pay | Admitting: Adult Health

## 2018-12-01 ENCOUNTER — Encounter: Payer: Self-pay | Admitting: Pharmacy Technician

## 2018-12-03 ENCOUNTER — Inpatient Hospital Stay: Payer: Managed Care, Other (non HMO) | Attending: Hematology and Oncology

## 2018-12-03 ENCOUNTER — Inpatient Hospital Stay: Payer: Managed Care, Other (non HMO)

## 2018-12-03 ENCOUNTER — Encounter: Payer: Self-pay | Admitting: *Deleted

## 2018-12-03 ENCOUNTER — Other Ambulatory Visit: Payer: Self-pay

## 2018-12-03 VITALS — BP 122/61 | HR 59 | Temp 98.1°F | Resp 18 | Ht 65.0 in | Wt 171.0 lb

## 2018-12-03 DIAGNOSIS — Z5112 Encounter for antineoplastic immunotherapy: Secondary | ICD-10-CM | POA: Diagnosis present

## 2018-12-03 DIAGNOSIS — Z171 Estrogen receptor negative status [ER-]: Secondary | ICD-10-CM | POA: Diagnosis not present

## 2018-12-03 DIAGNOSIS — C50412 Malignant neoplasm of upper-outer quadrant of left female breast: Secondary | ICD-10-CM | POA: Diagnosis present

## 2018-12-03 DIAGNOSIS — Z95828 Presence of other vascular implants and grafts: Secondary | ICD-10-CM

## 2018-12-03 LAB — CBC WITH DIFFERENTIAL (CANCER CENTER ONLY)
Abs Immature Granulocytes: 0.01 10*3/uL (ref 0.00–0.07)
Basophils Absolute: 0 10*3/uL (ref 0.0–0.1)
Basophils Relative: 1 %
Eosinophils Absolute: 0.1 10*3/uL (ref 0.0–0.5)
Eosinophils Relative: 2 %
HCT: 32.5 % — ABNORMAL LOW (ref 36.0–46.0)
Hemoglobin: 10.5 g/dL — ABNORMAL LOW (ref 12.0–15.0)
Immature Granulocytes: 0 %
Lymphocytes Relative: 31 %
Lymphs Abs: 0.9 10*3/uL (ref 0.7–4.0)
MCH: 30.1 pg (ref 26.0–34.0)
MCHC: 32.3 g/dL (ref 30.0–36.0)
MCV: 93.1 fL (ref 80.0–100.0)
Monocytes Absolute: 0.3 10*3/uL (ref 0.1–1.0)
Monocytes Relative: 9 %
Neutro Abs: 1.7 10*3/uL (ref 1.7–7.7)
Neutrophils Relative %: 57 %
Platelet Count: 164 10*3/uL (ref 150–400)
RBC: 3.49 MIL/uL — ABNORMAL LOW (ref 3.87–5.11)
RDW: 15.5 % (ref 11.5–15.5)
WBC Count: 3 10*3/uL — ABNORMAL LOW (ref 4.0–10.5)
nRBC: 0 % (ref 0.0–0.2)

## 2018-12-03 LAB — CMP (CANCER CENTER ONLY)
ALT: 15 U/L (ref 0–44)
AST: 18 U/L (ref 15–41)
Albumin: 4.1 g/dL (ref 3.5–5.0)
Alkaline Phosphatase: 83 U/L (ref 38–126)
Anion gap: 7 (ref 5–15)
BUN: 15 mg/dL (ref 8–23)
CO2: 27 mmol/L (ref 22–32)
Calcium: 9 mg/dL (ref 8.9–10.3)
Chloride: 105 mmol/L (ref 98–111)
Creatinine: 1.07 mg/dL — ABNORMAL HIGH (ref 0.44–1.00)
GFR, Est AFR Am: >60 mL/min (ref >60)
GFR, Estimated: 56 mL/min — ABNORMAL LOW (ref >60)
Glucose, Bld: 83 mg/dL (ref 70–99)
Potassium: 3.8 mmol/L (ref 3.5–5.1)
Sodium: 139 mmol/L (ref 135–145)
Total Bilirubin: 0.6 mg/dL (ref 0.3–1.2)
Total Protein: 7.5 g/dL (ref 6.5–8.1)

## 2018-12-03 MED ORDER — DIPHENHYDRAMINE HCL 25 MG PO CAPS
50.0000 mg | ORAL_CAPSULE | Freq: Once | ORAL | Status: AC
  Administered 2018-12-03: 50 mg via ORAL

## 2018-12-03 MED ORDER — DIPHENHYDRAMINE HCL 25 MG PO CAPS
ORAL_CAPSULE | ORAL | Status: AC
  Filled 2018-12-03: qty 2

## 2018-12-03 MED ORDER — ACETAMINOPHEN 325 MG PO TABS
650.0000 mg | ORAL_TABLET | Freq: Once | ORAL | Status: AC
  Administered 2018-12-03: 650 mg via ORAL

## 2018-12-03 MED ORDER — HEPARIN SOD (PORK) LOCK FLUSH 100 UNIT/ML IV SOLN
500.0000 [IU] | Freq: Once | INTRAVENOUS | Status: AC | PRN
  Administered 2018-12-03: 500 [IU]
  Filled 2018-12-03: qty 5

## 2018-12-03 MED ORDER — SODIUM CHLORIDE 0.9 % IV SOLN
Freq: Once | INTRAVENOUS | Status: AC
  Administered 2018-12-03: 09:00:00 via INTRAVENOUS
  Filled 2018-12-03: qty 250

## 2018-12-03 MED ORDER — SODIUM CHLORIDE 0.9% FLUSH
10.0000 mL | INTRAVENOUS | Status: DC | PRN
  Administered 2018-12-03: 10 mL
  Filled 2018-12-03: qty 10

## 2018-12-03 MED ORDER — SODIUM CHLORIDE 0.9 % IV SOLN
420.0000 mg | Freq: Once | INTRAVENOUS | Status: AC
  Administered 2018-12-03: 420 mg via INTRAVENOUS
  Filled 2018-12-03: qty 14

## 2018-12-03 MED ORDER — ACETAMINOPHEN 325 MG PO TABS
ORAL_TABLET | ORAL | Status: AC
  Filled 2018-12-03: qty 2

## 2018-12-03 MED ORDER — TRASTUZUMAB CHEMO 150 MG IV SOLR
450.0000 mg | Freq: Once | INTRAVENOUS | Status: AC
  Administered 2018-12-03: 450 mg via INTRAVENOUS
  Filled 2018-12-03: qty 21.43

## 2018-12-03 NOTE — Patient Instructions (Signed)
[facility] Discharge Instructions for Patients Receiving Chemotherapy  Today you received the following chemotherapy agents: Trastuzumab, Perjeta  To help prevent nausea and vomiting after your treatment, we encourage you to take your nausea medication as directed.    If you develop nausea and vomiting that is not controlled by your nausea medication, call the clinic.   BELOW ARE SYMPTOMS THAT SHOULD BE REPORTED IMMEDIATELY:  *FEVER GREATER THAN 100.5 F  *CHILLS WITH OR WITHOUT FEVER  NAUSEA AND VOMITING THAT IS NOT CONTROLLED WITH YOUR NAUSEA MEDICATION  *UNUSUAL SHORTNESS OF BREATH  *UNUSUAL BRUISING OR BLEEDING  TENDERNESS IN MOUTH AND THROAT WITH OR WITHOUT PRESENCE OF ULCERS  *URINARY PROBLEMS  *BOWEL PROBLEMS  UNUSUAL RASH Items with * indicate a potential emergency and should be followed up as soon as possible.  Feel free to call the clinic should you have any questions or concerns. The clinic phone number is [phone].  Please show the [facility] at check-in to the Emergency Department and triage nurse.

## 2018-12-06 ENCOUNTER — Ambulatory Visit (HOSPITAL_BASED_OUTPATIENT_CLINIC_OR_DEPARTMENT_OTHER)
Admission: RE | Admit: 2018-12-06 | Discharge: 2018-12-06 | Disposition: A | Discharge status: Home / Self Care | Payer: Managed Care, Other (non HMO) | Source: Ambulatory Visit | Attending: Internal Medicine | Admitting: Internal Medicine

## 2018-12-06 ENCOUNTER — Ambulatory Visit (HOSPITAL_COMMUNITY)
Admission: RE | Admit: 2018-12-06 | Discharge: 2018-12-06 | Disposition: A | Discharge status: Home / Self Care | Payer: Managed Care, Other (non HMO) | Source: Ambulatory Visit | Attending: Internal Medicine | Admitting: Internal Medicine

## 2018-12-06 ENCOUNTER — Other Ambulatory Visit: Payer: Self-pay

## 2018-12-06 VITALS — BP 152/78 | HR 62 | Wt 172.4 lb

## 2018-12-06 DIAGNOSIS — Z8249 Family history of ischemic heart disease and other diseases of the circulatory system: Secondary | ICD-10-CM | POA: Insufficient documentation

## 2018-12-06 DIAGNOSIS — Z880 Allergy status to penicillin: Secondary | ICD-10-CM | POA: Insufficient documentation

## 2018-12-06 DIAGNOSIS — C50412 Malignant neoplasm of upper-outer quadrant of left female breast: Secondary | ICD-10-CM

## 2018-12-06 DIAGNOSIS — Z79899 Other long term (current) drug therapy: Secondary | ICD-10-CM | POA: Insufficient documentation

## 2018-12-06 DIAGNOSIS — Z7951 Long term (current) use of inhaled steroids: Secondary | ICD-10-CM | POA: Insufficient documentation

## 2018-12-06 DIAGNOSIS — Z803 Family history of malignant neoplasm of breast: Secondary | ICD-10-CM | POA: Diagnosis not present

## 2018-12-06 DIAGNOSIS — R600 Localized edema: Secondary | ICD-10-CM | POA: Insufficient documentation

## 2018-12-06 DIAGNOSIS — I10 Essential (primary) hypertension: Secondary | ICD-10-CM

## 2018-12-06 DIAGNOSIS — Z171 Estrogen receptor negative status [ER-]: Secondary | ICD-10-CM | POA: Diagnosis not present

## 2018-12-06 DIAGNOSIS — E876 Hypokalemia: Secondary | ICD-10-CM | POA: Insufficient documentation

## 2018-12-06 DIAGNOSIS — Z888 Allergy status to other drugs, medicaments and biological substances status: Secondary | ICD-10-CM | POA: Insufficient documentation

## 2018-12-06 MED ORDER — SPIRONOLACTONE 25 MG PO TABS: 12.5000 mg | ORAL_TABLET | Freq: Every day | ORAL | 3 refills | Status: DC

## 2018-12-06 MED FILL — SPIRONOLACTONE 25 MG TABS: 25 | 30 days supply | Qty: 15 | Fill #0

## 2018-12-06 NOTE — Progress Notes (Signed)
  Echocardiogram 2D Echocardiogram has been performed.  Christy Serrano 12/06/2018, 1:53 PM

## 2018-12-06 NOTE — Patient Instructions (Signed)
RESTART Spironolactone 12.5mg  daily.  Your physician has requested that you regularly monitor and record your blood pressure readings at home. Please use the same machine at the same time of day to check your readings and record them to bring to your follow-up visit.  Your physician has requested that you have an echocardiogram. Echocardiography is a painless test that uses sound waves to create images of your heart. It provides your doctor with information about the size and shape of your heart and how well your heart's chambers and valves are working. This procedure takes approximately one hour. There are no restrictions for this procedure. This will be done at your follow up appointment.  Please follow up with the [facility] in 3 months with an echocardiogram.  At the [facility], you and your health needs are our priority. As part of our continuing mission to provide you with exceptional heart care, we have created designated Provider Care Teams. These Care Teams include your primary Cardiologist (physician) and Advanced Practice Providers (APPs- Physician Assistants and Nurse Practitioners) who all work together to provide you with the care you need, when you need it.   You may see any of the following providers on your designated Care Team at your next follow up: Marland Kitchen Dr Glori Bickers . Dr Loralie Champagne . Darrick Grinder, NP   Please be sure to bring in all your medications bottles to every appointment.

## 2018-12-06 NOTE — Progress Notes (Addendum)
CARDIO-ONCOLOGY CLINIC CONSULT NOTE  Referring Physician: Lindi Adie  Primary Cardiologist: None   HPI:  Christy Serrano is 63 y.o. female with left breast cancer referred by Dr. Lindi Adie for enrollment into the Cardio-Oncology program.   SUMMARY OF ONCOLOGIC HISTORY:       Malignant neoplasm of left female breast ([place])   01/18/2018 Initial Diagnosis    Screening mammogram detected microcalcifications and asymmetry, left breast segmental microcalcifications UOQ 1.5 x 4.3 x 5.4 cm biopsy-proven IDC with DCIS with LV I, grade 3, ER 0%, PR 0%, Ki-67 30%, HER-2 +3+ by IHC; hypoechoic mass 2 o'clock position 1.2 x 1.3 x 1.7 cm biopsy IDC, LV I present, grade 2-3, ER 0%, PR 0%, Ki-67 40%, HER-2 +3+, left axillary lymph node positive (3 nodes noted by [place]) T3 N1 stage IIIA    02/03/2018 Cancer Staging    Staging form: Breast, AJCC 8th Edition - Clinical: Stage IIIA (cT3, cN1, cM0, G3, ER-, PR-, HER2+) - Signed by Nicholas Lose, MD on 02/03/2018    02/20/2018 Genetic Testing    No pathogenic variants identified on the [facility] [facility] Expanded + RNA insight panel. Genes Analyzed (67 total): AIP, ALK, APC*, ATM*, BAP1, BARD1, BLM, BMPR1A, BRCA1*, BRCA2*, BRIP1*, CDH1*, CDK4, CDKN1B, CDKN2A, CHEK2*, DICER1, FANCC, FH, FLCN, GALNT12, HOXB13, MAX, MEN1, MET, MLH1*, MRE11A, MSH2*, MSH6*, MUTYH*, NBN, NF1*, NF2, PALB2*, PHOX2B, PMS2*, POLD1, POLE, POT1, PRKAR1A, PTCH1, PTEN*, RAD50, RAD51C*, RAD51D*, RB1, RET, SDHA, SDHAF2, SDHB, SDHC, SDHD, SMAD4, SMARCA4, SMARCB1, SMARCE1, STK11, SUFU, TMEM127, TP53*, TSC1, TSC2, VHL and XRCC2 (sequencing and deletion/duplication); MITF (sequencing only); EPCAM and GREM1 (deletion/duplication only). DNA and RNA analyses performed for * genes. The report date is 02/20/2018.    03/04/2018 -  Neo-Adjuvant Chemotherapy    Neoadjuvant chemotherapy with [facility] Perjeta    03/08/2018 - 03/10/2018 Hospital Admission    Admitted for recurrent syncope with loss of  consciousness for up to 5 minutes with loss of bladder, no abnormalities identified.  MRI brain, carotid ultrasound, EEG studies were negative.     Finished chemo 06/17/18. Now receiving Herceptin/Perjeta and XRT. Feeling better. No SOB, orthopnea or PND. Developed LE edema. Resolved with bumex and spiro. Now off those meds and only taking prn. Edema completely resolved.    Echo today 12/06/18 EF 55-60% GLS -17.7% (likely underestimated) Grade I DD Personally reviewed  Echo 6/20 EF 60-65% GLS -25.9% Personally reviewed   Echo 3/20  EF 60-65% Grade I DD GLS -23.1% Personally reviewed Echo 03/02/18  LVEF of 60-65%    Past Medical History:  Diagnosis Date  . Cancer ([place])    left breast  . Chronic bronchitis ([place])   . Family history of breast cancer   . Family history of ovarian cancer   . Family history of prostate cancer   . GERD (gastroesophageal reflux disease)    due to chemotherapy  . History of radiation therapy 09/02/18- 10/14/18   Left Breast 25 fx for a total of 50 Gy, Left SCV, PA 25 fractions for a total of 50 Gy, Left Breast boost 5 fractions, for a total of 10 Gy.   . Lactose intolerance   . PONV (postoperative nausea and vomiting)     Current Outpatient Medications  Medication Sig Dispense Refill  . acetaminophen (TYLENOL) 500 MG tablet Take 1,000 mg by mouth every 6 (six) hours as needed for mild pain.    Marland Kitchen albuterol (PROVENTIL HFA;VENTOLIN HFA) 108 (90 Base) MCG/ACT inhaler Inhale into the lungs every 6 (six) hours as needed  for wheezing or shortness of breath.    . budesonide-formoterol (SYMBICORT) 160-4.5 MCG/ACT inhaler Inhale 2 puffs into the lungs 2 (two) times daily.    . bumetanide (BUMEX) 1 MG tablet Take 1 mg by mouth 2 (two) times daily as needed.    . Calcium-Phosphorus-Vitamin D (CITRACAL +D3 PO) Take 1,200 mcg by mouth.    . cholecalciferol (VITAMIN D) 1000 units tablet Take 1,000 Units by mouth daily.    Marland Kitchen estradiol (VIVELLE-DOT) 0.05 MG/24HR patch      . gabapentin (NEURONTIN) 100 MG capsule Take 1 capsule (100 mg total) by mouth daily. 90 capsule 3  . HYDROcodone-acetaminophen (NORCO/VICODIN) 5-325 MG tablet Take 1 tablet by mouth every 6 (six) hours as needed for moderate pain or severe pain. 30 tablet 0  . Multiple Vitamin (MULTIVITAMIN) capsule Take 1 capsule by mouth daily.    Marland Kitchen spironolactone (ALDACTONE) 25 MG tablet Take 25 mg by mouth daily as needed.     No current facility-administered medications for this encounter.     Allergies  Allergen Reactions  . Oxycodone-Acetaminophen Other (See Comments) and Palpitations    tachycardia   . Gabapentin Swelling    Lower extremity edema  . Amoxicillin Rash    DID THE REACTION INVOLVE: Swelling of the face/tongue/throat, SOB, or low BP? No Sudden or severe rash/hives, skin peeling, or the inside of the mouth or nose? No Did it require medical treatment? No When did it last happen?in her 68's If all above answers are "NO", may proceed with cephalosporin use.       Social History   Socioeconomic History  . Marital status: Single    Spouse name: Not on file  . Number of children: 0  . Years of education: 17  . Highest education level: Not on file  Occupational History  . Occupation: Quarry manager    Comment: [employer]  . Financial resource strain: Not on file  . Food insecurity    Worry: Not on file    Inability: Not on file  . Transportation needs    Medical: No    Non-medical: No  Tobacco Use  . Smoking status: Never Smoker  . Smokeless tobacco: Never Used  Substance and Sexual Activity  . Alcohol use: No    Comment: quit long ago  . Drug use: No  . Sexual activity: Not on file  Lifestyle  . Physical activity    Days per week: Not on file    Minutes per session: Not on file  . Stress: Not on file  Relationships  . Social Herbalist on phone: Not on file    Gets together: Not on file    Attends religious service: Not on file     Active member of club or organization: Not on file    Attends meetings of clubs or organizations: Not on file    Relationship status: Not on file  . Intimate partner violence    Fear of current or ex partner: No    Emotionally abused: No    Physically abused: No    Forced sexual activity: No  Other Topics Concern  . Not on file  Social History Narrative   Lives with sister   Caffeine- rarely      Family History  Problem Relation Age of Onset  . High blood pressure Mother   . Heart attack Mother        CHF  . High blood pressure Father   .  Prostate cancer Father        dx mid 41s  . Breast cancer Sister 9       negative Invitae 46 gene panel  . Breast cancer Paternal Aunt        dx mid 16s  . Cancer Paternal Uncle        jaw cancer d. 25s  . Ovarian cancer Maternal Grandmother        d. 4s  . Cancer Paternal Grandmother        either stomach or ovarian cancer  . Breast cancer Sister 62       "negative 21 gene panel"    Vitals:   12/06/18 1457  BP: (!) 152/78  Pulse: 62  SpO2: 97%  Weight: 78.2 kg (172 lb 6 oz)    PHYSICAL EXAM: General:  Well appearing. No resp difficulty HEENT: normal Neck: supple. no JVD. Carotids 2+ bilat; no bruits. No lymphadenopathy or thryomegaly appreciated. Cor: PMI nondisplaced. Regular rate & rhythm. No rubs, gallops or murmurs. Lungs: clear Abdomen: soft, nontender, nondistended. No hepatosplenomegaly. No bruits or masses. Good bowel sounds. Extremities: no cyanosis, clubbing, rash, trace edema Neuro: alert & orientedx3, cranial nerves grossly intact. moves all 4 extremities w/o difficulty. Affect pleasant   ASSESSMENT & PLAN: 1. Left Breast Cancer -I reviewed echos personally. EF and Doppler parameters stable. No HF on exam. Continue Herceptin.    2. HTN  - BP elevated.  - Restart spiro 12.5 to help with HTN, hypokalemia and mild LE edema - I have asked her to get BP cuff to follow BP at home and bring me BP log at next  visit  3. LE Edema - restart spiro.   Glori Bickers, MD  3:27 PM

## 2018-12-06 NOTE — Addendum Note (Signed)
Encounter addended by: Jolaine Artist, MD on: 12/06/2018 4:50 PM  Actions taken: Clinical Note Signed

## 2018-12-09 ENCOUNTER — Ambulatory Visit (INDEPENDENT_AMBULATORY_CARE_PROVIDER_SITE_OTHER): Payer: 59 | Admitting: Bariatrics

## 2018-12-09 ENCOUNTER — Other Ambulatory Visit: Payer: Self-pay

## 2018-12-09 ENCOUNTER — Encounter (INDEPENDENT_AMBULATORY_CARE_PROVIDER_SITE_OTHER): Payer: Self-pay | Admitting: Bariatrics

## 2018-12-09 VITALS — BP 137/63 | HR 59 | Temp 98.5°F | Ht 65.0 in | Wt 167.0 lb

## 2018-12-09 DIAGNOSIS — E559 Vitamin D deficiency, unspecified: Secondary | ICD-10-CM | POA: Diagnosis not present

## 2018-12-09 DIAGNOSIS — E669 Obesity, unspecified: Secondary | ICD-10-CM

## 2018-12-09 DIAGNOSIS — R7303 Prediabetes: Secondary | ICD-10-CM | POA: Diagnosis not present

## 2018-12-09 DIAGNOSIS — Z6830 Body mass index (BMI) 30.0-30.9, adult: Secondary | ICD-10-CM

## 2018-12-14 ENCOUNTER — Encounter (INDEPENDENT_AMBULATORY_CARE_PROVIDER_SITE_OTHER): Payer: Self-pay | Admitting: Bariatrics

## 2018-12-14 NOTE — Progress Notes (Signed)
Office: [phone]  /  Fax: [phone]   HPI:   Chief Complaint: OBESITY Christy Serrano is here to discuss her progress with her obesity treatment plan. She is on the Category 3 plan and journaling 1500  Calories and 90 grams of protein and is following her eating plan approximately 50% of the time. She states she is walking 30-45 minutes 3-4 times per week. Christy Serrano's weight has stayed the same. She reports her taste is better and she is doing well with her water intake. Her weight is 167 lb (75.8 kg) today and has not lost weight since her last visit. She has lost 28 lbs since starting treatment with [place].  Pre-Diabetes Christy Serrano has a diagnosis of prediabetes based on her elevated Hgb A1c and was informed this puts her at greater risk of developing diabetes. Last A1c 5.7 on 10/20/2018. She is not taking metformin currently and continues to work on diet and exercise to decrease risk of diabetes. She denies nausea or hypoglycemia. No polyphagia.  Vitamin D deficiency Christy Serrano has a diagnosis of Vitamin D deficiency. She is currently taking OTC Vit D and denies nausea, vomiting or muscle weakness.  ASSESSMENT AND PLAN:  Prediabetes  Vitamin D deficiency  Class 1 obesity with serious comorbidity and body mass index (BMI) of 30.0 to 30.9 in adult, unspecified obesity type - Starting BMI greater then 30  PLAN:  Pre-Diabetes Christy Serrano will continue to work on weight loss, exercise, and decreasing simple carbohydrates in her diet to help decrease the risk of diabetes. We dicussed metformin including benefits and risks. She was informed that eating too many simple carbohydrates or too many calories at one sitting increases the likelihood of GI side effects. [name] was instructed to decrease carbohydrates, increase protein, and continue activity. She will follow-up with [place] as directed to monitor her progress.  Vitamin D Deficiency Christy Serrano was informed that low Vitamin D levels contributes to  fatigue and are associated with obesity, breast, and colon cancer. She agrees to continue taking OTC Vit D and will follow-up for routine testing of Vitamin D, at least 2-3 times per year. She was informed of the risk of over-replacement of Vitamin D and agrees to not increase her dose unless she discusses this with [place] first. Christy Serrano agrees to follow-up with our clinic in 2 weeks.  I spent > than 50% of the 15 minute visit on counseling as documented in the note.  Obesity Christy Serrano is currently in the action stage of change. As such, her goal is to continue with weight loss efforts. She has agreed to follow the Category 3 plan and journal 1500 calories + 90 grams of protein. Christy Serrano will work on meal planning and intentional eating. She was given handout on [employer] and was instructed on using My Fitness Pal. [name] has been instructed to continue walking and wear her Fit Bit for weight loss and overall health benefits. We discussed the following Behavioral Modification Strategies today: increasing lean protein intake, decreasing simple carbohydrates, increasing vegetables, increase H20 intake, decrease eating out, no skipping meals, work on meal planning and easy cooking plans, keeping healthy foods in the home, and planning for success.  Christy Serrano has agreed to follow-up with our clinic in 2 weeks. She was informed of the importance of frequent follow-up visits to maximize her success with intensive lifestyle modifications for her multiple health conditions.  ALLERGIES: Allergies  Allergen Reactions  . Oxycodone-Acetaminophen Other (See Comments) and Palpitations    tachycardia   . Gabapentin Swelling  Lower extremity edema  . Amoxicillin Rash    DID THE REACTION INVOLVE: Swelling of the face/tongue/throat, SOB, or low BP? No Sudden or severe rash/hives, skin peeling, or the inside of the mouth or nose? No Did it require medical treatment? No When did it last happen?in her  28's If all above answers are "NO", may proceed with cephalosporin use.     MEDICATIONS: Current Outpatient Medications on File Prior to Visit  Medication Sig Dispense Refill  . acetaminophen (TYLENOL) 500 MG tablet Take 1,000 mg by mouth every 6 (six) hours as needed for mild pain.    Marland Kitchen albuterol (PROVENTIL HFA;VENTOLIN HFA) 108 (90 Base) MCG/ACT inhaler Inhale into the lungs every 6 (six) hours as needed for wheezing or shortness of breath.    . budesonide-formoterol (SYMBICORT) 160-4.5 MCG/ACT inhaler Inhale 2 puffs into the lungs 2 (two) times daily.    . bumetanide (BUMEX) 1 MG tablet Take 1 mg by mouth 2 (two) times daily as needed.    . Calcium-Phosphorus-Vitamin D (CITRACAL +D3 PO) Take 1,200 mcg by mouth.    . cholecalciferol (VITAMIN D) 1000 units tablet Take 1,000 Units by mouth daily.    Marland Kitchen estradiol (VIVELLE-DOT) 0.05 MG/24HR patch     . gabapentin (NEURONTIN) 100 MG capsule Take 1 capsule (100 mg total) by mouth daily. 90 capsule 3  . HYDROcodone-acetaminophen (NORCO/VICODIN) 5-325 MG tablet Take 1 tablet by mouth every 6 (six) hours as needed for moderate pain or severe pain. 30 tablet 0  . Multiple Vitamin (MULTIVITAMIN) capsule Take 1 capsule by mouth daily.    Marland Kitchen spironolactone (ALDACTONE) 25 MG tablet Take 0.5 tablets (12.5 mg total) by mouth daily. 45 tablet 3   No current facility-administered medications on file prior to visit.     PAST MEDICAL HISTORY: Past Medical History:  Diagnosis Date  . Cancer ([place])    left breast  . Chronic bronchitis ([place])   . Family history of breast cancer   . Family history of ovarian cancer   . Family history of prostate cancer   . GERD (gastroesophageal reflux disease)    due to chemotherapy  . History of radiation therapy 09/02/18- 10/14/18   Left Breast 25 fx for a total of 50 Gy, Left SCV, PA 25 fractions for a total of 50 Gy, Left Breast boost 5 fractions, for a total of 10 Gy.   . Lactose intolerance   . PONV  (postoperative nausea and vomiting)     PAST SURGICAL HISTORY: Past Surgical History:  Procedure Laterality Date  . BREAST LUMPECTOMY WITH RADIOACTIVE SEED AND AXILLARY LYMPH NODE DISSECTION Left 07/12/2018   Procedure: LEFT BREAST RADIOACTIVE SEED X 3 LUMPECTOMY X 3  AND AXILLARY LYMPH NODE DISSECTION;  Surgeon: Jovita Kussmaul, MD;  Location: [facility];  Service: General;  Laterality: Left;  . PORTACATH PLACEMENT Right 03/03/2018   Procedure: INSERTION PORT-A-CATH;  Surgeon: Jovita Kussmaul, MD;  Location: [facility];  Service: General;  Laterality: Right;  . ROTATOR CUFF REPAIR Right 2006  . TOTAL ABDOMINAL HYSTERECTOMY  2010    SOCIAL HISTORY: Social History   Tobacco Use  . Smoking status: Never Smoker  . Smokeless tobacco: Never Used  Substance Use Topics  . Alcohol use: No    Comment: quit long ago  . Drug use: No    FAMILY HISTORY: Family History  Problem Relation Age of Onset  . High blood pressure Mother   . Heart attack Mother  CHF  . High blood pressure Father   . Prostate cancer Father        dx mid 32s  . Breast cancer Sister 67       negative Invitae 46 gene panel  . Breast cancer Paternal Aunt        dx mid 16s  . Cancer Paternal Uncle        jaw cancer d. 64s  . Ovarian cancer Maternal Grandmother        d. 56s  . Cancer Paternal Grandmother        either stomach or ovarian cancer  . Breast cancer Sister 58       "negative 21 gene panel"   ROS: Review of Systems  Gastrointestinal: Negative for nausea and vomiting.  Musculoskeletal:       Negative for muscle weakness.  Endo/Heme/Allergies:       Negative for hypoglycemia. Negative for polyphagia.   PHYSICAL EXAM: Blood pressure 137/63, pulse (!) 59, temperature 98.5 F (36.9 C), temperature source Oral, height 5\' 5"  (1.651 m), weight 167 lb (75.8 kg), SpO2 100 %. Body mass index is 27.79 kg/m. Physical Exam Vitals signs reviewed.  Constitutional:       Appearance: Normal appearance. She is obese.  Cardiovascular:     Rate and Rhythm: Normal rate.     Pulses: Normal pulses.  Pulmonary:     Effort: Pulmonary effort is normal.     Breath sounds: Normal breath sounds.  Musculoskeletal: Normal range of motion.  Skin:    General: Skin is warm and dry.  Neurological:     Mental Status: She is alert and oriented to person, place, and time.  Psychiatric:        Behavior: Behavior normal.   RECENT LABS AND TESTS: BMET    Component Value Date/Time   NA 139 12/03/2018 0821   NA 138 12/22/2017 1235   K 3.8 12/03/2018 0821   CL 105 12/03/2018 0821   CO2 27 12/03/2018 0821   GLUCOSE 83 12/03/2018 0821   BUN 15 12/03/2018 0821   BUN 12 12/22/2017 1235   CREATININE 1.07 (H) 12/03/2018 0821   CALCIUM 9.0 12/03/2018 0821   GFRNONAA 56 (L) 12/03/2018 0821   GFRAA >60 12/03/2018 0821   Lab Results  Component Value Date   HGBA1C 5.7 (H) 10/20/2018   HGBA1C 5.6 05/31/2018   HGBA1C 6.1 (H) 12/22/2017   Lab Results  Component Value Date   INSULIN 4.8 05/31/2018   INSULIN 13.7 12/22/2017   CBC    Component Value Date/Time   WBC 3.0 (L) 12/03/2018 0821   WBC 3.9 (L) 08/20/2018 0845   RBC 3.49 (L) 12/03/2018 0821   HGB 10.5 (L) 12/03/2018 0821   HGB 12.3 12/22/2017 1235   HCT 32.5 (L) 12/03/2018 0821   HCT 38.2 12/22/2017 1235   PLT 164 12/03/2018 0821   MCV 93.1 12/03/2018 0821   MCV 92 12/22/2017 1235   MCH 30.1 12/03/2018 0821   MCHC 32.3 12/03/2018 0821   RDW 15.5 12/03/2018 0821   RDW 14.8 12/22/2017 1235   LYMPHSABS 0.9 12/03/2018 0821   LYMPHSABS 2.0 12/22/2017 1235   MONOABS 0.3 12/03/2018 0821   EOSABS 0.1 12/03/2018 0821   EOSABS 0.0 12/22/2017 1235   BASOSABS 0.0 12/03/2018 0821   BASOSABS 0.0 12/22/2017 1235   Iron/TIBC/Ferritin/ %Sat No results found for: IRON, TIBC, FERRITIN, IRONPCTSAT Lipid Panel     Component Value Date/Time   CHOL 176 12/22/2017 1235   TRIG  111 12/22/2017 1235   HDL 61 12/22/2017  1235   LDLCALC 93 12/22/2017 1235   Hepatic Function Panel     Component Value Date/Time   PROT 7.5 12/03/2018 0821   PROT 7.6 12/22/2017 1235   ALBUMIN 4.1 12/03/2018 0821   ALBUMIN 4.8 12/22/2017 1235   AST 18 12/03/2018 0821   ALT 15 12/03/2018 0821   ALKPHOS 83 12/03/2018 0821   BILITOT 0.6 12/03/2018 0821      Component Value Date/Time   TSH 3.644 03/10/2018 0538   TSH 1.930 12/22/2017 1235   Results for KEILY, ERTZ D (MRN [id number]) as of 12/14/2018 12:01  Ref. Range 10/20/2018 15:50  Vitamin D, 25-Hydroxy Latest Ref Range: 30.0 - 100.0 ng/mL 46.6   OBESITY BEHAVIORAL INTERVENTION VISIT  Today's visit was #14   Starting weight: 195 lbs Starting date: 12/22/2017 Today's weight: 167 lbs  Today's date: 12/09/2018 Total lbs lost to date: 28    12/09/2018  Height 5\' 5"  (1.651 m)  Weight 167 lb (75.8 kg)  BMI (Calculated) 27.79  BLOOD PRESSURE - SYSTOLIC 0000000  BLOOD PRESSURE - DIASTOLIC 63   Body Fat % 40 %  Total Body Water (lbs) 74.4 lbs   ASK: We discussed the diagnosis of obesity with Mackie Pai today and Cordia agreed to give [place] permission to discuss obesity behavioral modification therapy today.  ASSESS: Tenzin has the diagnosis of obesity and her BMI today is 27.8. Estephania is in the action stage of change.   ADVISE: Robinette was educated on the multiple health risks of obesity as well as the benefit of weight loss to improve her health. She was advised of the need for long term treatment and the importance of lifestyle modifications to improve her current health and to decrease her risk of future health problems.  AGREE: Multiple dietary modification options and treatment options were discussed and  Deziah agreed to follow the recommendations documented in the above note.  ARRANGE: Kaelah was educated on the importance of frequent visits to treat obesity as outlined per CMS and USPSTF guidelines and agreed to schedule her next follow up  appointment today.  Migdalia Dk, am acting as Location manager for [employer], DO  I have reviewed the above documentation for accuracy and completeness, and I agree with the above. -Jearld Lesch, DO

## 2018-12-16 ENCOUNTER — Encounter: Payer: Self-pay | Admitting: Hematology and Oncology

## 2018-12-17 ENCOUNTER — Other Ambulatory Visit: Payer: Self-pay | Admitting: Hematology and Oncology

## 2018-12-17 MED ORDER — HYDROCODONE-ACETAMINOPHEN 5-325 MG PO TABS: 1.0000 | ORAL_TABLET | Freq: Four times a day (QID) | ORAL | 0 refills | Status: DC | PRN

## 2018-12-22 ENCOUNTER — Ambulatory Visit (INDEPENDENT_AMBULATORY_CARE_PROVIDER_SITE_OTHER): Payer: 59 | Admitting: Bariatrics

## 2018-12-22 ENCOUNTER — Encounter (INDEPENDENT_AMBULATORY_CARE_PROVIDER_SITE_OTHER): Payer: Self-pay | Admitting: Bariatrics

## 2018-12-22 ENCOUNTER — Other Ambulatory Visit: Payer: Self-pay

## 2018-12-22 VITALS — BP 111/57 | HR 69 | Temp 97.8°F | Ht 65.0 in | Wt 166.0 lb

## 2018-12-22 DIAGNOSIS — E669 Obesity, unspecified: Secondary | ICD-10-CM

## 2018-12-22 DIAGNOSIS — E559 Vitamin D deficiency, unspecified: Secondary | ICD-10-CM

## 2018-12-22 DIAGNOSIS — R7303 Prediabetes: Secondary | ICD-10-CM | POA: Diagnosis not present

## 2018-12-22 DIAGNOSIS — Z6830 Body mass index (BMI) 30.0-30.9, adult: Secondary | ICD-10-CM

## 2018-12-22 NOTE — Progress Notes (Signed)
Office: [phone]  /  Fax: [phone]   HPI:   Chief Complaint: OBESITY Christy Serrano is here to discuss her progress with her obesity treatment plan. She is on the keep a food journal with 1500 calories and 90 grams of protein daily or follow the Category 3 plan and is following her eating plan approximately 100 % of the time. She states she is walking for 45-60 minutes 3-4 times per week. Christy Serrano is down another pound.  Her weight is 166 lb (75.3 kg) today and has had a weight loss of 1 pound over a period of 2 weeks since her last visit. She has lost 29 lbs since starting treatment with [place].  Pre-Diabetes Christy Serrano has a diagnosis of pre-diabetes based on her elevated Hgb A1c and was informed this puts her at greater risk of developing diabetes. She is not taking metformin currently and continues to work on diet and exercise to decrease risk of diabetes. She denies polyphagia.  Vitamin D Deficiency Christy Serrano has a diagnosis of vitamin D deficiency. She is taking multivitamins daily and Vit D 1,000 IU daily. She denies nausea, vomiting or muscle weakness.  ASSESSMENT AND PLAN:  Prediabetes  Vitamin D deficiency  Class 1 obesity with serious comorbidity and body mass index (BMI) of 30.0 to 30.9 in adult, unspecified obesity type - BMI greater than 30 at start of program   PLAN:  Pre-Diabetes Christy Serrano will continue to work on weight loss, exercise, increase protein, and decreasing simple carbohydrates in her diet to help decrease the risk of diabetes. We dicussed metformin including benefits and risks. She was informed that eating too many simple carbohydrates or too many calories at one sitting increases the likelihood of GI side effects. Christy Serrano agrees to follow up with our clinic in 2 weeks as directed to monitor her progress.  Vitamin D Deficiency Christy Serrano was informed that low vitamin D levels contributes to fatigue and are associated with obesity, breast, and colon cancer.  Christy Serrano agrees to continue taking multivitamins and Vit D, and will follow up for routine testing of vitamin D, at least 2-3 times per year. She was informed of the risk of over-replacement of vitamin D and agrees to not increase her dose unless she discusses this with [place] first. Christy Serrano agrees to follow up with our clinic in 2 weeks.  I spent > than 50% of the 15 minute visit on counseling as documented in the note.  Obesity Christy Serrano is currently in the action stage of change. As such, her goal is to continue with weight loss efforts She has agreed to keep a food journal with 1500 calories and 90 grams of protein daily Christy Serrano has been instructed to work up to a goal of 150 minutes of combined cardio and strengthening exercise per week or continue walking daily for weight loss and overall health benefits. We discussed the following Behavioral Modification Strategies today: increasing lean protein intake, decreasing simple carbohydrates, increasing vegetables, decrease eating out, work on meal planning and easy cooking plans, increase H20 intake, no skipping meals, keeping healthy foods in the home, and planning for success   Christy Serrano has agreed to follow up with our clinic in 2 weeks. She was informed of the importance of frequent follow up visits to maximize her success with intensive lifestyle modifications for her multiple health conditions.  ALLERGIES: Allergies  Allergen Reactions  . Oxycodone-Acetaminophen Other (See Comments) and Palpitations    tachycardia   . Gabapentin Swelling    Lower extremity edema  .  Amoxicillin Rash    DID THE REACTION INVOLVE: Swelling of the face/tongue/throat, SOB, or low BP? No Sudden or severe rash/hives, skin peeling, or the inside of the mouth or nose? No Did it require medical treatment? No When did it last happen?in her 24's If all above answers are "NO", may proceed with cephalosporin use.     MEDICATIONS: Current Outpatient  Medications on File Prior to Visit  Medication Sig Dispense Refill  . acetaminophen (TYLENOL) 500 MG tablet Take 1,000 mg by mouth every 6 (six) hours as needed for mild pain.    Marland Kitchen albuterol (PROVENTIL HFA;VENTOLIN HFA) 108 (90 Base) MCG/ACT inhaler Inhale into the lungs every 6 (six) hours as needed for wheezing or shortness of breath.    . budesonide-formoterol (SYMBICORT) 160-4.5 MCG/ACT inhaler Inhale 2 puffs into the lungs 2 (two) times daily.    . bumetanide (BUMEX) 1 MG tablet Take 1 mg by mouth 2 (two) times daily as needed.    . Calcium-Phosphorus-Vitamin D (CITRACAL +D3 PO) Take 1,200 mcg by mouth.    . cholecalciferol (VITAMIN D) 1000 units tablet Take 1,000 Units by mouth daily.    Marland Kitchen estradiol (VIVELLE-DOT) 0.05 MG/24HR patch     . gabapentin (NEURONTIN) 100 MG capsule Take 1 capsule (100 mg total) by mouth daily. 90 capsule 3  . HYDROcodone-acetaminophen (NORCO/VICODIN) 5-325 MG tablet Take 1 tablet by mouth every 6 (six) hours as needed for moderate pain or severe pain. 30 tablet 0  . Multiple Vitamin (MULTIVITAMIN) capsule Take 1 capsule by mouth daily.    Marland Kitchen spironolactone (ALDACTONE) 25 MG tablet Take 0.5 tablets (12.5 mg total) by mouth daily. 45 tablet 3   No current facility-administered medications on file prior to visit.     PAST MEDICAL HISTORY: Past Medical History:  Diagnosis Date  . Cancer ([place])    left breast  . Chronic bronchitis ([place])   . Family history of breast cancer   . Family history of ovarian cancer   . Family history of prostate cancer   . GERD (gastroesophageal reflux disease)    due to chemotherapy  . History of radiation therapy 09/02/18- 10/14/18   Left Breast 25 fx for a total of 50 Gy, Left SCV, PA 25 fractions for a total of 50 Gy, Left Breast boost 5 fractions, for a total of 10 Gy.   . Lactose intolerance   . PONV (postoperative nausea and vomiting)     PAST SURGICAL HISTORY: Past Surgical History:  Procedure Laterality Date  . BREAST  LUMPECTOMY WITH RADIOACTIVE SEED AND AXILLARY LYMPH NODE DISSECTION Left 07/12/2018   Procedure: LEFT BREAST RADIOACTIVE SEED X 3 LUMPECTOMY X 3  AND AXILLARY LYMPH NODE DISSECTION;  Surgeon: Jovita Kussmaul, MD;  Location: [facility];  Service: General;  Laterality: Left;  . PORTACATH PLACEMENT Right 03/03/2018   Procedure: INSERTION PORT-A-CATH;  Surgeon: Jovita Kussmaul, MD;  Location: [facility];  Service: General;  Laterality: Right;  . ROTATOR CUFF REPAIR Right 2006  . TOTAL ABDOMINAL HYSTERECTOMY  2010    SOCIAL HISTORY: Social History   Tobacco Use  . Smoking status: Never Smoker  . Smokeless tobacco: Never Used  Substance Use Topics  . Alcohol use: No    Comment: quit long ago  . Drug use: No    FAMILY HISTORY: Family History  Problem Relation Age of Onset  . High blood pressure Mother   . Heart attack Mother  CHF  . High blood pressure Father   . Prostate cancer Father        dx mid 50s  . Breast cancer Sister 59       negative [employer] 46 gene panel  . Breast cancer Paternal Aunt        dx mid 80s  . Cancer Paternal Uncle        jaw cancer d. 51s  . Ovarian cancer Maternal Grandmother        d. 26s  . Cancer Paternal Grandmother        either stomach or ovarian cancer  . Breast cancer Sister 15       "negative 21 gene panel"    ROS: Review of Systems  Constitutional: Positive for weight loss.  Gastrointestinal: Negative for nausea and vomiting.  Musculoskeletal:       Negative muscle weakness  Endo/Heme/Allergies:       Negative polyphagia    PHYSICAL EXAM: Blood pressure (!) 111/57, pulse 69, temperature 97.8 F (36.6 C), temperature source Oral, height 5\' 5"  (1.651 m), weight 166 lb (75.3 kg), SpO2 98 %. Body mass index is 27.62 kg/m. Physical Exam Vitals signs reviewed.  Constitutional:      Appearance: Normal appearance. She is obese.  Cardiovascular:     Rate and Rhythm: Normal rate.     Pulses: Normal  pulses.  Pulmonary:     Effort: Pulmonary effort is normal.     Breath sounds: Normal breath sounds.  Musculoskeletal: Normal range of motion.  Skin:    General: Skin is warm and dry.  Neurological:     Mental Status: She is alert and oriented to person, place, and time.  Psychiatric:        Mood and Affect: Mood normal.        Behavior: Behavior normal.     RECENT LABS AND TESTS: BMET    Component Value Date/Time   NA 139 12/03/2018 0821   NA 138 12/22/2017 1235   K 3.8 12/03/2018 0821   CL 105 12/03/2018 0821   CO2 27 12/03/2018 0821   GLUCOSE 83 12/03/2018 0821   BUN 15 12/03/2018 0821   BUN 12 12/22/2017 1235   CREATININE 1.07 (H) 12/03/2018 0821   CALCIUM 9.0 12/03/2018 0821   GFRNONAA 56 (L) 12/03/2018 0821   GFRAA >60 12/03/2018 0821   Lab Results  Component Value Date   HGBA1C 5.7 (H) 10/20/2018   HGBA1C 5.6 05/31/2018   HGBA1C 6.1 (H) 12/22/2017   Lab Results  Component Value Date   INSULIN 4.8 05/31/2018   INSULIN 13.7 12/22/2017   CBC    Component Value Date/Time   WBC 3.0 (L) 12/03/2018 0821   WBC 3.9 (L) 08/20/2018 0845   RBC 3.49 (L) 12/03/2018 0821   HGB 10.5 (L) 12/03/2018 0821   HGB 12.3 12/22/2017 1235   HCT 32.5 (L) 12/03/2018 0821   HCT 38.2 12/22/2017 1235   PLT 164 12/03/2018 0821   MCV 93.1 12/03/2018 0821   MCV 92 12/22/2017 1235   MCH 30.1 12/03/2018 0821   MCHC 32.3 12/03/2018 0821   RDW 15.5 12/03/2018 0821   RDW 14.8 12/22/2017 1235   LYMPHSABS 0.9 12/03/2018 0821   LYMPHSABS 2.0 12/22/2017 1235   MONOABS 0.3 12/03/2018 0821   EOSABS 0.1 12/03/2018 0821   EOSABS 0.0 12/22/2017 1235   BASOSABS 0.0 12/03/2018 0821   BASOSABS 0.0 12/22/2017 1235   Iron/TIBC/Ferritin/ %Sat No results found for: IRON, TIBC, FERRITIN, IRONPCTSAT Lipid  Panel     Component Value Date/Time   CHOL 176 12/22/2017 1235   TRIG 111 12/22/2017 1235   HDL 61 12/22/2017 1235   LDLCALC 93 12/22/2017 1235   Hepatic Function Panel     Component  Value Date/Time   PROT 7.5 12/03/2018 0821   PROT 7.6 12/22/2017 1235   ALBUMIN 4.1 12/03/2018 0821   ALBUMIN 4.8 12/22/2017 1235   AST 18 12/03/2018 0821   ALT 15 12/03/2018 0821   ALKPHOS 83 12/03/2018 0821   BILITOT 0.6 12/03/2018 0821      Component Value Date/Time   TSH 3.644 03/10/2018 0538   TSH 1.930 12/22/2017 1235      OBESITY BEHAVIORAL INTERVENTION VISIT  Today's visit was # 15   Starting weight: 195 lbs Starting date: 12/22/17 Today's weight : 166 lbs Today's date: 12/22/2018 Total lbs lost to date: 55    ASK: We discussed the diagnosis of obesity with Christy Serrano today and Christy Serrano agreed to give [place] permission to discuss obesity behavioral modification therapy today.  ASSESS: Christy Serrano has the diagnosis of obesity and her BMI today is 27.62 Christy Serrano is in the action stage of change   ADVISE: Christy Serrano was educated on the multiple health risks of obesity as well as the benefit of weight loss to improve her health. She was advised of the need for long term treatment and the importance of lifestyle modifications to improve her current health and to decrease her risk of future health problems.  AGREE: Multiple dietary modification options and treatment options were discussed and  Christy Serrano agreed to follow the recommendations documented in the above note.  ARRANGE: Christy Serrano was educated on the importance of frequent visits to treat obesity as outlined per CMS and USPSTF guidelines and agreed to schedule her next follow up appointment today.  Christy Serrano, am acting as transcriptionist for [employer], DO  I have reviewed the above documentation for accuracy and completeness, and I agree with the above. -Jearld Lesch, DO

## 2018-12-23 NOTE — Progress Notes (Signed)
Patient Care Team: Velna Hatchet, MD as PCP - General (Internal Medicine) Jovita Kussmaul, MD as Consulting Physician (General Surgery) Nicholas Lose, MD as Consulting Physician (Hematology and Oncology) Eppie Gibson, MD as Attending Physician (Radiation Oncology)  DIAGNOSIS:    ICD-10-CM   1. Carcinoma of upper-outer quadrant of left breast in female, estrogen receptor negative ([place])  C50.412    Z17.1     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 breast ([place])  01/18/2018 Initial Diagnosis   Screening mammogram detected microcalcifications and asymmetry, left breast segmental microcalcifications UOQ 1.5 x 4.3 x 5.4 cm biopsy-proven IDC with DCIS with LV I, grade 3, ER 0%, PR 0%, Ki-67 30%, HER-2 +3+ by IHC; hypoechoic mass 2 o'clock position 1.2 x 1.3 x 1.7 cm biopsy IDC, LV I present, grade 2-3, ER 0%, PR 0%, Ki-67 40%, HER-2 +3+, left axillary lymph node positive (3 nodes noted by [place]) T3 N1 stage IIIA   02/03/2018 Cancer Staging   Staging form: Breast, AJCC 8th Edition - Clinical: Stage IIIA (cT3, cN1, cM0, G3, ER-, PR-, HER2+) - Signed by Nicholas Lose, MD on 02/03/2018   02/20/2018 Genetic Testing   No pathogenic variants identified on the [facility] CancerNext Expanded + RNA insight panel. Genes Analyzed (67 total): AIP, ALK, APC*, ATM*, BAP1, BARD1, BLM, BMPR1A, BRCA1*, BRCA2*, BRIP1*, CDH1*, CDK4, CDKN1B, CDKN2A, CHEK2*, DICER1, FANCC, FH, FLCN, GALNT12, HOXB13, MAX, MEN1, MET, MLH1*, MRE11A, MSH2*, MSH6*, MUTYH*, NBN, NF1*, NF2, PALB2*, PHOX2B, PMS2*, POLD1, POLE, POT1, PRKAR1A, PTCH1, PTEN*, RAD50, RAD51C*, RAD51D*, RB1, RET, SDHA, SDHAF2, SDHB, SDHC, SDHD, SMAD4, SMARCA4, SMARCB1, SMARCE1, STK11, SUFU, TMEM127, TP53*, TSC1, TSC2, VHL and XRCC2 (sequencing and deletion/duplication); MITF (sequencing only); EPCAM and GREM1 (deletion/duplication only). DNA and RNA analyses performed for * genes. The report date is 02/20/2018.   03/04/2018 -   Neo-Adjuvant Chemotherapy   Neoadjuvant chemotherapy with [facility] Perjeta   03/08/2018 - 03/10/2018 Hospital Admission   Admitted for recurrent syncope with loss of consciousness for up to 5 minutes with loss of bladder, no abnormalities identified.  MRI brain, carotid ultrasound, EEG studies were negative.   06/17/2018 Breast MRI   No suspicious residual enhancement identified in the left breast, compatible with treatment response, interval decrease in size of left axillary lymph nodes   07/12/2018 Surgery   Lumpectomy (Dr. Marlou Starks): complete treatment response, no residual carcinoma present in breast or 11/11 lymph nodes.    09/02/2018 - 10/14/2018 Radiation Therapy   Adjuvant XRT   Carcinoma of upper-outer quadrant of left breast in female, estrogen receptor negative ([place])  07/12/2018 Initial Diagnosis   Carcinoma of upper-outer quadrant of left breast in female, estrogen receptor negative ([place])   08/11/2018 Cancer Staging   Staging form: Breast, AJCC 8th Edition - Pathologic: No Stage Recommended (ypT0, pN0) - Signed by Eppie Gibson, MD on 08/11/2018   08/11/2018 Cancer Staging   Staging form: Breast, AJCC 8th Edition - Clinical: Stage IIIA (cT3, cN1, cM0, G3, ER-, PR-, HER2+) - Signed by Eppie Gibson, MD on 08/11/2018   08/20/2018 -  Chemotherapy   The patient had trastuzumab (HERCEPTIN) 450 mg in sodium chloride 0.9 % 250 mL chemo infusion, 462 mg (100 % of original dose 6 mg/kg), Intravenous,  Once, 5 of 5 cycles Dose modification: 6 mg/kg (original dose 6 mg/kg, Cycle 1, Reason: Provider Judgment), 450 mg (original dose 450 mg, Cycle 6, Reason: Other (see comments), Comment: pt assist for Herceptin) Administration: 450 mg (08/20/2018), 450 mg (09/10/2018), 450  mg (10/01/2018), 450 mg (10/22/2018), 450 mg (12/03/2018) pertuzumab (PERJETA) 420 mg in sodium chloride 0.9 % 250 mL chemo infusion, 420 mg (100 % of original dose 420 mg), Intravenous, Once, 6 of 10 cycles Dose modification: 420 mg  (original dose 420 mg, Cycle 1, Reason: Provider Judgment) Administration: 420 mg (08/20/2018), 420 mg (09/10/2018), 420 mg (11/12/2018), 420 mg (10/01/2018), 420 mg (10/22/2018), 420 mg (12/03/2018) trastuzumab-dkst (OGIVRI) 450 mg in sodium chloride 0.9 % 250 mL chemo infusion, 450 mg (100 % of original dose 450 mg), Intravenous,  Once, 1 of 5 cycles Dose modification: 450 mg (original dose 450 mg, Cycle 5, Reason: Other (see comments), Comment: Biosimilar Conversion), 6 mg/kg (original dose 6 mg/kg, Cycle 7, Reason: Other (see comments), Comment: to biosimalar) Administration: 450 mg (11/12/2018)  for chemotherapy treatment.      CHIEF COMPLIANT: Follow-up on Herceptin Perjeta maintenance   INTERVAL HISTORY: Christy Serrano is a 63 y.o. with above-mentioned history of left breast cancer treated with neoadjuvant chemotherapy, lumpectomy, radiation, andwho is currently undergoing Herceptin Perjeta maintenance. Echo on 12/06/18 showed an ejection fraction of 55-60%. She presents to the clinic todayfor treatment.   REVIEW OF SYSTEMS:   Constitutional: Denies fevers, chills or abnormal weight loss Eyes: Denies blurriness of vision Ears, nose, mouth, throat, and face: Denies mucositis or sore throat Respiratory: Denies cough, dyspnea or wheezes Cardiovascular: Denies palpitation, chest discomfort Gastrointestinal: Denies nausea, heartburn or change in bowel habits Skin: Denies abnormal skin rashes Lymphatics: Denies new lymphadenopathy or easy bruising Neurological: Denies numbness, tingling or new weaknesses Behavioral/Psych: Mood is stable, no new changes  Extremities: No lower extremity edema Breast: denies any pain or lumps or nodules in either breasts All other systems were reviewed with the patient and are negative.  I have reviewed the past medical history, past surgical history, social history and family history with the patient and they are unchanged from previous note.  ALLERGIES:   is allergic to oxycodone-acetaminophen; gabapentin; and amoxicillin.  MEDICATIONS:  Current Outpatient Medications  Medication Sig Dispense Refill  . acetaminophen (TYLENOL) 500 MG tablet Take 1,000 mg by mouth every 6 (six) hours as needed for mild pain.    Marland Kitchen albuterol (PROVENTIL HFA;VENTOLIN HFA) 108 (90 Base) MCG/ACT inhaler Inhale into the lungs every 6 (six) hours as needed for wheezing or shortness of breath.    . budesonide-formoterol (SYMBICORT) 160-4.5 MCG/ACT inhaler Inhale 2 puffs into the lungs 2 (two) times daily.    . bumetanide (BUMEX) 1 MG tablet Take 1 mg by mouth 2 (two) times daily as needed.    . Calcium-Phosphorus-Vitamin D (CITRACAL +D3 PO) Take 1,200 mcg by mouth.    . cholecalciferol (VITAMIN D) 1000 units tablet Take 1,000 Units by mouth daily.    Marland Kitchen estradiol (VIVELLE-DOT) 0.05 MG/24HR patch     . gabapentin (NEURONTIN) 100 MG capsule Take 1 capsule (100 mg total) by mouth daily. 90 capsule 3  . HYDROcodone-acetaminophen (NORCO/VICODIN) 5-325 MG tablet Take 1 tablet by mouth every 6 (six) hours as needed for moderate pain or severe pain. 30 tablet 0  . Multiple Vitamin (MULTIVITAMIN) capsule Take 1 capsule by mouth daily.    Marland Kitchen spironolactone (ALDACTONE) 25 MG tablet Take 0.5 tablets (12.5 mg total) by mouth daily. 45 tablet 3   No current facility-administered medications for this visit.    Facility-Administered Medications Ordered in Other Visits  Medication Dose Route Frequency Provider Last Rate Last Dose  . sodium chloride flush (NS) 0.9 % injection 10 mL  10 mL Intracatheter PRN Nicholas Lose, MD   10 mL at 12/24/18 0907    PHYSICAL EXAMINATION: ECOG PERFORMANCE STATUS: 1 - Symptomatic but completely ambulatory  There were no vitals filed for this visit. There were no vitals filed for this visit.  GENERAL: alert, no distress and comfortable SKIN: skin color, texture, turgor are normal, no rashes or significant lesions EYES: normal, Conjunctiva are pink  and non-injected, sclera clear OROPHARYNX: no exudate, no erythema and lips, buccal mucosa, and tongue normal  NECK: supple, thyroid normal size, non-tender, without nodularity LYMPH: no palpable lymphadenopathy in the cervical, axillary or inguinal LUNGS: clear to auscultation and percussion with normal breathing effort HEART: regular rate & rhythm and no murmurs and no lower extremity edema ABDOMEN: abdomen soft, non-tender and normal bowel sounds MUSCULOSKELETAL: no cyanosis of digits and no clubbing  NEURO: alert & oriented x 3 with fluent speech, no focal motor/sensory deficits EXTREMITIES: No lower extremity edema  LABORATORY DATA:  I have reviewed the data as listed CMP Latest Ref Rng & Units 12/03/2018 11/12/2018 10/22/2018  Glucose 70 - 99 mg/dL 83 84 85  BUN 8 - 23 mg/dL 15 18 16   Creatinine 0.44 - 1.00 mg/dL 1.07(H) 0.99 1.01(H)  Sodium 135 - 145 mmol/L 139 139 140  Potassium 3.5 - 5.1 mmol/L 3.8 3.9 3.9  Chloride 98 - 111 mmol/L 105 104 106  CO2 22 - 32 mmol/L 27 25 28   Calcium 8.9 - 10.3 mg/dL 9.0 9.4 9.7  Total Protein 6.5 - 8.1 g/dL 7.5 7.5 7.4  Total Bilirubin 0.3 - 1.2 mg/dL 0.6 0.8 0.6  Alkaline Phos 38 - 126 U/L 83 83 87  AST 15 - 41 U/L 18 18 16   ALT 0 - 44 U/L 15 12 12     Lab Results  Component Value Date   WBC 3.6 (L) 12/24/2018   HGB 10.4 (L) 12/24/2018   HCT 32.2 (L) 12/24/2018   MCV 92.3 12/24/2018   PLT 161 12/24/2018   NEUTROABS 2.4 12/24/2018    ASSESSMENT & PLAN:  Carcinoma of upper-outer quadrant of left breast in female, estrogen receptor negative ([place]) 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, grade 3, ER 0%, PR 0%, Ki-67 30%, HER-2 +3+ by IHC; hypoechoic mass 2 o'clock position 1.2 x 1.3 x 1.7 cm biopsy IDC, LV I present, grade 2-3, ER 0%, PR 0%, Ki-67 40%, HER-2 +3+, left axillary lymph node positive (3 nodes  noted by [place]) T3 N1 stage IIIA  Treatment plan 1.Neoadjuvant chemotherapy with [place] completed 3/26/2020followed by Herceptin Perjeta maintenance  2.lumpectomywith axillary lymphnode dissection: 07/12/2018 3.Adjuvant radiation therapy --------------------------------------------------------------------------------------------------------------------------------------------------- 07/12/2018:Lumpectomy (Dr. Marlou Starks): complete treatment response, no residual carcinoma present in breast or 11/11 lymph nodes.  Treatment plan: 1.Adjuvant radiation therapy: Slightly delayed because of COVID-19.  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   Left shoulder pain: Getting steroid injection.  Lower extremity edema: On as needed Bumex, much improved Breast cancer surveillance: I ordered mammogram to be done in the next 2 weeks. She lost her job at [employer] and is looking for another job.  Patient will come back every 3 weeks for Herceptin Perjeta and every 6 weeks for follow-up with me.  No orders of the defined types were placed in this encounter.  The patient has a good understanding of the overall plan. she agrees with it. she will call  with any problems that may develop before the next visit here.  Nicholas Lose, MD 12/24/2018  Julious Oka Dorshimer am acting as scribe for Dr. Nicholas Lose.  I have reviewed the above documentation for accuracy and completeness, and I agree with the above.

## 2018-12-24 ENCOUNTER — Other Ambulatory Visit: Payer: Self-pay

## 2018-12-24 ENCOUNTER — Inpatient Hospital Stay: Payer: Managed Care, Other (non HMO)

## 2018-12-24 ENCOUNTER — Inpatient Hospital Stay: Payer: Managed Care, Other (non HMO) | Attending: Hematology and Oncology

## 2018-12-24 ENCOUNTER — Inpatient Hospital Stay (HOSPITAL_BASED_OUTPATIENT_CLINIC_OR_DEPARTMENT_OTHER): Payer: Managed Care, Other (non HMO) | Admitting: Hematology and Oncology

## 2018-12-24 VITALS — BP 122/63 | HR 65 | Temp 98.1°F | Resp 16

## 2018-12-24 DIAGNOSIS — Z23 Encounter for immunization: Secondary | ICD-10-CM | POA: Insufficient documentation

## 2018-12-24 DIAGNOSIS — G620 Drug-induced polyneuropathy: Secondary | ICD-10-CM | POA: Insufficient documentation

## 2018-12-24 DIAGNOSIS — T451X5A Adverse effect of antineoplastic and immunosuppressive drugs, initial encounter: Secondary | ICD-10-CM | POA: Diagnosis not present

## 2018-12-24 DIAGNOSIS — R600 Localized edema: Secondary | ICD-10-CM | POA: Diagnosis not present

## 2018-12-24 DIAGNOSIS — C50412 Malignant neoplasm of upper-outer quadrant of left female breast: Secondary | ICD-10-CM | POA: Diagnosis not present

## 2018-12-24 DIAGNOSIS — Z79899 Other long term (current) drug therapy: Secondary | ICD-10-CM | POA: Diagnosis not present

## 2018-12-24 DIAGNOSIS — Z5112 Encounter for antineoplastic immunotherapy: Secondary | ICD-10-CM | POA: Insufficient documentation

## 2018-12-24 DIAGNOSIS — Z171 Estrogen receptor negative status [ER-]: Secondary | ICD-10-CM | POA: Insufficient documentation

## 2018-12-24 DIAGNOSIS — Z95828 Presence of other vascular implants and grafts: Secondary | ICD-10-CM

## 2018-12-24 DIAGNOSIS — Z923 Personal history of irradiation: Secondary | ICD-10-CM | POA: Insufficient documentation

## 2018-12-24 LAB — CBC WITH DIFFERENTIAL (CANCER CENTER ONLY)
Abs Immature Granulocytes: 0.01 10*3/uL (ref 0.00–0.07)
Basophils Absolute: 0 10*3/uL (ref 0.0–0.1)
Basophils Relative: 0 %
Eosinophils Absolute: 0.1 10*3/uL (ref 0.0–0.5)
Eosinophils Relative: 2 %
HCT: 32.2 % — ABNORMAL LOW (ref 36.0–46.0)
Hemoglobin: 10.4 g/dL — ABNORMAL LOW (ref 12.0–15.0)
Immature Granulocytes: 0 %
Lymphocytes Relative: 25 %
Lymphs Abs: 0.9 10*3/uL (ref 0.7–4.0)
MCH: 29.8 pg (ref 26.0–34.0)
MCHC: 32.3 g/dL (ref 30.0–36.0)
MCV: 92.3 fL (ref 80.0–100.0)
Monocytes Absolute: 0.3 10*3/uL (ref 0.1–1.0)
Monocytes Relative: 7 %
Neutro Abs: 2.4 10*3/uL (ref 1.7–7.7)
Neutrophils Relative %: 66 %
Platelet Count: 161 10*3/uL (ref 150–400)
RBC: 3.49 MIL/uL — ABNORMAL LOW (ref 3.87–5.11)
RDW: 15.2 % (ref 11.5–15.5)
WBC Count: 3.6 10*3/uL — ABNORMAL LOW (ref 4.0–10.5)
nRBC: 0 % (ref 0.0–0.2)

## 2018-12-24 LAB — CMP (CANCER CENTER ONLY)
ALT: 12 U/L (ref 0–44)
AST: 18 U/L (ref 15–41)
Albumin: 4.1 g/dL (ref 3.5–5.0)
Alkaline Phosphatase: 88 U/L (ref 38–126)
Anion gap: 9 (ref 5–15)
BUN: 19 mg/dL (ref 8–23)
CO2: 26 mmol/L (ref 22–32)
Calcium: 9.3 mg/dL (ref 8.9–10.3)
Chloride: 105 mmol/L (ref 98–111)
Creatinine: 1.12 mg/dL — ABNORMAL HIGH (ref 0.44–1.00)
GFR, Est AFR Am: >60 mL/min (ref >60)
GFR, Estimated: 53 mL/min — ABNORMAL LOW (ref >60)
Glucose, Bld: 87 mg/dL (ref 70–99)
Potassium: 4.2 mmol/L (ref 3.5–5.1)
Sodium: 140 mmol/L (ref 135–145)
Total Bilirubin: 0.5 mg/dL (ref 0.3–1.2)
Total Protein: 7.4 g/dL (ref 6.5–8.1)

## 2018-12-24 MED ORDER — ACETAMINOPHEN 325 MG PO TABS
650.0000 mg | ORAL_TABLET | Freq: Once | ORAL | Status: AC
  Administered 2018-12-24: 650 mg via ORAL

## 2018-12-24 MED ORDER — SODIUM CHLORIDE 0.9 % IV SOLN
420.0000 mg | Freq: Once | INTRAVENOUS | Status: AC
  Administered 2018-12-24: 12:00:00 420 mg via INTRAVENOUS
  Filled 2018-12-24: qty 14

## 2018-12-24 MED ORDER — SODIUM CHLORIDE 0.9% FLUSH
10.0000 mL | INTRAVENOUS | Status: DC | PRN
  Administered 2018-12-24: 10 mL
  Filled 2018-12-24: qty 10

## 2018-12-24 MED ORDER — TRASTUZUMAB CHEMO 150 MG IV SOLR
450.0000 mg | Freq: Once | INTRAVENOUS | Status: AC
  Administered 2018-12-24: 450 mg via INTRAVENOUS
  Filled 2018-12-24: qty 21.43

## 2018-12-24 MED ORDER — TRASTUZUMAB-DKST CHEMO 150 MG IV SOLR: 450.0000 mg | Freq: Once | INTRAVENOUS | Status: DC

## 2018-12-24 MED ORDER — INFLUENZA VAC SPLIT QUAD 0.5 ML IM SUSY
0.5000 mL | PREFILLED_SYRINGE | Freq: Once | INTRAMUSCULAR | Status: AC
  Administered 2018-12-24: 10:00:00 0.5 mL via INTRAMUSCULAR

## 2018-12-24 MED ORDER — HEPARIN SOD (PORK) LOCK FLUSH 100 UNIT/ML IV SOLN
500.0000 [IU] | Freq: Once | INTRAVENOUS | Status: AC | PRN
  Administered 2018-12-24: 500 [IU]
  Filled 2018-12-24: qty 5

## 2018-12-24 MED ORDER — INFLUENZA VAC SPLIT QUAD 0.5 ML IM SUSY
PREFILLED_SYRINGE | INTRAMUSCULAR | Status: AC
  Filled 2018-12-24: qty 0.5

## 2018-12-24 MED ORDER — SODIUM CHLORIDE 0.9 % IV SOLN
Freq: Once | INTRAVENOUS | Status: AC
  Administered 2018-12-24: 10:00:00 via INTRAVENOUS
  Filled 2018-12-24: qty 250

## 2018-12-24 MED ORDER — ACETAMINOPHEN 325 MG PO TABS
ORAL_TABLET | ORAL | Status: AC
  Filled 2018-12-24: qty 2

## 2018-12-24 MED ORDER — DIPHENHYDRAMINE HCL 25 MG PO CAPS
50.0000 mg | ORAL_CAPSULE | Freq: Once | ORAL | Status: AC
  Administered 2018-12-24: 50 mg via ORAL

## 2018-12-24 MED ORDER — DIPHENHYDRAMINE HCL 25 MG PO CAPS
ORAL_CAPSULE | ORAL | Status: AC
  Filled 2018-12-24: qty 2

## 2018-12-24 NOTE — Assessment & Plan Note (Signed)
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, grade 3, ER 0%, PR 0%, Ki-67 30%, HER-2 +3+ by IHC; hypoechoic mass 2 o'clock position 1.2 x 1.3 x 1.7 cm biopsy IDC, LV I present, grade 2-3, ER 0%, PR 0%, Ki-67 40%, HER-2 +3+, left axillary lymph node positive (3 nodes noted by [place]) T3 N1 stage IIIA  Treatment plan 1.Neoadjuvant chemotherapy with [place] completed 3/26/2020followed by Herceptin Perjeta maintenance  2.lumpectomywith axillary lymphnode dissection: 07/12/2018 3.Adjuvant radiation therapy --------------------------------------------------------------------------------------------------------------------------------------------------- 07/12/2018:Lumpectomy (Dr. Marlou Starks): complete treatment response, no residual carcinoma present in breast or 11/11 lymph nodes.  Treatment plan: 1.Adjuvant radiation therapy: Slightly delayed because of COVID-19.  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   Left shoulder pain: Getting steroid injection.  Lower extremity edema: On as needed Bumex, much improved I discontinued potassium replacement today. Patient will come back every 3 weeks for Herceptin Perjeta and every 6 weeks for follow-up with me.

## 2018-12-24 NOTE — Patient Instructions (Addendum)
[facility] Discharge Instructions for Patients Receiving Chemotherapy  Today you received the following chemotherapy agents: Trastuzumab (Herceptin), Pertuzumab (Perjeta)  To help prevent nausea and vomiting after your treatment, we encourage you to take your nausea medication as directed by your MD   If you develop nausea and vomiting that is not controlled by your nausea medication, call the clinic.   BELOW ARE SYMPTOMS THAT SHOULD BE REPORTED IMMEDIATELY:  *FEVER GREATER THAN 100.5 F  *CHILLS WITH OR WITHOUT FEVER  NAUSEA AND VOMITING THAT IS NOT CONTROLLED WITH YOUR NAUSEA MEDICATION  *UNUSUAL SHORTNESS OF BREATH  *UNUSUAL BRUISING OR BLEEDING  TENDERNESS IN MOUTH AND THROAT WITH OR WITHOUT PRESENCE OF ULCERS  *URINARY PROBLEMS  *BOWEL PROBLEMS  UNUSUAL RASH Items with * indicate a potential emergency and should be followed up as soon as possible.  Feel free to call the clinic should you have any questions or concerns. The clinic phone number is [phone].  Please show the [facility] at check-in to the Emergency Department and triage nurse. Coronavirus (COVID-19) Are you at risk?  Are you at risk for the Coronavirus (COVID-19)?  To be considered HIGH RISK for Coronavirus (COVID-19), you have to meet the following criteria:  . Traveled to [place], [place], [place], [place] or [place]; or in the [place] to [place], [place], [place], or [place]; and have fever, cough, and shortness of breath within the last 2 weeks of travel OR . Been in close contact with a person diagnosed with COVID-19 within the last 2 weeks and have fever, cough, and shortness of breath . IF YOU DO NOT MEET THESE CRITERIA, YOU ARE CONSIDERED LOW RISK FOR COVID-19.  What to do if you are HIGH RISK for COVID-19?  Marland Kitchen If you are having a medical emergency, call 911. . Seek medical care right away. Before you go to a doctor's office, urgent care or emergency  department, call ahead and tell them about your recent travel, contact with someone diagnosed with COVID-19, and your symptoms. You should receive instructions from your physician's office regarding next steps of care.  . When you arrive at healthcare provider, tell the healthcare staff immediately you have returned from visiting [place], [place], [place], [place] or [place]; or traveled in the [place] to [place], [place], [place], or [place]; in the last two weeks or you have been in close contact with a person diagnosed with COVID-19 in the last 2 weeks.   . Tell the health care staff about your symptoms: fever, cough and shortness of breath. . After you have been seen by a medical provider, you will be either: o Tested for (COVID-19) and discharged home on quarantine except to seek medical care if symptoms worsen, and asked to  - Stay home and avoid contact with others until you get your results (4-5 days)  - Avoid travel on public transportation if possible (such as bus, train, or airplane) or o Sent to the Emergency Department by EMS for evaluation, COVID-19 testing, and possible admission depending on your condition and test results.  What to do if you are LOW RISK for COVID-19?  Reduce your risk of any infection by using the same precautions used for avoiding the common cold or flu:  Marland Kitchen Wash your hands often with soap and warm water for at least 20 seconds.  If soap and water are not readily available, use an alcohol-based hand sanitizer with at least 60% alcohol.  Marland Kitchen  If coughing or sneezing, cover your mouth and nose by coughing or sneezing into the elbow areas of your shirt or coat, into a tissue or into your sleeve (not your hands). . Avoid shaking hands with others and consider head nods or verbal greetings only. . Avoid touching your eyes, nose, or mouth with unwashed hands.  . Avoid close contact with people who are sick. . Avoid places or events with large numbers of people  in one location, like concerts or sporting events. . Carefully consider travel plans you have or are making. . If you are planning any travel outside or inside the [place], visit the CDC's Travelers' Health webpage for the latest health notices. . If you have some symptoms but not all symptoms, continue to monitor at home and seek medical attention if your symptoms worsen. . If you are having a medical emergency, call 911.   [place] / e-Visit: [email or web address]         [facility]: [facility]: [phone]                   [facility]: [phone]

## 2019-01-05 ENCOUNTER — Ambulatory Visit (INDEPENDENT_AMBULATORY_CARE_PROVIDER_SITE_OTHER): Payer: 59 | Admitting: Bariatrics

## 2019-01-05 ENCOUNTER — Encounter (INDEPENDENT_AMBULATORY_CARE_PROVIDER_SITE_OTHER): Payer: Self-pay | Admitting: Bariatrics

## 2019-01-05 ENCOUNTER — Other Ambulatory Visit: Payer: Self-pay

## 2019-01-05 VITALS — BP 127/62 | HR 72 | Temp 99.0°F | Ht 65.0 in | Wt 170.0 lb

## 2019-01-05 DIAGNOSIS — Z6830 Body mass index (BMI) 30.0-30.9, adult: Secondary | ICD-10-CM | POA: Diagnosis not present

## 2019-01-05 DIAGNOSIS — E669 Obesity, unspecified: Secondary | ICD-10-CM | POA: Diagnosis not present

## 2019-01-05 DIAGNOSIS — R7303 Prediabetes: Secondary | ICD-10-CM

## 2019-01-06 ENCOUNTER — Ambulatory Visit (INDEPENDENT_AMBULATORY_CARE_PROVIDER_SITE_OTHER): Payer: 59 | Admitting: Bariatrics

## 2019-01-07 ENCOUNTER — Other Ambulatory Visit: Payer: Self-pay

## 2019-01-07 ENCOUNTER — Ambulatory Visit
Admission: RE | Admit: 2019-01-07 | Discharge: 2019-01-07 | Disposition: A | Discharge status: Home / Self Care | Payer: Managed Care, Other (non HMO) | Source: Ambulatory Visit | Attending: Hematology and Oncology | Admitting: Hematology and Oncology

## 2019-01-07 DIAGNOSIS — Z171 Estrogen receptor negative status [ER-]: Secondary | ICD-10-CM

## 2019-01-07 DIAGNOSIS — C50412 Malignant neoplasm of upper-outer quadrant of left female breast: Secondary | ICD-10-CM

## 2019-01-10 ENCOUNTER — Encounter (INDEPENDENT_AMBULATORY_CARE_PROVIDER_SITE_OTHER): Payer: Self-pay | Admitting: Bariatrics

## 2019-01-10 NOTE — Progress Notes (Signed)
Office: [phone]  /  Fax: [phone]   HPI:   Chief Complaint: OBESITY Christy Serrano is here to discuss her progress with her obesity treatment plan. She is keeping a food journal with 1500 calories and 90 grams of protein and is following her eating plan approximately 100% of the time. She states she is walking 45-60 minutes 5 times per week. Christy Serrano is up 4 lbs, but has done well overall. Her weight loss has been steady and this is the first time that her weight has gone up in a long time. She is up 3 lbs of water. Her weight is 170 lb (77.1 kg) today and has had a weight gain of 4 lbs since her last visit. She has lost 25 lbs since starting treatment with [place].  Pre-Diabetes Christy Serrano has a diagnosis of prediabetes based on her elevated Hgb A1c and was informed this puts her at greater risk of developing diabetes. She is not taking metformin currently and continues to work on diet and exercise to decrease risk of diabetes. She denies nausea or hypoglycemia. She does report an increased appetite.  ASSESSMENT AND PLAN:  Prediabetes  Class 1 obesity with serious comorbidity and body mass index (BMI) of 30.0 to 30.9 in adult, unspecified obesity type  PLAN:  Pre-Diabetes Christy Serrano will continue to work on weight loss, exercise, and decreasing simple carbohydrates in her diet to help decrease the risk of diabetes. We dicussed metformin including benefits and risks. She was informed that eating too many simple carbohydrates or too many calories at one sitting increases the likelihood of GI side effects. Christy Serrano was instructed to decrease carbohydrates, increase protein and healthy fats. She will follow-up with [place] as directed to monitor her progress.  I spent > than 50% of the 15 minute visit on counseling as documented in the note.  Obesity Christy Serrano is currently in the action stage of change. As such, her goal is to continue with weight loss efforts. She has agreed to keep a food journal  with 1500 calories and 90 grams of protein. Christy Serrano will work on meal planning and intentional eating. She will have IC checked at her next visit in 2 weeks. Christy Serrano has been instructed to continue her current exercise regimen for weight loss and overall health benefits. We discussed the following Behavioral Modification Strategies today: increasing lean protein intake, decreasing simple carbohydrates, increasing vegetables, increase H20 intake, decrease eating out, no skipping meals, work on meal planning and easy cooking plans, and keeping healthy foods in the home.  Christy Serrano has agreed to follow-up with our clinic in 2 weeks. She was informed of the importance of frequent follow-up visits to maximize her success with intensive lifestyle modifications for her multiple health conditions.  ALLERGIES: Allergies  Allergen Reactions  . Oxycodone-Acetaminophen Other (See Comments) and Palpitations    tachycardia   . Gabapentin Swelling    Lower extremity edema  . Amoxicillin Rash    DID THE REACTION INVOLVE: Swelling of the face/tongue/throat, SOB, or low BP? No Sudden or severe rash/hives, skin peeling, or the inside of the mouth or nose? No Did it require medical treatment? No When did it last happen?in her 53's If all above answers are "NO", may proceed with cephalosporin use.     MEDICATIONS: Current Outpatient Medications on File Prior to Visit  Medication Sig Dispense Refill  . acetaminophen (TYLENOL) 500 MG tablet Take 1,000 mg by mouth every 6 (six) hours as needed for mild pain.    Marland Kitchen albuterol (PROVENTIL  HFA;VENTOLIN HFA) 108 (90 Base) MCG/ACT inhaler Inhale into the lungs every 6 (six) hours as needed for wheezing or shortness of breath.    . budesonide-formoterol (SYMBICORT) 160-4.5 MCG/ACT inhaler Inhale 2 puffs into the lungs 2 (two) times daily.    . bumetanide (BUMEX) 1 MG tablet Take 1 mg by mouth 2 (two) times daily as needed.    . Calcium-Phosphorus-Vitamin D  (CITRACAL +D3 PO) Take 1,200 mcg by mouth.    . cholecalciferol (VITAMIN D) 1000 units tablet Take 1,000 Units by mouth daily.    Marland Kitchen estradiol (VIVELLE-DOT) 0.05 MG/24HR patch     . gabapentin (NEURONTIN) 100 MG capsule Take 1 capsule (100 mg total) by mouth daily. 90 capsule 3  . HYDROcodone-acetaminophen (NORCO/VICODIN) 5-325 MG tablet Take 1 tablet by mouth every 6 (six) hours as needed for moderate pain or severe pain. 30 tablet 0  . Multiple Vitamin (MULTIVITAMIN) capsule Take 1 capsule by mouth daily.    Marland Kitchen spironolactone (ALDACTONE) 25 MG tablet Take 0.5 tablets (12.5 mg total) by mouth daily. 45 tablet 3   No current facility-administered medications on file prior to visit.     PAST MEDICAL HISTORY: Past Medical History:  Diagnosis Date  . Breast cancer ([place]) 2020   Left Breast Cancer  . Cancer ([facility])    left breast  . Chronic bronchitis ([facility])   . Family history of breast cancer   . Family history of ovarian cancer   . Family history of prostate cancer   . GERD (gastroesophageal reflux disease)    due to chemotherapy  . History of radiation therapy 09/02/18- 10/14/18   Left Breast 25 fx for a total of 50 Gy, Left SCV, PA 25 fractions for a total of 50 Gy, Left Breast boost 5 fractions, for a total of 10 Gy.   . Lactose intolerance   . Personal history of chemotherapy 2020   Left Breast Cancer  . Personal history of radiation therapy 2020   Left Breast Cancer  . PONV (postoperative nausea and vomiting)     PAST SURGICAL HISTORY: Past Surgical History:  Procedure Laterality Date  . BREAST LUMPECTOMY Left 07/12/2018  . BREAST LUMPECTOMY WITH RADIOACTIVE SEED AND AXILLARY LYMPH NODE DISSECTION Left 07/12/2018   Procedure: LEFT BREAST RADIOACTIVE SEED X 3 LUMPECTOMY X 3  AND AXILLARY LYMPH NODE DISSECTION;  Surgeon: Jovita Kussmaul, MD;  Location: [facility];  Service: General;  Laterality: Left;  . PORTACATH PLACEMENT Right 03/03/2018   Procedure: INSERTION  PORT-A-CATH;  Surgeon: Jovita Kussmaul, MD;  Location: [facility];  Service: General;  Laterality: Right;  . ROTATOR CUFF REPAIR Right 2006  . TOTAL ABDOMINAL HYSTERECTOMY  2010    SOCIAL HISTORY: Social History   Tobacco Use  . Smoking status: Never Smoker  . Smokeless tobacco: Never Used  Substance Use Topics  . Alcohol use: No    Comment: quit long ago  . Drug use: No    FAMILY HISTORY: Family History  Problem Relation Age of Onset  . High blood pressure Mother   . Heart attack Mother        CHF  . High blood pressure Father   . Prostate cancer Father        dx mid 37s  . Breast cancer Sister 40       negative [employer] 46 gene panel  . Breast cancer Paternal Aunt        dx mid 93s  . Cancer Paternal  Uncle        jaw cancer d. 57s  . Ovarian cancer Maternal Grandmother        d. 44s  . Cancer Paternal Grandmother        either stomach or ovarian cancer  . Breast cancer Sister 26       "negative 21 gene panel"   ROS: Review of Systems  Gastrointestinal: Negative for nausea.  Endo/Heme/Allergies:       Negative for hypoglycemia.   PHYSICAL EXAM: Blood pressure 127/62, pulse 72, temperature 99 F (37.2 C), temperature source Oral, height 5\' 5"  (1.651 m), weight 170 lb (77.1 kg), SpO2 99 %. Body mass index is 28.29 kg/m. Physical Exam Vitals signs reviewed.  Constitutional:      Appearance: Normal appearance. She is obese.  Cardiovascular:     Rate and Rhythm: Normal rate.     Pulses: Normal pulses.  Pulmonary:     Effort: Pulmonary effort is normal.     Breath sounds: Normal breath sounds.  Musculoskeletal: Normal range of motion.  Skin:    General: Skin is warm and dry.  Neurological:     Mental Status: She is alert and oriented to person, place, and time.  Psychiatric:        Behavior: Behavior normal.   RECENT LABS AND TESTS: BMET    Component Value Date/Time   NA 140 12/24/2018 0912   NA 138 12/22/2017 1235   K 4.2 12/24/2018  0912   CL 105 12/24/2018 0912   CO2 26 12/24/2018 0912   GLUCOSE 87 12/24/2018 0912   BUN 19 12/24/2018 0912   BUN 12 12/22/2017 1235   CREATININE 1.12 (H) 12/24/2018 0912   CALCIUM 9.3 12/24/2018 0912   GFRNONAA 53 (L) 12/24/2018 0912   GFRAA >60 12/24/2018 0912   Lab Results  Component Value Date   HGBA1C 5.7 (H) 10/20/2018   HGBA1C 5.6 05/31/2018   HGBA1C 6.1 (H) 12/22/2017   Lab Results  Component Value Date   INSULIN 4.8 05/31/2018   INSULIN 13.7 12/22/2017   CBC    Component Value Date/Time   WBC 3.6 (L) 12/24/2018 0912   WBC 3.9 (L) 08/20/2018 0845   RBC 3.49 (L) 12/24/2018 0912   HGB 10.4 (L) 12/24/2018 0912   HGB 12.3 12/22/2017 1235   HCT 32.2 (L) 12/24/2018 0912   HCT 38.2 12/22/2017 1235   PLT 161 12/24/2018 0912   MCV 92.3 12/24/2018 0912   MCV 92 12/22/2017 1235   MCH 29.8 12/24/2018 0912   MCHC 32.3 12/24/2018 0912   RDW 15.2 12/24/2018 0912   RDW 14.8 12/22/2017 1235   LYMPHSABS 0.9 12/24/2018 0912   LYMPHSABS 2.0 12/22/2017 1235   MONOABS 0.3 12/24/2018 0912   EOSABS 0.1 12/24/2018 0912   EOSABS 0.0 12/22/2017 1235   BASOSABS 0.0 12/24/2018 0912   BASOSABS 0.0 12/22/2017 1235   Iron/TIBC/Ferritin/ %Sat No results found for: IRON, TIBC, FERRITIN, IRONPCTSAT Lipid Panel     Component Value Date/Time   CHOL 176 12/22/2017 1235   TRIG 111 12/22/2017 1235   HDL 61 12/22/2017 1235   LDLCALC 93 12/22/2017 1235   Hepatic Function Panel     Component Value Date/Time   PROT 7.4 12/24/2018 0912   PROT 7.6 12/22/2017 1235   ALBUMIN 4.1 12/24/2018 0912   ALBUMIN 4.8 12/22/2017 1235   AST 18 12/24/2018 0912   ALT 12 12/24/2018 0912   ALKPHOS 88 12/24/2018 0912   BILITOT 0.5 12/24/2018 0912  Component Value Date/Time   TSH 3.644 03/10/2018 0538   TSH 1.930 12/22/2017 1235   Results for ALBERTA, SCHAFFERT (MRN [id number]) as of 01/10/2019 14:35  Ref. Range 10/20/2018 15:50  Vitamin D, 25-Hydroxy Latest Ref Range: 30.0 - 100.0 ng/mL 46.6    OBESITY BEHAVIORAL INTERVENTION VISIT  Today's visit was #16  Starting weight: 195 lbs Starting date: 12/22/2017 Today's weight: 170 lbs  Today's date: 01/05/2019 Total lbs lost to date: 25    01/05/2019  Height 5\' 5"  (1.651 m)  Weight 170 lb (77.1 kg)  BMI (Calculated) 28.29  BLOOD PRESSURE - SYSTOLIC [id number]  BLOOD PRESSURE - DIASTOLIC 62   Body Fat % 0000000 %  Total Body Water (lbs) 76.4 lbs   ASK: We discussed the diagnosis of obesity with Mackie Pai today and Mitra agreed to give [place] permission to discuss obesity behavioral modification therapy today.  ASSESS: Elizah has the diagnosis of obesity and her BMI today is 28.3. Modupe is in the action stage of change.   ADVISE: Myrene was educated on the multiple health risks of obesity as well as the benefit of weight loss to improve her health. She was advised of the need for long term treatment and the importance of lifestyle modifications to improve her current health and to decrease her risk of future health problems.  AGREE: Multiple dietary modification options and treatment options were discussed and  Christy Serrano agreed to follow the recommendations documented in the above note.  ARRANGE: Christy Serrano was educated on the importance of frequent visits to treat obesity as outlined per CMS and USPSTF guidelines and agreed to schedule her next follow up appointment today.  Christy Serrano, am acting as Location manager for [employer], DO   I have reviewed the above documentation for accuracy and completeness, and I agree with the above. -Jearld Lesch, DO

## 2019-01-14 ENCOUNTER — Inpatient Hospital Stay: Payer: Managed Care, Other (non HMO)

## 2019-01-14 ENCOUNTER — Other Ambulatory Visit: Payer: Self-pay

## 2019-01-14 VITALS — BP 115/63 | HR 70 | Temp 98.4°F | Resp 16 | Wt 174.2 lb

## 2019-01-14 DIAGNOSIS — Z171 Estrogen receptor negative status [ER-]: Secondary | ICD-10-CM

## 2019-01-14 DIAGNOSIS — C50412 Malignant neoplasm of upper-outer quadrant of left female breast: Secondary | ICD-10-CM

## 2019-01-14 DIAGNOSIS — Z95828 Presence of other vascular implants and grafts: Secondary | ICD-10-CM

## 2019-01-14 LAB — CBC WITH DIFFERENTIAL (CANCER CENTER ONLY)
Abs Immature Granulocytes: 0 10*3/uL (ref 0.00–0.07)
Basophils Absolute: 0 10*3/uL (ref 0.0–0.1)
Basophils Relative: 0 %
Eosinophils Absolute: 0.1 10*3/uL (ref 0.0–0.5)
Eosinophils Relative: 2 %
HCT: 32.6 % — ABNORMAL LOW (ref 36.0–46.0)
Hemoglobin: 10.5 g/dL — ABNORMAL LOW (ref 12.0–15.0)
Immature Granulocytes: 0 %
Lymphocytes Relative: 32 %
Lymphs Abs: 1 10*3/uL (ref 0.7–4.0)
MCH: 30.4 pg (ref 26.0–34.0)
MCHC: 32.2 g/dL (ref 30.0–36.0)
MCV: 94.5 fL (ref 80.0–100.0)
Monocytes Absolute: 0.3 10*3/uL (ref 0.1–1.0)
Monocytes Relative: 9 %
Neutro Abs: 1.8 10*3/uL (ref 1.7–7.7)
Neutrophils Relative %: 57 %
Platelet Count: 175 10*3/uL (ref 150–400)
RBC: 3.45 MIL/uL — ABNORMAL LOW (ref 3.87–5.11)
RDW: 14.7 % (ref 11.5–15.5)
WBC Count: 3.1 10*3/uL — ABNORMAL LOW (ref 4.0–10.5)
nRBC: 0 % (ref 0.0–0.2)

## 2019-01-14 LAB — CMP (CANCER CENTER ONLY)
ALT: 13 U/L (ref 0–44)
AST: 18 U/L (ref 15–41)
Albumin: 3.8 g/dL (ref 3.5–5.0)
Alkaline Phosphatase: 89 U/L (ref 38–126)
Anion gap: 10 (ref 5–15)
BUN: 19 mg/dL (ref 8–23)
CO2: 25 mmol/L (ref 22–32)
Calcium: 9.4 mg/dL (ref 8.9–10.3)
Chloride: 105 mmol/L (ref 98–111)
Creatinine: 1.08 mg/dL — ABNORMAL HIGH (ref 0.44–1.00)
GFR, Est AFR Am: >60 mL/min (ref >60)
GFR, Estimated: 55 mL/min — ABNORMAL LOW (ref >60)
Glucose, Bld: 87 mg/dL (ref 70–99)
Potassium: 4 mmol/L (ref 3.5–5.1)
Sodium: 140 mmol/L (ref 135–145)
Total Bilirubin: 0.5 mg/dL (ref 0.3–1.2)
Total Protein: 7.4 g/dL (ref 6.5–8.1)

## 2019-01-14 MED ORDER — ACETAMINOPHEN 325 MG PO TABS
ORAL_TABLET | ORAL | Status: AC
  Filled 2019-01-14: qty 2

## 2019-01-14 MED ORDER — SODIUM CHLORIDE 0.9% FLUSH
10.0000 mL | INTRAVENOUS | Status: DC | PRN
  Administered 2019-01-14: 10 mL
  Filled 2019-01-14: qty 10

## 2019-01-14 MED ORDER — SODIUM CHLORIDE 0.9 % IV SOLN
420.0000 mg | Freq: Once | INTRAVENOUS | Status: AC
  Administered 2019-01-14: 420 mg via INTRAVENOUS
  Filled 2019-01-14: qty 14

## 2019-01-14 MED ORDER — DIPHENHYDRAMINE HCL 25 MG PO CAPS
ORAL_CAPSULE | ORAL | Status: AC
  Filled 2019-01-14: qty 2

## 2019-01-14 MED ORDER — TRASTUZUMAB CHEMO 150 MG IV SOLR
450.0000 mg | Freq: Once | INTRAVENOUS | Status: AC
  Administered 2019-01-14: 450 mg via INTRAVENOUS
  Filled 2019-01-14: qty 21.43

## 2019-01-14 MED ORDER — HEPARIN SOD (PORK) LOCK FLUSH 100 UNIT/ML IV SOLN
500.0000 [IU] | Freq: Once | INTRAVENOUS | Status: AC | PRN
  Administered 2019-01-14: 500 [IU]
  Filled 2019-01-14: qty 5

## 2019-01-14 MED ORDER — ACETAMINOPHEN 325 MG PO TABS
650.0000 mg | ORAL_TABLET | Freq: Once | ORAL | Status: AC
  Administered 2019-01-14: 650 mg via ORAL

## 2019-01-14 MED ORDER — SODIUM CHLORIDE 0.9 % IV SOLN
Freq: Once | INTRAVENOUS | Status: AC
  Administered 2019-01-14: 09:00:00 via INTRAVENOUS
  Filled 2019-01-14: qty 250

## 2019-01-14 MED ORDER — DIPHENHYDRAMINE HCL 25 MG PO CAPS
50.0000 mg | ORAL_CAPSULE | Freq: Once | ORAL | Status: AC
  Administered 2019-01-14: 50 mg via ORAL

## 2019-01-14 NOTE — Patient Instructions (Signed)
[facility] Discharge Instructions for Patients Receiving Chemotherapy  Today you received the following Immunotherapy agents: Trastuzumab, Pertuzumab  To help prevent nausea and vomiting after your treatment, we encourage you to take your nausea medication as directed by your MD.   If you develop nausea and vomiting that is not controlled by your nausea medication, call the clinic.   BELOW ARE SYMPTOMS THAT SHOULD BE REPORTED IMMEDIATELY:  *FEVER GREATER THAN 100.5 F  *CHILLS WITH OR WITHOUT FEVER  NAUSEA AND VOMITING THAT IS NOT CONTROLLED WITH YOUR NAUSEA MEDICATION  *UNUSUAL SHORTNESS OF BREATH  *UNUSUAL BRUISING OR BLEEDING  TENDERNESS IN MOUTH AND THROAT WITH OR WITHOUT PRESENCE OF ULCERS  *URINARY PROBLEMS  *BOWEL PROBLEMS  UNUSUAL RASH Items with * indicate a potential emergency and should be followed up as soon as possible.  Feel free to call the clinic should you have any questions or concerns. The clinic phone number is [phone].  Please show the CHEMO ALERT CARD at check-in to the Emergency Department and triage nurse.  Coronavirus (COVID-19) Are you at risk?  Are you at risk for the Coronavirus (COVID-19)?  To be considered HIGH RISK for Coronavirus (COVID-19), you have to meet the following criteria:  . Traveled to [place], [place], [place], [place] or [place]; or in the [place] to [place], [place], [place], or [place]; and have fever, cough, and shortness of breath within the last 2 weeks of travel OR . Been in close contact with a person diagnosed with COVID-19 within the last 2 weeks and have fever, cough, and shortness of breath . IF YOU DO NOT MEET THESE CRITERIA, YOU ARE CONSIDERED LOW RISK FOR COVID-19.  What to do if you are HIGH RISK for COVID-19?  . If you are having a medical emergency, call 911. . Seek medical care right away. Before you go to a doctor's office, urgent care or emergency department, call ahead and  tell them about your recent travel, contact with someone diagnosed with COVID-19, and your symptoms. You should receive instructions from your physician's office regarding next steps of care.  . When you arrive at healthcare provider, tell the healthcare staff immediately you have returned from visiting [place], [place], [place], [place] or [place]; or traveled in the [place] to [place], [place], [place], or [place]; in the last two weeks or you have been in close contact with a person diagnosed with COVID-19 in the last 2 weeks.   . Tell the health care staff about your symptoms: fever, cough and shortness of breath. . After you have been seen by a medical provider, you will be either: o Tested for (COVID-19) and discharged home on quarantine except to seek medical care if symptoms worsen, and asked to  - Stay home and avoid contact with others until you get your results (4-5 days)  - Avoid travel on public transportation if possible (such as bus, train, or airplane) or o Sent to the Emergency Department by EMS for evaluation, COVID-19 testing, and possible admission depending on your condition and test results.  What to do if you are LOW RISK for COVID-19?  Reduce your risk of any infection by using the same precautions used for avoiding the common cold or flu:  . Wash your hands often with soap and warm water for at least 20 seconds.  If soap and water are not readily available, use an alcohol-based hand sanitizer with at least 60% alcohol.  .   coughing or sneezing, cover your mouth and nose by coughing or sneezing into the elbow areas of your shirt or coat, into a tissue or into your sleeve (not your hands). . Avoid shaking hands with others and consider head nods or verbal greetings only. . Avoid touching your eyes, nose, or mouth with unwashed hands.  . Avoid close contact with people who are sick. . Avoid places or events with large numbers of people in one location, like  concerts or sporting events. . Carefully consider travel plans you have or are making. . If you are planning any travel outside or inside the [place], visit the CDC's Travelers' Health webpage for the latest health notices. . If you have some symptoms but not all symptoms, continue to monitor at home and seek medical attention if your symptoms worsen. . If you are having a medical emergency, call 911.   [place] / e-Visit: [email or web address]         [facility]: [facility]: [phone]                   [facility]: [phone]

## 2019-01-26 ENCOUNTER — Encounter: Payer: Self-pay | Admitting: Bariatrics

## 2019-01-26 ENCOUNTER — Other Ambulatory Visit: Payer: Self-pay

## 2019-01-26 ENCOUNTER — Ambulatory Visit (INDEPENDENT_AMBULATORY_CARE_PROVIDER_SITE_OTHER): Payer: 59 | Admitting: Bariatrics

## 2019-01-26 VITALS — BP 116/64 | HR 64 | Temp 98.2°F | Ht 65.0 in | Wt 169.0 lb

## 2019-01-26 DIAGNOSIS — R0602 Shortness of breath: Secondary | ICD-10-CM | POA: Diagnosis not present

## 2019-01-26 DIAGNOSIS — E669 Obesity, unspecified: Secondary | ICD-10-CM

## 2019-01-26 DIAGNOSIS — R7303 Prediabetes: Secondary | ICD-10-CM

## 2019-01-26 DIAGNOSIS — R5383 Other fatigue: Secondary | ICD-10-CM

## 2019-01-26 DIAGNOSIS — E559 Vitamin D deficiency, unspecified: Secondary | ICD-10-CM

## 2019-01-26 DIAGNOSIS — Z6830 Body mass index (BMI) 30.0-30.9, adult: Secondary | ICD-10-CM

## 2019-01-26 NOTE — Progress Notes (Signed)
Office: [phone]  /  Fax: [phone]   HPI:   Chief Complaint: OBESITY Christy Serrano is here to discuss her progress with her obesity treatment plan. She is on the keep a food journal with 1500 calories and 90 grams of protein daily and is following her eating plan approximately 100 % of the time. She states she is walking for 45-60 minutes 4 times per week. Christy Serrano is down 1 lb and is doing well overall.  Her weight is 169 lb (76.7 kg) today and has had a weight loss of 1 pound over a period of 3 weeks since her last visit. She has lost 26 lbs since starting treatment with [place].  Pre-Diabetes Christy Serrano has a diagnosis of pre-diabetes based on her elevated Hgb A1c and was informed this puts her at greater risk of developing diabetes. Last A1c was 5.7. She is not taking metformin currently and continues to work on diet and exercise to decrease risk of diabetes. She denies polyphagia.  Vitamin D Deficiency Christy Serrano has a diagnosis of vitamin D deficiency. She is currently taking OTC Vit D and denies nausea, vomiting or muscle weakness.  Fatigue Christy Serrano feels her energy is lower than it should be. This has worsened with weight gain and has not worsened recently. Christy Serrano admits to daytime somnolence.  Shortness of Breath with Exertion Christy Serrano notes increasing shortness of breath with exercising and seems to be worsening over time with weight gain. She notes getting out of breath sooner with activity than she used to. This has not gotten worse recently. Last IC done on 12/22/17 was 1812. Christy Serrano denies shortness of breath at rest or orthopnea.  ASSESSMENT AND PLAN:  Prediabetes  Vitamin D deficiency  Other fatigue  SOB (shortness of breath) on exertion  Class 1 obesity with serious comorbidity and body mass index (BMI) of 30.0 to 30.9 in adult, unspecified obesity type - BMI greater than 30 at start of program  PLAN:  Pre-Diabetes Christy Serrano will continue to work on weight loss,  exercise, increase protein, and decreasing simple carbohydrates in her diet to help decrease the risk of diabetes. We dicussed metformin including benefits and risks. She was informed that eating too many simple carbohydrates or too many calories at one sitting increases the likelihood of GI side effects. Christy Serrano agrees to follow up with [place] as directed to monitor her progress.  Vitamin D Deficiency Christy Serrano was informed that low vitamin D levels contributes to fatigue and are associated with obesity, breast, and colon cancer. Christy Serrano agrees to continue taking OTC Vit D and will follow up for routine testing of vitamin D, at least 2-3 times per year. She was informed of the risk of over-replacement of vitamin D and agrees to not increase her dose unless she discusses this with [place] first. Christy Serrano agrees to follow up with our clinic in 2 to 3 weeks.  Fatigue Christy Serrano was informed that her fatigue may be related to obesity, depression or many other causes. Labs will be ordered, and in the meanwhile Christy Serrano has agreed to work on diet, exercise and weight loss to help with fatigue. Proper sleep hygiene was discussed including the need for 7-8 hours of quality sleep each night.  Shortness of Breath with Exertion Christy Serrano's shortness of breath appears to be obesity related and exercise induced. The indirect calorimeter results showed VO2 of 246 and a REE of 1710. She has agreed to work on weight loss and gradually increase exercise to treat her exercise induced shortness of breath. If  Christy Serrano follows our instructions and loses weight without improvement of her shortness of breath, we will plan to refer to pulmonology. Christy Serrano agrees to this plan.  I spent > than 50% of the 15 minute visit on counseling as documented in the note.  Obesity Christy Serrano is currently in the action stage of change. As such, her goal is to continue with weight loss efforts She has agreed to keep a food journal with 1300 calories  and 80 grams of protein daily or follow the Category 2 plan + 100 calories Christy Serrano has been instructed to work up to a goal of 150 minutes of combined cardio and strengthening exercise per week or continue walking and begin arm weights 3 times per week for 10-15 minutes for weight loss and overall health benefits. We discussed the following Behavioral Modification Strategies today: increasing lean protein intake, decreasing simple carbohydrates, increasing vegetables, decrease eating out, increase H20 intake, no skipping meals, work on meal planning and easy cooking plans, keeping healthy foods in the home, and planning for success   Christy Serrano has agreed to follow up with our clinic in 2 to 3 weeks. She was informed of the importance of frequent follow up visits to maximize her success with intensive lifestyle modifications for her multiple health conditions.  ALLERGIES: Allergies  Allergen Reactions  . Oxycodone-Acetaminophen Other (See Comments) and Palpitations    tachycardia   . Gabapentin Swelling    Lower extremity edema  . Amoxicillin Rash    DID THE REACTION INVOLVE: Swelling of the face/tongue/throat, SOB, or low BP? No Sudden or severe rash/hives, skin peeling, or the inside of the mouth or nose? No Did it require medical treatment? No When did it last happen?in her 21's If all above answers are "NO", may proceed with cephalosporin use.     MEDICATIONS: Current Outpatient Medications on File Prior to Visit  Medication Sig Dispense Refill  . acetaminophen (TYLENOL) 500 MG tablet Take 1,000 mg by mouth every 6 (six) hours as needed for mild pain.    Marland Kitchen albuterol (PROVENTIL HFA;VENTOLIN HFA) 108 (90 Base) MCG/ACT inhaler Inhale into the lungs every 6 (six) hours as needed for wheezing or shortness of breath.    . budesonide-formoterol (SYMBICORT) 160-4.5 MCG/ACT inhaler Inhale 2 puffs into the lungs 2 (two) times daily.    . bumetanide (BUMEX) 1 MG tablet Take 1 mg by mouth  2 (two) times daily as needed.    . Calcium-Phosphorus-Vitamin D (CITRACAL +D3 PO) Take 1,200 mcg by mouth.    . cholecalciferol (VITAMIN D) 1000 units tablet Take 1,000 Units by mouth daily.    Marland Kitchen estradiol (VIVELLE-DOT) 0.05 MG/24HR patch     . gabapentin (NEURONTIN) 100 MG capsule Take 1 capsule (100 mg total) by mouth daily. 90 capsule 3  . HYDROcodone-acetaminophen (NORCO/VICODIN) 5-325 MG tablet Take 1 tablet by mouth every 6 (six) hours as needed for moderate pain or severe pain. 30 tablet 0  . Multiple Vitamin (MULTIVITAMIN) capsule Take 1 capsule by mouth daily.    Marland Kitchen spironolactone (ALDACTONE) 25 MG tablet Take 0.5 tablets (12.5 mg total) by mouth daily. 45 tablet 3   No current facility-administered medications on file prior to visit.     PAST MEDICAL HISTORY: Past Medical History:  Diagnosis Date  . Breast cancer ([place]) 2020   Left Breast Cancer  . Cancer ([place])    left breast  . Chronic bronchitis ([place])   . Family history of breast cancer   . Family history of ovarian  cancer   . Family history of prostate cancer   . GERD (gastroesophageal reflux disease)    due to chemotherapy  . History of radiation therapy 09/02/18- 10/14/18   Left Breast 25 fx for a total of 50 Gy, Left SCV, PA 25 fractions for a total of 50 Gy, Left Breast boost 5 fractions, for a total of 10 Gy.   . Lactose intolerance   . Personal history of chemotherapy 2020   Left Breast Cancer  . Personal history of radiation therapy 2020   Left Breast Cancer  . PONV (postoperative nausea and vomiting)     PAST SURGICAL HISTORY: Past Surgical History:  Procedure Laterality Date  . BREAST LUMPECTOMY Left 07/12/2018  . BREAST LUMPECTOMY WITH RADIOACTIVE SEED AND AXILLARY LYMPH NODE DISSECTION Left 07/12/2018   Procedure: LEFT BREAST RADIOACTIVE SEED X 3 LUMPECTOMY X 3  AND AXILLARY LYMPH NODE DISSECTION;  Surgeon: Jovita Kussmaul, MD;  Location: [facility];  Service: General;  Laterality: Left;  .  PORTACATH PLACEMENT Right 03/03/2018   Procedure: INSERTION PORT-A-CATH;  Surgeon: Jovita Kussmaul, MD;  Location: [facility];  Service: General;  Laterality: Right;  . ROTATOR CUFF REPAIR Right 2006  . TOTAL ABDOMINAL HYSTERECTOMY  2010    SOCIAL HISTORY: Social History   Tobacco Use  . Smoking status: Never Smoker  . Smokeless tobacco: Never Used  Substance Use Topics  . Alcohol use: No    Comment: quit long ago  . Drug use: No    FAMILY HISTORY: Family History  Problem Relation Age of Onset  . High blood pressure Mother   . Heart attack Mother        CHF  . High blood pressure Father   . Prostate cancer Father        dx mid 77s  . Breast cancer Sister 71       negative Invitae 46 gene panel  . Breast cancer Paternal Aunt        dx mid 79s  . Cancer Paternal Uncle        jaw cancer d. 47s  . Ovarian cancer Maternal Grandmother        d. 79s  . Cancer Paternal Grandmother        either stomach or ovarian cancer  . Breast cancer Sister 52       "negative 21 gene panel"    ROS: Review of Systems  Constitutional: Positive for malaise/fatigue and weight loss.  Respiratory: Positive for shortness of breath (with exertion).   Cardiovascular: Negative for orthopnea.  Gastrointestinal: Negative for nausea and vomiting.  Musculoskeletal:       Negative muscle weakness  Endo/Heme/Allergies:       Negative polyphagia    PHYSICAL EXAM: Blood pressure 116/64, pulse 64, temperature 98.2 F (36.8 C), height 5\' 5"  (1.651 m), weight 169 lb (76.7 kg), SpO2 98 %. Body mass index is 28.12 kg/m. Physical Exam Vitals signs reviewed.  Constitutional:      Appearance: Normal appearance. She is obese.  Cardiovascular:     Rate and Rhythm: Normal rate.     Pulses: Normal pulses.  Pulmonary:     Effort: Pulmonary effort is normal.     Breath sounds: Normal breath sounds.  Musculoskeletal: Normal range of motion.  Skin:    General: Skin is warm and dry.   Neurological:     Mental Status: She is alert and oriented to person, place, and time.  Psychiatric:  Mood and Affect: Mood normal.        Behavior: Behavior normal.     RECENT LABS AND TESTS: BMET    Component Value Date/Time   NA 140 01/14/2019 0828   NA 138 12/22/2017 1235   K 4.0 01/14/2019 0828   CL 105 01/14/2019 0828   CO2 25 01/14/2019 0828   GLUCOSE 87 01/14/2019 0828   BUN 19 01/14/2019 0828   BUN 12 12/22/2017 1235   CREATININE 1.08 (H) 01/14/2019 0828   CALCIUM 9.4 01/14/2019 0828   GFRNONAA 55 (L) 01/14/2019 0828   GFRAA >60 01/14/2019 0828   Lab Results  Component Value Date   HGBA1C 5.7 (H) 10/20/2018   HGBA1C 5.6 05/31/2018   HGBA1C 6.1 (H) 12/22/2017   Lab Results  Component Value Date   INSULIN 4.8 05/31/2018   INSULIN 13.7 12/22/2017   CBC    Component Value Date/Time   WBC 3.1 (L) 01/14/2019 0828   WBC 3.9 (L) 08/20/2018 0845   RBC 3.45 (L) 01/14/2019 0828   HGB 10.5 (L) 01/14/2019 0828   HGB 12.3 12/22/2017 1235   HCT 32.6 (L) 01/14/2019 0828   HCT 38.2 12/22/2017 1235   PLT 175 01/14/2019 0828   MCV 94.5 01/14/2019 0828   MCV 92 12/22/2017 1235   MCH 30.4 01/14/2019 0828   MCHC 32.2 01/14/2019 0828   RDW 14.7 01/14/2019 0828   RDW 14.8 12/22/2017 1235   LYMPHSABS 1.0 01/14/2019 0828   LYMPHSABS 2.0 12/22/2017 1235   MONOABS 0.3 01/14/2019 0828   EOSABS 0.1 01/14/2019 0828   EOSABS 0.0 12/22/2017 1235   BASOSABS 0.0 01/14/2019 0828   BASOSABS 0.0 12/22/2017 1235   Iron/TIBC/Ferritin/ %Sat No results found for: IRON, TIBC, FERRITIN, IRONPCTSAT Lipid Panel     Component Value Date/Time   CHOL 176 12/22/2017 1235   TRIG 111 12/22/2017 1235   HDL 61 12/22/2017 1235   LDLCALC 93 12/22/2017 1235   Hepatic Function Panel     Component Value Date/Time   PROT 7.4 01/14/2019 0828   PROT 7.6 12/22/2017 1235   ALBUMIN 3.8 01/14/2019 0828   ALBUMIN 4.8 12/22/2017 1235   AST 18 01/14/2019 0828   ALT 13 01/14/2019 0828    ALKPHOS 89 01/14/2019 0828   BILITOT 0.5 01/14/2019 0828      Component Value Date/Time   TSH 3.644 03/10/2018 0538   TSH 1.930 12/22/2017 1235      OBESITY BEHAVIORAL INTERVENTION VISIT  Today's visit was # 17   Starting weight: 195 lbs Starting date: 12/22/17 Today's weight : 169 lbs  Today's date: 01/26/2019 Total lbs lost to date: 76    ASK: We discussed the diagnosis of obesity with Mackie Pai today and Sharyn Lull agreed to give [place] permission to discuss obesity behavioral modification therapy today.  ASSESS: Adamary has the diagnosis of obesity and her BMI today is 28.12 Carigan is in the action stage of change   ADVISE: Jodelle was educated on the multiple health risks of obesity as well as the benefit of weight loss to improve her health. She was advised of the need for long term treatment and the importance of lifestyle modifications to improve her current health and to decrease her risk of future health problems.  AGREE: Multiple dietary modification options and treatment options were discussed and  Danaejah agreed to follow the recommendations documented in the above note.  ARRANGE: Ellorie was educated on the importance of frequent visits to treat obesity as outlined per CMS  and USPSTF guidelines and agreed to schedule her next follow up appointment today.  Wilhemena Durie, am acting as transcriptionist for [employer], DO  I have reviewed the above documentation for accuracy and completeness, and I agree with the above. -Jearld Lesch, DO

## 2019-01-27 ENCOUNTER — Encounter (INDEPENDENT_AMBULATORY_CARE_PROVIDER_SITE_OTHER): Payer: Self-pay | Admitting: Bariatrics

## 2019-02-02 ENCOUNTER — Ambulatory Visit: Payer: Managed Care, Other (non HMO) | Admitting: Orthopedic Surgery

## 2019-02-02 ENCOUNTER — Other Ambulatory Visit: Payer: Self-pay

## 2019-02-02 DIAGNOSIS — M7502 Adhesive capsulitis of left shoulder: Secondary | ICD-10-CM | POA: Diagnosis not present

## 2019-02-02 MED ORDER — METHOCARBAMOL 500 MG PO TABS: ORAL_TABLET | ORAL | 0 refills | Status: DC

## 2019-02-02 MED FILL — METHOCARBAMOL 500 MG TABLET: 500 | 10 days supply | Qty: 30 | Fill #0

## 2019-02-03 ENCOUNTER — Encounter: Payer: Self-pay | Admitting: Orthopedic Surgery

## 2019-02-03 NOTE — Progress Notes (Signed)
Office Visit Note   Patient: Christy Serrano           Date of Birth: [birth year]           MRN: [id number] Visit Date: 02/02/2019 Requested by: Velna Hatchet, MD [address] [place],   [id number] PCP: Velna Hatchet, MD  Subjective: Chief Complaint  Patient presents with  . Left Shoulder - Follow-up    HPI: Yazlynn is a patient here for left shoulder follow-up.  She had a subacromial injection in August and did get good relief from that until last week when it started aching again.  She has random achiness in that left shoulder caused by activity but also comes at rest.  Does have a history of cancer.  She had an MRI scan in August 2020 which showed high-grade distal supraspinatus tear.  Describes achiness in the lower deltoid region for about a week.  Denies any interval history of trauma or injury to the shoulder.              ROS: All systems reviewed are negative as they relate to the chief complaint within the history of present illness.  Patient denies  fevers or chills.   Assessment & Plan: Visit Diagnoses:  1. Adhesive capsulitis of left shoulder     Plan: Impression is new onset left shoulder adhesive capsulitis.  Patient has diminished passive forward flexion abduction and external rotation today on the left side compared to the right.  This is a new finding which is developed over the last several months.  I think her best course of action would be intra-articular cortisone injection but she wants to hold off on that for now.  We will try her on some muscle relaxers but she will need to do home exercises of stretching at least an hour a day in order to really try to stretch this shoulder back out.  We will put her in physical therapy near [place] as well.  She currently is on hydrocodone.  Come back in 6 weeks for clinical recheck and potential intra-articular injection at that time.  I did tell her that it will be a painful endeavor for her to get the shoulder  range of motion back but we are very early in its course so it should be possible.  Follow-Up Instructions: Return in about 6 weeks (around 03/16/2019).   Orders:  Orders Placed This Encounter  Procedures  . Ambulatory referral to Physical Therapy   Meds ordered this encounter  Medications  . methocarbamol (ROBAXIN) 500 MG tablet    Sig: 1 po q 8 hrs prn    Dispense:  30 tablet    Refill:  0      Procedures: No procedures performed   Clinical Data: No additional findings.  Objective: Vital Signs: There were no vitals taken for this visit.  Physical Exam:   Constitutional: Patient appears well-developed HEENT:  Head: Normocephalic Eyes:EOM are normal Neck: Normal range of motion Cardiovascular: Normal rate Pulmonary/chest: Effort normal Neurologic: Patient is alert Skin: Skin is warm Psychiatric: Patient has normal mood and affect    Ortho Exam: Ortho exam demonstrates good cervical spine range of motion.  She has 5 out of 5 grip EPL FPL interosseous wrist flexion extension bicep triceps and deltoid strength.  Pretty reasonable infraspinatus supraspinatus and subscap strength testing bilaterally.  Notably today she has only about 110 of active forward flexion and about 75 degrees of active abduction on the left compared to the  right.  Also has about 65 degrees of external rotation passively on the right compared to about 45 on the left.  No masses lymphadenopathy or skin changes noted in that left shoulder girdle region  Specialty Comments:  No specialty comments available.  Imaging: No results found.   PMFS History: Patient Active Problem List   Diagnosis Date Noted  . Carcinoma of upper-outer quadrant of left breast in female, estrogen receptor negative ([place]) 07/12/2018  . Seizure-like activity ([place])   . Hyponatremia   . Recurrent syncope 03/08/2018  . LFT elevation   . Seizure ([place])   . Thrombocytopenia ([place])   . Port-A-Cath in place 03/04/2018  .  Genetic testing 02/23/2018  . Family history of breast cancer   . Family history of prostate cancer   . Family history of ovarian cancer   . Malignant neoplasm of left female breast ([place]) 02/03/2018  . Prediabetes 01/06/2018  . Reactive airway disease without complication [id number]  . Class 1 obesity with serious comorbidity and body mass index (BMI) of 32.0 to 32.9 in adult 12/22/2017   Past Medical History:  Diagnosis Date  . Breast cancer ([place]) 2020   Left Breast Cancer  . Cancer ([place])    left breast  . Chronic bronchitis ([place])   . Family history of breast cancer   . Family history of ovarian cancer   . Family history of prostate cancer   . GERD (gastroesophageal reflux disease)    due to chemotherapy  . History of radiation therapy 09/02/18- 10/14/18   Left Breast 25 fx for a total of 50 Gy, Left SCV, PA 25 fractions for a total of 50 Gy, Left Breast boost 5 fractions, for a total of 10 Gy.   . Lactose intolerance   . Personal history of chemotherapy 2020   Left Breast Cancer  . Personal history of radiation therapy 2020   Left Breast Cancer  . PONV (postoperative nausea and vomiting)     Family History  Problem Relation Age of Onset  . High blood pressure Mother   . Heart attack Mother        CHF  . High blood pressure Father   . Prostate cancer Father        dx mid 75s  . Breast cancer Sister 36       negative Invitae 46 gene panel  . Breast cancer Paternal Aunt        dx mid 48s  . Cancer Paternal Uncle        jaw cancer d. 8s  . Ovarian cancer Maternal Grandmother        d. 1s  . Cancer Paternal Grandmother        either stomach or ovarian cancer  . Breast cancer Sister 74       "negative 21 gene panel"    Past Surgical History:  Procedure Laterality Date  . BREAST LUMPECTOMY Left 07/12/2018  . BREAST LUMPECTOMY WITH RADIOACTIVE SEED AND AXILLARY LYMPH NODE DISSECTION Left 07/12/2018   Procedure: LEFT BREAST RADIOACTIVE SEED X 3 LUMPECTOMY X 3  AND  AXILLARY LYMPH NODE DISSECTION;  Surgeon: Jovita Kussmaul, MD;  Location: [facility];  Service: General;  Laterality: Left;  . PORTACATH PLACEMENT Right 03/03/2018   Procedure: INSERTION PORT-A-CATH;  Surgeon: Jovita Kussmaul, MD;  Location: [employer] [facility];  Service: General;  Laterality: Right;  . ROTATOR CUFF REPAIR Right 2006  . TOTAL ABDOMINAL HYSTERECTOMY  2010   Social History  Occupational History  . Occupation: Quarry manager    Comment: [employer]  Tobacco Use  . Smoking status: Never Smoker  . Smokeless tobacco: Never Used  Substance and Sexual Activity  . Alcohol use: No    Comment: quit long ago  . Drug use: No  . Sexual activity: Not on file

## 2019-02-03 NOTE — Progress Notes (Signed)
Patient Care Team: Velna Hatchet, MD as PCP - General (Internal Medicine) Jovita Kussmaul, MD as Consulting Physician (General Surgery) Nicholas Lose, MD as Consulting Physician (Hematology and Oncology) Eppie Gibson, MD as Attending Physician (Radiation Oncology) Bensimhon, Shaune Pascal, MD as Consulting Physician (Cardiology)  DIAGNOSIS:    ICD-10-CM   1. Carcinoma of upper-outer quadrant of left breast in female, estrogen receptor negative ([place])  C50.412    Z17.1     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 breast ([place])  01/18/2018 Initial Diagnosis   Screening mammogram detected microcalcifications and asymmetry, left breast segmental microcalcifications UOQ 1.5 x 4.3 x 5.4 cm biopsy-proven IDC with DCIS with LV I, grade 3, ER 0%, PR 0%, Ki-67 30%, HER-2 +3+ by IHC; hypoechoic mass 2 o'clock position 1.2 x 1.3 x 1.7 cm biopsy IDC, LV I present, grade 2-3, ER 0%, PR 0%, Ki-67 40%, HER-2 +3+, left axillary lymph node positive (3 nodes noted by [place]) T3 N1 stage IIIA   02/03/2018 Cancer Staging   Staging form: Breast, AJCC 8th Edition - Clinical: Stage IIIA (cT3, cN1, cM0, G3, ER-, PR-, HER2+) - Signed by Nicholas Lose, MD on 02/03/2018   02/20/2018 Genetic Testing   No pathogenic variants identified on the [facility] Expanded + RNA insight panel. Genes Analyzed (67 total): AIP, ALK, APC*, ATM*, BAP1, BARD1, BLM, BMPR1A, BRCA1*, BRCA2*, BRIP1*, CDH1*, CDK4, CDKN1B, CDKN2A, CHEK2*, DICER1, FANCC, FH, FLCN, GALNT12, HOXB13, MAX, MEN1, MET, MLH1*, MRE11A, MSH2*, MSH6*, MUTYH*, NBN, NF1*, NF2, PALB2*, PHOX2B, PMS2*, POLD1, POLE, POT1, PRKAR1A, PTCH1, PTEN*, RAD50, RAD51C*, RAD51D*, RB1, RET, SDHA, SDHAF2, SDHB, SDHC, SDHD, SMAD4, SMARCA4, SMARCB1, SMARCE1, STK11, SUFU, TMEM127, TP53*, TSC1, TSC2, VHL and XRCC2 (sequencing and deletion/duplication); MITF (sequencing only); EPCAM and GREM1 (deletion/duplication only). DNA and RNA analyses performed  for * genes. The report date is 02/20/2018.   03/04/2018 -  Neo-Adjuvant Chemotherapy   Neoadjuvant chemotherapy with [facility]   03/08/2018 - 03/10/2018 Hospital Admission   Admitted for recurrent syncope with loss of consciousness for up to 5 minutes with loss of bladder, no abnormalities identified.  MRI brain, carotid ultrasound, EEG studies were negative.   06/17/2018 Breast MRI   No suspicious residual enhancement identified in the left breast, compatible with treatment response, interval decrease in size of left axillary lymph nodes   07/12/2018 Surgery   Lumpectomy (Dr. Marlou Starks): complete treatment response, no residual carcinoma present in breast or 11/11 lymph nodes.    09/02/2018 - 10/14/2018 Radiation Therapy   Adjuvant XRT   Carcinoma of upper-outer quadrant of left breast in female, estrogen receptor negative ([place])  07/12/2018 Initial Diagnosis   Carcinoma of upper-outer quadrant of left breast in female, estrogen receptor negative ([place])   08/11/2018 Cancer Staging   Staging form: Breast, AJCC 8th Edition - Pathologic: No Stage Recommended (ypT0, pN0) - Signed by Eppie Gibson, MD on 08/11/2018   08/11/2018 Cancer Staging   Staging form: Breast, AJCC 8th Edition - Clinical: Stage IIIA (cT3, cN1, cM0, G3, ER-, PR-, HER2+) - Signed by Eppie Gibson, MD on 08/11/2018   08/20/2018 -  Chemotherapy   The patient had trastuzumab (HERCEPTIN) 450 mg in sodium chloride 0.9 % 250 mL chemo infusion, 462 mg (100 % of original dose 6 mg/kg), Intravenous,  Once, 7 of 9 cycles Dose modification: 6 mg/kg (original dose 6 mg/kg, Cycle 1, Reason: Provider Judgment), 450 mg (original dose 450 mg, Cycle 6, Reason: Other (see comments), Comment: pt assist for Herceptin)  Administration: 450 mg (08/20/2018), 450 mg (09/10/2018), 450 mg (10/01/2018), 450 mg (10/22/2018), 450 mg (12/03/2018), 450 mg (12/24/2018), 450 mg (01/14/2019) pertuzumab (PERJETA) 420 mg in sodium chloride 0.9 % 250 mL chemo infusion, 420  mg (100 % of original dose 420 mg), Intravenous, Once, 8 of 10 cycles Dose modification: 420 mg (original dose 420 mg, Cycle 1, Reason: Provider Judgment) Administration: 420 mg (08/20/2018), 420 mg (09/10/2018), 420 mg (11/12/2018), 420 mg (10/01/2018), 420 mg (10/22/2018), 420 mg (12/03/2018), 420 mg (12/24/2018), 420 mg (01/14/2019) trastuzumab-dkst (OGIVRI) 450 mg in sodium chloride 0.9 % 250 mL chemo infusion, 450 mg (100 % of original dose 450 mg), Intravenous,  Once, 2 of 2 cycles Dose modification: 450 mg (original dose 450 mg, Cycle 5, Reason: Other (see comments), Comment: Biosimilar Conversion), 6 mg/kg (original dose 6 mg/kg, Cycle 7, Reason: Other (see comments), Comment: to biosimalar) Administration: 450 mg (11/12/2018)  for chemotherapy treatment.      CHIEF COMPLIANT: Follow-up on Herceptin Perjeta maintenance  INTERVAL HISTORY: Christy Serrano is a 63 y.o. with above-mentioned history of left breast cancer treated with neoadjuvant chemotherapy,lumpectomy, radiation, andwho is currently undergoing Herceptin Perjeta maintenance. Mammogram on 01/07/19 showed no evidence of malignancy bilaterally. She presents to the clinic todayfor treatment.  REVIEW OF SYSTEMS:   Constitutional: Denies fevers, chills or abnormal weight loss Eyes: Denies blurriness of vision Ears, nose, mouth, throat, and face: Denies mucositis or sore throat Respiratory: Denies cough, dyspnea or wheezes Cardiovascular: Denies palpitation, chest discomfort Gastrointestinal: Denies nausea, heartburn or change in bowel habits Skin: Denies abnormal skin rashes Lymphatics: Denies new lymphadenopathy or easy bruising Neurological: Denies numbness, tingling or new weaknesses Behavioral/Psych: Mood is stable, no new changes  Extremities: No lower extremity edema Breast: denies any pain or lumps or nodules in either breasts All other systems were reviewed with the patient and are negative.  I have reviewed the past  medical history, past surgical history, social history and family history with the patient and they are unchanged from previous note.  ALLERGIES:  is allergic to oxycodone-acetaminophen; gabapentin; and amoxicillin.  MEDICATIONS:  Current Outpatient Medications  Medication Sig Dispense Refill   acetaminophen (TYLENOL) 500 MG tablet Take 1,000 mg by mouth every 6 (six) hours as needed for mild pain.     albuterol (PROVENTIL HFA;VENTOLIN HFA) 108 (90 Base) MCG/ACT inhaler Inhale into the lungs every 6 (six) hours as needed for wheezing or shortness of breath.     budesonide-formoterol (SYMBICORT) 160-4.5 MCG/ACT inhaler Inhale 2 puffs into the lungs 2 (two) times daily.     bumetanide (BUMEX) 1 MG tablet Take 1 mg by mouth 2 (two) times daily as needed.     Calcium-Phosphorus-Vitamin D (CITRACAL +D3 PO) Take 1,200 mcg by mouth.     cholecalciferol (VITAMIN D) 1000 units tablet Take 1,000 Units by mouth daily.     estradiol (VIVELLE-DOT) 0.05 MG/24HR patch      gabapentin (NEURONTIN) 100 MG capsule Take 1 capsule (100 mg total) by mouth daily. 90 capsule 3   HYDROcodone-acetaminophen (NORCO/VICODIN) 5-325 MG tablet Take 1 tablet by mouth every 6 (six) hours as needed for moderate pain or severe pain. 30 tablet 0   methocarbamol (ROBAXIN) 500 MG tablet 1 po q 8 hrs prn 30 tablet 0   Multiple Vitamin (MULTIVITAMIN) capsule Take 1 capsule by mouth daily.     spironolactone (ALDACTONE) 25 MG tablet Take 0.5 tablets (12.5 mg total) by mouth daily. 45 tablet 3   No current facility-administered medications for  this visit.     PHYSICAL EXAMINATION: ECOG PERFORMANCE STATUS: 1 - Symptomatic but completely ambulatory  Vitals:   02/04/19 0945  BP: (!) 121/53  Pulse: 66  Resp: 17  Temp: 98.2 F (36.8 C)  SpO2: 100%   Filed Weights   02/04/19 0945  Weight: 174 lb 8 oz (79.2 kg)    GENERAL: alert, no distress and comfortable SKIN: skin color, texture, turgor are normal, no rashes  or significant lesions EYES: normal, Conjunctiva are pink and non-injected, sclera clear OROPHARYNX: no exudate, no erythema and lips, buccal mucosa, and tongue normal  NECK: supple, thyroid normal size, non-tender, without nodularity LYMPH: no palpable lymphadenopathy in the cervical, axillary or inguinal LUNGS: clear to auscultation and percussion with normal breathing effort HEART: regular rate & rhythm and no murmurs and no lower extremity edema ABDOMEN: abdomen soft, non-tender and normal bowel sounds MUSCULOSKELETAL: no cyanosis of digits and no clubbing  NEURO: alert & oriented x 3 with fluent speech, no focal motor/sensory deficits EXTREMITIES: No lower extremity edema  LABORATORY DATA:  I have reviewed the data as listed CMP Latest Ref Rng & Units 01/14/2019 12/24/2018 12/03/2018  Glucose 70 - 99 mg/dL 87 87 83  BUN 8 - 23 mg/dL 19 19 15   Creatinine 0.44 - 1.00 mg/dL 1.08(H) 1.12(H) 1.07(H)  Sodium 135 - 145 mmol/L 140 140 139  Potassium 3.5 - 5.1 mmol/L 4.0 4.2 3.8  Chloride 98 - 111 mmol/L 105 105 105  CO2 22 - 32 mmol/L 25 26 27   Calcium 8.9 - 10.3 mg/dL 9.4 9.3 9.0  Total Protein 6.5 - 8.1 g/dL 7.4 7.4 7.5  Total Bilirubin 0.3 - 1.2 mg/dL 0.5 0.5 0.6  Alkaline Phos 38 - 126 U/L 89 88 83  AST 15 - 41 U/L 18 18 18   ALT 0 - 44 U/L 13 12 15     Lab Results  Component Value Date   WBC 3.4 (L) 02/04/2019   HGB 10.5 (L) 02/04/2019   HCT 33.3 (L) 02/04/2019   MCV 95.4 02/04/2019   PLT 180 02/04/2019   NEUTROABS 2.2 02/04/2019    ASSESSMENT & PLAN:  Carcinoma of upper-outer quadrant of left breast in female, estrogen receptor negative ([place]) 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, grade 3, ER 0%, PR 0%, Ki-67 30%, HER-2 +3+ by IHC; hypoechoic mass 2 o'clock position 1.2 x 1.3 x 1.7 cm biopsy IDC, LV I present, grade 2-3, ER 0%, PR 0%,  Ki-67 40%, HER-2 +3+, left axillary lymph node positive (3 nodes noted by [place]) T3 N1 stage IIIA  Treatment plan 1.Neoadjuvant chemotherapy with [place] completed 3/26/2020followed by Herceptin Perjeta maintenance  2.lumpectomywith axillary lymphnode dissection: 07/12/2018 3.Adjuvant radiation therapy6/01/2019-10/14/2018 --------------------------------------------------------------------------------------------------------------------------------------------------- 07/12/2018:Lumpectomy (Dr. Marlou Starks): complete treatment response, no residual carcinoma present in breast or 11/11 lymph nodes.  Treatment plan:Herceptin Perjeta maintenanceto be completed December 2020 I briefly discussed the role of neratinib after completion of Herceptin and Perjeta.   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 Left shoulder pain: Getting steroid injection.  Lower extremity edema: On spironolactone, I discontinued Bumex. Breast cancer surveillance: Mammogram 01/07/2019: Benign breast density category C postoperative changes.  She lost her job at [employer] and is looking for another job.  Patient will come back in 3 months to discuss neratinib.     No orders of the defined types were placed in this encounter.  The patient has a good understanding of the overall plan. she agrees with it. she will call with any problems that may develop before the next visit here.  Nicholas Lose, MD 02/04/2019  Julious Oka Dorshimer am acting as scribe for Dr. Nicholas Lose.  I have reviewed the above documentation for accuracy and completeness, and I agree with the above.

## 2019-02-04 ENCOUNTER — Inpatient Hospital Stay: Payer: Managed Care, Other (non HMO)

## 2019-02-04 ENCOUNTER — Inpatient Hospital Stay: Payer: Managed Care, Other (non HMO) | Attending: Hematology and Oncology

## 2019-02-04 ENCOUNTER — Inpatient Hospital Stay (HOSPITAL_BASED_OUTPATIENT_CLINIC_OR_DEPARTMENT_OTHER): Payer: Managed Care, Other (non HMO) | Admitting: Hematology and Oncology

## 2019-02-04 ENCOUNTER — Other Ambulatory Visit: Payer: Self-pay

## 2019-02-04 DIAGNOSIS — Z9221 Personal history of antineoplastic chemotherapy: Secondary | ICD-10-CM | POA: Diagnosis not present

## 2019-02-04 DIAGNOSIS — C50412 Malignant neoplasm of upper-outer quadrant of left female breast: Secondary | ICD-10-CM

## 2019-02-04 DIAGNOSIS — Z171 Estrogen receptor negative status [ER-]: Secondary | ICD-10-CM | POA: Insufficient documentation

## 2019-02-04 DIAGNOSIS — Z79899 Other long term (current) drug therapy: Secondary | ICD-10-CM | POA: Insufficient documentation

## 2019-02-04 DIAGNOSIS — Z95828 Presence of other vascular implants and grafts: Secondary | ICD-10-CM

## 2019-02-04 DIAGNOSIS — Z5112 Encounter for antineoplastic immunotherapy: Secondary | ICD-10-CM | POA: Insufficient documentation

## 2019-02-04 DIAGNOSIS — Z9223 Personal history of estrogen therapy: Secondary | ICD-10-CM | POA: Diagnosis not present

## 2019-02-04 LAB — CMP (CANCER CENTER ONLY)
ALT: 13 U/L (ref 0–44)
AST: 17 U/L (ref 15–41)
Albumin: 4 g/dL (ref 3.5–5.0)
Alkaline Phosphatase: 89 U/L (ref 38–126)
Anion gap: 8 (ref 5–15)
BUN: 17 mg/dL (ref 8–23)
CO2: 27 mmol/L (ref 22–32)
Calcium: 9.5 mg/dL (ref 8.9–10.3)
Chloride: 104 mmol/L (ref 98–111)
Creatinine: 1.05 mg/dL — ABNORMAL HIGH (ref 0.44–1.00)
GFR, Est AFR Am: >60 mL/min (ref >60)
GFR, Estimated: 57 mL/min — ABNORMAL LOW (ref >60)
Glucose, Bld: 85 mg/dL (ref 70–99)
Potassium: 4.3 mmol/L (ref 3.5–5.1)
Sodium: 139 mmol/L (ref 135–145)
Total Bilirubin: 0.5 mg/dL (ref 0.3–1.2)
Total Protein: 7.6 g/dL (ref 6.5–8.1)

## 2019-02-04 LAB — CBC WITH DIFFERENTIAL (CANCER CENTER ONLY)
Abs Immature Granulocytes: 0.01 10*3/uL (ref 0.00–0.07)
Basophils Absolute: 0 10*3/uL (ref 0.0–0.1)
Basophils Relative: 0 %
Eosinophils Absolute: 0 10*3/uL (ref 0.0–0.5)
Eosinophils Relative: 1 %
HCT: 33.3 % — ABNORMAL LOW (ref 36.0–46.0)
Hemoglobin: 10.5 g/dL — ABNORMAL LOW (ref 12.0–15.0)
Immature Granulocytes: 0 %
Lymphocytes Relative: 24 %
Lymphs Abs: 0.8 10*3/uL (ref 0.7–4.0)
MCH: 30.1 pg (ref 26.0–34.0)
MCHC: 31.5 g/dL (ref 30.0–36.0)
MCV: 95.4 fL (ref 80.0–100.0)
Monocytes Absolute: 0.3 10*3/uL (ref 0.1–1.0)
Monocytes Relative: 9 %
Neutro Abs: 2.2 10*3/uL (ref 1.7–7.7)
Neutrophils Relative %: 66 %
Platelet Count: 180 10*3/uL (ref 150–400)
RBC: 3.49 MIL/uL — ABNORMAL LOW (ref 3.87–5.11)
RDW: 14.6 % (ref 11.5–15.5)
WBC Count: 3.4 10*3/uL — ABNORMAL LOW (ref 4.0–10.5)
nRBC: 0 % (ref 0.0–0.2)

## 2019-02-04 MED ORDER — DIPHENHYDRAMINE HCL 25 MG PO CAPS
50.0000 mg | ORAL_CAPSULE | Freq: Once | ORAL | Status: AC
  Administered 2019-02-04: 11:00:00 50 mg via ORAL

## 2019-02-04 MED ORDER — ACETAMINOPHEN 325 MG PO TABS
ORAL_TABLET | ORAL | Status: AC
  Filled 2019-02-04: qty 2

## 2019-02-04 MED ORDER — ACETAMINOPHEN 325 MG PO TABS
650.0000 mg | ORAL_TABLET | Freq: Once | ORAL | Status: AC
  Administered 2019-02-04: 11:00:00 650 mg via ORAL

## 2019-02-04 MED ORDER — SODIUM CHLORIDE 0.9 % IV SOLN
420.0000 mg | Freq: Once | INTRAVENOUS | Status: AC
  Administered 2019-02-04: 12:00:00 420 mg via INTRAVENOUS
  Filled 2019-02-04: qty 14

## 2019-02-04 MED ORDER — GABAPENTIN 100 MG PO CAPS: 200.0000 mg | ORAL_CAPSULE | Freq: Every day | ORAL | 3 refills | Status: DC

## 2019-02-04 MED ORDER — SODIUM CHLORIDE 0.9% FLUSH
10.0000 mL | INTRAVENOUS | Status: DC | PRN
  Administered 2019-02-04: 10 mL
  Filled 2019-02-04: qty 10

## 2019-02-04 MED ORDER — DIPHENHYDRAMINE HCL 25 MG PO CAPS
ORAL_CAPSULE | ORAL | Status: AC
  Filled 2019-02-04: qty 2

## 2019-02-04 MED ORDER — SODIUM CHLORIDE 0.9 % IV SOLN
Freq: Once | INTRAVENOUS | Status: AC
  Administered 2019-02-04: 10:00:00 via INTRAVENOUS
  Filled 2019-02-04: qty 250

## 2019-02-04 MED ORDER — SODIUM CHLORIDE 0.9% FLUSH
10.0000 mL | INTRAVENOUS | Status: DC | PRN
  Administered 2019-02-04: 13:00:00 10 mL
  Filled 2019-02-04: qty 10

## 2019-02-04 MED ORDER — TRASTUZUMAB CHEMO 150 MG IV SOLR
450.0000 mg | Freq: Once | INTRAVENOUS | Status: AC
  Administered 2019-02-04: 11:00:00 450 mg via INTRAVENOUS
  Filled 2019-02-04: qty 21.43

## 2019-02-04 MED ORDER — HEPARIN SOD (PORK) LOCK FLUSH 100 UNIT/ML IV SOLN
500.0000 [IU] | Freq: Once | INTRAVENOUS | Status: AC | PRN
  Administered 2019-02-04: 13:00:00 500 [IU]
  Filled 2019-02-04: qty 5

## 2019-02-04 NOTE — Patient Instructions (Signed)
Trastuzumab injection for infusion What is this medicine? TRASTUZUMAB (tras TOO zoo mab) is a monoclonal antibody. It is used to treat breast cancer and stomach cancer. This medicine may be used for other purposes; ask your health care provider or pharmacist if you have questions. COMMON BRAND NAME(S): Herceptin, Herzuma, KANJINTI, Ogivri, Ontruzant, Trazimera What should I tell my health care provider before I take this medicine? They need to know if you have any of these conditions:  heart disease  heart failure  lung or breathing disease, like asthma  an unusual or allergic reaction to trastuzumab, benzyl alcohol, or other medications, foods, dyes, or preservatives  pregnant or trying to get pregnant  breast-feeding How should I use this medicine? This drug is given as an infusion into a vein. It is administered in a hospital or clinic by a specially trained health care professional. Talk to your pediatrician regarding the use of this medicine in children. This medicine is not approved for use in children. Overdosage: If you think you have taken too much of this medicine contact a poison control center or emergency room at once. NOTE: This medicine is only for you. Do not share this medicine with others. What if I miss a dose? It is important not to miss a dose. Call your doctor or health care professional if you are unable to keep an appointment. What may interact with this medicine? This medicine may interact with the following medications:  certain types of chemotherapy, such as daunorubicin, doxorubicin, epirubicin, and idarubicin This list may not describe all possible interactions. Give your health care provider a list of all the medicines, herbs, non-prescription drugs, or dietary supplements you use. Also tell them if you smoke, drink alcohol, or use illegal drugs. Some items may interact with your medicine. What should I watch for while using this medicine? Visit your  doctor for checks on your progress. Report any side effects. Continue your course of treatment even though you feel ill unless your doctor tells you to stop. Call your doctor or health care professional for advice if you get a fever, chills or sore throat, or other symptoms of a cold or flu. Do not treat yourself. Try to avoid being around people who are sick. You may experience fever, chills and shaking during your first infusion. These effects are usually mild and can be treated with other medicines. Report any side effects during the infusion to your health care professional. Fever and chills usually do not happen with later infusions. Do not become pregnant while taking this medicine or for 7 months after stopping it. Women should inform their doctor if they wish to become pregnant or think they might be pregnant. Women of child-bearing potential will need to have a negative pregnancy test before starting this medicine. There is a potential for serious side effects to an unborn child. Talk to your health care professional or pharmacist for more information. Do not breast-feed an infant while taking this medicine or for 7 months after stopping it. Women must use effective birth control with this medicine. What side effects may I notice from receiving this medicine? Side effects that you should report to your doctor or health care professional as soon as possible:  allergic reactions like skin rash, itching or hives, swelling of the face, lips, or tongue  chest pain or palpitations  cough  dizziness  feeling faint or lightheaded, falls  fever  general ill feeling or flu-like symptoms  signs of worsening heart failure like   breathing problems; swelling in your legs and feet  unusually weak or tired Side effects that usually do not require medical attention (report to your doctor or health care professional if they continue or are bothersome):  bone pain  changes in  taste  diarrhea  joint pain  nausea/vomiting  weight loss This list may not describe all possible side effects. Call your doctor for medical advice about side effects. You may report side effects to FDA at 1-800-FDA-1088. Where should I keep my medicine? This drug is given in a hospital or clinic and will not be stored at home. NOTE: This sheet is a summary. It may not cover all possible information. If you have questions about this medicine, talk to your doctor, pharmacist, or health care provider.  2020 Elsevier/Gold Standard (2016-03-04 14:37:52)  Pertuzumab injection What is this medicine? PERTUZUMAB (per TOOZ ue mab) is a monoclonal antibody. It is used to treat breast cancer. This medicine may be used for other purposes; ask your health care provider or pharmacist if you have questions. COMMON BRAND NAME(S): PERJETA What should I tell my health care provider before I take this medicine? They need to know if you have any of these conditions:  heart disease  heart failure  high blood pressure  history of irregular heart beat  recent or ongoing radiation therapy  an unusual or allergic reaction to pertuzumab, other medicines, foods, dyes, or preservatives  pregnant or trying to get pregnant  breast-feeding How should I use this medicine? This medicine is for infusion into a vein. It is given by a health care professional in a hospital or clinic setting. Talk to your pediatrician regarding the use of this medicine in children. Special care may be needed. Overdosage: If you think you have taken too much of this medicine contact a poison control center or emergency room at once. NOTE: This medicine is only for you. Do not share this medicine with others. What if I miss a dose? It is important not to miss your dose. Call your doctor or health care professional if you are unable to keep an appointment. What may interact with this medicine? Interactions are not  expected. Give your health care provider a list of all the medicines, herbs, non-prescription drugs, or dietary supplements you use. Also tell them if you smoke, drink alcohol, or use illegal drugs. Some items may interact with your medicine. This list may not describe all possible interactions. Give your health care provider a list of all the medicines, herbs, non-prescription drugs, or dietary supplements you use. Also tell them if you smoke, drink alcohol, or use illegal drugs. Some items may interact with your medicine. What should I watch for while using this medicine? Your condition will be monitored carefully while you are receiving this medicine. Report any side effects. Continue your course of treatment even though you feel ill unless your doctor tells you to stop. Do not become pregnant while taking this medicine or for 7 months after stopping it. Women should inform their doctor if they wish to become pregnant or think they might be pregnant. Women of child-bearing potential will need to have a negative pregnancy test before starting this medicine. There is a potential for serious side effects to an unborn child. Talk to your health care professional or pharmacist for more information. Do not breast-feed an infant while taking this medicine or for 7 months after stopping it. Women must use effective birth control with this medicine. Call your doctor or   health care professional for advice if you get a fever, chills or sore throat, or other symptoms of a cold or flu. Do not treat yourself. Try to avoid being around people who are sick. You may experience fever, chills, and headache during the infusion. Report any side effects during the infusion to your health care professional. What side effects may I notice from receiving this medicine? Side effects that you should report to your doctor or health care professional as soon as possible:  breathing problems  chest pain or  palpitations  dizziness  feeling faint or lightheaded  fever or chills  skin rash, itching or hives  sore throat  swelling of the face, lips, or tongue  swelling of the legs or ankles  unusually weak or tired Side effects that usually do not require medical attention (report to your doctor or health care professional if they continue or are bothersome):  diarrhea  hair loss  nausea, vomiting  tiredness This list may not describe all possible side effects. Call your doctor for medical advice about side effects. You may report side effects to FDA at 1-800-FDA-1088. Where should I keep my medicine? This drug is given in a hospital or clinic and will not be stored at home. NOTE: This sheet is a summary. It may not cover all possible information. If you have questions about this medicine, talk to your doctor, pharmacist, or health care provider.  2020 Elsevier/Gold Standard (2015-04-12 12:08:50)  Coronavirus (COVID-19) Are you at risk?  Are you at risk for the Coronavirus (COVID-19)?  To be considered HIGH RISK for Coronavirus (COVID-19), you have to meet the following criteria:  . Traveled to [place], [place], [place], [place] or [place]; or in the [place] to [place], [place], [place], or [place]; and have fever, cough, and shortness of breath within the last 2 weeks of travel OR . Been in close contact with a person diagnosed with COVID-19 within the last 2 weeks and have fever, cough, and shortness of breath . IF YOU DO NOT MEET THESE CRITERIA, YOU ARE CONSIDERED LOW RISK FOR COVID-19.  What to do if you are HIGH RISK for COVID-19?  Marland Kitchen If you are having a medical emergency, call 911. . Seek medical care right away. Before you go to a doctor's office, urgent care or emergency department, call ahead and tell them about your recent travel, contact with someone diagnosed with COVID-19, and your symptoms. You should receive instructions from your physician's office  regarding next steps of care.  . When you arrive at healthcare provider, tell the healthcare staff immediately you have returned from visiting [place], [place], [place], [place] or [place]; or traveled in the [place] to [place], [place], [place], or [place]; in the last two weeks or you have been in close contact with a person diagnosed with COVID-19 in the last 2 weeks.   . Tell the health care staff about your symptoms: fever, cough and shortness of breath. . After you have been seen by a medical provider, you will be either: o Tested for (COVID-19) and discharged home on quarantine except to seek medical care if symptoms worsen, and asked to  - Stay home and avoid contact with others until you get your results (4-5 days)  - Avoid travel on public transportation if possible (such as bus, train, or airplane) or o Sent to the Emergency Department by EMS for evaluation, COVID-19 testing, and possible admission depending on your condition and  test results.  What to do if you are LOW RISK for COVID-19?  Reduce your risk of any infection by using the same precautions used for avoiding the common cold or flu:  Marland Kitchen Wash your hands often with soap and warm water for at least 20 seconds.  If soap and water are not readily available, use an alcohol-based hand sanitizer with at least 60% alcohol.  . If coughing or sneezing, cover your mouth and nose by coughing or sneezing into the elbow areas of your shirt or coat, into a tissue or into your sleeve (not your hands). . Avoid shaking hands with others and consider head nods or verbal greetings only. . Avoid touching your eyes, nose, or mouth with unwashed hands.  . Avoid close contact with people who are sick. . Avoid places or events with large numbers of people in one location, like concerts or sporting events. . Carefully consider travel plans you have or are making. . If you are planning any travel outside or inside the [place], visit the CDC's  Travelers' Health webpage for the latest health notices. . If you have some symptoms but not all symptoms, continue to monitor at home and seek medical attention if your symptoms worsen. . If you are having a medical emergency, call 911.   [place] / e-Visit: [email or web address]         [facility]: [facility]: [id number]                   [facility]: [phone]

## 2019-02-04 NOTE — Patient Instructions (Signed)

## 2019-02-04 NOTE — Assessment & Plan Note (Signed)
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, grade 3, ER 0%, PR 0%, Ki-67 30%, HER-2 +3+ by IHC; hypoechoic mass 2 o'clock position 1.2 x 1.3 x 1.7 cm biopsy IDC, LV I present, grade 2-3, ER 0%, PR 0%, Ki-67 40%, HER-2 +3+, left axillary lymph node positive (3 nodes noted by US) T3 N1 stage IIIA  Treatment plan 1.Neoadjuvant chemotherapy with TCH Perjeta completed 3/26/2020followed by Herceptin Perjeta maintenance  2.lumpectomywith axillary lymphnode dissection: 07/12/2018 3.Adjuvant radiation therapy6/01/2019-10/14/2018 --------------------------------------------------------------------------------------------------------------------------------------------------- 07/12/2018:Lumpectomy (Dr. Toth): complete treatment response, no residual carcinoma present in breast or 11/11 lymph nodes.  Treatment plan:Herceptin Perjeta maintenanceto be completed December 2020 I briefly discussed the role of neratinib after completion of Herceptin and Perjeta.  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 Left shoulder pain: Getting steroid injection.  Lower extremity edema: Onas neededBumex, much improved Breast cancer surveillance: Mammogram 01/07/2019: Benign breast density category C postoperative changes.  She lost her job at [employer] and is looking for another job.  Patient will come back every 3 weeks for Herceptin Perjeta and every 6 weeks for follow-up with me.  

## 2019-02-07 ENCOUNTER — Telehealth: Payer: Self-pay | Admitting: Hematology and Oncology

## 2019-02-07 NOTE — Telephone Encounter (Signed)
I left a message regarding schedule  

## 2019-02-10 ENCOUNTER — Ambulatory Visit: Payer: Self-pay | Admitting: General Surgery

## 2019-02-14 ENCOUNTER — Ambulatory Visit: Payer: 59 | Admitting: Physical Therapy

## 2019-02-22 ENCOUNTER — Other Ambulatory Visit: Payer: Self-pay

## 2019-02-22 ENCOUNTER — Encounter: Payer: Self-pay | Admitting: Physical Therapy

## 2019-02-22 ENCOUNTER — Ambulatory Visit: Payer: Managed Care, Other (non HMO) | Attending: Orthopedic Surgery | Admitting: Physical Therapy

## 2019-02-22 DIAGNOSIS — M25512 Pain in left shoulder: Secondary | ICD-10-CM | POA: Diagnosis present

## 2019-02-22 DIAGNOSIS — G8929 Other chronic pain: Secondary | ICD-10-CM | POA: Diagnosis present

## 2019-02-22 DIAGNOSIS — R29898 Other symptoms and signs involving the musculoskeletal system: Secondary | ICD-10-CM | POA: Diagnosis present

## 2019-02-22 DIAGNOSIS — M25612 Stiffness of left shoulder, not elsewhere classified: Secondary | ICD-10-CM | POA: Diagnosis present

## 2019-02-22 NOTE — Therapy (Signed)
[facility] [address]  [place], [place], [address] Phone: [phone]   Fax:  [phone]  Physical Therapy Evaluation  Patient Details  Name: Christy Serrano MRN: [id number] Date of Birth: 1955/09/17 Referring Provider (PT): Meredith Pel, MD   Encounter Date: 02/22/2019  PT End of Session - 02/22/19 1227    Visit Number  1    Number of Visits  13    Date for PT Re-Evaluation  04/05/19    Authorization Type  [employer]    PT Start Time  [phone]    PT Stop Time  0930    PT Time Calculation (min)  39 min    Activity Tolerance  Patient tolerated treatment well;Patient limited by pain    Behavior During Therapy  [facility] for tasks assessed/performed       Past Medical History:  Diagnosis Date  . Breast cancer ([place]) 2020   Left Breast Cancer  . Cancer ([place])    left breast  . Chronic bronchitis ([place])   . Family history of breast cancer   . Family history of ovarian cancer   . Family history of prostate cancer   . GERD (gastroesophageal reflux disease)    due to chemotherapy  . History of radiation therapy 09/02/18- 10/14/18   Left Breast 25 fx for a total of 50 Gy, Left SCV, PA 25 fractions for a total of 50 Gy, Left Breast boost 5 fractions, for a total of 10 Gy.   . Lactose intolerance   . Personal history of chemotherapy 2020   Left Breast Cancer  . Personal history of radiation therapy 2020   Left Breast Cancer  . PONV (postoperative nausea and vomiting)     Past Surgical History:  Procedure Laterality Date  . BREAST LUMPECTOMY Left 07/12/2018  . BREAST LUMPECTOMY WITH RADIOACTIVE SEED AND AXILLARY LYMPH NODE DISSECTION Left 07/12/2018   Procedure: LEFT BREAST RADIOACTIVE SEED X 3 LUMPECTOMY X 3  AND AXILLARY LYMPH NODE DISSECTION;  Surgeon: Jovita Kussmaul, MD;  Location: [facility];  Service: General;  Laterality: Left;  . PORTACATH PLACEMENT Right 03/03/2018   Procedure: INSERTION  PORT-A-CATH;  Surgeon: Jovita Kussmaul, MD;  Location: [facility];  Service: General;  Laterality: Right;  . ROTATOR CUFF REPAIR Right 2006  . TOTAL ABDOMINAL HYSTERECTOMY  2010    There were no vitals filed for this visit.   Subjective Assessment - 02/22/19 0852    Subjective  Patient reports L shoulder pain since June 2020. Had a L lumpectomy in April 2020 and had therapy for "cording." During therapy, she started noticing popping in her shoulder. Subsequently had an MRI which showed a partial RTC tear. Discontinued PT d/t radiation therapy, and noticed that her shoulder started locking up. Improved some after a steroid injection. Told by MD for therapy to be "aggressive" to regain ROM. Popping is located over L anterior shoulder with radiation of pain down into biceps. Would like to work on reaching up overhead, reaching behind her back. Has pain when reaching out to the side. Also having tightness over L anterior ribs below her breast. Has one more infusion remaining for breast CA.    Pertinent History  GERD, L breast CA with lumpectomy, radiation, & chemo, R RTC repair 2010    Limitations  Lifting;House hold activities    Diagnostic tests  11/02/18 L shoulder MRI: Focal high-grade partial-thickness bursal surface tear of the distal supraspinatus  tendon at the insertion. No full-thicknesstear. Mild subacromial/subdeltoid bursitis. Mild AC OA.    Patient Stated Goals  "be able to scratch by back"    Currently in Pain?  Yes    Pain Score  0-No pain    Pain Location  Shoulder    Pain Orientation  Left    Pain Descriptors / Indicators  Aching;Dull    Pain Type  Chronic pain         [facility] PT Assessment - 02/22/19 0859      Assessment   Medical Diagnosis  Adhesive Capsulitis of L shoulder    Referring Provider (PT)  Meredith Pel, MD    Onset Date/Surgical Date  08/23/18    Hand Dominance  Right    Next MD Visit  03/15/29    Prior Therapy  yes- s/p lumpectomy       Precautions   Precautions  --   active treatment for breast CA- no e-stim, [place], compression      Balance Screen   Has the patient fallen in the past 6 months  No    Has the patient had a decrease in activity level because of a fear of falling?   No    Is the patient reluctant to leave their home because of a fear of falling?   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    Leisure  none      Cognition   Overall Cognitive Status  Within Functional Limits for tasks assessed      Sensation   Light Touch  --   decreased sensation over L side and breast     Coordination   Gross Motor Movements are Fluid and Coordinated  Yes      Posture/Postural Control   Posture/Postural Control  Postural limitations    Postural Limitations  Forward head;Rounded Shoulders      ROM / Strength   AROM / PROM / Strength  AROM;PROM;Strength      AROM   AROM Assessment Site  Shoulder    Right/Left Shoulder  Right;Left    Right Shoulder Flexion  154 Degrees    Right Shoulder ABduction  180 Degrees    Right Shoulder Internal Rotation  --   FIR T6   Right Shoulder External Rotation  --   FER T3   Left Shoulder Flexion  112 Degrees   "pop"   Left Shoulder ABduction  93 Degrees   slight pain   Left Shoulder Internal Rotation  --   FIR T12 with pain   Left Shoulder External Rotation  --   FER to back of head     PROM   PROM Assessment Site  Shoulder      Strength   Strength Assessment Site  Shoulder    Right/Left Shoulder  Right;Left    Right Shoulder Flexion  4+/5    Right Shoulder ABduction  4+/5    Right Shoulder Internal Rotation  4+/5    Right Shoulder External Rotation  4/5     Left Shoulder Flexion  4+/5  Left Shoulder ABduction  4/5    Left Shoulder Internal Rotation  4+/5    Left Shoulder External Rotation  4/5      Palpation   Palpation comment  TTP and soft tissue restriction is L proximal biceps tendon and biceps muscle belly, pec; tenderness over L lats                    PT Education - 02/22/19 1227    Education Details  prognosis, POC, HEP    Person(s) Educated  Patient    Methods  Explanation;Demonstration;Tactile cues;Verbal cues;Handout    Comprehension  Verbalized understanding;Returned demonstration       PT Short Term Goals - 02/22/19 1237      PT SHORT TERM GOAL #1   Title  Patient to be indpendent with initial HEP.    Time  3    Period  Weeks    Status  New    Target Date  03/15/19        PT Long Term Goals - 02/22/19 1237      PT LONG TERM GOAL #1   Title  Patient to be independent with advanced HEP.    Time  6    Period  Weeks    Status  New    Target Date  04/05/19      PT LONG TERM GOAL #2   Title  Patient to demonstrate L shoulder AROM WFL and without pain limiting.    Time  6    Period  Weeks    Status  New    Target Date  04/05/19      PT LONG TERM GOAL #3   Title  Patient to demonstrate L shoulder strength >/=4+/5.    Time  6    Period  Weeks    Status  New    Target Date  04/05/19      PT LONG TERM GOAL #4   Title  Patient to report 75% improvement in ability to reach behind her back.    Time  6    Period  Weeks    Status  New    Target Date  04/05/19             Plan - 02/22/19 1227    Clinical Impression Statement  Patient is a 63y/o F presenting to OPPT with c/o chronic L shoulder pain of 6 months duration. Patient's PMH significant for breast CA treated with L breast lumpectomy in April 2020 as well as radiation and infusion therapy. Notes that she started to receive PT after her lumpectomy, but had to discontinue d/t pain from radiation. Also notes that at this time she  started to notice popping in her shoulder, with MRI subsequently revealing a partial thickness supraspinatus tear. Since this time, she has noticed difficulty moving her shoulder. Would particularly like to address reaching overhead, behind back into IR, and out into abduction. Patient today with limited L shoulder ROM, decreased strength, rounded posture, and tenderness and soft tissue restriction over L proximal biceps tendon and biceps muscle belly. Patient educated on gentle stretching HEP- patient reported understanding. Would benefit from skilled PT services 2x/week for 6 weeks to address aforementioned impairments.    Personal Factors and Comorbidities  Age;Sex;Comorbidity 3+;Time since onset of injury/illness/exacerbation;Past/Current Experience    Comorbidities  GERD, L breast CA with lumpectomy, radiation, & chemo, R RTC repair 2010    Examination-Activity Limitations  Bathing;Carry;Dressing;Hygiene/Grooming;Lift;Reach Overhead    Examination-Participation Restrictions  Cleaning;Shop;Community Activity;Driving;Yard Work;Interpersonal Relationship;Laundry;Meal Prep    Stability/Clinical Decision Making  Stable/Uncomplicated    Clinical Decision Making  Low    Rehab Potential  Good    PT Frequency  2x / week    PT Duration  6 weeks    PT Treatment/Interventions  ADLs/Self Care Home Management;Cryotherapy;Moist Heat;Therapeutic exercise;Therapeutic activities;Functional mobility training;Neuromuscular re-education;Patient/family education;Manual techniques;Taping;Splinting;Energy conservation;Dry needling;Passive range of motion;Scar mobilization    PT Next Visit Plan  reassess HEP; FOTO    Consulted and Agree with Plan of Care  Patient       Patient will benefit from skilled therapeutic intervention in order to improve the following deficits and impairments:  Hypomobility, Decreased activity tolerance, Decreased strength, Increased fascial restricitons, Impaired UE functional use, Pain,  Improper body mechanics, Decreased range of motion, Postural dysfunction  Visit Diagnosis: Chronic left shoulder pain  Stiffness of left shoulder, not elsewhere classified  Other symptoms and signs involving the musculoskeletal system     Problem List Patient Active Problem List   Diagnosis Date Noted  . Carcinoma of upper-outer quadrant of left breast in female, estrogen receptor negative ([place]) 07/12/2018  . Seizure-like activity ([place])   . Hyponatremia   . Recurrent syncope 03/08/2018  . LFT elevation   . Seizure ([place])   . Thrombocytopenia ([place])   . Port-A-Cath in place 03/04/2018  . Genetic testing 02/23/2018  . Family history of breast cancer   . Family history of prostate cancer   . Family history of ovarian cancer   . Malignant neoplasm of left female breast ([place]) 02/03/2018  . Prediabetes 01/06/2018  . Reactive airway disease without complication [id number]  . Class 1 obesity with serious comorbidity and body mass index (BMI) of 32.0 to 32.9 in adult 12/22/2017     Janene Harvey, PT, DPT 02/22/19 12:40 PM   [facility] [address]  [place], [place], [address] Phone: [phone]   Fax:  [phone]  Name: Christy Serrano MRN: [id number] Date of Birth: [birth year]

## 2019-02-23 ENCOUNTER — Encounter (INDEPENDENT_AMBULATORY_CARE_PROVIDER_SITE_OTHER): Payer: Self-pay | Admitting: Bariatrics

## 2019-02-23 ENCOUNTER — Ambulatory Visit (INDEPENDENT_AMBULATORY_CARE_PROVIDER_SITE_OTHER): Payer: 59 | Admitting: Bariatrics

## 2019-02-23 VITALS — BP 121/57 | HR 64 | Temp 98.3°F | Ht 65.0 in | Wt 173.0 lb

## 2019-02-23 DIAGNOSIS — R7303 Prediabetes: Secondary | ICD-10-CM | POA: Diagnosis not present

## 2019-02-23 DIAGNOSIS — Z6830 Body mass index (BMI) 30.0-30.9, adult: Secondary | ICD-10-CM | POA: Diagnosis not present

## 2019-02-23 DIAGNOSIS — R7989 Other specified abnormal findings of blood chemistry: Secondary | ICD-10-CM | POA: Diagnosis not present

## 2019-02-23 DIAGNOSIS — E669 Obesity, unspecified: Secondary | ICD-10-CM | POA: Diagnosis not present

## 2019-02-23 NOTE — Progress Notes (Signed)
Office: [phone]  /  Fax: [phone]   HPI:   Chief Complaint: OBESITY Christy Serrano is here to discuss her progress with her obesity treatment plan. She is on the Category 2 plan and is following her eating plan approximately 75% of the time. She states she is exercising 0 minutes 0 times per week. Christy Serrano is up 4 lbs. She is trying to get in more water, but probably not enough.  Her weight is 173 lb (78.5 kg) today and has had a weight gain of 4 lbs since her last visit. She has lost 22 lbs since starting treatment with [place].  Pre-Diabetes Christy Serrano has a diagnosis of prediabetes based on her elevated Hgb A1c and was informed this puts her at greater risk of developing diabetes. She is on no medications currently and continues to work on diet and exercise to decrease risk of diabetes. She denies nausea or hypoglycemia. No polyphagia.  Elevated Liver Function Tests Christy Serrano has a diagnosis of elevated liver function tests. She denies abdominal pain.  ASSESSMENT AND PLAN:  Prediabetes  Elevated LFTs  Class 1 obesity with serious comorbidity and body mass index (BMI) of 30.0 to 30.9 in adult, unspecified obesity type - starting BMI greater then 30  PLAN:  Pre-Diabetes Christy Serrano will continue to work on weight loss, exercise, and decreasing simple carbohydrates in her diet to help decrease the risk of diabetes. We dicussed metformin including benefits and risks. She was informed that eating too many simple carbohydrates or too many calories at one sitting increases the likelihood of GI side effects. Christy Serrano will decrease carbohydrates and increase protein and healthy fats.  Elevated Liver Function Tests We will monitor Christy Serrano's labs over time.  Obesity Christy Serrano is currently in the action stage of change. As such, her goal is to continue with weight loss efforts. She has agreed to follow the Category 2 plan + 100 calories and journal 1300 calories + 80 grams of protein. Christy Serrano  will work on meal planning, increase water intake, and increase protein. She was given handout on Holiday Favorites and Strategies for the Wisdom. Christy Serrano has been instructed to start walking again for weight loss and overall health benefits. We discussed the following Behavioral Modification Strategies today: increasing lean protein intake, decreasing simple carbohydrates, increasing vegetables, increase H20 intake, decrease eating out, no skipping meals, work on meal planning and easy cooking plans, and keeping healthy foods in the home.  Christy Serrano has agreed to follow-up with our clinic in 4 weeks. She was informed of the importance of frequent follow-up visits to maximize her success with intensive lifestyle modifications for her multiple health conditions.  ALLERGIES: Allergies  Allergen Reactions  . Oxycodone-Acetaminophen Other (See Comments) and Palpitations    tachycardia   . Gabapentin Swelling    Lower extremity edema  . Amoxicillin Rash    DID THE REACTION INVOLVE: Swelling of the face/tongue/throat, SOB, or low BP? No Sudden or severe rash/hives, skin peeling, or the inside of the mouth or nose? No Did it require medical treatment? No When did it last happen?in her 5's If all above answers are "NO", may proceed with cephalosporin use.     MEDICATIONS: Current Outpatient Medications on File Prior to Visit  Medication Sig Dispense Refill  . acetaminophen (TYLENOL) 500 MG tablet Take 1,000 mg by mouth every 6 (six) hours as needed for mild pain.    Marland Kitchen albuterol (PROVENTIL HFA;VENTOLIN HFA) 108 (90 Base) MCG/ACT inhaler Inhale into the lungs every 6 (six) hours as needed  for wheezing or shortness of breath.    . budesonide-formoterol (SYMBICORT) 160-4.5 MCG/ACT inhaler Inhale 2 puffs into the lungs 2 (two) times daily.    . Calcium-Phosphorus-Vitamin D (CITRACAL +D3 PO) Take 1,200 mcg by mouth.    . cholecalciferol (VITAMIN D) 1000 units tablet Take 1,000 Units by mouth  daily.    Marland Kitchen estradiol (VIVELLE-DOT) 0.05 MG/24HR patch     . gabapentin (NEURONTIN) 100 MG capsule Take 2 capsules (200 mg total) by mouth daily. 90 capsule 3  . HYDROcodone-acetaminophen (NORCO/VICODIN) 5-325 MG tablet Take 1 tablet by mouth every 6 (six) hours as needed for moderate pain or severe pain. 30 tablet 0  . methocarbamol (ROBAXIN) 500 MG tablet 1 po q 8 hrs prn 30 tablet 0  . Multiple Vitamin (MULTIVITAMIN) capsule Take 1 capsule by mouth daily.    Marland Kitchen spironolactone (ALDACTONE) 25 MG tablet Take 0.5 tablets (12.5 mg total) by mouth daily. 45 tablet 3   No current facility-administered medications on file prior to visit.     PAST MEDICAL HISTORY: Past Medical History:  Diagnosis Date  . Breast cancer ([place]) 2020   Left Breast Cancer  . Cancer ([place])    left breast  . Chronic bronchitis ([place])   . Family history of breast cancer   . Family history of ovarian cancer   . Family history of prostate cancer   . GERD (gastroesophageal reflux disease)    due to chemotherapy  . History of radiation therapy 09/02/18- 10/14/18   Left Breast 25 fx for a total of 50 Gy, Left SCV, PA 25 fractions for a total of 50 Gy, Left Breast boost 5 fractions, for a total of 10 Gy.   . Lactose intolerance   . Personal history of chemotherapy 2020   Left Breast Cancer  . Personal history of radiation therapy 2020   Left Breast Cancer  . PONV (postoperative nausea and vomiting)     PAST SURGICAL HISTORY: Past Surgical History:  Procedure Laterality Date  . BREAST LUMPECTOMY Left 07/12/2018  . BREAST LUMPECTOMY WITH RADIOACTIVE SEED AND AXILLARY LYMPH NODE DISSECTION Left 07/12/2018   Procedure: LEFT BREAST RADIOACTIVE SEED X 3 LUMPECTOMY X 3  AND AXILLARY LYMPH NODE DISSECTION;  Surgeon: Jovita Kussmaul, MD;  Location: [facility];  Service: General;  Laterality: Left;  . PORTACATH PLACEMENT Right 03/03/2018   Procedure: INSERTION PORT-A-CATH;  Surgeon: Jovita Kussmaul, MD;  Location:  [facility];  Service: General;  Laterality: Right;  . ROTATOR CUFF REPAIR Right 2006  . TOTAL ABDOMINAL HYSTERECTOMY  2010    SOCIAL HISTORY: Social History   Tobacco Use  . Smoking status: Never Smoker  . Smokeless tobacco: Never Used  Substance Use Topics  . Alcohol use: No    Comment: quit long ago  . Drug use: No    FAMILY HISTORY: Family History  Problem Relation Age of Onset  . High blood pressure Mother   . Heart attack Mother        CHF  . High blood pressure Father   . Prostate cancer Father        dx mid 23s  . Breast cancer Sister 64       negative [employer] 46 gene panel  . Breast cancer Paternal Aunt        dx mid 75s  . Cancer Paternal Uncle        jaw cancer d. 85s  . Ovarian cancer Maternal Grandmother  d. 20s  . Cancer Paternal Grandmother        either stomach or ovarian cancer  . Breast cancer Sister 55       "negative 21 gene panel"   ROS: Review of Systems  Gastrointestinal: Negative for abdominal pain and nausea.  Endo/Heme/Allergies:       Negative for hypoglycemia. Negative for polyphagia.   PHYSICAL EXAM: Blood pressure (!) 121/57, pulse 64, temperature 98.3 F (36.8 C), height 5\' 5"  (1.651 m), weight 173 lb (78.5 kg), SpO2 99 %. Body mass index is 28.79 kg/m. Physical Exam Vitals signs reviewed.  Constitutional:      Appearance: Normal appearance. She is obese.  Cardiovascular:     Rate and Rhythm: Normal rate.     Pulses: Normal pulses.  Pulmonary:     Effort: Pulmonary effort is normal.     Breath sounds: Normal breath sounds.  Musculoskeletal: Normal range of motion.  Skin:    General: Skin is warm and dry.  Neurological:     Mental Status: She is alert and oriented to person, place, and time.  Psychiatric:        Behavior: Behavior normal.   RECENT LABS AND TESTS: BMET    Component Value Date/Time   NA 139 02/04/2019 0932   NA 138 12/22/2017 1235   K 4.3 02/04/2019 0932   CL 104 02/04/2019  0932   CO2 27 02/04/2019 0932   GLUCOSE 85 02/04/2019 0932   BUN 17 02/04/2019 0932   BUN 12 12/22/2017 1235   CREATININE 1.05 (H) 02/04/2019 0932   CALCIUM 9.5 02/04/2019 0932   GFRNONAA 57 (L) 02/04/2019 0932   GFRAA >60 02/04/2019 0932   Lab Results  Component Value Date   HGBA1C 5.7 (H) 10/20/2018   HGBA1C 5.6 05/31/2018   HGBA1C 6.1 (H) 12/22/2017   Lab Results  Component Value Date   INSULIN 4.8 05/31/2018   INSULIN 13.7 12/22/2017   CBC    Component Value Date/Time   WBC 3.4 (L) 02/04/2019 0932   WBC 3.9 (L) 08/20/2018 0845   RBC 3.49 (L) 02/04/2019 0932   HGB 10.5 (L) 02/04/2019 0932   HGB 12.3 12/22/2017 1235   HCT 33.3 (L) 02/04/2019 0932   HCT 38.2 12/22/2017 1235   PLT 180 02/04/2019 0932   MCV 95.4 02/04/2019 0932   MCV 92 12/22/2017 1235   MCH 30.1 02/04/2019 0932   MCHC 31.5 02/04/2019 0932   RDW 14.6 02/04/2019 0932   RDW 14.8 12/22/2017 1235   LYMPHSABS 0.8 02/04/2019 0932   LYMPHSABS 2.0 12/22/2017 1235   MONOABS 0.3 02/04/2019 0932   EOSABS 0.0 02/04/2019 0932   EOSABS 0.0 12/22/2017 1235   BASOSABS 0.0 02/04/2019 0932   BASOSABS 0.0 12/22/2017 1235   Iron/TIBC/Ferritin/ %Sat No results found for: IRON, TIBC, FERRITIN, IRONPCTSAT Lipid Panel     Component Value Date/Time   CHOL 176 12/22/2017 1235   TRIG 111 12/22/2017 1235   HDL 61 12/22/2017 1235   LDLCALC 93 12/22/2017 1235   Hepatic Function Panel     Component Value Date/Time   PROT 7.6 02/04/2019 0932   PROT 7.6 12/22/2017 1235   ALBUMIN 4.0 02/04/2019 0932   ALBUMIN 4.8 12/22/2017 1235   AST 17 02/04/2019 0932   ALT 13 02/04/2019 0932   ALKPHOS 89 02/04/2019 0932   BILITOT 0.5 02/04/2019 0932      Component Value Date/Time   TSH 3.644 03/10/2018 0538   TSH 1.930 12/22/2017 1235   Results for  JESENIA, DILLY (MRN [id number]) as of 02/23/2019 10:07  Ref. Range 10/20/2018 15:50  Vitamin D, 25-Hydroxy Latest Ref Range: 30.0 - 100.0 ng/mL 46.6   OBESITY BEHAVIORAL  INTERVENTION VISIT  Today's visit was #18    Starting weight: 195 lbs Starting date: 12/22/2017 Today's weight: 173 lbs Today's date: 02/23/2019 Total lbs lost to date: 22      02/23/2019  Height 5\' 5"  (1.651 m)  Weight 173 lb (78.5 kg)  BMI (Calculated) 28.79  BLOOD PRESSURE - SYSTOLIC [id number]  BLOOD PRESSURE - DIASTOLIC 57   Body Fat % 99991111 %  Total Body Water (lbs) 76.2 lbs   ASK: We discussed the diagnosis of obesity with Christy Serrano today and Christy Serrano agreed to give [place] permission to discuss obesity behavioral modification therapy today.  ASSESS: Christy Serrano has the diagnosis of obesity and her BMI today is 28.8. Christy Serrano is in the action stage of change.   ADVISE: Christy Serrano was educated on the multiple health risks of obesity as well as the benefit of weight loss to improve her health. She was advised of the need for long term treatment and the importance of lifestyle modifications to improve her current health and to decrease her risk of future health problems.  AGREE: Multiple dietary modification options and treatment options were discussed and  Christy Serrano agreed to follow the recommendations documented in the above note.  ARRANGE: Christy Serrano was educated on the importance of frequent visits to treat obesity as outlined per CMS and USPSTF guidelines and agreed to schedule her next follow up appointment today.  Migdalia Dk, am acting as Location manager for [employer], DO  I have reviewed the above documentation for accuracy and completeness, and I agree with the above. -Jearld Lesch, DO

## 2019-02-24 ENCOUNTER — Ambulatory Visit: Payer: Managed Care, Other (non HMO)

## 2019-02-25 ENCOUNTER — Inpatient Hospital Stay: Payer: Managed Care, Other (non HMO)

## 2019-02-25 ENCOUNTER — Encounter: Payer: Self-pay | Admitting: *Deleted

## 2019-02-25 ENCOUNTER — Other Ambulatory Visit: Payer: Self-pay

## 2019-02-25 ENCOUNTER — Inpatient Hospital Stay: Payer: Managed Care, Other (non HMO) | Attending: Hematology and Oncology

## 2019-02-25 VITALS — BP 123/66 | HR 64 | Temp 98.2°F | Resp 16

## 2019-02-25 DIAGNOSIS — Z171 Estrogen receptor negative status [ER-]: Secondary | ICD-10-CM | POA: Diagnosis not present

## 2019-02-25 DIAGNOSIS — Z9221 Personal history of antineoplastic chemotherapy: Secondary | ICD-10-CM | POA: Diagnosis not present

## 2019-02-25 DIAGNOSIS — Z95828 Presence of other vascular implants and grafts: Secondary | ICD-10-CM

## 2019-02-25 DIAGNOSIS — C50412 Malignant neoplasm of upper-outer quadrant of left female breast: Secondary | ICD-10-CM | POA: Diagnosis present

## 2019-02-25 DIAGNOSIS — Z5112 Encounter for antineoplastic immunotherapy: Secondary | ICD-10-CM | POA: Insufficient documentation

## 2019-02-25 DIAGNOSIS — Z79899 Other long term (current) drug therapy: Secondary | ICD-10-CM | POA: Insufficient documentation

## 2019-02-25 DIAGNOSIS — Z923 Personal history of irradiation: Secondary | ICD-10-CM | POA: Diagnosis not present

## 2019-02-25 LAB — CBC WITH DIFFERENTIAL (CANCER CENTER ONLY)
Abs Immature Granulocytes: 0.01 10*3/uL (ref 0.00–0.07)
Basophils Absolute: 0 10*3/uL (ref 0.0–0.1)
Basophils Relative: 0 %
Eosinophils Absolute: 0 10*3/uL (ref 0.0–0.5)
Eosinophils Relative: 1 %
HCT: 32.1 % — ABNORMAL LOW (ref 36.0–46.0)
Hemoglobin: 10.3 g/dL — ABNORMAL LOW (ref 12.0–15.0)
Immature Granulocytes: 0 %
Lymphocytes Relative: 30 %
Lymphs Abs: 1 10*3/uL (ref 0.7–4.0)
MCH: 29.9 pg (ref 26.0–34.0)
MCHC: 32.1 g/dL (ref 30.0–36.0)
MCV: 93.3 fL (ref 80.0–100.0)
Monocytes Absolute: 0.3 10*3/uL (ref 0.1–1.0)
Monocytes Relative: 9 %
Neutro Abs: 2.1 10*3/uL (ref 1.7–7.7)
Neutrophils Relative %: 60 %
Platelet Count: 168 10*3/uL (ref 150–400)
RBC: 3.44 MIL/uL — ABNORMAL LOW (ref 3.87–5.11)
RDW: 14.3 % (ref 11.5–15.5)
WBC Count: 3.4 10*3/uL — ABNORMAL LOW (ref 4.0–10.5)
nRBC: 0 % (ref 0.0–0.2)

## 2019-02-25 LAB — CMP (CANCER CENTER ONLY)
ALT: 19 U/L (ref 0–44)
AST: 21 U/L (ref 15–41)
Albumin: 3.8 g/dL (ref 3.5–5.0)
Alkaline Phosphatase: 80 U/L (ref 38–126)
Anion gap: 9 (ref 5–15)
BUN: 19 mg/dL (ref 8–23)
CO2: 26 mmol/L (ref 22–32)
Calcium: 8.9 mg/dL (ref 8.9–10.3)
Chloride: 105 mmol/L (ref 98–111)
Creatinine: 1.07 mg/dL — ABNORMAL HIGH (ref 0.44–1.00)
GFR, Est AFR Am: >60 mL/min (ref >60)
GFR, Estimated: 56 mL/min — ABNORMAL LOW (ref >60)
Glucose, Bld: 84 mg/dL (ref 70–99)
Potassium: 4.3 mmol/L (ref 3.5–5.1)
Sodium: 140 mmol/L (ref 135–145)
Total Bilirubin: 0.6 mg/dL (ref 0.3–1.2)
Total Protein: 7.1 g/dL (ref 6.5–8.1)

## 2019-02-25 MED ORDER — ACETAMINOPHEN 325 MG PO TABS
650.0000 mg | ORAL_TABLET | Freq: Once | ORAL | Status: AC
  Administered 2019-02-25: 650 mg via ORAL

## 2019-02-25 MED ORDER — DIPHENHYDRAMINE HCL 25 MG PO CAPS
ORAL_CAPSULE | ORAL | Status: AC
  Filled 2019-02-25: qty 2

## 2019-02-25 MED ORDER — SODIUM CHLORIDE 0.9% FLUSH
10.0000 mL | INTRAVENOUS | Status: DC | PRN
  Filled 2019-02-25: qty 10

## 2019-02-25 MED ORDER — SODIUM CHLORIDE 0.9 % IV SOLN
420.0000 mg | Freq: Once | INTRAVENOUS | Status: AC
  Administered 2019-02-25: 420 mg via INTRAVENOUS
  Filled 2019-02-25: qty 14

## 2019-02-25 MED ORDER — SODIUM CHLORIDE 0.9 % IV SOLN
Freq: Once | INTRAVENOUS | Status: AC
  Administered 2019-02-25: 09:00:00 via INTRAVENOUS
  Filled 2019-02-25: qty 250

## 2019-02-25 MED ORDER — HEPARIN SOD (PORK) LOCK FLUSH 100 UNIT/ML IV SOLN
500.0000 [IU] | Freq: Once | INTRAVENOUS | Status: AC | PRN
  Administered 2019-02-25: 500 [IU]
  Filled 2019-02-25: qty 5

## 2019-02-25 MED ORDER — DIPHENHYDRAMINE HCL 25 MG PO CAPS
50.0000 mg | ORAL_CAPSULE | Freq: Once | ORAL | Status: AC
  Administered 2019-02-25: 50 mg via ORAL

## 2019-02-25 MED ORDER — TRASTUZUMAB CHEMO 150 MG IV SOLR
450.0000 mg | Freq: Once | INTRAVENOUS | Status: AC
  Administered 2019-02-25: 450 mg via INTRAVENOUS
  Filled 2019-02-25: qty 21.43

## 2019-02-25 MED ORDER — ACETAMINOPHEN 325 MG PO TABS
ORAL_TABLET | ORAL | Status: AC
  Filled 2019-02-25: qty 2

## 2019-02-25 MED ORDER — SODIUM CHLORIDE 0.9% FLUSH
10.0000 mL | INTRAVENOUS | Status: DC | PRN
  Administered 2019-02-25: 10 mL
  Filled 2019-02-25: qty 10

## 2019-02-25 NOTE — Patient Instructions (Signed)
[facility] Discharge Instructions for Patients Receiving Chemotherapy  Today you received the following immunotherapy agents:  Trastuzumab, & Pertuzumab  To help prevent nausea and vomiting after your treatment, we encourage you to take your nausea medication as needed.   If you develop nausea and vomiting that is not controlled by your nausea medication, call the clinic.   BELOW ARE SYMPTOMS THAT SHOULD BE REPORTED IMMEDIATELY:  *FEVER GREATER THAN 100.5 F  *CHILLS WITH OR WITHOUT FEVER  NAUSEA AND VOMITING THAT IS NOT CONTROLLED WITH YOUR NAUSEA MEDICATION  *UNUSUAL SHORTNESS OF BREATH  *UNUSUAL BRUISING OR BLEEDING  TENDERNESS IN MOUTH AND THROAT WITH OR WITHOUT PRESENCE OF ULCERS  *URINARY PROBLEMS  *BOWEL PROBLEMS  UNUSUAL RASH Items with * indicate a potential emergency and should be followed up as soon as possible.  Feel free to call the clinic should you have any questions or concerns. The clinic phone number is [phone].  Please show the [place] at check-in to the Emergency Department and triage nurse.

## 2019-03-04 ENCOUNTER — Encounter: Payer: Self-pay | Admitting: Physical Therapy

## 2019-03-04 ENCOUNTER — Other Ambulatory Visit: Payer: Self-pay

## 2019-03-04 ENCOUNTER — Ambulatory Visit: Payer: Managed Care, Other (non HMO) | Admitting: Physical Therapy

## 2019-03-04 DIAGNOSIS — M25512 Pain in left shoulder: Secondary | ICD-10-CM | POA: Diagnosis not present

## 2019-03-04 DIAGNOSIS — R29898 Other symptoms and signs involving the musculoskeletal system: Secondary | ICD-10-CM

## 2019-03-04 DIAGNOSIS — M25612 Stiffness of left shoulder, not elsewhere classified: Secondary | ICD-10-CM

## 2019-03-04 DIAGNOSIS — G8929 Other chronic pain: Secondary | ICD-10-CM

## 2019-03-04 NOTE — Therapy (Signed)
[facility] [address]  [place], [place], [address] Phone: [phone]   Fax:  [phone]  Physical Therapy Treatment  Patient Details  Name: Christy Serrano MRN: [id number] Date of Birth: [birth year] Referring Provider (PT): Meredith Pel, MD   Encounter Date: 03/04/2019  PT End of Session - 03/04/19 0851    Visit Number  2    Number of Visits  13    Date for PT Re-Evaluation  04/05/19    Authorization Type  [employer]    PT Start Time  0802    PT Stop Time  0846    PT Time Calculation (min)  44 min    Activity Tolerance  Patient tolerated treatment well;Patient limited by pain    Behavior During Therapy  [facility] for tasks assessed/performed       Past Medical History:  Diagnosis Date  . Breast cancer ([place]) 2020   Left Breast Cancer  . Cancer ([place])    left breast  . Chronic bronchitis ([place])   . Family history of breast cancer   . Family history of ovarian cancer   . Family history of prostate cancer   . GERD (gastroesophageal reflux disease)    due to chemotherapy  . History of radiation therapy 09/02/18- 10/14/18   Left Breast 25 fx for a total of 50 Gy, Left SCV, PA 25 fractions for a total of 50 Gy, Left Breast boost 5 fractions, for a total of 10 Gy.   . Lactose intolerance   . Personal history of chemotherapy 2020   Left Breast Cancer  . Personal history of radiation therapy 2020   Left Breast Cancer  . PONV (postoperative nausea and vomiting)     Past Surgical History:  Procedure Laterality Date  . BREAST LUMPECTOMY Left 07/12/2018  . BREAST LUMPECTOMY WITH RADIOACTIVE SEED AND AXILLARY LYMPH NODE DISSECTION Left 07/12/2018   Procedure: LEFT BREAST RADIOACTIVE SEED X 3 LUMPECTOMY X 3  AND AXILLARY LYMPH NODE DISSECTION;  Surgeon: Jovita Kussmaul, MD;  Location: [place];  Service: General;  Laterality: Left;  . PORTACATH PLACEMENT Right 03/03/2018   Procedure: INSERTION  PORT-A-CATH;  Surgeon: Jovita Kussmaul, MD;  Location: [place];  Service: General;  Laterality: Right;  . ROTATOR CUFF REPAIR Right 2006  . TOTAL ABDOMINAL HYSTERECTOMY  2010    There were no vitals filed for this visit.  Subjective Assessment - 03/04/19 0803    Subjective  Only able to perform the laying down exercises- the other ones are tough.    Pertinent History  GERD, L breast CA with lumpectomy, radiation, & chemo, R RTC repair 2010    Diagnostic tests  11/02/18 L shoulder MRI: Focal high-grade partial-thickness bursal surface tear of the distal supraspinatus tendon at the insertion. No full-thicknesstear. Mild subacromial/subdeltoid bursitis. Mild AC OA.    Patient Stated Goals  "be able to scratch by back"    Currently in Pain?  No/denies         [facility] PT Assessment - 03/04/19 0001      Observation/Other Assessments   Focus on Therapeutic Outcomes (FOTO)   Shoulder: 67 (33% limited, 28% predicted)              [facility] Adult PT Treatment/Exercise - 03/04/19 0001      Exercises   Exercises  Shoulder      Shoulder Exercises: Pulleys   Flexion  3 minutes  Flexion Limitations  to tolerance    Scaption  3 minutes    Scaption Limitations  to tolerance      Shoulder Exercises: Stretch   Corner Stretch  2 reps;30 seconds    Corner Stretch Limitations  L 90/90 pec stretch   cues to lower elbow for comfort   Other Shoulder Stretches  L lat stretch on wall 2x30"      Manual Therapy   Manual Therapy  Passive ROM;Soft tissue mobilization;Myofascial release    Manual therapy comments  supine; R sidelying    Soft tissue mobilization  STM to L pec, proximal biceps tendon and muscle belly, lats- soft tissue restriction evident over lats and pec    Passive ROM  L shoulder PROM in all planes- most limited in abduction d/t lats pain and restriction; L abduction PROM with stabilization over the lats to encourage muscle stretch               PT Short  Term Goals - 03/04/19 0855      PT SHORT TERM GOAL #1   Title  Patient to be indpendent with initial HEP.    Time  3    Period  Weeks    Status  On-going    Target Date  03/15/19        PT Long Term Goals - 03/04/19 0855      PT LONG TERM GOAL #1   Title  Patient to be independent with advanced HEP.    Time  6    Period  Weeks    Status  On-going      PT LONG TERM GOAL #2   Title  Patient to demonstrate L shoulder AROM WFL and without pain limiting.    Time  6    Period  Weeks    Status  On-going      PT LONG TERM GOAL #3   Title  Patient to demonstrate L shoulder strength >/=4+/5.    Time  6    Period  Weeks    Status  On-going      PT LONG TERM GOAL #4   Title  Patient to report 75% improvement in ability to reach behind her back.    Time  6    Period  Weeks    Status  On-going            Plan - 03/04/19 0851    Clinical Impression Statement  Patient reporting that she has been somewhat compliant with HEP- notes that her standing exercises are the most difficult for her. Patient tolerated supine L shoulder PROM with most limitation in abduction d/t lat tightness. Addressed this with STM over lats, pec, and biceps with good tolerance. Continued to work on abduction ROM with sidelying stretching to patient's tolerance. Reviewed standing stretches for improved comfort and carryover. Patient did require corrective cues and slight modification to stretches given to improve tolerance. Patient reported understanding and with no complaints at end of session. Plan to progress per POC.    Comorbidities  GERD, L breast CA with lumpectomy, radiation, & chemo, R RTC repair 2010    PT Treatment/Interventions  ADLs/Self Care Home Management;Cryotherapy;Moist Heat;Therapeutic exercise;Therapeutic activities;Functional mobility training;Neuromuscular re-education;Patient/family education;Manual techniques;Taping;Splinting;Energy conservation;Dry needling;Passive range of motion;Scar  mobilization    PT Next Visit Plan  PROM, stretching, STM    Consulted and Agree with Plan of Care  Patient       Patient will benefit from skilled therapeutic intervention in order  to improve the following deficits and impairments:  Hypomobility, Decreased activity tolerance, Decreased strength, Increased fascial restricitons, Impaired UE functional use, Pain, Improper body mechanics, Decreased range of motion, Postural dysfunction  Visit Diagnosis: Chronic left shoulder pain  Stiffness of left shoulder, not elsewhere classified  Other symptoms and signs involving the musculoskeletal system     Problem List Patient Active Problem List   Diagnosis Date Noted  . Carcinoma of upper-outer quadrant of left breast in female, estrogen receptor negative ([place]) 07/12/2018  . Seizure-like activity ([place])   . Hyponatremia   . Recurrent syncope 03/08/2018  . LFT elevation   . Seizure ([place])   . Thrombocytopenia ([place])   . Port-A-Cath in place 03/04/2018  . Genetic testing 02/23/2018  . Family history of breast cancer   . Family history of prostate cancer   . Family history of ovarian cancer   . Malignant neoplasm of left female breast ([place]) 02/03/2018  . Prediabetes 01/06/2018  . Reactive airway disease without complication [id number]  . Class 1 obesity with serious comorbidity and body mass index (BMI) of 32.0 to 32.9 in adult 12/22/2017     Janene Harvey, PT, DPT 03/04/19 8:57 AM   [facility] [address]  [place], [place], [address] Phone: [phone]   Fax:  [phone]  Name: Christy Serrano MRN: [id number] Date of Birth: [birth year]

## 2019-03-07 MED FILL — SPIRONOLACTONE 25 MG TABS: 25 | 30 days supply | Qty: 45 | Fill #1

## 2019-03-08 ENCOUNTER — Encounter: Payer: 59 | Admitting: Physical Therapy

## 2019-03-09 ENCOUNTER — Encounter (HOSPITAL_COMMUNITY): Payer: 59 | Admitting: Internal Medicine

## 2019-03-09 ENCOUNTER — Other Ambulatory Visit (HOSPITAL_COMMUNITY): Payer: 59

## 2019-03-10 ENCOUNTER — Other Ambulatory Visit: Payer: Self-pay

## 2019-03-10 ENCOUNTER — Ambulatory Visit: Payer: Managed Care, Other (non HMO)

## 2019-03-10 DIAGNOSIS — M25512 Pain in left shoulder: Secondary | ICD-10-CM | POA: Diagnosis not present

## 2019-03-10 DIAGNOSIS — R29898 Other symptoms and signs involving the musculoskeletal system: Secondary | ICD-10-CM

## 2019-03-10 DIAGNOSIS — G8929 Other chronic pain: Secondary | ICD-10-CM

## 2019-03-10 DIAGNOSIS — M25612 Stiffness of left shoulder, not elsewhere classified: Secondary | ICD-10-CM

## 2019-03-10 NOTE — Therapy (Signed)
[facility] [address]  [place], [place], [address] Phone: [phone]   Fax:  [phone]  Physical Therapy Treatment  Patient Details  Name: Christy Serrano MRN: [id number] Date of Birth: [birth year] Referring Provider (PT): Meredith Pel, MD   Encounter Date: 03/10/2019  PT End of Session - 03/10/19 0832    Visit Number  3    Number of Visits  13    Date for PT Re-Evaluation  04/05/19    Authorization Type  [employer]    PT Start Time  0803    PT Stop Time  0845    PT Time Calculation (min)  42 min    Activity Tolerance  Patient tolerated treatment well;Patient limited by pain    Behavior During Therapy  [facility] for tasks assessed/performed       Past Medical History:  Diagnosis Date  . Breast cancer ([place]) 2020   Left Breast Cancer  . Cancer ([place])    left breast  . Chronic bronchitis ([place])   . Family history of breast cancer   . Family history of ovarian cancer   . Family history of prostate cancer   . GERD (gastroesophageal reflux disease)    due to chemotherapy  . History of radiation therapy 09/02/18- 10/14/18   Left Breast 25 fx for a total of 50 Gy, Left SCV, PA 25 fractions for a total of 50 Gy, Left Breast boost 5 fractions, for a total of 10 Gy.   . Lactose intolerance   . Personal history of chemotherapy 2020   Left Breast Cancer  . Personal history of radiation therapy 2020   Left Breast Cancer  . PONV (postoperative nausea and vomiting)     Past Surgical History:  Procedure Laterality Date  . BREAST LUMPECTOMY Left 07/12/2018  . BREAST LUMPECTOMY WITH RADIOACTIVE SEED AND AXILLARY LYMPH NODE DISSECTION Left 07/12/2018   Procedure: LEFT BREAST RADIOACTIVE SEED X 3 LUMPECTOMY X 3  AND AXILLARY LYMPH NODE DISSECTION;  Surgeon: Jovita Kussmaul, MD;  Location: [facility];  Service: General;  Laterality: Left;  . PORTACATH PLACEMENT Right 03/03/2018   Procedure: INSERTION  PORT-A-CATH;  Surgeon: Jovita Kussmaul, MD;  Location: [place];  Service: General;  Laterality: Right;  . ROTATOR CUFF REPAIR Right 2006  . TOTAL ABDOMINAL HYSTERECTOMY  2010    There were no vitals filed for this visit.  Subjective Assessment - 03/10/19 0808    Subjective  Pt. reporting still with difficulty with overhead lats stretch on wall.    Pertinent History  GERD, L breast CA with lumpectomy, radiation, & chemo, R RTC repair 2010    Diagnostic tests  11/02/18 L shoulder MRI: Focal high-grade partial-thickness bursal surface tear of the distal supraspinatus tendon at the insertion. No full-thicknesstear. Mild subacromial/subdeltoid bursitis. Mild AC OA.    Patient Stated Goals  "be able to scratch by back"    Currently in Pain?  Yes    Pain Score  5     Pain Location  Shoulder    Pain Orientation  Left    Pain Descriptors / Indicators  --   "stiffness"   Pain Type  Chronic pain                       [facility] Adult PT Treatment/Exercise - 03/10/19 0001      Shoulder Exercises: Supine   External Rotation  Left;10 reps;AAROM  2 sets    External Rotation Limitations  pillow under elbow    Flexion  Left;AAROM;10 reps    Flexion Limitations  wand AAROM    ABduction  Left;10 reps;AAROM    ABduction Limitations  wand AAROM       Shoulder Exercises: Pulleys   Flexion  3 minutes    Flexion Limitations  to tolerance    Scaption  3 minutes    Scaption Limitations  to tolerance      Shoulder Exercises: Stretch   Internal Rotation Stretch  --   AAROM wand behind back IR stretch    Internal Rotation Stretch Limitations  wand 5" x 10 reps behind back     Other Shoulder Stretches  L lats stretch in seated with ball rollouts and table slide 3 x 30 sec       Manual Therapy   Manual Therapy  Passive ROM    Manual therapy comments  sidelying     Soft tissue mobilization  STM to L UT, L lateral shoulder     Passive ROM  L shoulder PROM all directions               PT Education - 03/10/19 [id number]    Education Details  HEP update;  2 versions of seated lats stretch,  supine AAROM ER with wand, wand AAROM IR stretch behind back    Person(s) Educated  Patient    Methods  Explanation;Demonstration;Verbal cues;Handout    Comprehension  Verbalized understanding;Returned demonstration;Verbal cues required       PT Short Term Goals - 03/04/19 0855      PT SHORT TERM GOAL #1   Title  Patient to be indpendent with initial HEP.    Time  3    Period  Weeks    Status  On-going    Target Date  03/15/19        PT Long Term Goals - 03/04/19 0855      PT LONG TERM GOAL #1   Title  Patient to be independent with advanced HEP.    Time  6    Period  Weeks    Status  On-going      PT LONG TERM GOAL #2   Title  Patient to demonstrate L shoulder AROM WFL and without pain limiting.    Time  6    Period  Weeks    Status  On-going      PT LONG TERM GOAL #3   Title  Patient to demonstrate L shoulder strength >/=4+/5.    Time  6    Period  Weeks    Status  On-going      PT LONG TERM GOAL #4   Title  Patient to report 75% improvement in ability to reach behind her back.    Time  6    Period  Weeks    Status  On-going            Plan - 03/10/19 0834    Clinical Impression Statement  Whitley doing well today.  Notes difficulty with standing lats stretch.  Provided seated alternatives for lats stretch with pt. demonstrating understanding and confirming appropriate stretch sensation.  Tolerated all other AAROM activities well today with good understanding of avoiding excessive end ROM pain.  Added behind back AAROM wand IR stretch per pt. request to HEP.  Will monitor tolerance to updated HEP and continue to progress toward goals.  Pt. left session with pain returned to baseline.  Comorbidities  GERD, L breast CA with lumpectomy, radiation, & chemo, R RTC repair 2010    Rehab Potential  Good    PT Treatment/Interventions  ADLs/Self  Care Home Management;Cryotherapy;Moist Heat;Therapeutic exercise;Therapeutic activities;Functional mobility training;Neuromuscular re-education;Patient/family education;Manual techniques;Taping;Splinting;Energy conservation;Dry needling;Passive range of motion;Scar mobilization    PT Next Visit Plan  PROM, stretching, STM    Consulted and Agree with Plan of Care  Patient       Patient will benefit from skilled therapeutic intervention in order to improve the following deficits and impairments:  Hypomobility, Decreased activity tolerance, Decreased strength, Increased fascial restricitons, Impaired UE functional use, Pain, Improper body mechanics, Decreased range of motion, Postural dysfunction  Visit Diagnosis: Chronic left shoulder pain  Stiffness of left shoulder, not elsewhere classified  Other symptoms and signs involving the musculoskeletal system     Problem List Patient Active Problem List   Diagnosis Date Noted  . Carcinoma of upper-outer quadrant of left breast in female, estrogen receptor negative ([place]) 07/12/2018  . Seizure-like activity ([place])   . Hyponatremia   . Recurrent syncope 03/08/2018  . LFT elevation   . Seizure ([place])   . Thrombocytopenia ([place])   . Port-A-Cath in place 03/04/2018  . Genetic testing 02/23/2018  . Family history of breast cancer   . Family history of prostate cancer   . Family history of ovarian cancer   . Malignant neoplasm of left female breast ([place]) 02/03/2018  . Prediabetes 01/06/2018  . Reactive airway disease without complication [id number]  . Class 1 obesity with serious comorbidity and body mass index (BMI) of 32.0 to 32.9 in adult 12/22/2017    Bess Harvest, PTA 03/10/19 2:44 PM   [facility] [address]  [place], [place], [address] Phone: [phone]   Fax:  [phone]  Name: RAMA WEBSTER MRN: [id number] Date of Birth: 06-Oct-1955

## 2019-03-11 ENCOUNTER — Other Ambulatory Visit (HOSPITAL_COMMUNITY): Payer: 59

## 2019-03-11 ENCOUNTER — Encounter (HOSPITAL_COMMUNITY): Payer: 59 | Admitting: Internal Medicine

## 2019-03-15 ENCOUNTER — Other Ambulatory Visit: Payer: Self-pay

## 2019-03-15 ENCOUNTER — Ambulatory Visit: Payer: Managed Care, Other (non HMO)

## 2019-03-15 DIAGNOSIS — G8929 Other chronic pain: Secondary | ICD-10-CM

## 2019-03-15 DIAGNOSIS — M25512 Pain in left shoulder: Secondary | ICD-10-CM | POA: Diagnosis not present

## 2019-03-15 DIAGNOSIS — R29898 Other symptoms and signs involving the musculoskeletal system: Secondary | ICD-10-CM

## 2019-03-15 DIAGNOSIS — M25612 Stiffness of left shoulder, not elsewhere classified: Secondary | ICD-10-CM

## 2019-03-15 NOTE — Therapy (Signed)
[facility] [address]  [place], [place], [address] Phone: [phone]   Fax:  [phone]  Physical Therapy Treatment  Patient Details  Name: Christy Serrano MRN: [id number] Date of Birth: October 02, 1955 Referring Provider (PT): Christy Pel, MD   Encounter Date: 03/15/2019  PT End of Session - 03/15/19 1635    Visit Number  4    Number of Visits  13    Date for PT Re-Evaluation  04/05/19    Authorization Type  [employer]    PT Start Time  1622    PT Stop Time  1702    PT Time Calculation (min)  40 min    Activity Tolerance  Patient tolerated treatment well;Patient limited by pain    Behavior During Therapy  [facility] for tasks assessed/performed       Past Medical History:  Diagnosis Date  . Breast cancer ([place]) 2020   Left Breast Cancer  . Cancer ([place])    left breast  . Chronic bronchitis ([place])   . Family history of breast cancer   . Family history of ovarian cancer   . Family history of prostate cancer   . GERD (gastroesophageal reflux disease)    due to chemotherapy  . History of radiation therapy 09/02/18- 10/14/18   Left Breast 25 fx for a total of 50 Gy, Left SCV, PA 25 fractions for a total of 50 Gy, Left Breast boost 5 fractions, for a total of 10 Gy.   . Lactose intolerance   . Personal history of chemotherapy 2020   Left Breast Cancer  . Personal history of radiation therapy 2020   Left Breast Cancer  . PONV (postoperative nausea and vomiting)     Past Surgical History:  Procedure Laterality Date  . BREAST LUMPECTOMY Left 07/12/2018  . BREAST LUMPECTOMY WITH RADIOACTIVE SEED AND AXILLARY LYMPH NODE DISSECTION Left 07/12/2018   Procedure: LEFT BREAST RADIOACTIVE SEED X 3 LUMPECTOMY X 3  AND AXILLARY LYMPH NODE DISSECTION;  Surgeon: Christy Kussmaul, MD;  Location: [facility];  Service: General;  Laterality: Left;  . PORTACATH PLACEMENT Right 03/03/2018   Procedure: INSERTION  PORT-A-CATH;  Surgeon: Christy Kussmaul, MD;  Location: [facility];  Service: General;  Laterality: Right;  . ROTATOR CUFF REPAIR Right 2006  . TOTAL ABDOMINAL HYSTERECTOMY  2010    There were no vitals filed for this visit.  Subjective Assessment - 03/15/19 1625    Subjective  Pt. reporting mostly "stiffness" in L shoulder now.    Pertinent History  GERD, L breast CA with lumpectomy, radiation, & chemo, R RTC repair 2010    Diagnostic tests  11/02/18 L shoulder MRI: Focal high-grade partial-thickness bursal surface tear of the distal supraspinatus tendon at the insertion. No full-thicknesstear. Mild subacromial/subdeltoid bursitis. Mild AC OA.    Patient Stated Goals  "be able to scratch by back"    Currently in Pain?  Yes    Pain Score  5     Pain Location  Shoulder    Pain Orientation  Left    Pain Descriptors / Indicators  --   "stiffness"   Pain Type  Chronic pain    Pain Onset  More than a month ago    Pain Frequency  Intermittent    Aggravating Factors   unsure    Pain Relieving Factors  unsure    Multiple Pain Sites  No  [facility] PT Assessment - 03/15/19 0001      AROM   AROM Assessment Site  Shoulder    Right/Left Shoulder  Left    Left Shoulder Flexion  123 Degrees   no "pop"   Left Shoulder ABduction  125 Degrees    Left Shoulder Internal Rotation  --   FIR to 3" bellow bra strap    Left Shoulder External Rotation  --   FER to T3                  [facility] Adult PT Treatment/Exercise - 03/15/19 0001      Shoulder Exercises: Supine   External Rotation  Left;AAROM;15 reps    External Rotation Limitations  pillow under elbow    Flexion  Left;AAROM;15 reps    Flexion Limitations  wand AAROM    ABduction  Left;AAROM;15 reps    ABduction Limitations  wand AAROM     Other Supine Exercises  L shoulder AROM flexion 2# x 10 reps      Shoulder Exercises: Seated   Other Seated Exercises  Reverse shoulder rolls x 15 reps       Shoulder  Exercises: Sidelying   External Rotation  Left;10 reps;Strengthening    External Rotation Limitations  with pillow under elbow       Shoulder Exercises: Pulleys   Flexion  3 minutes    Flexion Limitations  to tolerance    Scaption  3 minutes    Scaption Limitations  to tolerance      Manual Therapy   Manual Therapy  Passive ROM    Passive ROM  L shoulder PROM all directions                PT Short Term Goals - 03/15/19 1635      PT SHORT TERM GOAL #1   Title  Patient to be indpendent with initial HEP.    Time  3    Period  Weeks    Status  Achieved    Target Date  03/15/19        PT Long Term Goals - 03/04/19 0855      PT LONG TERM GOAL #1   Title  Patient to be independent with advanced HEP.    Time  6    Period  Weeks    Status  On-going      PT LONG TERM GOAL #2   Title  Patient to demonstrate L shoulder AROM WFL and without pain limiting.    Time  6    Period  Weeks    Status  On-going      PT LONG TERM GOAL #3   Title  Patient to demonstrate L shoulder strength >/=4+/5.    Time  6    Period  Weeks    Status  On-going      PT LONG TERM GOAL #4   Title  Patient to report 75% improvement in ability to reach behind her back.    Time  6    Period  Weeks    Status  On-going            Plan - 03/15/19 1633    Clinical Impression Statement  [name] reporting she has been performing "most" of her home exercises since last visit daily.  Able to demo improved L shoulder AROM in all planes most notable into flexion, abduction AROM.  Tolerated all gentle strengthening and ROM activities without increased pain -primary complaint during session  is L shoulder "stiffness".  Ended session pain free thus modalities deferred.    Comorbidities  GERD, L breast CA with lumpectomy, radiation, & chemo, R RTC repair 2010    Rehab Potential  Good    PT Treatment/Interventions  ADLs/Self Care Home Management;Cryotherapy;Moist Heat;Therapeutic exercise;Therapeutic  activities;Functional mobility training;Neuromuscular re-education;Patient/family education;Manual techniques;Taping;Splinting;Energy conservation;Dry needling;Passive range of motion;Scar mobilization    PT Next Visit Plan  PROM, stretching, STM    Consulted and Agree with Plan of Care  Patient       Patient will benefit from skilled therapeutic intervention in order to improve the following deficits and impairments:  Hypomobility, Decreased activity tolerance, Decreased strength, Increased fascial restricitons, Impaired UE functional use, Pain, Improper body mechanics, Decreased range of motion, Postural dysfunction  Visit Diagnosis: Chronic left shoulder pain  Stiffness of left shoulder, not elsewhere classified  Other symptoms and signs involving the musculoskeletal system     Problem List Patient Active Problem List   Diagnosis Date Noted  . Carcinoma of upper-outer quadrant of left breast in female, estrogen receptor negative ([place]) 07/12/2018  . Seizure-like activity ([place])   . Hyponatremia   . Recurrent syncope 03/08/2018  . LFT elevation   . Seizure ([place])   . Thrombocytopenia ([place])   . Port-A-Cath in place 03/04/2018  . Genetic testing 02/23/2018  . Family history of breast cancer   . Family history of prostate cancer   . Family history of ovarian cancer   . Malignant neoplasm of left female breast ([place]) 02/03/2018  . Prediabetes 01/06/2018  . Reactive airway disease without complication [id number]  . Class 1 obesity with serious comorbidity and body mass index (BMI) of 32.0 to 32.9 in adult 12/22/2017    Bess Harvest, PTA 03/15/19 6:18 PM   [facility] [address]  [place], [place], [address] Phone: [phone]   Fax:  [phone]  Name: Christy Serrano MRN: [id number] Date of Birth: 1955/05/12

## 2019-03-16 ENCOUNTER — Ambulatory Visit: Payer: 59 | Admitting: Orthopedic Surgery

## 2019-03-16 ENCOUNTER — Encounter: Payer: Self-pay | Admitting: Hematology and Oncology

## 2019-03-21 ENCOUNTER — Other Ambulatory Visit: Payer: Self-pay

## 2019-03-21 ENCOUNTER — Ambulatory Visit (HOSPITAL_COMMUNITY)
Admission: RE | Admit: 2019-03-21 | Discharge: 2019-03-21 | Disposition: A | Discharge status: Home / Self Care | Payer: Managed Care, Other (non HMO) | Source: Ambulatory Visit | Attending: Internal Medicine | Admitting: Internal Medicine

## 2019-03-21 DIAGNOSIS — C50412 Malignant neoplasm of upper-outer quadrant of left female breast: Secondary | ICD-10-CM | POA: Diagnosis not present

## 2019-03-21 DIAGNOSIS — I509 Heart failure, unspecified: Secondary | ICD-10-CM | POA: Diagnosis not present

## 2019-03-21 DIAGNOSIS — Z171 Estrogen receptor negative status [ER-]: Secondary | ICD-10-CM | POA: Diagnosis not present

## 2019-03-21 NOTE — Progress Notes (Signed)
  Echocardiogram 2D Echocardiogram has been performed.  Christy Serrano 03/21/2019, 3:58 PM

## 2019-03-24 ENCOUNTER — Encounter (HOSPITAL_BASED_OUTPATIENT_CLINIC_OR_DEPARTMENT_OTHER): Payer: Self-pay | Admitting: General Surgery

## 2019-03-24 ENCOUNTER — Encounter: Payer: 59 | Admitting: Physical Therapy

## 2019-03-24 ENCOUNTER — Other Ambulatory Visit: Payer: Self-pay

## 2019-03-28 ENCOUNTER — Ambulatory Visit: Payer: Managed Care, Other (non HMO) | Attending: Orthopedic Surgery | Admitting: Physical Therapy

## 2019-03-28 ENCOUNTER — Other Ambulatory Visit: Payer: Self-pay

## 2019-03-28 ENCOUNTER — Encounter: Payer: Self-pay | Admitting: Physical Therapy

## 2019-03-28 DIAGNOSIS — G8929 Other chronic pain: Secondary | ICD-10-CM | POA: Diagnosis present

## 2019-03-28 DIAGNOSIS — M25512 Pain in left shoulder: Secondary | ICD-10-CM | POA: Insufficient documentation

## 2019-03-28 DIAGNOSIS — M25612 Stiffness of left shoulder, not elsewhere classified: Secondary | ICD-10-CM | POA: Diagnosis present

## 2019-03-28 DIAGNOSIS — R29898 Other symptoms and signs involving the musculoskeletal system: Secondary | ICD-10-CM | POA: Diagnosis present

## 2019-03-28 NOTE — Therapy (Signed)
[facility] [address]  [place], [place], [address] Phone: [phone]   Fax:  [phone]  Physical Therapy Treatment  Patient Details  Name: Christy Serrano MRN: [id number] Date of Birth: 11-05-1955 Referring Provider (PT): Meredith Pel, MD   Encounter Date: 03/28/2019  PT End of Session - 03/28/19 1751    Visit Number  5    Number of Visits  13    Date for PT Re-Evaluation  04/05/19    Authorization Type  [employer]    PT Start Time  1701    PT Stop Time  [id number]    PT Time Calculation (min)  44 min    Activity Tolerance  Patient tolerated treatment well;Patient limited by pain    Behavior During Therapy  [facility] for tasks assessed/performed       Past Medical History:  Diagnosis Date  . Breast cancer ([place]) 2020   Left Breast Cancer  . Cancer ([place])    left breast  . Chronic bronchitis ([place])   . Family history of breast cancer   . Family history of ovarian cancer   . Family history of prostate cancer   . GERD (gastroesophageal reflux disease)    due to chemotherapy  . History of radiation therapy 09/02/18- 10/14/18   Left Breast 25 fx for a total of 50 Gy, Left SCV, PA 25 fractions for a total of 50 Gy, Left Breast boost 5 fractions, for a total of 10 Gy.   . Lactose intolerance   . Personal history of chemotherapy 2020   Left Breast Cancer  . Personal history of radiation therapy 2020   Left Breast Cancer  . PONV (postoperative nausea and vomiting)     Past Surgical History:  Procedure Laterality Date  . BREAST LUMPECTOMY Left 07/12/2018  . BREAST LUMPECTOMY WITH RADIOACTIVE SEED AND AXILLARY LYMPH NODE DISSECTION Left 07/12/2018   Procedure: LEFT BREAST RADIOACTIVE SEED X 3 LUMPECTOMY X 3  AND AXILLARY LYMPH NODE DISSECTION;  Surgeon: Jovita Kussmaul, MD;  Location: [facility];  Service: General;  Laterality: Left;  . PORTACATH PLACEMENT Right 03/03/2018   Procedure: INSERTION  PORT-A-CATH;  Surgeon: Jovita Kussmaul, MD;  Location: [facility];  Service: General;  Laterality: Right;  . ROTATOR CUFF REPAIR Right 2006  . TOTAL ABDOMINAL HYSTERECTOMY  2010    There were no vitals filed for this visit.  Subjective Assessment - 03/28/19 1702    Subjective  Has been doing pretty good for the most part. Started getting a twinge in her shoulder from doing some of her HEP.    Pertinent History  GERD, L breast CA with lumpectomy, radiation, & chemo, R RTC repair 2010    Diagnostic tests  11/02/18 L shoulder MRI: Focal high-grade partial-thickness bursal surface tear of the distal supraspinatus tendon at the insertion. No full-thicknesstear. Mild subacromial/subdeltoid bursitis. Mild AC OA.    Patient Stated Goals  "be able to scratch by back"    Currently in Pain?  No/denies                   [facility] Adult PT Treatment/Exercise - 03/28/19 0001      Shoulder Exercises: Supine   External Rotation  Left;AAROM;10 reps    External Rotation Limitations  elbows by sides    Flexion  Left;AAROM;10 reps    Flexion Limitations  wand AAROM   no c/o popping   ABduction  Left;AAROM;10  reps    ABduction Limitations  scaption; wand AAROM    cues to maintain scaption; c/o pop in anterior shoulder- dis     Shoulder Exercises: Sidelying   ABduction  AROM;Left;10 reps    ABduction Limitations  thumb up      Shoulder Exercises: Pulleys   Flexion  3 minutes    Flexion Limitations  to tolerance    Scaption  3 minutes    Scaption Limitations  to tolerance      Manual Therapy   Manual Therapy  Passive ROM;Soft tissue mobilization    Manual therapy comments  supine, R sidelying    Soft tissue mobilization  STM to L lat- c/o considerable tenderness at mid length of muscle with palpable taut band of possible scar tissue wrapping around ribs    Passive ROM  L shoulder flexion and abduction with prolonged holds to tolerance while maintaining pressure over L lat              PT Education - 03/28/19 1750    Education Details  update to HEP; advised to omit shoulder abduction AAROM d/t pain & popping    Person(s) Educated  Patient    Methods  Explanation;Demonstration;Tactile cues;Verbal cues;Handout    Comprehension  Verbalized understanding;Returned demonstration       PT Short Term Goals - 03/15/19 1635      PT SHORT TERM GOAL #1   Title  Patient to be indpendent with initial HEP.    Time  3    Period  Weeks    Status  Achieved    Target Date  03/15/19        PT Long Term Goals - 03/04/19 0855      PT LONG TERM GOAL #1   Title  Patient to be independent with advanced HEP.    Time  6    Period  Weeks    Status  On-going      PT LONG TERM GOAL #2   Title  Patient to demonstrate L shoulder AROM WFL and without pain limiting.    Time  6    Period  Weeks    Status  On-going      PT LONG TERM GOAL #3   Title  Patient to demonstrate L shoulder strength >/=4+/5.    Time  6    Period  Weeks    Status  On-going      PT LONG TERM GOAL #4   Title  Patient to report 75% improvement in ability to reach behind her back.    Time  6    Period  Weeks    Status  On-going            Plan - 03/28/19 1751    Clinical Impression Statement  Patient reporting some discomfort with supine shoulder AAROM while at home. Reviewed these exercises with patient reporting uncomfortable pop in L anterior shoulder with abduction AAROM. Thus, discontinued this exercise and advised patient to omit this exercise from HEP for the time being. Patient also reporting increasing tightness in L lat in the mornings, thus addressed this with STM to L lat. Patient demonstrating palpable tender and taut band of possible scar tissue wrapping around ribs. Worked on sidelying shoulder abduction which was better tolerated than AAROM, thus updated this exercise into HEP. Patient reported understanding and with no complaints at end of session. Plan to progress per POC.     Comorbidities  GERD, L breast CA with lumpectomy,  radiation, & chemo, R RTC repair 2010    Rehab Potential  Good    PT Treatment/Interventions  ADLs/Self Care Home Management;Cryotherapy;Moist Heat;Therapeutic exercise;Therapeutic activities;Functional mobility training;Neuromuscular re-education;Patient/family education;Manual techniques;Taping;Splinting;Energy conservation;Dry needling;Passive range of motion;Scar mobilization    PT Next Visit Plan  PROM, stretching, STM    Consulted and Agree with Plan of Care  Patient       Patient will benefit from skilled therapeutic intervention in order to improve the following deficits and impairments:  Hypomobility, Decreased activity tolerance, Decreased strength, Increased fascial restricitons, Impaired UE functional use, Pain, Improper body mechanics, Decreased range of motion, Postural dysfunction  Visit Diagnosis: Chronic left shoulder pain  Stiffness of left shoulder, not elsewhere classified  Other symptoms and signs involving the musculoskeletal system     Problem List Patient Active Problem List   Diagnosis Date Noted  . Carcinoma of upper-outer quadrant of left breast in female, estrogen receptor negative ([place]) 07/12/2018  . Seizure-like activity ([place])   . Hyponatremia   . Recurrent syncope 03/08/2018  . LFT elevation   . Seizure ([place])   . Thrombocytopenia ([place])   . Port-A-Cath in place 03/04/2018  . Genetic testing 02/23/2018  . Family history of breast cancer   . Family history of prostate cancer   . Family history of ovarian cancer   . Malignant neoplasm of left female breast ([place]) 02/03/2018  . Prediabetes 01/06/2018  . Reactive airway disease without complication [id number]  . Class 1 obesity with serious comorbidity and body mass index (BMI) of 32.0 to 32.9 in adult 12/22/2017      Janene Harvey, PT, DPT 03/28/19 5:56 PM   [facility] [address]  [place], [place], [address] Phone: [phone]   Fax:  [phone]  Name: ALEXEE SPADAFORE MRN: [id number] Date of Birth: Aug 05, 1955

## 2019-03-29 ENCOUNTER — Other Ambulatory Visit (HOSPITAL_COMMUNITY)
Admission: RE | Admit: 2019-03-29 | Discharge: 2019-03-29 | Disposition: A | Discharge status: Home / Self Care | Payer: 59 | Source: Ambulatory Visit | Attending: General Surgery | Admitting: General Surgery

## 2019-03-29 ENCOUNTER — Encounter (HOSPITAL_BASED_OUTPATIENT_CLINIC_OR_DEPARTMENT_OTHER)
Admission: RE | Admit: 2019-03-29 | Discharge: 2019-03-29 | Disposition: A | Discharge status: Home / Self Care | Payer: 59 | Source: Ambulatory Visit | Attending: General Surgery | Admitting: General Surgery

## 2019-03-29 ENCOUNTER — Ambulatory Visit (INDEPENDENT_AMBULATORY_CARE_PROVIDER_SITE_OTHER): Payer: 59 | Admitting: Bariatrics

## 2019-03-29 ENCOUNTER — Encounter: Payer: Self-pay | Admitting: Adult Health

## 2019-03-29 DIAGNOSIS — Z9221 Personal history of antineoplastic chemotherapy: Secondary | ICD-10-CM | POA: Diagnosis not present

## 2019-03-29 DIAGNOSIS — Z79891 Long term (current) use of opiate analgesic: Secondary | ICD-10-CM | POA: Diagnosis not present

## 2019-03-29 DIAGNOSIS — Z452 Encounter for adjustment and management of vascular access device: Secondary | ICD-10-CM | POA: Diagnosis present

## 2019-03-29 DIAGNOSIS — Z79890 Hormone replacement therapy (postmenopausal): Secondary | ICD-10-CM | POA: Diagnosis not present

## 2019-03-29 DIAGNOSIS — Z01812 Encounter for preprocedural laboratory examination: Secondary | ICD-10-CM | POA: Insufficient documentation

## 2019-03-29 DIAGNOSIS — Z20822 Contact with and (suspected) exposure to covid-19: Secondary | ICD-10-CM | POA: Diagnosis not present

## 2019-03-29 DIAGNOSIS — Z853 Personal history of malignant neoplasm of breast: Secondary | ICD-10-CM | POA: Diagnosis not present

## 2019-03-29 DIAGNOSIS — Z923 Personal history of irradiation: Secondary | ICD-10-CM | POA: Diagnosis not present

## 2019-03-29 LAB — BASIC METABOLIC PANEL
Anion gap: 10 (ref 5–15)
BUN: 14 mg/dL (ref 8–23)
CO2: 24 mmol/L (ref 22–32)
Calcium: 9.1 mg/dL (ref 8.9–10.3)
Chloride: 104 mmol/L (ref 98–111)
Creatinine, Ser: 1.18 mg/dL — ABNORMAL HIGH (ref 0.44–1.00)
GFR calc Af Amer: 58 mL/min — ABNORMAL LOW (ref >60)
GFR calc non Af Amer: 50 mL/min — ABNORMAL LOW (ref >60)
Glucose, Bld: 77 mg/dL (ref 70–99)
Potassium: 4.5 mmol/L (ref 3.5–5.1)
Sodium: 138 mmol/L (ref 135–145)

## 2019-03-29 NOTE — Progress Notes (Signed)

## 2019-03-30 LAB — NOVEL CORONAVIRUS, NAA (HOSP ORDER, SEND-OUT TO REF LAB; TAT 18-24 HRS): SARS-CoV-2, NAA: NOT DETECTED

## 2019-03-31 NOTE — Anesthesia Preprocedure Evaluation (Addendum)
Anesthesia Evaluation  Patient identified by MRN, date of birth, ID band Patient awake    Reviewed: Allergy & Precautions, NPO status , Patient's Chart, lab work & pertinent test results  History of Anesthesia Complications (+) PONV and history of anesthetic complications  Airway Mallampati: II  TM Distance: >3 FB Neck ROM: Full    Dental  (+) Missing,    Pulmonary neg pulmonary ROS,    Pulmonary exam normal        Cardiovascular negative cardio ROS Normal cardiovascular exam     Neuro/Psych negative psych ROS   GI/Hepatic negative GI ROS, Neg liver ROS,   Endo/Other  negative endocrine ROS  Renal/GU negative Renal ROS  negative genitourinary   Musculoskeletal negative musculoskeletal ROS (+)   Abdominal   Peds  Hematology negative hematology ROS (+)   Anesthesia Other Findings Left breast ca s/p radiation/chemo  Reproductive/Obstetrics                            Anesthesia Physical Anesthesia Plan  ASA: II  Anesthesia Plan: General   Post-op Pain Management:    Induction: Intravenous  PONV Risk Score and Plan: 4 or greater and Treatment may vary due to age or medical condition, Ondansetron, Midazolam and Scopolamine patch - Pre-op  Airway Management Planned: Natural Airway and Simple Face Mask  Additional Equipment: None  Intra-op Plan:   Post-operative Plan: Extubation in OR  Informed Consent: I have reviewed the patients History and Physical, chart, labs and discussed the procedure including the risks, benefits and alternatives for the proposed anesthesia with the patient or authorized representative who has indicated his/her understanding and acceptance.     Dental advisory given  Plan Discussed with: CRNA  Anesthesia Plan Comments: (MAC vs GA discussed with patient. Pt prefers GA. Daiva Huge, MD)     Anesthesia Quick Evaluation

## 2019-04-01 ENCOUNTER — Ambulatory Visit (HOSPITAL_BASED_OUTPATIENT_CLINIC_OR_DEPARTMENT_OTHER): Payer: Managed Care, Other (non HMO) | Admitting: Anesthesiology

## 2019-04-01 ENCOUNTER — Other Ambulatory Visit: Payer: Self-pay

## 2019-04-01 ENCOUNTER — Encounter (HOSPITAL_BASED_OUTPATIENT_CLINIC_OR_DEPARTMENT_OTHER)
Admission: RE | Disposition: A | Discharge status: Home / Self Care | Payer: Self-pay | Source: Home / Self Care | Attending: General Surgery

## 2019-04-01 ENCOUNTER — Encounter (HOSPITAL_BASED_OUTPATIENT_CLINIC_OR_DEPARTMENT_OTHER): Payer: Self-pay | Admitting: General Surgery

## 2019-04-01 ENCOUNTER — Ambulatory Visit (HOSPITAL_BASED_OUTPATIENT_CLINIC_OR_DEPARTMENT_OTHER)
Admission: RE | Admit: 2019-04-01 | Discharge: 2019-04-01 | Disposition: A | Discharge status: Home / Self Care | Payer: Managed Care, Other (non HMO) | Attending: General Surgery | Admitting: General Surgery

## 2019-04-01 DIAGNOSIS — Z853 Personal history of malignant neoplasm of breast: Secondary | ICD-10-CM | POA: Insufficient documentation

## 2019-04-01 DIAGNOSIS — Z79891 Long term (current) use of opiate analgesic: Secondary | ICD-10-CM | POA: Insufficient documentation

## 2019-04-01 DIAGNOSIS — Z452 Encounter for adjustment and management of vascular access device: Secondary | ICD-10-CM | POA: Insufficient documentation

## 2019-04-01 DIAGNOSIS — Z9221 Personal history of antineoplastic chemotherapy: Secondary | ICD-10-CM | POA: Insufficient documentation

## 2019-04-01 DIAGNOSIS — Z79890 Hormone replacement therapy (postmenopausal): Secondary | ICD-10-CM | POA: Insufficient documentation

## 2019-04-01 DIAGNOSIS — Z923 Personal history of irradiation: Secondary | ICD-10-CM | POA: Insufficient documentation

## 2019-04-01 HISTORY — PX: PORT-A-CATH REMOVAL: SHX5289

## 2019-04-01 SURGERY — REMOVAL PORT-A-CATH
Anesthesia: Monitor Anesthesia Care | Site: Chest | Laterality: Right

## 2019-04-01 MED ORDER — HYDROCODONE-ACETAMINOPHEN 5-325 MG PO TABS: 1.0000 | ORAL_TABLET | Freq: Four times a day (QID) | ORAL | 0 refills | Status: DC | PRN

## 2019-04-01 MED ORDER — ACETAMINOPHEN 500 MG PO TABS
1000.0000 mg | ORAL_TABLET | Freq: Once | ORAL | Status: AC
  Administered 2019-04-01: 1000 mg via ORAL

## 2019-04-01 MED ORDER — ACETAMINOPHEN 500 MG PO TABS
ORAL_TABLET | ORAL | Status: AC
  Filled 2019-04-01: qty 2

## 2019-04-01 MED ORDER — FENTANYL CITRATE (PF) 100 MCG/2ML IJ SOLN: 50.0000 ug | INTRAMUSCULAR | Status: DC | PRN

## 2019-04-01 MED ORDER — LIDOCAINE 2% (20 MG/ML) 5 ML SYRINGE
INTRAMUSCULAR | Status: AC
  Filled 2019-04-01: qty 15

## 2019-04-01 MED ORDER — SCOPOLAMINE 1 MG/3DAYS TD PT72
MEDICATED_PATCH | TRANSDERMAL | Status: AC
  Filled 2019-04-01: qty 1

## 2019-04-01 MED ORDER — DEXAMETHASONE SODIUM PHOSPHATE 4 MG/ML IJ SOLN
INTRAMUSCULAR | Status: DC | PRN
  Administered 2019-04-01: 10 mg via INTRAVENOUS

## 2019-04-01 MED ORDER — MIDAZOLAM HCL 5 MG/5ML IJ SOLN
INTRAMUSCULAR | Status: DC | PRN
  Administered 2019-04-01: 2 mg via INTRAVENOUS

## 2019-04-01 MED ORDER — CHLORHEXIDINE GLUCONATE CLOTH 2 % EX PADS: 6.0000 | MEDICATED_PAD | Freq: Once | CUTANEOUS | Status: DC

## 2019-04-01 MED ORDER — MIDAZOLAM HCL 2 MG/2ML IJ SOLN: 1.0000 mg | INTRAMUSCULAR | Status: DC | PRN

## 2019-04-01 MED ORDER — LIDOCAINE 2% (20 MG/ML) 5 ML SYRINGE
INTRAMUSCULAR | Status: DC | PRN
  Administered 2019-04-01: 50 mg via INTRAVENOUS

## 2019-04-01 MED ORDER — BUPIVACAINE HCL (PF) 0.25 % IJ SOLN
INTRAMUSCULAR | Status: AC
  Filled 2019-04-01: qty 30

## 2019-04-01 MED ORDER — 0.9 % SODIUM CHLORIDE (POUR BTL) OPTIME
TOPICAL | Status: DC | PRN
  Administered 2019-04-01: 1000 mL

## 2019-04-01 MED ORDER — FENTANYL CITRATE (PF) 100 MCG/2ML IJ SOLN
INTRAMUSCULAR | Status: DC | PRN
  Administered 2019-04-01: 75 ug via INTRAVENOUS

## 2019-04-01 MED ORDER — MIDAZOLAM HCL 2 MG/2ML IJ SOLN
INTRAMUSCULAR | Status: AC
  Filled 2019-04-01: qty 2

## 2019-04-01 MED ORDER — BUPIVACAINE HCL (PF) 0.25 % IJ SOLN
INTRAMUSCULAR | Status: DC | PRN
  Administered 2019-04-01: 9 mL

## 2019-04-01 MED ORDER — LACTATED RINGERS IV SOLN: INTRAVENOUS | Status: DC

## 2019-04-01 MED ORDER — ONDANSETRON HCL 4 MG/2ML IJ SOLN
INTRAMUSCULAR | Status: AC
  Filled 2019-04-01: qty 2

## 2019-04-01 MED ORDER — PROPOFOL 500 MG/50ML IV EMUL
INTRAVENOUS | Status: AC
  Filled 2019-04-01: qty 50

## 2019-04-01 MED ORDER — FENTANYL CITRATE (PF) 100 MCG/2ML IJ SOLN
INTRAMUSCULAR | Status: AC
  Filled 2019-04-01: qty 2

## 2019-04-01 MED ORDER — ONDANSETRON HCL 4 MG/2ML IJ SOLN
INTRAMUSCULAR | Status: DC | PRN
  Administered 2019-04-01: 4 mg via INTRAVENOUS

## 2019-04-01 MED ORDER — PROPOFOL 10 MG/ML IV BOLUS
INTRAVENOUS | Status: AC
  Filled 2019-04-01: qty 20

## 2019-04-01 MED ORDER — PROPOFOL 10 MG/ML IV BOLUS
INTRAVENOUS | Status: DC | PRN
  Administered 2019-04-01: 150 mg via INTRAVENOUS

## 2019-04-01 MED FILL — HYDROCODON-APAP 5-325: 5-325 | 3 days supply | Qty: 10 | Fill #0

## 2019-04-01 SURGICAL SUPPLY — 29 items
ADH SKN CLS APL DERMABOND .7 (GAUZE/BANDAGES/DRESSINGS) ×1
APL PRP STRL LF DISP 70% ISPRP (MISCELLANEOUS) ×1
BLADE SURG 15 STRL LF DISP TIS (BLADE) ×1 IMPLANT
BLADE SURG 15 STRL SS (BLADE) ×3
CHLORAPREP W/TINT 26 (MISCELLANEOUS) ×3 IMPLANT
COVER BACK TABLE 60X90IN (DRAPES) ×3 IMPLANT
COVER MAYO STAND STRL (DRAPES) ×3 IMPLANT
COVER WAND RF STERILE (DRAPES) IMPLANT
DECANTER SPIKE VIAL GLASS SM (MISCELLANEOUS) ×3 IMPLANT
DERMABOND ADVANCED (GAUZE/BANDAGES/DRESSINGS) ×2
DERMABOND ADVANCED .7 DNX12 (GAUZE/BANDAGES/DRESSINGS) ×1 IMPLANT
DRAPE LAPAROTOMY 100X72 PEDS (DRAPES) ×3 IMPLANT
DRAPE UTILITY XL STRL (DRAPES) ×3 IMPLANT
ELECT COATED BLADE 2.86 ST (ELECTRODE) IMPLANT
ELECT REM PT RETURN 9FT ADLT (ELECTROSURGICAL)
ELECTRODE REM PT RTRN 9FT ADLT (ELECTROSURGICAL) IMPLANT
GLOVE BIO SURGEON STRL SZ7.5 (GLOVE) ×3 IMPLANT
GOWN STRL REUS W/ TWL LRG LVL3 (GOWN DISPOSABLE) ×2 IMPLANT
GOWN STRL REUS W/TWL LRG LVL3 (GOWN DISPOSABLE) ×6
NDL HYPO 25X1 1.5 SAFETY (NEEDLE) ×1 IMPLANT
NEEDLE HYPO 25X1 1.5 SAFETY (NEEDLE) ×3 IMPLANT
PACK BASIN DAY SURGERY FS (CUSTOM PROCEDURE TRAY) ×3 IMPLANT
PENCIL SMOKE EVACUATOR (MISCELLANEOUS) IMPLANT
SLEEVE SCD COMPRESS KNEE MED (MISCELLANEOUS) IMPLANT
SUT MON AB 4-0 PC3 18 (SUTURE) ×3 IMPLANT
SUT VIC AB 3-0 SH 27 (SUTURE) ×3
SUT VIC AB 3-0 SH 27X BRD (SUTURE) ×1 IMPLANT
SYR CONTROL 10ML LL (SYRINGE) ×3 IMPLANT
TOWEL GREEN STERILE FF (TOWEL DISPOSABLE) ×3 IMPLANT

## 2019-04-01 NOTE — H&P (Signed)
Mackie Pai  Location: [facility] Patient #: [id number] DOB: May 23, 1955 Single / Language: Cleophus Molt / Race: Black or African American Female   History of Present Illness  The patient is a 64 year old female who presents for a follow-up for Breast cancer. The patient is a 64 year old white female who is about 4 months status post left breast lumpectomy 3 and axillary lymph node dissection for a T3 N2 A left breast cancer that was ER/PR negative and HER-2 positive with a Ki-67 of 30%. She was treated with neoadjuvant chemotherapy. She is continuing to receive Herceptin and Perjeta and seems to be tolerating it well. She also finished radiation therapy since her last visit. She does complain of some soreness in the left breast. She also has a clicking in her left shoulder and is getting an MRI and seen an orthopedic surgeon for this.   Allergies  Amoxicillin *PENICILLINS*  Percocet *ANALGESICS - OPIOID*  Gabapentin *ANTICONVULSANTS*  Allergies Reconciled   Medication History Estradiol (0.'075MG'$ /24HR Patch TW, Transdermal) Active. Ventolin HFA (108 (90 Base)MCG/ACT Aerosol Soln, Inhalation) Active. Calcium 1200 (1200-'1000MG'$ -UNIT Tablet Chewable, Oral) Active. Vitamin D3 (Oral) Specific strength unknown - Active. Multi Vitamin (Oral) Active. HYDROcodone-Acetaminophen (10-'300MG'$  Tablet, Oral) Active. Medications Reconciled    Review of Systems  General Present- Weight Loss. Not Present- Appetite Loss, Chills, Fatigue, Fever, Night Sweats and Weight Gain. Skin Not Present- Change in Wart/Mole, Dryness, Hives, Jaundice, New Lesions, Non-Healing Wounds, Rash and Ulcer. HEENT Present- Wears glasses/contact lenses. Not Present- Earache, Hearing Loss, Hoarseness, Nose Bleed, Oral Ulcers, Ringing in the Ears, Seasonal Allergies, Sinus Pain, Sore Throat, Visual Disturbances and Yellow Eyes. Respiratory Not Present- Bloody sputum, Chronic Cough, Difficulty Breathing,  Snoring and Wheezing. Breast Not Present- Breast Mass, Breast Pain, Nipple Discharge and Skin Changes. Cardiovascular Not Present- Chest Pain, Difficulty Breathing Lying Down, Leg Cramps, Palpitations, Rapid Heart Rate, Shortness of Breath and Swelling of Extremities. Gastrointestinal Not Present- Abdominal Pain, Bloating, Bloody Stool, Change in Bowel Habits, Chronic diarrhea, Constipation, Difficulty Swallowing, Excessive gas, Gets full quickly at meals, Hemorrhoids, Indigestion, Nausea, Rectal Pain and Vomiting. Musculoskeletal Not Present- Back Pain, Joint Pain, Joint Stiffness, Muscle Pain, Muscle Weakness and Swelling of Extremities. Neurological Not Present- Decreased Memory, Fainting, Headaches, Numbness, Seizures, Tingling, Tremor, Trouble walking and Weakness. Psychiatric Not Present- Anxiety, Bipolar, Change in Sleep Pattern, Depression, Fearful and Frequent crying. Endocrine Not Present- Cold Intolerance, Excessive Hunger, Hair Changes, Heat Intolerance and New Diabetes. Hematology Present- Blood Thinners. Not Present- Easy Bruising, Excessive bleeding, Gland problems, HIV and Persistent Infections.  Vitals  Weight: 171.6 lb Height: 65.5in Body Surface Area: 1.86 m Body Mass Index: 28.12 kg/m  Temp.: 24F  Pulse: 76 (Regular)  BP: 136/82 (Sitting, Left Arm, Standard)       Physical Exam  General Mental Status-Alert. General Appearance-Consistent with stated age. Hydration-Well hydrated. Voice-Normal.  Head and Neck Head-normocephalic, atraumatic with no lesions or palpable masses. Trachea-midline. Thyroid Gland Characteristics - normal size and consistency.  Eye Eyeball - Bilateral-Extraocular movements intact. Sclera/Conjunctiva - Bilateral-No scleral icterus.  Chest and Lung Exam Chest and lung exam reveals -quiet, even and easy respiratory effort with no use of accessory muscles and on auscultation, normal breath sounds, no  adventitious sounds and normal vocal resonance. Inspection Chest Wall - Normal. Back - normal.  Breast Note: There is no palpable mass in either breast. There is some thickness of the left breast compared to the right likely secondary to radiation and radiation of her upper outer quadrant scar.  There is no palpable axillary, supraclavicular, or cervical lymphadenopathy. There is no swelling of the left arm   Cardiovascular Cardiovascular examination reveals -normal heart sounds, regular rate and rhythm with no murmurs and normal pedal pulses bilaterally.  Abdomen Inspection Inspection of the abdomen reveals - No Hernias. Skin - Scar - no surgical scars. Palpation/Percussion Palpation and Percussion of the abdomen reveal - Soft, Non Tender, No Rebound tenderness, No Rigidity (guarding) and No hepatosplenomegaly. Auscultation Auscultation of the abdomen reveals - Bowel sounds normal.  Neurologic Neurologic evaluation reveals -alert and oriented x 3 with no impairment of recent or remote memory. Mental Status-Normal.  Musculoskeletal Normal Exam - Left-Upper Extremity Strength Normal and Lower Extremity Strength Normal. Normal Exam - Right-Upper Extremity Strength Normal and Lower Extremity Strength Normal.  Lymphatic Head & Neck  General Head & Neck Lymphatics: Bilateral - Description - Normal. Axillary  General Axillary Region: Bilateral - Description - Normal. Tenderness - Non Tender. Femoral & Inguinal  Generalized Femoral & Inguinal Lymphatics: Bilateral - Description - Normal. Tenderness - Non Tender.    Assessment & Plan  MALIGNANT NEOPLASM OF UPPER-OUTER QUADRANT OF LEFT BREAST IN FEMALE, ESTROGEN RECEPTOR NEGATIVE (C50.412) Impression: The patient is about 4 months status post left breast lumpectomy and axillary lymph node dissection for breast cancer. She was treated with neoadjuvant chemotherapy and adjuvant radiation. She is still getting Herceptin. At  this point she will continue to do regular self exams. She will continue with her therapy. I will plan to see her back in about 6 months. If she needs her port removed prior to her next visit I have discussed with her the risks and benefits as well as some of the technical aspects of port removal and she understands. Current Plans Follow up with [place] in the office in 6 MONTHS.   Call us sooner as needed.

## 2019-04-01 NOTE — Op Note (Signed)
04/01/2019  9:01 AM  PATIENT:  Christy Serrano  64 y.o. female  PRE-OPERATIVE DIAGNOSIS:  LEFT BREAST CANCER  POST-OPERATIVE DIAGNOSIS:  LEFT BREAST CANCER  PROCEDURE:  Procedure(s): REMOVAL PORT-A-CATH (Right)  SURGEON:  Surgeon(s) and Role:    * Jovita Kussmaul, MD - Primary  PHYSICIAN ASSISTANT:   ASSISTANTS: none   ANESTHESIA:   local and general  EBL:  10 mL   BLOOD ADMINISTERED:none  DRAINS: none   LOCAL MEDICATIONS USED:  MARCAINE     SPECIMEN:  No Specimen  DISPOSITION OF SPECIMEN:  N/A  COUNTS:  YES  TOURNIQUET:  * No tourniquets in log *  DICTATION: .Dragon Dictation   After informed consent was obtained the patient was brought to the operating room and placed in the supine position on the operating table.  After adequate induction of general anesthesia the patient's right chest wall was prepped with ChloraPrep, allowed to dry, and draped in usual sterile manner.  An appropriate timeout was performed.  The area around the port was then infiltrated with quarter percent Marcaine.  A small incision was made through her previous incision with a 15 blade knife.  The incision was carried through the subcutaneous tissue sharply with a 15 blade knife until the capsule surrounding the port was opened.  The 2 anchoring stitches were divided and removed.  The port was then gently pushed out of its pocket and with gentle traction was removed from the patient without difficulty.  Pressure was held on the area for several minutes until it was completely hemostatic.  The tract and deep layer of the wound were then closed with interrupted 3-0 Vicryl stitches.  The skin was then closed with a running 4-0 Monocryl subcuticular stitch.  Dermabond dressings were applied.  The patient tolerated the procedure well.  At the end of the case all needle sponge and instrument counts were correct.  The patient was then awakened and taken to recovery in stable condition.  PLAN OF CARE: Discharge  to home after PACU  PATIENT DISPOSITION:  PACU - hemodynamically stable.   Delay start of Pharmacological VTE agent (>24hrs) due to surgical blood loss or risk of bleeding: not applicable

## 2019-04-01 NOTE — Discharge Instructions (Signed)
No Tylenol until 2pm    Post Anesthesia Home Care Instructions  Activity: Get plenty of rest for the remainder of the day. A responsible individual must stay with you for 24 hours following the procedure.  For the next 24 hours, DO NOT: -Drive a car -Paediatric nurse -Drink alcoholic beverages -Take any medication unless instructed by your physician -Make any legal decisions or sign important papers.  Meals: Start with liquid foods such as gelatin or soup. Progress to regular foods as tolerated. Avoid greasy, spicy, heavy foods. If nausea and/or vomiting occur, drink only clear liquids until the nausea and/or vomiting subsides. Call your physician if vomiting continues.  Special Instructions/Symptoms: Your throat may feel dry or sore from the anesthesia or the breathing tube placed in your throat during surgery. If this causes discomfort, gargle with warm salt water. The discomfort should disappear within 24 hours.  If you had a scopolamine patch placed behind your ear for the management of post- operative nausea and/or vomiting:  1. The medication in the patch is effective for 72 hours, after which it should be removed.  Wrap patch in a tissue and discard in the trash. Wash hands thoroughly with soap and water. 2. You may remove the patch earlier than 72 hours if you experience unpleasant side effects which may include dry mouth, dizziness or visual disturbances. 3. Avoid touching the patch. Wash your hands with soap and water after contact with the patch.

## 2019-04-01 NOTE — Anesthesia Procedure Notes (Signed)
Procedure Name: LMA Insertion Date/Time: 04/01/2019 8:38 AM Performed by: Marrianne Mood, CRNA Pre-anesthesia Checklist: Patient identified, Emergency Drugs available, Suction available, Patient being monitored and Timeout performed Patient Re-evaluated:Patient Re-evaluated prior to induction Oxygen Delivery Method: Circle system utilized Preoxygenation: Pre-oxygenation with 100% oxygen Induction Type: IV induction Ventilation: Mask ventilation without difficulty LMA: LMA inserted LMA Size: 4.0 Number of attempts: 1 Airway Equipment and Method: Bite block Placement Confirmation: positive ETCO2 Tube secured with: Tape Dental Injury: Teeth and Oropharynx as per pre-operative assessment

## 2019-04-01 NOTE — Interval H&P Note (Signed)
History and Physical Interval Note:  04/01/2019 8:25 AM  Christy Serrano  has presented today for surgery, with the diagnosis of LEFT BREAST CANCER.  The various methods of treatment have been discussed with the patient and family. After consideration of risks, benefits and other options for treatment, the patient has consented to  Procedure(s): REMOVAL PORT-A-CATH (N/A) as a surgical intervention.  The patient's history has been reviewed, patient examined, no change in status, stable for surgery.  I have reviewed the patient's chart and labs.  Questions were answered to the patient's satisfaction.     [name]

## 2019-04-01 NOTE — Anesthesia Postprocedure Evaluation (Signed)
Anesthesia Post Note  Patient: PAETYNN FEURTADO  Procedure(s) Performed: REMOVAL PORT-A-CATH (Right Chest)     Patient location during evaluation: PACU Anesthesia Type: General Level of consciousness: awake and alert and oriented Pain management: pain level controlled Vital Signs Assessment: post-procedure vital signs reviewed and stable Respiratory status: spontaneous breathing, nonlabored ventilation and respiratory function stable Cardiovascular status: blood pressure returned to baseline Postop Assessment: no apparent nausea or vomiting Anesthetic complications: no    Last Vitals:  Vitals:   04/01/19 0915 04/01/19 0930  BP: 130/70 126/72  Pulse: 71 70  Resp: 14 13  Temp:    SpO2: 100% 100%    Last Pain:  Vitals:   04/01/19 0930  TempSrc:   PainSc: 0-No pain                 Brennan Bailey

## 2019-04-01 NOTE — Transfer of Care (Signed)
Immediate Anesthesia Transfer of Care Note  Patient: LEONTYNE BOVA  Procedure(s) Performed: REMOVAL PORT-A-CATH (Right Chest)  Patient Location: PACU  Anesthesia Type:General  Level of Consciousness: awake and patient cooperative  Airway & Oxygen Therapy: Patient Spontanous Breathing and Patient connected to face mask oxygen  Post-op Assessment: Report given to RN and Post -op Vital signs reviewed and stable  Post vital signs: Reviewed and stable  Last Vitals:  Vitals Value Taken Time  BP 133/85 04/01/19 0909  Temp    Pulse 70 04/01/19 0910  Resp 11 04/01/19 0910  SpO2 100 % 04/01/19 0910  Vitals shown include unvalidated device data.  Last Pain:  Vitals:   04/01/19 0753  TempSrc: Temporal  PainSc: 0-No pain         Complications: No apparent anesthesia complications

## 2019-04-04 ENCOUNTER — Encounter: Payer: Self-pay | Admitting: *Deleted

## 2019-04-06 ENCOUNTER — Ambulatory Visit (INDEPENDENT_AMBULATORY_CARE_PROVIDER_SITE_OTHER): Payer: 59 | Admitting: Orthopedic Surgery

## 2019-04-06 ENCOUNTER — Encounter (HOSPITAL_COMMUNITY): Payer: Self-pay

## 2019-04-06 ENCOUNTER — Other Ambulatory Visit: Payer: Self-pay

## 2019-04-06 ENCOUNTER — Encounter: Payer: Self-pay | Admitting: Orthopedic Surgery

## 2019-04-06 ENCOUNTER — Ambulatory Visit (HOSPITAL_COMMUNITY)
Admission: RE | Admit: 2019-04-06 | Discharge: 2019-04-06 | Disposition: A | Discharge status: Home / Self Care | Payer: 59 | Source: Ambulatory Visit | Attending: Internal Medicine | Admitting: Internal Medicine

## 2019-04-06 DIAGNOSIS — M7502 Adhesive capsulitis of left shoulder: Secondary | ICD-10-CM

## 2019-04-06 DIAGNOSIS — I10 Essential (primary) hypertension: Secondary | ICD-10-CM | POA: Diagnosis not present

## 2019-04-06 DIAGNOSIS — C50412 Malignant neoplasm of upper-outer quadrant of left female breast: Secondary | ICD-10-CM

## 2019-04-06 DIAGNOSIS — R600 Localized edema: Secondary | ICD-10-CM

## 2019-04-06 DIAGNOSIS — M75112 Incomplete rotator cuff tear or rupture of left shoulder, not specified as traumatic: Secondary | ICD-10-CM

## 2019-04-06 NOTE — Addendum Note (Signed)
Encounter addended by: Valeda Malm, RN on: 04/06/2019 10:42 AM  Actions taken: Clinical Note Signed

## 2019-04-06 NOTE — Progress Notes (Signed)
Heart Failure TeleHealth Note  Due to national recommendations of social distancing due to [place] 19, Audio/video telehealth visit is felt to be most appropriate for this patient at this time.  See MyChart message from today for patient consent regarding telehealth for [facility].  Date:  04/06/2019   ID:  Christy Serrano, DOB 1955/07/14, MRN [id number]  Location: Home  Provider location: [facility] Type of Visit: Established patient  PCP:  Christy Hatchet, MD  Cardiologist:  No primary care provider on file. Primary HF: Christy Serrano  Chief Complaint: Breast CA- cardio-oncology f/u   History of Present Illness:  Christy Serrano is 64 y.o. female with left breast cancer referred by Dr. Lindi Serrano for enrollment into the Cardio-Oncology program.   SUMMARY OF ONCOLOGIC HISTORY:       Malignant neoplasm of left female breast ([place])   01/18/2018 Initial Diagnosis    Screening mammogram detected microcalcifications and asymmetry, left breast segmental microcalcifications UOQ 1.5 x 4.3 x 5.4 cm biopsy-proven IDC with DCIS with LV I, grade 3, ER 0%, PR 0%, Ki-67 30%, HER-2 +3+ by IHC; hypoechoic mass 2 o'clock position 1.2 x 1.3 x 1.7 cm biopsy IDC, LV I present, grade 2-3, ER 0%, PR 0%, Ki-67 40%, HER-2 +3+, left axillary lymph node positive (3 nodes noted by [place]) T3 N1 stage IIIA    02/03/2018 Cancer Staging    Staging form: Breast, AJCC 8th Edition - Clinical: Stage IIIA (cT3, cN1, cM0, G3, ER-, PR-, HER2+) - Signed by Christy Lose, MD on 02/03/2018    02/20/2018 Genetic Testing    No pathogenic variants identified on the [employer] Expanded + RNA insight panel. Genes Analyzed (67 total): AIP, ALK, APC*, ATM*, BAP1, BARD1, BLM, BMPR1A, BRCA1*, BRCA2*, BRIP1*, CDH1*, CDK4, CDKN1B, CDKN2A, CHEK2*, DICER1, FANCC, FH, FLCN, GALNT12, HOXB13, MAX, MEN1, MET, MLH1*, MRE11A, MSH2*, MSH6*, MUTYH*, NBN, NF1*, NF2, PALB2*, PHOX2B, PMS2*, POLD1, POLE,  POT1, PRKAR1A, PTCH1, PTEN*, RAD50, RAD51C*, RAD51D*, RB1, RET, SDHA, SDHAF2, SDHB, SDHC, SDHD, SMAD4, SMARCA4, SMARCB1, SMARCE1, STK11, SUFU, TMEM127, TP53*, TSC1, TSC2, VHL and XRCC2 (sequencing and deletion/duplication); MITF (sequencing only); EPCAM and GREM1 (deletion/duplication only). DNA and RNA analyses performed for * genes. The report date is 02/20/2018.    03/04/2018 -  Neo-Adjuvant Chemotherapy    Neoadjuvant chemotherapy with [facility] Perjeta    03/08/2018 - 03/10/2018 Hospital Admission    Admitted for recurrent syncope with loss of consciousness for up to 5 minutes with loss of bladder, no abnormalities identified. MRI brain, carotid ultrasound, EEG studies were negative.     Finished chemo 06/17/18. Completed XRT. Finished herceptin/perjeta on 02/25/19. Port now out. Feels good. No SOB. LE edema resolved on spiro.   Echo 03/21/19: EF 60-65% GLS -23.8% Personally reviewed  Christy Serrano denies symptoms worrisome for COVID 19.   Cardiac studies:  Echo 03/02/18  LVEF of 60-65%She presents via audio/video conferencing for a telehealth visit today.     Echo 3/20  EF 60-65% Grade I DD GLS -23.1%   Echo 6/20 EF 60-65% GLS -25.9% Personally reviewed  Echo 9/20 EF 55-60% GLS -17.7% (likely underestimated) Grade I DD Personally reviewed   Past Medical History:  Diagnosis Date  . Breast cancer ([place]) 2020   Left Breast Cancer  . Cancer ([place])    left breast  . Chronic bronchitis ([place])   . Family history of breast cancer   . Family history of ovarian cancer   . Family history of prostate cancer   .  History of radiation therapy 09/02/18- 10/14/18   Left Breast 25 fx for a total of 50 Gy, Left SCV, PA 25 fractions for a total of 50 Gy, Left Breast boost 5 fractions, for a total of 10 Gy.   . Lactose intolerance   . Personal history of chemotherapy 2020   Left Breast Cancer  . Personal history of radiation therapy 2020   Left Breast Cancer  . PONV (postoperative  nausea and vomiting)    Past Surgical History:  Procedure Laterality Date  . BREAST LUMPECTOMY Left 07/12/2018  . BREAST LUMPECTOMY WITH RADIOACTIVE SEED AND AXILLARY LYMPH NODE DISSECTION Left 07/12/2018   Procedure: LEFT BREAST RADIOACTIVE SEED X 3 LUMPECTOMY X 3  AND AXILLARY LYMPH NODE DISSECTION;  Surgeon: Christy Kussmaul, MD;  Location: [place];  Service: General;  Laterality: Left;  . PORT-A-CATH REMOVAL Right 04/01/2019   Procedure: REMOVAL PORT-A-CATH;  Surgeon: Christy Kussmaul, MD;  Location: [place] [facility];  Service: General;  Laterality: Right;  . PORTACATH PLACEMENT Right 03/03/2018   Procedure: INSERTION PORT-A-CATH;  Surgeon: Christy Kussmaul, MD;  Location: [place];  Service: General;  Laterality: Right;  . ROTATOR CUFF REPAIR Right 2006  . TOTAL ABDOMINAL HYSTERECTOMY  2010     Current Outpatient Medications  Medication Sig Dispense Refill  . acetaminophen (TYLENOL) 500 MG tablet Take 1,000 mg by mouth every 6 (six) hours as needed for mild pain.    Marland Kitchen albuterol (PROVENTIL HFA;VENTOLIN HFA) 108 (90 Base) MCG/ACT inhaler Inhale into the lungs every 6 (six) hours as needed for wheezing or shortness of breath.    . budesonide-formoterol (SYMBICORT) 160-4.5 MCG/ACT inhaler Inhale 2 puffs into the lungs 2 (two) times daily.    . Calcium-Phosphorus-Vitamin D (CITRACAL +D3 PO) Take 1,200 mcg by mouth.    . cholecalciferol (VITAMIN D) 1000 units tablet Take 1,000 Units by mouth daily.    Marland Kitchen estradiol (VIVELLE-DOT) 0.05 MG/24HR patch     . gabapentin (NEURONTIN) 100 MG capsule Take 2 capsules (200 mg total) by mouth daily. 90 capsule 3  . HYDROcodone-acetaminophen (NORCO/VICODIN) 5-325 MG tablet Take 1 tablet by mouth every 6 (six) hours as needed for moderate pain or severe pain. 10 tablet 0  . methocarbamol (ROBAXIN) 500 MG tablet 1 po q 8 hrs prn 30 tablet 0  . Multiple Vitamin (MULTIVITAMIN) capsule Take 1 capsule by mouth daily.    Marland Kitchen  spironolactone (ALDACTONE) 25 MG tablet Take 0.5 tablets (12.5 mg total) by mouth daily. 45 tablet 3   No current facility-administered medications for this encounter.    Allergies:   Gabapentin, Amoxicillin, and Oxycodone   Social History:  The patient  reports that she has never smoked. She has never used smokeless tobacco. She reports that she does not drink alcohol or use drugs.   Family History:  The patient's family history includes Breast cancer in her paternal aunt; Breast cancer (age of onset: 59) in her sister; Breast cancer (age of onset: 65) in her sister; Cancer in her paternal grandmother and paternal uncle; Heart attack in her mother; High blood pressure in her father and mother; Ovarian cancer in her maternal grandmother; Prostate cancer in her father.   ROS:  Please see the history of present illness.   All other systems are personally reviewed and negative.   Exam:  (Video/Tele Health Call; Exam is subjective and or/visual.) General:  Speaks in full sentences. No resp difficulty. Lungs: Normal respiratory effort with conversation.  Abdomen: Non-distended per patient report Extremities: Pt denies edema. Neuro: Alert & oriented x 3.   Recent Labs: 02/25/2019: ALT 19; Hemoglobin 10.3; Platelet Count 168 03/29/2019: BUN 14; Creatinine, Ser 1.18; Potassium 4.5; Sodium 138  Personally reviewed   Wt Readings from Last 3 Encounters:  04/01/19 80.5 kg (177 lb 7.5 oz)  02/23/19 78.5 kg (173 lb)  02/04/19 79.2 kg (174 lb 8 oz)      ASSESSMENT AND PLAN:  1. Left Breast Cancer -I reviewed echos personally. EF and Doppler parameters stable. No HF on exam. Has completed herceptin and perjeta. Can f/u PRN basis    2. HTN  - Improved with spiro. SBP 120-130 - f/u PCP   3. LE Edema - resolved with spiro. Continue  COVID screen The patient does not have any symptoms that suggest any further testing/ screening at this time.  Social distancing reinforced  today.  Recommended follow-up:  As above  Relevant cardiac medications were reviewed at length with the patient today.   The patient does not have concerns regarding their medications at this time.   The following changes were made today:  As above  Today, I have spent 14 minutes with the patient with telehealth technology discussing the above issues .    Signed, Glori Bickers, MD  04/06/2019 10:08 AM  [facility] [place] [phone] (office) [phone] (fax)

## 2019-04-06 NOTE — Progress Notes (Signed)
AVS sent via mychart.

## 2019-04-06 NOTE — Patient Instructions (Signed)
No medication changes today!!   Your physician recommends that you schedule a follow-up appointment in: CONGRATULATIONS, you will follow up with [place] as needed.     Please call office at [phone] option 2 if you have any questions or concerns.

## 2019-04-07 ENCOUNTER — Ambulatory Visit: Payer: Managed Care, Other (non HMO)

## 2019-04-07 DIAGNOSIS — R29898 Other symptoms and signs involving the musculoskeletal system: Secondary | ICD-10-CM

## 2019-04-07 DIAGNOSIS — M25512 Pain in left shoulder: Secondary | ICD-10-CM | POA: Diagnosis not present

## 2019-04-07 DIAGNOSIS — G8929 Other chronic pain: Secondary | ICD-10-CM

## 2019-04-07 DIAGNOSIS — M25612 Stiffness of left shoulder, not elsewhere classified: Secondary | ICD-10-CM

## 2019-04-07 NOTE — Therapy (Signed)
[facility] [address]  [place], [place], [address] Phone: [phone]   Fax:  [phone]  Physical Therapy Treatment  Patient Details  Name: Christy Serrano MRN: [id number] Date of Birth: 07-Jul-1955 Referring Provider (PT): Meredith Pel, MD   Encounter Date: 04/07/2019  PT End of Session - 04/07/19 1705    Visit Number  6    Number of Visits  13    Date for PT Re-Evaluation  04/05/19    Authorization Type  [employer]    PT Start Time  1700    PT Stop Time  1738    PT Time Calculation (min)  38 min    Activity Tolerance  Patient tolerated treatment well;Patient limited by pain    Behavior During Therapy  [facility] for tasks assessed/performed       Past Medical History:  Diagnosis Date  . Breast cancer ([place]) 2020   Left Breast Cancer  . Cancer ([place])    left breast  . Chronic bronchitis ([place])   . Family history of breast cancer   . Family history of ovarian cancer   . Family history of prostate cancer   . History of radiation therapy 09/02/18- 10/14/18   Left Breast 25 fx for a total of 50 Gy, Left SCV, PA 25 fractions for a total of 50 Gy, Left Breast boost 5 fractions, for a total of 10 Gy.   . Lactose intolerance   . Personal history of chemotherapy 2020   Left Breast Cancer  . Personal history of radiation therapy 2020   Left Breast Cancer  . PONV (postoperative nausea and vomiting)     Past Surgical History:  Procedure Laterality Date  . BREAST LUMPECTOMY Left 07/12/2018  . BREAST LUMPECTOMY WITH RADIOACTIVE SEED AND AXILLARY LYMPH NODE DISSECTION Left 07/12/2018   Procedure: LEFT BREAST RADIOACTIVE SEED X 3 LUMPECTOMY X 3  AND AXILLARY LYMPH NODE DISSECTION;  Surgeon: Jovita Kussmaul, MD;  Location: [place];  Service: General;  Laterality: Left;  . PORT-A-CATH REMOVAL Right 04/01/2019   Procedure: REMOVAL PORT-A-CATH;  Surgeon: Jovita Kussmaul, MD;  Location: [place];   Service: General;  Laterality: Right;  . PORTACATH PLACEMENT Right 03/03/2018   Procedure: INSERTION PORT-A-CATH;  Surgeon: Jovita Kussmaul, MD;  Location: [place];  Service: General;  Laterality: Right;  . ROTATOR CUFF REPAIR Right 2006  . TOTAL ABDOMINAL HYSTERECTOMY  2010    There were no vitals filed for this visit.  Subjective Assessment - 04/07/19 1714    Subjective  Pt. reporting she saw MD yesterday and MD pleased with her ROM progress.    Pertinent History  GERD, L breast CA with lumpectomy, radiation, & chemo, R RTC repair 2010    Diagnostic tests  11/02/18 L shoulder MRI: Focal high-grade partial-thickness bursal surface tear of the distal supraspinatus tendon at the insertion. No full-thicknesstear. Mild subacromial/subdeltoid bursitis. Mild AC OA.    Patient Stated Goals  "be able to scratch by back"    Currently in Pain?  No/denies    Pain Score  0-No pain    Pain Orientation  Left    Pain Type  Chronic pain    Pain Frequency  Intermittent    Aggravating Factors   Unsure    Pain Relieving Factors  Unsure    Multiple Pain Sites  No         [facility] PT Assessment - 04/07/19  0001      AROM   AROM Assessment Site  Shoulder    Right/Left Shoulder  Left    Left Shoulder Flexion  132 Degrees    Left Shoulder ABduction  140 Degrees    Left Shoulder Internal Rotation  --   FIR to 3" below strap    Left Shoulder External Rotation  --   FER to T2     PROM   PROM Assessment Site  Shoulder    Right/Left Shoulder  Left    Left Shoulder Flexion  150 Degrees    Left Shoulder ABduction  140 Degrees    Left Shoulder Internal Rotation  70 Degrees    Left Shoulder External Rotation  68 Degrees                  [facility] Adult PT Treatment/Exercise - 04/07/19 0001      Shoulder Exercises: Supine   External Rotation  Left;AAROM;10 reps    External Rotation Limitations  pillow under elbow for elevation to scapular plane    wand    Flexion   Left;AAROM;10 reps    Flexion Limitations  wand AAROM   5" hold      Shoulder Exercises: Sidelying   ABduction  AROM;15 reps    ABduction Limitations  thumb up      Shoulder Exercises: Pulleys   Flexion  3 minutes    Flexion Limitations  to tolerance    Scaption  3 minutes    Scaption Limitations  to tolerance      Shoulder Exercises: Stretch   Internal Rotation Stretch Limitations  wand 5" x 10 reps behind back                PT Short Term Goals - 03/15/19 1635      PT SHORT TERM GOAL #1   Title  Patient to be indpendent with initial HEP.    Time  3    Period  Weeks    Status  Achieved    Target Date  03/15/19        PT Long Term Goals - 04/07/19 1737      PT LONG TERM GOAL #1   Title  Patient to be independent with advanced HEP.    Time  6    Period  Weeks    Status  Partially Met      PT LONG TERM GOAL #2   Title  Patient to demonstrate L shoulder AROM WFL and without pain limiting.    Time  6    Period  Weeks    Status  On-going   04/07/19:  met for AROM functional ER     PT LONG TERM GOAL #3   Title  Patient to demonstrate L shoulder strength >/=4+/5.    Time  6    Period  Weeks    Status  On-going      PT LONG TERM GOAL #4   Title  Patient to report 75% improvement in ability to reach behind her back.    Time  6    Period  Weeks    Status  Partially Met   04/07/19:  notes 50% improvement in behind back motion           Plan - 04/07/19 1737    Clinical Impression Statement  Pt. noting 50% improvement in ability to reach behind back.  LTG #4 partially achieved.  Demonstrating much improved PROM/AROM in most all planes today.  LTG #2 partially achieved.  Reports saw MD yesterday who feels she can reduce her frequency of PT to 1x/week.  Tolerated all ROM activities well and encouraged pt. to continue daily sidelying HEP abduction activity for improved AROM control with abduction.  [place]de visit pain free.    Comorbidities  GERD, L breast CA  with lumpectomy, radiation, & chemo, R RTC repair 2010    Rehab Potential  Good    PT Treatment/Interventions  ADLs/Self Care Home Management;Cryotherapy;Moist Heat;Therapeutic exercise;Therapeutic activities;Functional mobility training;Neuromuscular re-education;Patient/family education;Manual techniques;Taping;Splinting;Energy conservation;Dry needling;Passive range of motion;Scar mobilization    PT Next Visit Plan  PROM, stretching, STM    Consulted and Agree with Plan of Care  Patient       Patient will benefit from skilled therapeutic intervention in order to improve the following deficits and impairments:  Hypomobility, Decreased activity tolerance, Decreased strength, Increased fascial restricitons, Impaired UE functional use, Pain, Improper body mechanics, Decreased range of motion, Postural dysfunction  Visit Diagnosis: Chronic left shoulder pain  Stiffness of left shoulder, not elsewhere classified  Other symptoms and signs involving the musculoskeletal system     Problem List Patient Active Problem List   Diagnosis Date Noted  . Carcinoma of upper-outer quadrant of left breast in female, estrogen receptor negative ([place]) 07/12/2018  . Seizure-like activity ([place])   . Hyponatremia   . Recurrent syncope 03/08/2018  . LFT elevation   . Seizure ([place])   . Thrombocytopenia ([place])   . Port-A-Cath in place 03/04/2018  . Genetic testing 02/23/2018  . Family history of breast cancer   . Family history of prostate cancer   . Family history of ovarian cancer   . Malignant neoplasm of left female breast ([place]) 02/03/2018  . Prediabetes 01/06/2018  . Reactive airway disease without complication 67/34/1937  . Class 1 obesity with serious comorbidity and body mass index (BMI) of 32.0 to 32.9 in adult 12/22/2017    Bess Harvest, PTA 04/07/19 6:00 PM   [facility] [address]  [place], [place], [address] Phone:  [phone]   Fax:  [phone]  Name: Christy Serrano MRN: [id number] Date of Birth: 08-Jul-1955

## 2019-04-08 ENCOUNTER — Encounter: Payer: Self-pay | Admitting: Orthopedic Surgery

## 2019-04-08 NOTE — Progress Notes (Signed)
Office Visit Note   Patient: Christy Serrano           Date of Birth: 1955/12/27           MRN: [id number] Visit Date: 04/06/2019 Requested by: Velna Hatchet, MD [address] [place],  [place] [address] PCP: Velna Hatchet, MD  Subjective: No chief complaint on file.   HPI: Christy Serrano is a 64 y.o. female who presents to the office complaining of left shoulder pain.  She returns for follow-up of left shoulder adhesive capsulitis.  Her last office visit was on 02/02/2019.  She has been in physical therapy and she is going 1 time a week.  Her pain is improved and she is no longer waking with pain.  She denies any neck pain or radicular symptoms.  She is taking Tylenol and Vicodin as needed.  Overall she feels like she is progressing and has better range of motion though she is still not back at 100%.  She does not desire an injection today.  She has a history of a right shoulder rotator cuff repair.  She has never had left shoulder surgery.  She does have a history of radiation therapy to that left shoulder.  She had MRI of the left shoulder on 11/03/2018 that revealed a partial thickness tear of the distal supraspinatus at the insertion.              ROS:  All systems reviewed are negative as they relate to the chief complaint within the history of present illness.  Patient denies fevers or chills.  Assessment & Plan: Visit Diagnoses:  1. Adhesive capsulitis of left shoulder   2. Nontraumatic incomplete tear of left rotator cuff     Plan: Patient is a 64 year old female who presents complaining of left shoulder pain.  She is following up for left shoulder adhesive capsulitis.  Her range of motion is improving as well as her pain.  She has been doing physical therapy with good success.  She does not desire an injection today.  Overall she is progressing.  Plan for patient to return to the office in 3 months for clinical recheck.  We will put the ultrasound on her shoulder at the next  visit to examine her supraspinatus tear.  Patient agrees with plan and will follow up with the office in 3 months or sooner if symptoms worsen.  Follow-Up Instructions: No follow-ups on file.   Orders:  No orders of the defined types were placed in this encounter.  No orders of the defined types were placed in this encounter.     Procedures: No procedures performed   Clinical Data: No additional findings.  Objective: Vital Signs: There were no vitals taken for this visit.  Physical Exam:  Constitutional: Patient appears well-developed HEENT:  Head: Normocephalic Eyes:EOM are normal Neck: Normal range of motion Cardiovascular: Normal rate Pulmonary/chest: Effort normal Neurologic: Patient is alert Skin: Skin is warm Psychiatric: Patient has normal mood and affect  Ortho Exam:  Left shoulder Exam Able to forward flex and abduct shoulder overhead.  Somewhat diminished range of motion compared with the right shoulder. Slight loss of external rotation compared with the contralateral side No TTP over the [facility] joint or bicipital groove Good subscapularis, supraspinatus, and infraspinatus strength  Negative Hawkins impingement 5/5 grip strength, forearm pronation/supination, and bicep strength  Specialty Comments:  No specialty comments available.  Imaging: No results found.   PMFS History: Patient Active Problem List   Diagnosis  Date Noted  . Carcinoma of upper-outer quadrant of left breast in female, estrogen receptor negative ([place]) 07/12/2018  . Seizure-like activity ([place])   . Hyponatremia   . Recurrent syncope 03/08/2018  . LFT elevation   . Seizure ([place])   . Thrombocytopenia ([place])   . Port-A-Cath in place 03/04/2018  . Genetic testing 02/23/2018  . Family history of breast cancer   . Family history of prostate cancer   . Family history of ovarian cancer   . Malignant neoplasm of left female breast ([place]) 02/03/2018  . Prediabetes 01/06/2018  . Reactive  airway disease without complication [id number]  . Class 1 obesity with serious comorbidity and body mass index (BMI) of 32.0 to 32.9 in adult 12/22/2017   Past Medical History:  Diagnosis Date  . Breast cancer ([place]) 2020   Left Breast Cancer  . Cancer ([place])    left breast  . Chronic bronchitis ([place])   . Family history of breast cancer   . Family history of ovarian cancer   . Family history of prostate cancer   . History of radiation therapy 09/02/18- 10/14/18   Left Breast 25 fx for a total of 50 Gy, Left SCV, PA 25 fractions for a total of 50 Gy, Left Breast boost 5 fractions, for a total of 10 Gy.   . Lactose intolerance   . Personal history of chemotherapy 2020   Left Breast Cancer  . Personal history of radiation therapy 2020   Left Breast Cancer  . PONV (postoperative nausea and vomiting)     Family History  Problem Relation Age of Onset  . High blood pressure Mother   . Heart attack Mother        CHF  . High blood pressure Father   . Prostate cancer Father        dx mid 60s  . Breast cancer Sister 64       negative [employer] 46 gene panel  . Breast cancer Paternal Aunt        dx mid 53s  . Cancer Paternal Uncle        jaw cancer d. 62s  . Ovarian cancer Maternal Grandmother        d. 55s  . Cancer Paternal Grandmother        either stomach or ovarian cancer  . Breast cancer Sister 29       "negative 21 gene panel"    Past Surgical History:  Procedure Laterality Date  . BREAST LUMPECTOMY Left 07/12/2018  . BREAST LUMPECTOMY WITH RADIOACTIVE SEED AND AXILLARY LYMPH NODE DISSECTION Left 07/12/2018   Procedure: LEFT BREAST RADIOACTIVE SEED X 3 LUMPECTOMY X 3  AND AXILLARY LYMPH NODE DISSECTION;  Surgeon: Jovita Kussmaul, MD;  Location: [facility];  Service: General;  Laterality: Left;  . PORT-A-CATH REMOVAL Right 04/01/2019   Procedure: REMOVAL PORT-A-CATH;  Surgeon: Jovita Kussmaul, MD;  Location: [facility];  Service: General;  Laterality:  Right;  . PORTACATH PLACEMENT Right 03/03/2018   Procedure: INSERTION PORT-A-CATH;  Surgeon: Jovita Kussmaul, MD;  Location: [facility];  Service: General;  Laterality: Right;  . ROTATOR CUFF REPAIR Right 2006  . TOTAL ABDOMINAL HYSTERECTOMY  2010   Social History   Occupational History  . Occupation: Quarry manager    Comment: [employer]  Tobacco Use  . Smoking status: Never Smoker  . Smokeless tobacco: Never Used  Substance and Sexual Activity  . Alcohol use: No  Comment: quit long ago  . Drug use: No  . Sexual activity: Not on file

## 2019-04-10 ENCOUNTER — Encounter: Payer: Self-pay | Admitting: Orthopedic Surgery

## 2019-04-11 ENCOUNTER — Other Ambulatory Visit: Payer: Self-pay

## 2019-04-11 ENCOUNTER — Ambulatory Visit: Payer: Managed Care, Other (non HMO) | Admitting: Physical Therapy

## 2019-04-11 ENCOUNTER — Encounter: Payer: Self-pay | Admitting: Physical Therapy

## 2019-04-11 DIAGNOSIS — M25612 Stiffness of left shoulder, not elsewhere classified: Secondary | ICD-10-CM

## 2019-04-11 DIAGNOSIS — M25512 Pain in left shoulder: Secondary | ICD-10-CM | POA: Diagnosis not present

## 2019-04-11 DIAGNOSIS — G8929 Other chronic pain: Secondary | ICD-10-CM

## 2019-04-11 DIAGNOSIS — R29898 Other symptoms and signs involving the musculoskeletal system: Secondary | ICD-10-CM

## 2019-04-11 NOTE — Therapy (Signed)
[facility] [address]  [place], [place], [address] Phone: [phone]   Fax:  [phone]  Physical Therapy Treatment  Patient Details  Name: Christy Serrano MRN: [id number] Date of Birth: 22-Nov-1955 Referring Provider (PT): Meredith Pel, MD   Encounter Date: 04/11/2019  PT End of Session - 04/11/19 1014    Visit Number  7    Number of Visits  13    Date for PT Re-Evaluation  04/05/19    Authorization Type  [employer]    PT Start Time  0932    PT Stop Time  1012    PT Time Calculation (min)  40 min    Activity Tolerance  Patient tolerated treatment well    Behavior During Therapy  [facility] for tasks assessed/performed       Past Medical History:  Diagnosis Date  . Breast cancer ([place]) 2020   Left Breast Cancer  . Cancer ([place])    left breast  . Chronic bronchitis ([place])   . Family history of breast cancer   . Family history of ovarian cancer   . Family history of prostate cancer   . History of radiation therapy 09/02/18- 10/14/18   Left Breast 25 fx for a total of 50 Gy, Left SCV, PA 25 fractions for a total of 50 Gy, Left Breast boost 5 fractions, for a total of 10 Gy.   . Lactose intolerance   . Personal history of chemotherapy 2020   Left Breast Cancer  . Personal history of radiation therapy 2020   Left Breast Cancer  . PONV (postoperative nausea and vomiting)     Past Surgical History:  Procedure Laterality Date  . BREAST LUMPECTOMY Left 07/12/2018  . BREAST LUMPECTOMY WITH RADIOACTIVE SEED AND AXILLARY LYMPH NODE DISSECTION Left 07/12/2018   Procedure: LEFT BREAST RADIOACTIVE SEED X 3 LUMPECTOMY X 3  AND AXILLARY LYMPH NODE DISSECTION;  Surgeon: Jovita Kussmaul, MD;  Location: [place];  Service: General;  Laterality: Left;  . PORT-A-CATH REMOVAL Right 04/01/2019   Procedure: REMOVAL PORT-A-CATH;  Surgeon: Jovita Kussmaul, MD;  Location: [place];  Service: General;   Laterality: Right;  . PORTACATH PLACEMENT Right 03/03/2018   Procedure: INSERTION PORT-A-CATH;  Surgeon: Jovita Kussmaul, MD;  Location: [place];  Service: General;  Laterality: Right;  . ROTATOR CUFF REPAIR Right 2006  . TOTAL ABDOMINAL HYSTERECTOMY  2010    There were no vitals filed for this visit.  Subjective Assessment - 04/11/19 0933    Subjective  Feeling fine. Feels that the tightness in her side is getting better. Feels that she is still unable to easily reach behind her back and reaching overhead on her own.    Pertinent History  GERD, L breast CA with lumpectomy, radiation, & chemo, R RTC repair 2010    Diagnostic tests  11/02/18 L shoulder MRI: Focal high-grade partial-thickness bursal surface tear of the distal supraspinatus tendon at the insertion. No full-thicknesstear. Mild subacromial/subdeltoid bursitis. Mild AC OA.    Patient Stated Goals  "be able to scratch by back"    Currently in Pain?  No/denies                  [facility] Adult PT Treatment/Exercise - 04/11/19 0001      Shoulder Exercises: Supine   Protraction  Strengthening;Left    Protraction Weight (lbs)  2    Protraction Limitations  good ROM  Flexion  Strengthening;Left;10 reps;Weights    Shoulder Flexion Weight (lbs)  1    Flexion Limitations  good control      Shoulder Exercises: Sidelying   External Rotation  Left;10 reps;Strengthening    External Rotation Weight (lbs)  1    External Rotation Limitations  stopping at neutral    ABduction  AROM;Left;10 reps    ABduction Limitations  thumb up   stopping at ~100 deg to avoid flexion     Shoulder Exercises: Standing   Extension  Strengthening;Both;10 reps;Theraband    Theraband Level (Shoulder Extension)  Level 2 (Red)    Extension Limitations  to neutral   cues to relax B UT   Row  Strengthening;Both;10 reps;Theraband    Theraband Level (Shoulder Row)  Level 2 (Red)    Row Limitations  cues to stop at neutral   cues to  relax B UT     Shoulder Exercises: Pulleys   Flexion  3 minutes    Flexion Limitations  to tolerance    Scaption  3 minutes    Scaption Limitations  to tolerance      Shoulder Exercises: Stretch   Other Shoulder Stretches  L IR stretch with strap 5x5"   cues to avoid pushing into pain   Other Shoulder Stretches  L/R UT stretch 30" to tolerance             PT Education - 04/11/19 1014    Education Details  update to HEP; administered red TB    Person(s) Educated  Patient    Methods  Explanation;Demonstration;Tactile cues;Verbal cues;Handout    Comprehension  Verbalized understanding;Returned demonstration       PT Short Term Goals - 03/15/19 1635      PT SHORT TERM GOAL #1   Title  Patient to be indpendent with initial HEP.    Time  3    Period  Weeks    Status  Achieved    Target Date  03/15/19        PT Long Term Goals - 04/07/19 1737      PT LONG TERM GOAL #1   Title  Patient to be independent with advanced HEP.    Time  6    Period  Weeks    Status  Partially Met      PT LONG TERM GOAL #2   Title  Patient to demonstrate L shoulder AROM WFL and without pain limiting.    Time  6    Period  Weeks    Status  On-going   04/07/19:  met for AROM functional ER     PT LONG TERM GOAL #3   Title  Patient to demonstrate L shoulder strength >/=4+/5.    Time  6    Period  Weeks    Status  On-going      PT LONG TERM GOAL #4   Title  Patient to report 75% improvement in ability to reach behind her back.    Time  6    Period  Weeks    Status  Partially Met   04/07/19:  notes 50% improvement in behind back motion           Plan - 04/11/19 1015    Clinical Impression Statement  Patient reporting improvement in pain and tightness over L lats.  Notes that she still struggles with reaching behind the back into IR as well as weakness with reaching overhead. Worked on supine shoulder flexion and protraction strengthening  with good tolerance and ROM. Able to  increase weighted resistance with ER with c/o muscle fatigue but no pain. Initiated light banded resistance for periscapular strengthening with patient requiring cues to relax B UT. Updated HEP with exercises that were well-tolerated today. Patient reported understanding. No complaints at end of session. Patient progressing well towards goals.    Comorbidities  GERD, L breast CA with lumpectomy, radiation, & chemo, R RTC repair 2010    Rehab Potential  Good    PT Treatment/Interventions  ADLs/Self Care Home Management;Cryotherapy;Moist Heat;Therapeutic exercise;Therapeutic activities;Functional mobility training;Neuromuscular re-education;Patient/family education;Manual techniques;Taping;Splinting;Energy conservation;Dry needling;Passive range of motion;Scar mobilization    PT Next Visit Plan  AROM, strengthening, IR stretching to tolerance    Consulted and Agree with Plan of Care  Patient       Patient will benefit from skilled therapeutic intervention in order to improve the following deficits and impairments:  Hypomobility, Decreased activity tolerance, Decreased strength, Increased fascial restricitons, Impaired UE functional use, Pain, Improper body mechanics, Decreased range of motion, Postural dysfunction  Visit Diagnosis: Chronic left shoulder pain  Stiffness of left shoulder, not elsewhere classified  Other symptoms and signs involving the musculoskeletal system     Problem List Patient Active Problem List   Diagnosis Date Noted  . Carcinoma of upper-outer quadrant of left breast in female, estrogen receptor negative ([place]) 07/12/2018  . Seizure-like activity ([place])   . Hyponatremia   . Recurrent syncope 03/08/2018  . LFT elevation   . Seizure ([place])   . Thrombocytopenia ([place])   . Port-A-Cath in place 03/04/2018  . Genetic testing 02/23/2018  . Family history of breast cancer   . Family history of prostate cancer   . Family history of ovarian cancer   . Malignant neoplasm of  left female breast ([place]) 02/03/2018  . Prediabetes 01/06/2018  . Reactive airway disease without complication 83/72/9021  . Class 1 obesity with serious comorbidity and body mass index (BMI) of 32.0 to 32.9 in adult 12/22/2017     Janene Harvey, PT, DPT 04/11/19 12:12 PM   [facility] [address]  [place], [place], [address] Phone: [phone]   Fax:  [phone]  Name: Christy Serrano MRN: [id number] Date of Birth: [birth year]

## 2019-04-12 ENCOUNTER — Other Ambulatory Visit: Payer: Self-pay

## 2019-04-12 ENCOUNTER — Ambulatory Visit (INDEPENDENT_AMBULATORY_CARE_PROVIDER_SITE_OTHER): Payer: Managed Care, Other (non HMO) | Admitting: Bariatrics

## 2019-04-12 ENCOUNTER — Encounter (INDEPENDENT_AMBULATORY_CARE_PROVIDER_SITE_OTHER): Payer: Self-pay | Admitting: Bariatrics

## 2019-04-12 VITALS — BP 118/71 | HR 65 | Temp 97.9°F | Ht 65.0 in | Wt 172.0 lb

## 2019-04-12 DIAGNOSIS — E669 Obesity, unspecified: Secondary | ICD-10-CM | POA: Diagnosis not present

## 2019-04-12 DIAGNOSIS — J45909 Unspecified asthma, uncomplicated: Secondary | ICD-10-CM

## 2019-04-12 DIAGNOSIS — Z6830 Body mass index (BMI) 30.0-30.9, adult: Secondary | ICD-10-CM | POA: Diagnosis not present

## 2019-04-12 DIAGNOSIS — R7303 Prediabetes: Secondary | ICD-10-CM | POA: Diagnosis not present

## 2019-04-12 DIAGNOSIS — E66811 Obesity, class 1: Secondary | ICD-10-CM

## 2019-04-12 NOTE — Progress Notes (Signed)
Chief Complaint:   Christy Serrano is here to discuss her progress with her Christy treatment plan along with follow-up of her Christy related diagnoses. Christy Serrano is on the Category 2 Plan and states she is following her eating plan approximately 50% of the time. Christy Serrano states she is exercising 0 minutes 0 times per week.  Today's visit was #: 3 Starting weight: 195 lbs Starting date: 12/22/2017 Today's weight: 172 lbs Today's date: 04/12/2019 Total lbs lost to date: 23 Total lbs lost since last in-office visit: 1  Interim History: Christy Serrano is down 1 lb and has done well overall.  Subjective:   Prediabetes. Christy Serrano has a diagnosis of prediabetes based on her elevated HgA1c and was informed this puts her at greater risk of developing diabetes. She continues to work on diet and exercise to decrease her risk of diabetes. She denies nausea or hypoglycemia. No polyphagia.  Lab Results  Component Value Date   HGBA1C 5.7 (H) 10/20/2018   Lab Results  Component Value Date   INSULIN 4.8 05/31/2018   INSULIN 13.7 12/22/2017   Reactive airway disease without complication, unspecified asthma severity, unspecified whether persistent. Christy Serrano is taking Albuterol and Symbicort. She has not had to use her inhaler in many months.  Assessment/Plan:   Prediabetes. Christy Serrano will continue to work on weight loss, exercise, increasing healthy fats and protein, and decreasing simple carbohydrates to help decrease the risk of diabetes.   Reactive airway disease without complication, unspecified asthma severity, unspecified whether persistent. Christy Serrano will continue her medications as prescribed and use Albuterol as needed.  Class 1 Christy with serious comorbidity and body mass index (BMI) of 30.0 to 30.9 in adult, unspecified Christy type - BMI greater than 30 at start of program.  Christy Serrano is currently in the action stage of change. As such, her goal is to continue with weight  loss efforts. She has agreed to the Category 2 Plan alternating with the Pescatarian plan with additional Category 1 and 2 options.  She will work on meal planning and intentional eating.  Exercise goals: Christy Serrano is getting 7,000 to 10,000 steps at work.  Behavioral modification strategies: increasing lean protein intake, decreasing simple carbohydrates, increasing vegetables, increasing water intake, decreasing eating out, no skipping meals, meal planning and cooking strategies, keeping healthy foods in the home and planning for success.  Christy Serrano has agreed to follow-up with our clinic in 2 months. She was informed of the importance of frequent follow-up visits to maximize her success with intensive lifestyle modifications for her multiple health conditions.   Objective:   Blood pressure 118/71, pulse 65, temperature 97.9 F (36.6 C), temperature source Oral, height 5\' 5"  (1.651 m), weight 172 lb (78 kg), SpO2 100 %. Body mass index is 28.62 kg/m.  General: Cooperative, alert, well developed, in no acute distress. HEENT: Conjunctivae and lids unremarkable. Cardiovascular: Regular rhythm.  Lungs: Normal work of breathing. Neurologic: No focal deficits.   Lab Results  Component Value Date   CREATININE 1.18 (H) 03/29/2019   BUN 14 03/29/2019   NA 138 03/29/2019   K 4.5 03/29/2019   CL 104 03/29/2019   CO2 24 03/29/2019   Lab Results  Component Value Date   ALT 19 02/25/2019   AST 21 02/25/2019   ALKPHOS 80 02/25/2019   BILITOT 0.6 02/25/2019   Lab Results  Component Value Date   HGBA1C 5.7 (H) 10/20/2018   HGBA1C 5.6 05/31/2018   HGBA1C 6.1 (H) 12/22/2017   Lab Results  Component Value Date   INSULIN 4.8 05/31/2018   INSULIN 13.7 12/22/2017   Lab Results  Component Value Date   TSH 3.644 03/10/2018   Lab Results  Component Value Date   CHOL 176 12/22/2017   HDL 61 12/22/2017   LDLCALC 93 12/22/2017   TRIG 111 12/22/2017   Lab Results  Component Value Date    WBC 3.4 (L) 02/25/2019   HGB 10.3 (L) 02/25/2019   HCT 32.1 (L) 02/25/2019   MCV 93.3 02/25/2019   PLT 168 02/25/2019   No results found for: IRON, TIBC, FERRITIN  Attestation Statements:   Reviewed by clinician on day of visit: allergies, medications, problem list, medical history, surgical history, family history, social history, and previous encounter notes.  Time spent on visit including pre-visit chart review and post-visit care was 20 minutes.   Migdalia Dk, am acting as Location manager for [employer], DO   I have reviewed the above documentation for accuracy and completeness, and I agree with the above. Jearld Lesch, DO

## 2019-04-13 ENCOUNTER — Encounter (INDEPENDENT_AMBULATORY_CARE_PROVIDER_SITE_OTHER): Payer: Self-pay | Admitting: Bariatrics

## 2019-04-14 ENCOUNTER — Encounter: Payer: 59 | Admitting: Physical Therapy

## 2019-04-18 ENCOUNTER — Other Ambulatory Visit: Payer: Self-pay

## 2019-04-18 ENCOUNTER — Encounter: Payer: Self-pay | Admitting: Physical Therapy

## 2019-04-18 ENCOUNTER — Ambulatory Visit: Payer: Managed Care, Other (non HMO) | Admitting: Physical Therapy

## 2019-04-18 DIAGNOSIS — G8929 Other chronic pain: Secondary | ICD-10-CM

## 2019-04-18 DIAGNOSIS — M25512 Pain in left shoulder: Secondary | ICD-10-CM

## 2019-04-18 DIAGNOSIS — M25612 Stiffness of left shoulder, not elsewhere classified: Secondary | ICD-10-CM

## 2019-04-18 DIAGNOSIS — R29898 Other symptoms and signs involving the musculoskeletal system: Secondary | ICD-10-CM

## 2019-04-18 NOTE — Therapy (Signed)
[facility] [address]  [place], [place], [address] Phone: [phone]   Fax:  [phone]  Physical Therapy Treatment  Patient Details  Name: Christy Serrano MRN: [id number] Date of Birth: May 30, 1955 Referring Provider (PT): Meredith Pel, MD   Encounter Date: 04/18/2019  PT End of Session - 04/18/19 1013    Visit Number  8    Number of Visits  13    Date for PT Re-Evaluation  04/05/19    Authorization Type  [employer]igna    PT Start Time  [phone]    PT Stop Time  1013    PT Time Calculation (min)  42 min    Activity Tolerance  Patient tolerated treatment well    Behavior During Therapy  [facility] for tasks assessed/performed       Past Medical History:  Diagnosis Date  . Breast cancer ([place]) 2020   Left Breast Cancer  . Cancer ([place])    left breast  . Chronic bronchitis ([place])   . Family history of breast cancer   . Family history of ovarian cancer   . Family history of prostate cancer   . History of radiation therapy 09/02/18- 10/14/18   Left Breast 25 fx for a total of 50 Gy, Left SCV, PA 25 fractions for a total of 50 Gy, Left Breast boost 5 fractions, for a total of 10 Gy.   . Lactose intolerance   . Personal history of chemotherapy 2020   Left Breast Cancer  . Personal history of radiation therapy 2020   Left Breast Cancer  . PONV (postoperative nausea and vomiting)     Past Surgical History:  Procedure Laterality Date  . BREAST LUMPECTOMY Left 07/12/2018  . BREAST LUMPECTOMY WITH RADIOACTIVE SEED AND AXILLARY LYMPH NODE DISSECTION Left 07/12/2018   Procedure: LEFT BREAST RADIOACTIVE SEED X 3 LUMPECTOMY X 3  AND AXILLARY LYMPH NODE DISSECTION;  Surgeon: Jovita Kussmaul, MD;  Location: [facility];  Service: General;  Laterality: Left;  . PORT-A-CATH REMOVAL Right 04/01/2019   Procedure: REMOVAL PORT-A-CATH;  Surgeon: Jovita Kussmaul, MD;  Location: [facility];  Service: General;   Laterality: Right;  . PORTACATH PLACEMENT Right 03/03/2018   Procedure: INSERTION PORT-A-CATH;  Surgeon: Jovita Kussmaul, MD;  Location: [facility];  Service: General;  Laterality: Right;  . ROTATOR CUFF REPAIR Right 2006  . TOTAL ABDOMINAL HYSTERECTOMY  2010    There were no vitals filed for this visit.  Subjective Assessment - 04/18/19 0932    Subjective  Woke up this AM with a little twinge in her L shoulder.    Pertinent History  GERD, L breast CA with lumpectomy, radiation, & chemo, R RTC repair 2010    Diagnostic tests  11/02/18 L shoulder MRI: Focal high-grade partial-thickness bursal surface tear of the distal supraspinatus tendon at the insertion. No full-thicknesstear. Mild subacromial/subdeltoid bursitis. Mild AC OA.    Patient Stated Goals  "be able to scratch by back"    Currently in Pain?  Yes    Pain Score  3     Pain Location  Shoulder    Pain Orientation  Left    Pain Descriptors / Indicators  Dull    Pain Type  Chronic pain                    [facility] Adult PT Treatment/Exercise - 04/18/19 0001      Shoulder  Exercises: Supine   Protraction  Strengthening;Left;10 reps;Weights    Protraction Weight (lbs)  2, 4    Protraction Limitations  2nd set with 4#; good ROM    Flexion  Strengthening;Left;Weights;5 reps    Shoulder Flexion Weight (lbs)  1,2     Flexion Limitations  2x5; good tolerance      Shoulder Exercises: Sidelying   ABduction  Strengthening;Left;5 reps;Weights    ABduction Weight (lbs)  1    ABduction Limitations  2x5; thumb up      Shoulder Exercises: Pulleys   Flexion  3 minutes    Flexion Limitations  to tolerance    Scaption  3 minutes    Scaption Limitations  to tolerance      Shoulder Exercises: ROM/Strengthening   Lat Pull Limitations  10# 5x, 15# 5x      Shoulder Exercises: Stretch   Corner Stretch  2 reps;30 seconds    Corner Stretch Limitations  mid pec stretch in doorway   cues to avoid pushing into pain    Other Shoulder Stretches  L ER stretch with strap 10x3"    Other Shoulder Stretches  L ER stretch in doorway 5x10"   report of nonpainful pop in shoulder            PT Education - 04/18/19 1013    Education Details  update to HEP and consolidation of HEP    Person(s) Educated  Patient    Methods  Explanation;Demonstration;Tactile cues;Verbal cues;Handout    Comprehension  Verbalized understanding;Returned demonstration       PT Short Term Goals - 03/15/19 1635      PT SHORT TERM GOAL #1   Title  Patient to be indpendent with initial HEP.    Time  3    Period  Weeks    Status  Achieved    Target Date  03/15/19        PT Long Term Goals - 04/07/19 1737      PT LONG TERM GOAL #1   Title  Patient to be independent with advanced HEP.    Time  6    Period  Weeks    Status  Partially Met      PT LONG TERM GOAL #2   Title  Patient to demonstrate L shoulder AROM WFL and without pain limiting.    Time  6    Period  Weeks    Status  On-going   04/07/19:  met for AROM functional ER     PT LONG TERM GOAL #3   Title  Patient to demonstrate L shoulder strength >/=4+/5.    Time  6    Period  Weeks    Status  On-going      PT LONG TERM GOAL #4   Title  Patient to report 75% improvement in ability to reach behind her back.    Time  6    Period  Weeks    Status  Partially Met   04/07/19:  notes 50% improvement in behind back motion           Plan - 04/18/19 1014    Clinical Impression Statement  Patient reporting small twinge of pain in L shoulder this AM upon waking up. Reviewed HEP folder for consolidation of HEP for max benefit. Initiated ER stretching with strap and in doorway for improvement in ER ROM. Patient with report of best stretch in doorway, thus this was added into HEP. Patient reported understanding. Reviewed supine serratus  punch and shoulder flexion with light weighted resistance, with patient demonstrating good carryover. Able to tolerate slight  increase in weight without complaints. Initiated sidelying abduction with patient demonstrating improved ROM but with c/o slight discomfort over L side of neck. Ended session without pain, but with report of muscle fatigue. Patient progressing towards goals.    Comorbidities  GERD, L breast CA with lumpectomy, radiation, & chemo, R RTC repair 2010    Rehab Potential  Good    PT Treatment/Interventions  ADLs/Self Care Home Management;Cryotherapy;Moist Heat;Therapeutic exercise;Therapeutic activities;Functional mobility training;Neuromuscular re-education;Patient/family education;Manual techniques;Taping;Splinting;Energy conservation;Dry needling;Passive range of motion;Scar mobilization    PT Next Visit Plan  AROM, strengthening, IR stretching to tolerance    Consulted and Agree with Plan of Care  Patient       Patient will benefit from skilled therapeutic intervention in order to improve the following deficits and impairments:  Hypomobility, Decreased activity tolerance, Decreased strength, Increased fascial restricitons, Impaired UE functional use, Pain, Improper body mechanics, Decreased range of motion, Postural dysfunction  Visit Diagnosis: Chronic left shoulder pain  Stiffness of left shoulder, not elsewhere classified  Other symptoms and signs involving the musculoskeletal system     Problem List Patient Active Problem List   Diagnosis Date Noted  . Carcinoma of upper-outer quadrant of left breast in female, estrogen receptor negative ([place]) 07/12/2018  . Seizure-like activity ([place])   . Hyponatremia   . Recurrent syncope 03/08/2018  . LFT elevation   . Seizure ([place])   . Thrombocytopenia ([place])   . Port-A-Cath in place 03/04/2018  . Genetic testing 02/23/2018  . Family history of breast cancer   . Family history of prostate cancer   . Family history of ovarian cancer   . Malignant neoplasm of left female breast ([place]) 02/03/2018  . Prediabetes 01/06/2018  . Reactive airway  disease without complication 16/12/9602  . Class 1 obesity with serious comorbidity and body mass index (BMI) of 32.0 to 32.9 in adult 12/22/2017     Janene Harvey, PT, DPT 04/18/19 10:18 AM   [facility] [address]  [place], [place], [address] Phone: [phone]   Fax:  [phone]  Name: Christy Serrano MRN: [id number] Date of Birth: 1955/10/21

## 2019-04-20 ENCOUNTER — Telehealth: Payer: Self-pay | Admitting: Hematology and Oncology

## 2019-04-20 NOTE — Telephone Encounter (Signed)
Rescheduled per MD. Hulen Skains and spoke with pt, confirmed 2/18 appt

## 2019-04-25 ENCOUNTER — Other Ambulatory Visit: Payer: Self-pay

## 2019-04-25 ENCOUNTER — Ambulatory Visit: Payer: Managed Care, Other (non HMO) | Attending: Orthopedic Surgery | Admitting: Physical Therapy

## 2019-04-25 ENCOUNTER — Encounter: Payer: Self-pay | Admitting: Physical Therapy

## 2019-04-25 DIAGNOSIS — M25612 Stiffness of left shoulder, not elsewhere classified: Secondary | ICD-10-CM | POA: Diagnosis present

## 2019-04-25 DIAGNOSIS — R29898 Other symptoms and signs involving the musculoskeletal system: Secondary | ICD-10-CM | POA: Diagnosis present

## 2019-04-25 DIAGNOSIS — M25512 Pain in left shoulder: Secondary | ICD-10-CM | POA: Insufficient documentation

## 2019-04-25 DIAGNOSIS — G8929 Other chronic pain: Secondary | ICD-10-CM

## 2019-04-25 NOTE — Therapy (Signed)
[facility] [place] [address]  [place], [place], [address] Phone: [phone]   Fax:  [phone]  Physical Therapy Treatment  Patient Details  Name: Christy Serrano MRN: [id number] Date of Birth: 09-Mar-1956 Referring Provider (PT): Meredith Pel, MD   Encounter Date: 04/25/2019  PT End of Session - 04/25/19 1017    Visit Number  9    Number of Visits  13    Date for PT Re-Evaluation  04/05/19    Authorization Type  [employer]    PT Start Time  0932    PT Stop Time  1013    PT Time Calculation (min)  41 min    Activity Tolerance  Patient tolerated treatment well    Behavior During Therapy  [facility] for tasks assessed/performed       Past Medical History:  Diagnosis Date  . Breast cancer ([place]) 2020   Left Breast Cancer  . Cancer ([place])    left breast  . Chronic bronchitis ([place])   . Family history of breast cancer   . Family history of ovarian cancer   . Family history of prostate cancer   . History of radiation therapy 09/02/18- 10/14/18   Left Breast 25 fx for a total of 50 Gy, Left SCV, PA 25 fractions for a total of 50 Gy, Left Breast boost 5 fractions, for a total of 10 Gy.   . Lactose intolerance   . Personal history of chemotherapy 2020   Left Breast Cancer  . Personal history of radiation therapy 2020   Left Breast Cancer  . PONV (postoperative nausea and vomiting)     Past Surgical History:  Procedure Laterality Date  . BREAST LUMPECTOMY Left 07/12/2018  . BREAST LUMPECTOMY WITH RADIOACTIVE SEED AND AXILLARY LYMPH NODE DISSECTION Left 07/12/2018   Procedure: LEFT BREAST RADIOACTIVE SEED X 3 LUMPECTOMY X 3  AND AXILLARY LYMPH NODE DISSECTION;  Surgeon: Jovita Kussmaul, MD;  Location: [facility];  Service: General;  Laterality: Left;  . PORT-A-CATH REMOVAL Right 04/01/2019   Procedure: REMOVAL PORT-A-CATH;  Surgeon: Jovita Kussmaul, MD;  Location: [facility];  Service: General;   Laterality: Right;  . PORTACATH PLACEMENT Right 03/03/2018   Procedure: INSERTION PORT-A-CATH;  Surgeon: Jovita Kussmaul, MD;  Location: [facility];  Service: General;  Laterality: Right;  . ROTATOR CUFF REPAIR Right 2006  . TOTAL ABDOMINAL HYSTERECTOMY  2010    There were no vitals filed for this visit.  Subjective Assessment - 04/25/19 0934    Subjective  Had some soreness and feeling of her shoulder being "tired" after last session. noticed some sore spots over her L anterior and posterior shoulder.    Pertinent History  GERD, L breast CA with lumpectomy, radiation, & chemo, R RTC repair 2010    Diagnostic tests  11/02/18 L shoulder MRI: Focal high-grade partial-thickness bursal surface tear of the distal supraspinatus tendon at the insertion. No full-thicknesstear. Mild subacromial/subdeltoid bursitis. Mild AC OA.    Patient Stated Goals  "be able to scratch by back"    Currently in Pain?  No/denies                   [facility] Adult PT Treatment/Exercise - 04/25/19 0001      Self-Care   Self-Care  Other Self-Care Comments    Other Self-Care Comments   edu, demonstration, and practice using tennis ball over L posterior shoulder and chest  Shoulder Exercises: ROM/Strengthening   UBE (Upper Arm Bike)  L1 x 3 min forward/backward      Shoulder Exercises: Stretch   Other Shoulder Stretches  L LS stretch 2x30" to tolerance      Manual Therapy   Manual Therapy  Soft tissue mobilization;Myofascial release;Taping    Manual therapy comments  sitting    Soft tissue mobilization  STM to L UT, LS, pec, anterior delt and proximal biceps tendon- TTP throughout    Myofascial Release  manual TPR to L UT, LS, pec- palpable trigger pts and c/o tenderness    Kinesiotex  Create Space      Kinesiotix   Create Space  2 strips at 50% stretch along L lats- sensitive skin variety   trial small strip of non-sensitive variety for skin response            PT Education  - 04/25/19 1017    Education Details  update to HEP; edu on KT tape wear time, removal, precautions    Person(s) Educated  Patient    Methods  Explanation;Demonstration;Tactile cues;Verbal cues;Handout    Comprehension  Verbalized understanding;Returned demonstration       PT Short Term Goals - 03/15/19 1635      PT SHORT TERM GOAL #1   Title  Patient to be indpendent with initial HEP.    Time  3    Period  Weeks    Status  Achieved    Target Date  03/15/19        PT Long Term Goals - 04/07/19 1737      PT LONG TERM GOAL #1   Title  Patient to be independent with advanced HEP.    Time  6    Period  Weeks    Status  Partially Met      PT LONG TERM GOAL #2   Title  Patient to demonstrate L shoulder AROM WFL and without pain limiting.    Time  6    Period  Weeks    Status  On-going   04/07/19:  met for AROM functional ER     PT LONG TERM GOAL #3   Title  Patient to demonstrate L shoulder strength >/=4+/5.    Time  6    Period  Weeks    Status  On-going      PT LONG TERM GOAL #4   Title  Patient to report 75% improvement in ability to reach behind her back.    Time  6    Period  Weeks    Status  Partially Met   04/07/19:  notes 50% improvement in behind back motion           Plan - 04/25/19 1017    Clinical Impression Statement  Patient reporting L shoulder soreness and muscle fatigue after last session. Pointing towards sore sports over anterior and posterior L shoulder today. Noting difficulty performing supine shoulder flexion with 1 lb at home, despite performing this exercise with 2 lbs with good tolerance last session. Began session with manual therapy for improvement tin pain and soft tissue restriction. Patient reported tenderness over L UT, LS, pec, anterior delt and proximal biceps tendon. Tolerated TPR to L UT, LS, and pec. Educated patient on use of tennis ball for self-massage with good tolerance by patient. Updated HEP with LS stretch as this was well  tolerated today. Ended session with KT tape to L lats for pain relief. Patient reported good instant relief after taping.  No complaints at end of session.    Comorbidities  GERD, L breast CA with lumpectomy, radiation, & chemo, R RTC repair 2010    Rehab Potential  Good    PT Treatment/Interventions  ADLs/Self Care Home Management;Cryotherapy;Moist Heat;Therapeutic exercise;Therapeutic activities;Functional mobility training;Neuromuscular re-education;Patient/family education;Manual techniques;Taping;Splinting;Energy conservation;Dry needling;Passive range of motion;Scar mobilization    PT Next Visit Plan  AROM, strengthening, IR stretching to tolerance    Consulted and Agree with Plan of Care  Patient       Patient will benefit from skilled therapeutic intervention in order to improve the following deficits and impairments:  Hypomobility, Decreased activity tolerance, Decreased strength, Increased fascial restricitons, Impaired UE functional use, Pain, Improper body mechanics, Decreased range of motion, Postural dysfunction  Visit Diagnosis: Chronic left shoulder pain  Stiffness of left shoulder, not elsewhere classified  Other symptoms and signs involving the musculoskeletal system     Problem List Patient Active Problem List   Diagnosis Date Noted  . Carcinoma of upper-outer quadrant of left breast in female, estrogen receptor negative ([facility] [facility]) 07/12/2018  . Seizure-like activity ([place])   . Hyponatremia   . Recurrent syncope 03/08/2018  . LFT elevation   . Seizure ([place])   . Thrombocytopenia ([facility])   . Port-A-Cath in place 03/04/2018  . Genetic testing 02/23/2018  . Family history of breast cancer   . Family history of prostate cancer   . Family history of ovarian cancer   . Malignant neoplasm of left female breast ([facility]) 02/03/2018  . Prediabetes 01/06/2018  . Reactive airway disease without complication 47/84/1282  . Class 1 obesity with serious comorbidity and body mass index  (BMI) of 32.0 to 32.9 in adult 12/22/2017     Janene Harvey, PT, DPT 04/25/19 12:03 PM   [facility] [place] [address]  [place], [place], [address] Phone: [phone]   Fax:  [phone]  Name: Christy Serrano MRN: [id number] Date of Birth: [birth year]

## 2019-04-25 NOTE — Patient Instructions (Signed)
   Kinesiology tape  What is kinesiology tape?  There are many brands of kinesiology tape. KTape, Rock Tape, Body Sport, Dynamic tape, to name a few.  It is an elasticized tape designed to support the body's natural healing process. This tape provides stability and support to muscles and joints without restricting motion.  It can also help decrease swelling in the area of application.  How does it work?  The tape microscopically lifts and decompresses the skin to allow for drainage of lymph (swelling) to flow away from area, reducing inflammation. The tape has the ability to help re-educate the neuromuscular system by targeting specific receptors in the skin. The presence of the tape increases the body's awareness of posture and body mechanics.  Do not use with:  . Open wounds . Skin lesions . Adhesive allergies  In some rare cases, mild/moderate skin irritation can occur. This can include redness, itchiness, or hives. If this occurs, immediately remove tape and consult your primary care physician if symptoms are severe or do not resolve within 2 days.  Safe removal of the tape:  To remove tape safely, hold nearby skin with one hand and gentle roll tape down with other hand. You can apply oil or conditioner to tape while in shower prior to removal to loosen adhesive. DO NOT swiftly rip tape off like a band-aid, as this could cause skin tears and additional skin irritation.     For questions, please contact your therapist at:  [facility] [place] [address]  [address] [place], [place], [address] Phone: [phone]   Fax:  [phone]     

## 2019-05-02 ENCOUNTER — Ambulatory Visit: Payer: Managed Care, Other (non HMO) | Admitting: Physical Therapy

## 2019-05-03 ENCOUNTER — Encounter: Payer: Self-pay | Admitting: Physical Therapy

## 2019-05-03 ENCOUNTER — Ambulatory Visit: Payer: Managed Care, Other (non HMO) | Admitting: Physical Therapy

## 2019-05-03 ENCOUNTER — Other Ambulatory Visit: Payer: Self-pay

## 2019-05-03 DIAGNOSIS — G8929 Other chronic pain: Secondary | ICD-10-CM

## 2019-05-03 DIAGNOSIS — M25612 Stiffness of left shoulder, not elsewhere classified: Secondary | ICD-10-CM

## 2019-05-03 DIAGNOSIS — M25512 Pain in left shoulder: Secondary | ICD-10-CM | POA: Diagnosis not present

## 2019-05-03 DIAGNOSIS — R29898 Other symptoms and signs involving the musculoskeletal system: Secondary | ICD-10-CM

## 2019-05-03 NOTE — Therapy (Signed)
[facility] [place] [address]  [place], [place], [address] Phone: [phone]   Fax:  [phone]  Physical Therapy Progress Note  Patient Details  Name: Christy Serrano MRN: [id number] Date of Birth: 12/09/1955 Referring Provider (PT): Meredith Pel, MD   Progress Note Reporting Period 02/22/19 to 05/03/19  See note below for Objective Data and Assessment of Progress/Goals.     Encounter Date: 05/03/2019  PT End of Session - 05/03/19 1219    Visit Number  10    Number of Visits  13    Date for PT Re-Evaluation  04/05/19    Authorization Type  [employer]    PT Start Time  0845    PT Stop Time  0929    PT Time Calculation (min)  44 min    Activity Tolerance  Patient tolerated treatment well    Behavior During Therapy  [facility] for tasks assessed/performed       Past Medical History:  Diagnosis Date  . Breast cancer ([place]) 2020   Left Breast Cancer  . Cancer ([place])    left breast  . Chronic bronchitis ([place])   . Family history of breast cancer   . Family history of ovarian cancer   . Family history of prostate cancer   . History of radiation therapy 09/02/18- 10/14/18   Left Breast 25 fx for a total of 50 Gy, Left SCV, PA 25 fractions for a total of 50 Gy, Left Breast boost 5 fractions, for a total of 10 Gy.   . Lactose intolerance   . Personal history of chemotherapy 2020   Left Breast Cancer  . Personal history of radiation therapy 2020   Left Breast Cancer  . PONV (postoperative nausea and vomiting)     Past Surgical History:  Procedure Laterality Date  . BREAST LUMPECTOMY Left 07/12/2018  . BREAST LUMPECTOMY WITH RADIOACTIVE SEED AND AXILLARY LYMPH NODE DISSECTION Left 07/12/2018   Procedure: LEFT BREAST RADIOACTIVE SEED X 3 LUMPECTOMY X 3  AND AXILLARY LYMPH NODE DISSECTION;  Surgeon: Jovita Kussmaul, MD;  Location: [facility];  Service: General;  Laterality: Left;  . PORT-A-CATH REMOVAL Right  04/01/2019   Procedure: REMOVAL PORT-A-CATH;  Surgeon: Jovita Kussmaul, MD;  Location: [facility];  Service: General;  Laterality: Right;  . PORTACATH PLACEMENT Right 03/03/2018   Procedure: INSERTION PORT-A-CATH;  Surgeon: Jovita Kussmaul, MD;  Location: [facility];  Service: General;  Laterality: Right;  . ROTATOR CUFF REPAIR Right 2006  . TOTAL ABDOMINAL HYSTERECTOMY  2010    There were no vitals filed for this visit.  Subjective Assessment - 05/03/19 0846    Subjective  Doing pretty good. No issues since last time. Feels like the anterior shoulder pain has resolved, but starting to notice posterior shoulder pain. Noting 85% improvement in L shoulder since initial eval. Still having difficulty with behind the back stretch. Notes good benefit from tape and with no irritation.    Pertinent History  GERD, L breast CA with lumpectomy, radiation, & chemo, R RTC repair 2010    Diagnostic tests  11/02/18 L shoulder MRI: Focal high-grade partial-thickness bursal surface tear of the distal supraspinatus tendon at the insertion. No full-thicknesstear. Mild subacromial/subdeltoid bursitis. Mild AC OA.    Patient Stated Goals  "be able to scratch by back"    Currently in Pain?  No/denies         [facility] PT Assessment -  05/03/19 0001      Observation/Other Assessments   Focus on Therapeutic Outcomes (FOTO)   Shoulder: 75 (25% limited, 28% predicted)      AROM   Left Shoulder Flexion  134 Degrees    Left Shoulder ABduction  144 Degrees    Left Shoulder Internal Rotation  --   FIR T11   Left Shoulder External Rotation  --   FER C7, using hand- T3     Strength   Right Shoulder Flexion  --    Right Shoulder ABduction  --    Right Shoulder Internal Rotation  --    Right Shoulder External Rotation  --    Left Shoulder Flexion  4+/5    Left Shoulder ABduction  4+/5    Left Shoulder Internal Rotation  4+/5    Left Shoulder External Rotation  4+/5               [facility] Adult PT Treatment/Exercise - 05/03/19 0001      Shoulder Exercises: ROM/Strengthening   UBE (Upper Arm Bike)  L1 x 3 min forward/ 3 min backward      Shoulder Exercises: Stretch   External Rotation Stretch  5 reps   L ER stretch with strap 5x5" to tolerance   Other Shoulder Stretches  L ER stretch in sitting with UE resting on mat table 5x5"      Manual Therapy   Manual Therapy  Passive ROM;Taping    Manual therapy comments  supine    Passive ROM  L shoulder PROM in all directions   mild pain at end ranges     Kinesiotix   Create Space  2 strips at 50% stretch along L lats             PT Education - 05/03/19 1219    Education Details  update to HEP; discussion on objective progress and remaining impairments    Person(s) Educated  Patient    Methods  Explanation;Demonstration;Tactile cues;Verbal cues;Handout    Comprehension  Verbalized understanding;Returned demonstration       PT Short Term Goals - 05/03/19 0850      PT SHORT TERM GOAL #1   Title  Patient to be indpendent with initial HEP.    Time  3    Period  Weeks    Status  Achieved    Target Date  03/15/19        PT Long Term Goals - 05/03/19 0850      PT LONG TERM GOAL #1   Title  Patient to be independent with advanced HEP.    Time  6    Period  Weeks    Status  Partially Met   met for current, compliance sometimes limited by fatigue     PT LONG TERM GOAL #2   Title  Patient to demonstrate L shoulder AROM WFL and without pain limiting.    Time  6    Period  Weeks    Status  On-going   improved flexion, abduction, IR     PT LONG TERM GOAL #3   Title  Patient to demonstrate L shoulder strength >/=4+/5.    Time  6    Period  Weeks    Status  Achieved      PT LONG TERM GOAL #4   Title  Patient to report 75% improvement in ability to reach behind her back.    Time  6    Period  Weeks  Status  Achieved   notes 75% improvement in behind back motion            Plan - 05/03/19 1220    Clinical Impression Statement  Patient reporting 85% improvement in L shoulder since initial eval. Notes that she still has difficulty with activities that require reaching behind her back. However, does report 75% improvement in this movement so far. Now able to reach towards the back seat of her car without pain. Patient has met strength goal at this time, with all muscle groups now reaching 4+/5. Has also met subjective goal for behind the back reaching into IR. Still demonstrating limitation in R shoulder AROM compared to opposite extremity. Patient reported discomfort with pec and ER stretching in doorway, this initiated modified versions of shoulder ER stretching which were better tolerated. Ended session with KT tape to L lats as patient reported relief with this modality. No complaints at end of session. Patient has met or partially met goals at this time. Would continue to benefit from skilled PT services to address remaining goals and return to PLOF.    Comorbidities  GERD, L breast CA with lumpectomy, radiation, & chemo, R RTC repair 2010    Rehab Potential  Good    PT Treatment/Interventions  ADLs/Self Care Home Management;Cryotherapy;Moist Heat;Therapeutic exercise;Therapeutic activities;Functional mobility training;Neuromuscular re-education;Patient/family education;Manual techniques;Taping;Splinting;Energy conservation;Dry needling;Passive range of motion;Scar mobilization    PT Next Visit Plan  AROM, strengthening, IR stretching to tolerance    Consulted and Agree with Plan of Care  Patient       Patient will benefit from skilled therapeutic intervention in order to improve the following deficits and impairments:  Hypomobility, Decreased activity tolerance, Decreased strength, Increased fascial restricitons, Impaired UE functional use, Pain, Improper body mechanics, Decreased range of motion, Postural dysfunction  Visit Diagnosis: Chronic left  shoulder pain  Stiffness of left shoulder, not elsewhere classified  Other symptoms and signs involving the musculoskeletal system     Problem List Patient Active Problem List   Diagnosis Date Noted  . Carcinoma of upper-outer quadrant of left breast in female, estrogen receptor negative ([place]) 07/12/2018  . Seizure-like activity ([facility])   . Hyponatremia   . Recurrent syncope 03/08/2018  . LFT elevation   . Seizure ([facility])   . Thrombocytopenia ([facility])   . Port-A-Cath in place 03/04/2018  . Genetic testing 02/23/2018  . Family history of breast cancer   . Family history of prostate cancer   . Family history of ovarian cancer   . Malignant neoplasm of left female breast ([place]) 02/03/2018  . Prediabetes 01/06/2018  . Reactive airway disease without complication 57/32/2567  . Class 1 obesity with serious comorbidity and body mass index (BMI) of 32.0 to 32.9 in adult 12/22/2017    Janene Harvey, PT, DPT 05/03/19 12:23 PM   [facility] [place] [address]  [place], [place], [address] Phone: [phone]   Fax:  [phone]  Name: Christy Serrano MRN: [id number] Date of Birth: 07-15-1955

## 2019-05-09 ENCOUNTER — Ambulatory Visit: Payer: 59 | Admitting: Hematology and Oncology

## 2019-05-10 ENCOUNTER — Other Ambulatory Visit: Payer: Self-pay

## 2019-05-10 ENCOUNTER — Encounter: Payer: Self-pay | Admitting: Physical Therapy

## 2019-05-10 ENCOUNTER — Ambulatory Visit: Payer: Managed Care, Other (non HMO) | Admitting: Physical Therapy

## 2019-05-10 DIAGNOSIS — R29898 Other symptoms and signs involving the musculoskeletal system: Secondary | ICD-10-CM

## 2019-05-10 DIAGNOSIS — M25612 Stiffness of left shoulder, not elsewhere classified: Secondary | ICD-10-CM

## 2019-05-10 DIAGNOSIS — G8929 Other chronic pain: Secondary | ICD-10-CM

## 2019-05-10 DIAGNOSIS — M25512 Pain in left shoulder: Secondary | ICD-10-CM | POA: Diagnosis not present

## 2019-05-10 NOTE — Therapy (Addendum)
[facility] [address]  [place], [place], [address] Phone: [phone]   Fax:  [phone]  Physical Therapy Treatment  Patient Details  Name: Christy Serrano MRN: [id number] Date of Birth: April 11, 1955 Referring Provider (PT): Meredith Pel, MD   Encounter Date: 05/10/2019  PT End of Session - 05/10/19 1124    Visit Number  11    Number of Visits  13    Date for PT Re-Evaluation  04/05/19    Authorization Type  [employer]    PT Start Time  0935    PT Stop Time  1016    PT Time Calculation (min)  41 min    Activity Tolerance  Patient tolerated treatment well;Patient limited by pain    Behavior During Therapy  [facility] for tasks assessed/performed       Past Medical History:  Diagnosis Date  . Breast cancer ([place]) 2020   Left Breast Cancer  . Cancer ([place])    left breast  . Chronic bronchitis ([place])   . Family history of breast cancer   . Family history of ovarian cancer   . Family history of prostate cancer   . History of radiation therapy 09/02/18- 10/14/18   Left Breast 25 fx for a total of 50 Gy, Left SCV, PA 25 fractions for a total of 50 Gy, Left Breast boost 5 fractions, for a total of 10 Gy.   . Lactose intolerance   . Personal history of chemotherapy 2020   Left Breast Cancer  . Personal history of radiation therapy 2020   Left Breast Cancer  . PONV (postoperative nausea and vomiting)     Past Surgical History:  Procedure Laterality Date  . BREAST LUMPECTOMY Left 07/12/2018  . BREAST LUMPECTOMY WITH RADIOACTIVE SEED AND AXILLARY LYMPH NODE DISSECTION Left 07/12/2018   Procedure: LEFT BREAST RADIOACTIVE SEED X 3 LUMPECTOMY X 3  AND AXILLARY LYMPH NODE DISSECTION;  Surgeon: Jovita Kussmaul, MD;  Location: [facility];  Service: General;  Laterality: Left;  . PORT-A-CATH REMOVAL Right 04/01/2019   Procedure: REMOVAL PORT-A-CATH;  Surgeon: Jovita Kussmaul, MD;  Location: [facility];   Service: General;  Laterality: Right;  . PORTACATH PLACEMENT Right 03/03/2018   Procedure: INSERTION PORT-A-CATH;  Surgeon: Jovita Kussmaul, MD;  Location: [facility];  Service: General;  Laterality: Right;  . ROTATOR CUFF REPAIR Right 2006  . TOTAL ABDOMINAL HYSTERECTOMY  2010    There were no vitals filed for this visit.  Subjective Assessment - 05/10/19 0935    Subjective  Feels that the KT tape has contributed to her L shoulder becoming more still lately. Feels that she would still like to wrap up with PT today. Still has not been able to find a shoulder pulley.    Pertinent History  GERD, L breast CA with lumpectomy, radiation, & chemo, R RTC repair 2010    Diagnostic tests  11/02/18 L shoulder MRI: Focal high-grade partial-thickness bursal surface tear of the distal supraspinatus tendon at the insertion. No full-thicknesstear. Mild subacromial/subdeltoid bursitis. Mild AC OA.    Patient Stated Goals  "be able to scratch by back"    Currently in Pain?  No/denies         [facility] PT Assessment - 05/10/19 0001      Observation/Other Assessments   Focus on Therapeutic Outcomes (FOTO)   Shoulder: 75 (25% limited, 28% predicted)      AROM  Left Shoulder Flexion  134 Degrees    Left Shoulder ABduction  144 Degrees    Left Shoulder Internal Rotation  --   FIR T11   Left Shoulder External Rotation  --   FER C7, using hand T3     Strength   Left Shoulder Flexion  4+/5    Left Shoulder ABduction  4+/5    Left Shoulder Internal Rotation  4+/5    Left Shoulder External Rotation  4+/5               [facility] Adult PT Treatment/Exercise - 05/10/19 0001      Shoulder Exercises: Sidelying   ABduction  Strengthening;Left;Weights;10 reps    ABduction Weight (lbs)  0, 1    ABduction Limitations  10x each; thumb up; to tolerance   c/o nonpainful cavitation at ~90 deg     Shoulder Exercises: Pulleys   Flexion  3 minutes    Flexion Limitations  to tolerance     Scaption  3 minutes    Scaption Limitations  to tolerance      Manual Therapy   Manual Therapy  Soft tissue mobilization;Myofascial release;Passive ROM    Manual therapy comments  supine    Soft tissue mobilization  STM to L latissimus- large tender trigger point evident near insertion on humerus    Myofascial Release  manual TPR to L lats- tenderness    Passive ROM  L shoulder PROM in all directions to tolerance; L lat stretch with stabilization at scapula 2x30" to tolerance, sidelying shoulder abduction PROM with prolonged holds at end range    pain at end range abduction            PT Education - 05/10/19 1123    Education Details  review and consolidation of HEP; discussion on remaining impairments and plan to address them at home    Person(s) Educated  Patient    Methods  Explanation;Demonstration;Tactile cues;Verbal cues;Handout    Comprehension  Verbalized understanding;Returned demonstration       PT Short Term Goals - 05/10/19 1125      PT SHORT TERM GOAL #1   Title  Patient to be indpendent with initial HEP.    Time  3    Period  Weeks    Status  Achieved    Target Date  03/15/19        PT Long Term Goals - 05/10/19 1125      PT LONG TERM GOAL #1   Title  Patient to be independent with advanced HEP.    Time  6    Period  Weeks    Status  Achieved      PT LONG TERM GOAL #2   Title  Patient to demonstrate L shoulder AROM WFL and without pain limiting.    Time  6    Period  Weeks    Status  Partially Met   improved flexion, abduction, IR     PT LONG TERM GOAL #3   Title  Patient to demonstrate L shoulder strength >/=4+/5.    Time  6    Period  Weeks    Status  Achieved      PT LONG TERM GOAL #4   Title  Patient to report 75% improvement in ability to reach behind her back.    Time  6    Period  Weeks    Status  Achieved   notes 75% improvement in behind back motion  Plan - 05/10/19 1131    Clinical Impression Statement   Patient reporting increased L shoulder stiffness from the last couple of days. Attributes this to last KT tape application. Patient noting that she would still like to wrap up with PT today as she is comfortable with her HEP and feels able to continue addressing her remaining impairments at home. Worked on manual therapy for improvement in L shoulder PROM and soft tissue restriction. Patient demonstrated large tender trigger point in L latissimus near insertion on humerus which was addressed with STM and TPR. Patient reported good relief after manual therapy Continued with sidelying abduction with and without weight for improvement in strength and ROM. Patient noted nonpainful cavitation in L shoulder intermittently, but able to continue. Reviewed and consolidated exercises for max benefit at home. Patient reported understanding of HEP update. Patient has met or partially met all goals at this time and is now on 30 day hold per her request.    Comorbidities  GERD, L breast CA with lumpectomy, radiation, & chemo, R RTC repair 2010    Rehab Potential  Good    PT Treatment/Interventions  ADLs/Self Care Home Management;Cryotherapy;Moist Heat;Therapeutic exercise;Therapeutic activities;Functional mobility training;Neuromuscular re-education;Patient/family education;Manual techniques;Taping;Splinting;Energy conservation;Dry needling;Passive range of motion;Scar mobilization    PT Next Visit Plan  30 dyay hold at this time    Consulted and Agree with Plan of Care  Patient       Patient will benefit from skilled therapeutic intervention in order to improve the following deficits and impairments:  Hypomobility, Decreased activity tolerance, Decreased strength, Increased fascial restricitons, Impaired UE functional use, Pain, Improper body mechanics, Decreased range of motion, Postural dysfunction  Visit Diagnosis: Chronic left shoulder pain  Stiffness of left shoulder, not elsewhere classified  Other symptoms  and signs involving the musculoskeletal system     Problem List Patient Active Problem List   Diagnosis Date Noted  . Carcinoma of upper-outer quadrant of left breast in female, estrogen receptor negative ([place]) 07/12/2018  . Seizure-like activity ([place])   . Hyponatremia   . Recurrent syncope 03/08/2018  . LFT elevation   . Seizure ([place])   . Thrombocytopenia ([place])   . Port-A-Cath in place 03/04/2018  . Genetic testing 02/23/2018  . Family history of breast cancer   . Family history of prostate cancer   . Family history of ovarian cancer   . Malignant neoplasm of left female breast ([place]) 02/03/2018  . Prediabetes 01/06/2018  . Reactive airway disease without complication 31/59/4585  . Class 1 obesity with serious comorbidity and body mass index (BMI) of 32.0 to 32.9 in adult 12/22/2017     Janene Harvey, PT, DPT 05/10/19 11:33 AM   [facility] [address]  [place], [place], [address] Phone: [phone]   Fax:  [phone]  Name: Christy Serrano MRN: [id number] Date of Birth: 1956/01/27    PHYSICAL THERAPY DISCHARGE SUMMARY  Visits from Start of Care: 11  Current functional level related to goals / functional outcomes: See above clinical impression; patient did not return after 30 day hold    Remaining deficits: Decreased shoulder ROM   Education / Equipment: HEP  Plan: Patient agrees to discharge.  Patient goals were partially met. Patient is being discharged due to being pleased with the current functional level.  ?????      Janene Harvey, PT, DPT 06/15/19 2:35 PM

## 2019-05-11 ENCOUNTER — Other Ambulatory Visit: Payer: Self-pay | Admitting: *Deleted

## 2019-05-11 ENCOUNTER — Encounter: Payer: Self-pay | Admitting: *Deleted

## 2019-05-11 DIAGNOSIS — Z171 Estrogen receptor negative status [ER-]: Secondary | ICD-10-CM

## 2019-05-11 DIAGNOSIS — C50412 Malignant neoplasm of upper-outer quadrant of left female breast: Secondary | ICD-10-CM

## 2019-05-11 NOTE — Progress Notes (Signed)
Pt requesting to have lab work drawn at [facility] in [place].  Orders successfully faxed to the number provided by the pt [phone]).

## 2019-05-12 ENCOUNTER — Ambulatory Visit: Payer: 59 | Admitting: Hematology and Oncology

## 2019-05-23 NOTE — Progress Notes (Signed)
 Patient Care Team: Holwerda, Scott, MD as PCP - General (Internal Medicine) Toth, Paul III, MD as Consulting Physician (General Surgery) Jonmichael Beadnell, MD as Consulting Physician (Hematology and Oncology) Squire, Sarah, MD as Attending Physician (Radiation Oncology) Bensimhon, Daniel R, MD as Consulting Physician (Cardiology)  DIAGNOSIS:    ICD-10-CM   1. Malignant neoplasm of upper-outer quadrant of left breast in female, estrogen receptor negative (HCC)  C50.412    Z17.1     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 breast (HCC)  01/18/2018 Initial Diagnosis   Screening mammogram detected microcalcifications and asymmetry, left breast segmental microcalcifications UOQ 1.5 x 4.3 x 5.4 cm biopsy-proven IDC with DCIS with LV I, grade 3, ER 0%, PR 0%, Ki-67 30%, HER-2 +3+ by IHC; hypoechoic mass 2 o'clock position 1.2 x 1.3 x 1.7 cm biopsy IDC, LV I present, grade 2-3, ER 0%, PR 0%, Ki-67 40%, HER-2 +3+, left axillary lymph node positive (3 nodes noted by US) T3 N1 stage IIIA   02/03/2018 Cancer Staging   Staging form: Breast, AJCC 8th Edition - Clinical: Stage IIIA (cT3, cN1, cM0, G3, ER-, PR-, HER2+) - Signed by Sonjia Wilcoxson, MD on 02/03/2018   02/20/2018 Genetic Testing   No pathogenic variants identified on the [employer] CancerNext Expanded + RNA insight panel. Genes Analyzed (67 total): AIP, ALK, APC*, ATM*, BAP1, BARD1, BLM, BMPR1A, BRCA1*, BRCA2*, BRIP1*, CDH1*, CDK4, CDKN1B, CDKN2A, CHEK2*, DICER1, FANCC, FH, FLCN, GALNT12, HOXB13, MAX, MEN1, MET, MLH1*, MRE11A, MSH2*, MSH6*, MUTYH*, NBN, NF1*, NF2, PALB2*, PHOX2B, PMS2*, POLD1, POLE, POT1, PRKAR1A, PTCH1, PTEN*, RAD50, RAD51C*, RAD51D*, RB1, RET, SDHA, SDHAF2, SDHB, SDHC, SDHD, SMAD4, SMARCA4, SMARCB1, SMARCE1, STK11, SUFU, TMEM127, TP53*, TSC1, TSC2, VHL and XRCC2 (sequencing and deletion/duplication); MITF (sequencing only); EPCAM and GREM1 (deletion/duplication only). DNA and RNA analyses  performed for * genes. The report date is 02/20/2018.   03/04/2018 -  Neo-Adjuvant Chemotherapy   Neoadjuvant chemotherapy with TCH Perjeta   03/08/2018 - 03/10/2018 Hospital Admission   Admitted for recurrent syncope with loss of consciousness for up to 5 minutes with loss of bladder, no abnormalities identified.  MRI brain, carotid ultrasound, EEG studies were negative.   06/17/2018 Breast MRI   No suspicious residual enhancement identified in the left breast, compatible with treatment response, interval decrease in size of left axillary lymph nodes   07/12/2018 Surgery   Lumpectomy (Dr. Toth): complete treatment response, no residual carcinoma present in breast or 11/11 lymph nodes.    09/02/2018 - 10/14/2018 Radiation Therapy   Adjuvant XRT   Carcinoma of upper-outer quadrant of left breast in female, estrogen receptor negative (HCC)  07/12/2018 Initial Diagnosis   Carcinoma of upper-outer quadrant of left breast in female, estrogen receptor negative (HCC)   08/11/2018 Cancer Staging   Staging form: Breast, AJCC 8th Edition - Pathologic: No Stage Recommended (ypT0, pN0) - Signed by Squire, Sarah, MD on 08/11/2018   08/11/2018 Cancer Staging   Staging form: Breast, AJCC 8th Edition - Clinical: Stage IIIA (cT3, cN1, cM0, G3, ER-, PR-, HER2+) - Signed by Squire, Sarah, MD on 08/11/2018   08/20/2018 -  Chemotherapy   The patient had trastuzumab (HERCEPTIN) 450 mg in sodium chloride 0.9 % 250 mL chemo infusion, 462 mg (100 % of original dose 6 mg/kg), Intravenous,  Once, 9 of 9 cycles Dose modification: 6 mg/kg (original dose 6 mg/kg, Cycle 1, Reason: Provider Judgment), 450 mg (original dose 450 mg, Cycle 6, Reason: Other (see comments), Comment: pt assist for   Herceptin) Administration: 450 mg (08/20/2018), 450 mg (09/10/2018), 450 mg (10/01/2018), 450 mg (10/22/2018), 450 mg (12/03/2018), 450 mg (12/24/2018), 450 mg (01/14/2019), 450 mg (02/04/2019), 450 mg (02/25/2019) pertuzumab (PERJETA) 420 mg  in sodium chloride 0.9 % 250 mL chemo infusion, 420 mg (100 % of original dose 420 mg), Intravenous, Once, 10 of 10 cycles Dose modification: 420 mg (original dose 420 mg, Cycle 1, Reason: Provider Judgment) Administration: 420 mg (08/20/2018), 420 mg (09/10/2018), 420 mg (11/12/2018), 420 mg (10/01/2018), 420 mg (10/22/2018), 420 mg (12/03/2018), 420 mg (12/24/2018), 420 mg (01/14/2019), 420 mg (02/04/2019), 420 mg (02/25/2019) trastuzumab-dkst (OGIVRI) 450 mg in sodium chloride 0.9 % 250 mL chemo infusion, 450 mg (100 % of original dose 450 mg), Intravenous,  Once, 2 of 2 cycles Dose modification: 450 mg (original dose 450 mg, Cycle 5, Reason: Other (see comments), Comment: Biosimilar Conversion), 6 mg/kg (original dose 6 mg/kg, Cycle 7, Reason: Other (see comments), Comment: to biosimalar) Administration: 450 mg (11/12/2018)  for chemotherapy treatment.      CHIEF COMPLIANT: Follow-up to discuss further treatment  INTERVAL HISTORY: Christy Serrano is a 64 y.o. with above-mentioned history of left breast cancer treated with neoadjuvant chemotherapy,lumpectomy, radiation, Herceptin Perjeta maintenance.Echo on 03/21/19 showed an ejection fraction of 60-65%. She presents to the clinic todayto discuss neratinib treatment.  ALLERGIES:  is allergic to gabapentin; amoxicillin; and oxycodone.  MEDICATIONS:  Current Outpatient Medications  Medication Sig Dispense Refill  . acetaminophen (TYLENOL) 500 MG tablet Take 1,000 mg by mouth every 6 (six) hours as needed for mild pain.    Marland Kitchen albuterol (PROVENTIL HFA;VENTOLIN HFA) 108 (90 Base) MCG/ACT inhaler Inhale into the lungs every 6 (six) hours as needed for wheezing or shortness of breath.    . budesonide-formoterol (SYMBICORT) 160-4.5 MCG/ACT inhaler Inhale 2 puffs into the lungs 2 (two) times daily.    . Calcium-Phosphorus-Vitamin D (CITRACAL +D3 PO) Take 1,200 mcg by mouth.    . cholecalciferol (VITAMIN D) 1000 units tablet Take 1,000 Units by mouth  daily.    Marland Kitchen estradiol (VIVELLE-DOT) 0.05 MG/24HR patch     . gabapentin (NEURONTIN) 100 MG capsule Take 2 capsules (200 mg total) by mouth daily. 90 capsule 3  . HYDROcodone-acetaminophen (NORCO/VICODIN) 5-325 MG tablet Take 1 tablet by mouth every 6 (six) hours as needed for moderate pain or severe pain. 10 tablet 0  . methocarbamol (ROBAXIN) 500 MG tablet 1 po q 8 hrs prn 30 tablet 0  . Multiple Vitamin (MULTIVITAMIN) capsule Take 1 capsule by mouth daily.    Marland Kitchen spironolactone (ALDACTONE) 25 MG tablet Take 0.5 tablets (12.5 mg total) by mouth daily. 45 tablet 3   No current facility-administered medications for this visit.    PHYSICAL EXAMINATION: ECOG PERFORMANCE STATUS: 1 - Symptomatic but completely ambulatory  Vitals:   05/24/19 1507  BP: 135/74  Pulse: 65  Resp: 16  Temp: 98.5 F (36.9 C)  SpO2: 100%   Filed Weights   05/24/19 1507  Weight: 179 lb 6.4 oz (81.4 kg)    LABORATORY DATA:  I have reviewed the data as listed CMP Latest Ref Rng & Units 03/29/2019 02/25/2019 02/04/2019  Glucose 70 - 99 mg/dL 77 84 85  BUN 8 - 23 mg/dL '14 19 17  '$ Creatinine 0.44 - 1.00 mg/dL 1.18(H) 1.07(H) 1.05(H)  Sodium 135 - 145 mmol/L 138 140 139  Potassium 3.5 - 5.1 mmol/L 4.5 4.3 4.3  Chloride 98 - 111 mmol/L 104 105 104  CO2 22 - 32 mmol/L 24 26  27  Calcium 8.9 - 10.3 mg/dL 9.1 8.9 9.5  Total Protein 6.5 - 8.1 g/dL - 7.1 7.6  Total Bilirubin 0.3 - 1.2 mg/dL - 0.6 0.5  Alkaline Phos 38 - 126 U/L - 80 89  AST 15 - 41 U/L - 21 17  ALT 0 - 44 U/L - 19 13    Lab Results  Component Value Date   WBC 3.9 (L) 05/24/2019   HGB 11.8 (L) 05/24/2019   HCT 37.4 05/24/2019   MCV 96.1 05/24/2019   PLT 193 05/24/2019   NEUTROABS 2.2 05/24/2019    ASSESSMENT & PLAN:  Malignant neoplasm of left female breast ([place]) 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,  grade 3, ER 0%, PR 0%, Ki-67 30%, HER-2 +3+ by IHC; hypoechoic mass 2 o'clock position 1.2 x 1.3 x 1.7 cm biopsy IDC, LV I present, grade 2-3, ER 0%, PR 0%, Ki-67 40%, HER-2 +3+, left axillary lymph node positive (3 nodes noted by [place]) T3 N1 stage IIIA  Treatment plan 1.Neoadjuvant chemotherapy with [place] completed 3/26/2020followed by Herceptin Perjeta maintenance completed December 2020 2.lumpectomywith axillary lymphnode dissection: 07/12/2018 (Dr. Marlou Starks): complete treatment response, no residual carcinoma present in breast or 11/11 lymph nodes. 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 Discussed the pros and cons of neratinib and decided not to pursue it. Breast cancer surveillance: Mammogram 01/07/2019: Benign  Return to clinic in 6 months for follow-up.    No orders of the defined types were placed in this encounter.  The patient has a good understanding of the overall plan. she agrees with it. she will call with any problems that may develop before the next visit here.  Total time spent: 30 mins including face to face time and time spent for planning, charting and coordination of care  Nicholas Lose, MD 05/24/2019  I, Cloyde Reams Dorshimer, am acting as scribe for Dr. Nicholas Lose.  I have reviewed the above documentation for accuracy and completeness, and I agree with the above.

## 2019-05-24 ENCOUNTER — Other Ambulatory Visit: Payer: Self-pay

## 2019-05-24 ENCOUNTER — Inpatient Hospital Stay: Payer: Managed Care, Other (non HMO) | Attending: Hematology and Oncology | Admitting: Hematology and Oncology

## 2019-05-24 ENCOUNTER — Inpatient Hospital Stay: Payer: Managed Care, Other (non HMO)

## 2019-05-24 DIAGNOSIS — Z923 Personal history of irradiation: Secondary | ICD-10-CM | POA: Insufficient documentation

## 2019-05-24 DIAGNOSIS — Z171 Estrogen receptor negative status [ER-]: Secondary | ICD-10-CM | POA: Insufficient documentation

## 2019-05-24 DIAGNOSIS — C50412 Malignant neoplasm of upper-outer quadrant of left female breast: Secondary | ICD-10-CM | POA: Diagnosis not present

## 2019-05-24 DIAGNOSIS — Z79899 Other long term (current) drug therapy: Secondary | ICD-10-CM | POA: Diagnosis not present

## 2019-05-24 DIAGNOSIS — Z9221 Personal history of antineoplastic chemotherapy: Secondary | ICD-10-CM | POA: Diagnosis not present

## 2019-05-24 LAB — CMP (CANCER CENTER ONLY)
ALT: 12 U/L (ref 0–44)
AST: 17 U/L (ref 15–41)
Albumin: 3.9 g/dL (ref 3.5–5.0)
Alkaline Phosphatase: 72 U/L (ref 38–126)
Anion gap: 6 (ref 5–15)
BUN: 19 mg/dL (ref 8–23)
CO2: 28 mmol/L (ref 22–32)
Calcium: 9.5 mg/dL (ref 8.9–10.3)
Chloride: 104 mmol/L (ref 98–111)
Creatinine: 1.19 mg/dL — ABNORMAL HIGH (ref 0.44–1.00)
GFR, Est AFR Am: 57 mL/min — ABNORMAL LOW (ref >60)
GFR, Estimated: 49 mL/min — ABNORMAL LOW (ref >60)
Glucose, Bld: 127 mg/dL — ABNORMAL HIGH (ref 70–99)
Potassium: 3.9 mmol/L (ref 3.5–5.1)
Sodium: 138 mmol/L (ref 135–145)
Total Bilirubin: 0.6 mg/dL (ref 0.3–1.2)
Total Protein: 7.5 g/dL (ref 6.5–8.1)

## 2019-05-24 LAB — CBC WITH DIFFERENTIAL (CANCER CENTER ONLY)
Abs Immature Granulocytes: 0.01 10*3/uL (ref 0.00–0.07)
Basophils Absolute: 0 10*3/uL (ref 0.0–0.1)
Basophils Relative: 0 %
Eosinophils Absolute: 0 10*3/uL (ref 0.0–0.5)
Eosinophils Relative: 1 %
HCT: 37.4 % (ref 36.0–46.0)
Hemoglobin: 11.8 g/dL — ABNORMAL LOW (ref 12.0–15.0)
Immature Granulocytes: 0 %
Lymphocytes Relative: 36 %
Lymphs Abs: 1.4 10*3/uL (ref 0.7–4.0)
MCH: 30.3 pg (ref 26.0–34.0)
MCHC: 31.6 g/dL (ref 30.0–36.0)
MCV: 96.1 fL (ref 80.0–100.0)
Monocytes Absolute: 0.3 10*3/uL (ref 0.1–1.0)
Monocytes Relative: 6 %
Neutro Abs: 2.2 10*3/uL (ref 1.7–7.7)
Neutrophils Relative %: 57 %
Platelet Count: 193 10*3/uL (ref 150–400)
RBC: 3.89 MIL/uL (ref 3.87–5.11)
RDW: 14.8 % (ref 11.5–15.5)
WBC Count: 3.9 10*3/uL — ABNORMAL LOW (ref 4.0–10.5)
nRBC: 0 % (ref 0.0–0.2)

## 2019-05-24 NOTE — Assessment & Plan Note (Signed)
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, grade 3, ER 0%, PR 0%, Ki-67 30%, HER-2 +3+ by IHC; hypoechoic mass 2 o'clock position 1.2 x 1.3 x 1.7 cm biopsy IDC, LV I present, grade 2-3, ER 0%, PR 0%, Ki-67 40%, HER-2 +3+, left axillary lymph node positive (3 nodes noted by [place]) T3 N1 stage IIIA  Treatment plan 1.Neoadjuvant chemotherapy with [place] completed 3/26/2020followed by Herceptin Perjeta maintenance completed December 2020 2.lumpectomywith axillary lymphnode dissection: 07/12/2018 (Dr. Marlou Starks): complete treatment response, no residual carcinoma present in breast or 11/11 lymph nodes. 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 Breast cancer surveillance: Mammogram 01/07/2019: Benign  Return to clinic in 1 year for follow-up.

## 2019-05-25 ENCOUNTER — Telehealth: Payer: Self-pay | Admitting: Hematology and Oncology

## 2019-05-25 NOTE — Telephone Encounter (Signed)
I talk with patient regarding schedule  

## 2019-05-30 ENCOUNTER — Ambulatory Visit: Payer: 59 | Attending: Internal Medicine

## 2019-05-30 ENCOUNTER — Encounter: Payer: Self-pay | Admitting: Hematology and Oncology

## 2019-05-30 DIAGNOSIS — Z23 Encounter for immunization: Secondary | ICD-10-CM | POA: Insufficient documentation

## 2019-05-30 NOTE — Progress Notes (Signed)
   Covid-19 Vaccination Clinic  Name:  Christy Serrano    MRN: [id number] DOB: [birth year]  05/30/2019  [name] was observed post Covid-19 immunization for 15 minutes without incident. She was provided with Vaccine Information Sheet and instruction to access the V-Safe system.   [name] was instructed to call [phone] with any severe reactions post vaccine: Marland Kitchen Difficulty breathing  . Swelling of face and throat  . A fast heartbeat  . A bad rash all over body  . Dizziness and weakness   Immunizations Administered    Name Date Dose VIS Date Route   Pfizer COVID-19 Vaccine 05/30/2019  6:40 PM 0.3 mL 03/04/2019 Intramuscular   Manufacturer: [place]   Lot: [id number]   [place]: [id number]

## 2019-06-07 ENCOUNTER — Ambulatory Visit (INDEPENDENT_AMBULATORY_CARE_PROVIDER_SITE_OTHER): Payer: Managed Care, Other (non HMO) | Admitting: Bariatrics

## 2019-06-07 ENCOUNTER — Other Ambulatory Visit: Payer: Self-pay

## 2019-06-07 ENCOUNTER — Encounter (INDEPENDENT_AMBULATORY_CARE_PROVIDER_SITE_OTHER): Payer: Self-pay | Admitting: Bariatrics

## 2019-06-07 VITALS — BP 110/63 | HR 59 | Temp 98.0°F | Ht 65.0 in | Wt 177.0 lb

## 2019-06-07 DIAGNOSIS — J45909 Unspecified asthma, uncomplicated: Secondary | ICD-10-CM | POA: Diagnosis not present

## 2019-06-07 DIAGNOSIS — Z6830 Body mass index (BMI) 30.0-30.9, adult: Secondary | ICD-10-CM | POA: Diagnosis not present

## 2019-06-07 DIAGNOSIS — E669 Obesity, unspecified: Secondary | ICD-10-CM

## 2019-06-07 DIAGNOSIS — R7303 Prediabetes: Secondary | ICD-10-CM

## 2019-06-07 NOTE — Progress Notes (Signed)
Chief Complaint:   OBESITY Christy Serrano is here to discuss her progress with her obesity treatment plan along with follow-up of her obesity related diagnoses. Christy Serrano is on the Category 2 Plan and states she is following her eating plan approximately 25% of the time. Christy Serrano states she is walking 15 minutes 3 times per week.  Today's visit was #: 20 Starting weight: 195 lbs Starting date: 12/22/2017 Today's weight: 177 lbs Today's date: 06/07/2019 Total lbs lost to date: 18 Total lbs lost since last in-office visit: 5  Interim History: Christy Serrano is up 5 lbs, but has done well overall. She feels that she got out of routine. She is struggling with water.  Subjective:   Prediabetes. Christy Serrano has a diagnosis of prediabetes based on her elevated HgA1c and was informed this puts her at greater risk of developing diabetes. She continues to work on diet and exercise to decrease her risk of diabetes. She denies nausea or hypoglycemia. No polyphagia. Last glucose 127 on 05/24/2019.  Lab Results  Component Value Date   HGBA1C 5.7 (H) 10/20/2018   Lab Results  Component Value Date   INSULIN 4.8 05/31/2018   INSULIN 13.7 12/22/2017   Reactive airway disease without complication, unspecified asthma severity, unspecified whether persistent. Christy Serrano is taking Ventolin and Symbicort. She reports having no issues and rarely uses an inhaler.  Assessment/Plan:   Prediabetes. Christy Serrano will continue to work on weight loss, exercise, increasing healthy fats and protein, and decreasing simple carbohydrates to help decrease the risk of diabetes.   Reactive airway disease without complication, unspecified asthma severity, unspecified whether persistent. Christy Serrano will gradually increase her activity.  Class 1 obesity with serious comorbidity and body mass index (BMI) of 30.0 to 30.9 in adult, unspecified obesity type - BMI greater than 30 at start of program.   Christy Serrano is currently in the  action stage of change. As such, her goal is to continue with weight loss efforts. She has agreed to the Category 2 Plan.   She will work on meal planning, will take water to work, and will increase her protein intake.  Exercise goals: Christy Serrano is walking for 15 minutes 3 times per week and is walking at work.  Behavioral modification strategies: increasing lean protein intake, decreasing simple carbohydrates, increasing vegetables, increasing water intake, decreasing eating out, no skipping meals, meal planning and cooking strategies and keeping healthy foods in the home.  Christy Serrano has agreed to follow-up with our clinic in 4 weeks. She was informed of the importance of frequent follow-up visits to maximize her success with intensive lifestyle modifications for her multiple health conditions.   Objective:   Blood pressure 110/63, pulse (!) 59, temperature 98 F (36.7 C), height 5\' 5"  (1.651 m), weight 177 lb (80.3 kg), SpO2 100 %. Body mass index is 29.45 kg/m.  General: Cooperative, alert, well developed, in no acute distress. HEENT: Conjunctivae and lids unremarkable. Cardiovascular: Regular rhythm.  Lungs: Normal work of breathing. Neurologic: No focal deficits.   Lab Results  Component Value Date   CREATININE 1.19 (H) 05/24/2019   BUN 19 05/24/2019   NA 138 05/24/2019   K 3.9 05/24/2019   CL 104 05/24/2019   CO2 28 05/24/2019   Lab Results  Component Value Date   ALT 12 05/24/2019   AST 17 05/24/2019   ALKPHOS 72 05/24/2019   BILITOT 0.6 05/24/2019   Lab Results  Component Value Date   HGBA1C 5.7 (H) 10/20/2018   HGBA1C 5.6 05/31/2018  HGBA1C 6.1 (H) 12/22/2017   Lab Results  Component Value Date   INSULIN 4.8 05/31/2018   INSULIN 13.7 12/22/2017   Lab Results  Component Value Date   TSH 3.644 03/10/2018   Lab Results  Component Value Date   CHOL 176 12/22/2017   HDL 61 12/22/2017   LDLCALC 93 12/22/2017   TRIG 111 12/22/2017   Lab Results    Component Value Date   WBC 3.9 (L) 05/24/2019   HGB 11.8 (L) 05/24/2019   HCT 37.4 05/24/2019   MCV 96.1 05/24/2019   PLT 193 05/24/2019   No results found for: IRON, TIBC, FERRITIN  Attestation Statements:   Reviewed by clinician on day of visit: allergies, medications, problem list, medical history, surgical history, family history, social history, and previous encounter notes.  Time spent on visit including pre-visit chart review and post-visit charting and care was 20 minutes.   Migdalia Dk, am acting as Location manager for [employer], DO   I have reviewed the above documentation for accuracy and completeness, and I agree with the above. Jearld Lesch, DO

## 2019-06-27 ENCOUNTER — Ambulatory Visit: Payer: Managed Care, Other (non HMO) | Attending: Internal Medicine

## 2019-06-27 DIAGNOSIS — Z23 Encounter for immunization: Secondary | ICD-10-CM

## 2019-06-27 NOTE — Progress Notes (Signed)
   Covid-19 Vaccination Clinic  Name:  NADRA ONI    MRN: [id number] DOB: 10-24-1955  06/27/2019  Ms. Wegener was observed post Covid-19 immunization for 15 minutes without incident. She was provided with Vaccine Information Sheet and instruction to access the V-Safe system.   Ms. Schuring was instructed to call 911 with any severe reactions post vaccine: Marland Kitchen Difficulty breathing  . Swelling of face and throat  . A fast heartbeat  . A bad rash all over body  . Dizziness and weakness   Immunizations Administered    Name Date Dose VIS Date Route   Pfizer COVID-19 Vaccine 06/27/2019  8:39 AM 0.3 mL 03/04/2019 Intramuscular   Manufacturer: [place]   Lot: [id number]   [place]: [id number]

## 2019-06-30 ENCOUNTER — Encounter (INDEPENDENT_AMBULATORY_CARE_PROVIDER_SITE_OTHER): Payer: Self-pay

## 2019-07-05 ENCOUNTER — Encounter: Payer: Self-pay | Admitting: Hematology and Oncology

## 2019-07-05 ENCOUNTER — Ambulatory Visit (INDEPENDENT_AMBULATORY_CARE_PROVIDER_SITE_OTHER): Payer: Managed Care, Other (non HMO) | Admitting: Bariatrics

## 2019-07-06 ENCOUNTER — Other Ambulatory Visit: Payer: Self-pay | Admitting: *Deleted

## 2019-07-06 ENCOUNTER — Ambulatory Visit (INDEPENDENT_AMBULATORY_CARE_PROVIDER_SITE_OTHER): Payer: Managed Care, Other (non HMO) | Admitting: Orthopedic Surgery

## 2019-07-06 ENCOUNTER — Other Ambulatory Visit: Payer: Self-pay

## 2019-07-06 DIAGNOSIS — M7502 Adhesive capsulitis of left shoulder: Secondary | ICD-10-CM

## 2019-07-06 DIAGNOSIS — M75112 Incomplete rotator cuff tear or rupture of left shoulder, not specified as traumatic: Secondary | ICD-10-CM

## 2019-07-06 MED ORDER — ESTRADIOL 0.025 MG/24HR TD PTTW: 1.0000 | MEDICATED_PATCH | TRANSDERMAL | 0 refills | Status: DC

## 2019-07-08 ENCOUNTER — Encounter: Payer: Self-pay | Admitting: Orthopedic Surgery

## 2019-07-08 NOTE — Progress Notes (Signed)
Office Visit Note   Patient: Christy Serrano           Date of Birth: [birth year]           MRN: [id number] Visit Date: 07/06/2019 Requested by: Velna Hatchet, MD [address] [place],   [address] PCP: Velna Hatchet, MD  Subjective: Chief Complaint  Patient presents with  . Left Shoulder - Follow-up    HPI: Christy Serrano is a 64 y.o. female who presents to the office complaining of L shoulder stiffness.  She returns for recheck on adhesive capsulitis of the left shoulder.  She has transitioned to a HEP from PT and is doing well.  She denies any weakness and notes an improved range of motion but she states that she has not back to 100%.  She is sleeping well without pain.  She denies any pain in her day-to-day life.  She occasionally takes a muscle relaxer for symptoms.  She uses a shoulder pulley at home.  She admits to only doing the exercises every few days.              ROS:  All systems reviewed are negative as they relate to the chief complaint within the history of present illness.  Patient denies fevers or chills.  Assessment & Plan: Visit Diagnoses:  1. Adhesive capsulitis of left shoulder   2. Nontraumatic incomplete tear of left rotator cuff     Plan: Patient is a 64 year old female who presents for reevaluation of adhesive capsulitis of the left shoulder.  She has significantly improved range of motion and pain of the left shoulder but she still lacks some range of motion in forward flexion, abduction, external rotation, internal rotation compared with the contralateral arm.  However, despite this lack of motion she is much more functional with no pain and no significant deficiencies in her daily life.  She is able to sleep well.  She does have some crepitus with passive range of motion of that shoulder that is not present on the right side.  An ultrasound revealed what looks to be a partial thickness tear of the supraspinatus but she has excellent strength on  exam.  This does not seem to be symptomatic for her, at least not yet.  We will reevaluate her range of motion and her strength regarding the supraspinatus tear in 6 months.  Patient agrees with this plan.  The tear is small enough at this time that it could conceivably still be repaired in 6 months.  Follow-Up Instructions: No follow-ups on file.   Orders:  No orders of the defined types were placed in this encounter.  No orders of the defined types were placed in this encounter.     Procedures: No procedures performed   Clinical Data: No additional findings.  Objective: Vital Signs: There were no vitals taken for this visit.  Physical Exam:  Constitutional: Patient appears well-developed HEENT:  Head: Normocephalic Eyes:EOM are normal Neck: Normal range of motion Cardiovascular: Normal rate Pulmonary/chest: Effort normal Neurologic: Patient is alert Skin: Skin is warm Psychiatric: Patient has normal mood and affect  Ortho Exam:  Left shoulder Exam Slightly decreased forward flexion, abduction, external rotation, internal rotation compared with the contralateral arm Small amount of crepitus felt with passive range of motion of the left shoulder with the arm at 90 degrees while the shoulder is externally rotated/internally rotated No TTP over the [facility] joint or bicipital groove Good subscapularis, supraspinatus, and infraspinatus strength Negative Hawkins impingement  5/5 grip strength, forearm pronation/supination, and bicep strength  Specialty Comments:  No specialty comments available.  Imaging: No results found.   PMFS History: Patient Active Problem List   Diagnosis Date Noted  . Carcinoma of upper-outer quadrant of left breast in female, estrogen receptor negative ([place]) 07/12/2018  . Seizure-like activity ([place])   . Hyponatremia   . Recurrent syncope 03/08/2018  . LFT elevation   . Seizure ([place])   . Thrombocytopenia ([place])   . Port-A-Cath in place 03/04/2018   . Genetic testing 02/23/2018  . Family history of breast cancer   . Family history of prostate cancer   . Family history of ovarian cancer   . Malignant neoplasm of left female breast ([place]) 02/03/2018  . Prediabetes 01/06/2018  . Reactive airway disease without complication [id number]  . Class 1 obesity with serious comorbidity and body mass index (BMI) of 32.0 to 32.9 in adult 12/22/2017   Past Medical History:  Diagnosis Date  . Breast cancer ([place]) 2020   Left Breast Cancer  . Cancer ([place])    left breast  . Chronic bronchitis ([place])   . Family history of breast cancer   . Family history of ovarian cancer   . Family history of prostate cancer   . History of radiation therapy 09/02/18- 10/14/18   Left Breast 25 fx for a total of 50 Gy, Left SCV, PA 25 fractions for a total of 50 Gy, Left Breast boost 5 fractions, for a total of 10 Gy.   . Lactose intolerance   . Personal history of chemotherapy 2020   Left Breast Cancer  . Personal history of radiation therapy 2020   Left Breast Cancer  . PONV (postoperative nausea and vomiting)     Family History  Problem Relation Age of Onset  . High blood pressure Mother   . Heart attack Mother        CHF  . High blood pressure Father   . Prostate cancer Father        dx mid 61s  . Breast cancer Sister 53       negative [employer] 46 gene panel  . Breast cancer Paternal Aunt        dx mid 52s  . Cancer Paternal Uncle        jaw cancer d. 6s  . Ovarian cancer Maternal Grandmother        d. 35s  . Cancer Paternal Grandmother        either stomach or ovarian cancer  . Breast cancer Sister 13       "negative 21 gene panel"    Past Surgical History:  Procedure Laterality Date  . BREAST LUMPECTOMY Left 07/12/2018  . BREAST LUMPECTOMY WITH RADIOACTIVE SEED AND AXILLARY LYMPH NODE DISSECTION Left 07/12/2018   Procedure: LEFT BREAST RADIOACTIVE SEED X 3 LUMPECTOMY X 3  AND AXILLARY LYMPH NODE DISSECTION;  Surgeon: Jovita Kussmaul, MD;   Location: [facility];  Service: General;  Laterality: Left;  . PORT-A-CATH REMOVAL Right 04/01/2019   Procedure: REMOVAL PORT-A-CATH;  Surgeon: Jovita Kussmaul, MD;  Location: [facility];  Service: General;  Laterality: Right;  . PORTACATH PLACEMENT Right 03/03/2018   Procedure: INSERTION PORT-A-CATH;  Surgeon: Jovita Kussmaul, MD;  Location: [facility];  Service: General;  Laterality: Right;  . ROTATOR CUFF REPAIR Right 2006  . TOTAL ABDOMINAL HYSTERECTOMY  2010   Social History   Occupational History  . Occupation: Quarry manager  Comment: [employer]  Tobacco Use  . Smoking status: Never Smoker  . Smokeless tobacco: Never Used  Substance and Sexual Activity  . Alcohol use: No    Comment: quit long ago  . Drug use: No  . Sexual activity: Not on file

## 2019-07-10 ENCOUNTER — Encounter: Payer: Self-pay | Admitting: Orthopedic Surgery

## 2019-07-18 MED FILL — PARoxetine HCL 10 MG TABS: 10 | 30 days supply | Qty: 30 | Fill #0

## 2019-08-02 ENCOUNTER — Other Ambulatory Visit: Payer: Self-pay

## 2019-08-02 ENCOUNTER — Ambulatory Visit (INDEPENDENT_AMBULATORY_CARE_PROVIDER_SITE_OTHER): Payer: Managed Care, Other (non HMO) | Admitting: Bariatrics

## 2019-08-02 ENCOUNTER — Encounter (INDEPENDENT_AMBULATORY_CARE_PROVIDER_SITE_OTHER): Payer: Self-pay | Admitting: Bariatrics

## 2019-08-02 VITALS — BP 136/66 | HR 59 | Temp 97.8°F | Ht 65.0 in | Wt 180.0 lb

## 2019-08-02 DIAGNOSIS — Z6830 Body mass index (BMI) 30.0-30.9, adult: Secondary | ICD-10-CM | POA: Diagnosis not present

## 2019-08-02 DIAGNOSIS — M25559 Pain in unspecified hip: Secondary | ICD-10-CM | POA: Diagnosis not present

## 2019-08-02 DIAGNOSIS — E559 Vitamin D deficiency, unspecified: Secondary | ICD-10-CM

## 2019-08-02 DIAGNOSIS — E669 Obesity, unspecified: Secondary | ICD-10-CM

## 2019-08-02 NOTE — Progress Notes (Signed)
Chief Complaint:   OBESITY Christy Serrano is here to discuss her progress with her obesity treatment plan along with follow-up of her obesity related diagnoses. Christy Serrano is on the Category 2 Plan and states she is following her eating plan approximately 50% of the time. Christy Serrano states she is walking 60 minutes 1 time per week.  Today's visit was #: 21 Starting weight: 195 lbs Starting date: 12/22/2017 Today's weight: 180 lbs Today's date: 08/02/2019 Total lbs lost to date: 15 Total lbs lost since last in-office visit: 0  Interim History: Christy Serrano is up 3 lbs, but has done well overall. She is having more hip pain and is not walking as much; she is standing a lot at work. She is up about 1 lb of water on today's visit.  Subjective:   Vitamin D deficiency. Christy Serrano is taking Vitamin D OTC. Last Vitamin D 46.6 on 10/20/2018.  Hip pain. Christy Serrano reports hip pain has decreased her ability to walk.  Assessment/Plan:   Vitamin D deficiency. Low Vitamin D level contributes to fatigue and are associated with obesity, breast, and colon cancer. She agrees to continue to take Vitamin D and will follow-up for routine testing of Vitamin D, at least 2-3 times per year to avoid over-replacement.  Hip pain. Christy Serrano will follow-up with her orthopedist as scheduled.  Class 1 obesity with serious comorbidity and body mass index (BMI) of 30.0 to 30.9 in adult, unspecified obesity type.  Christy Serrano is currently in the action stage of change. As such, her goal is to continue with weight loss efforts. She has agreed to the Category 2 Plan.   She will work on meal planning and intentional eating.  Exercise goals: Christy Serrano will exercise as tolerated.  Behavioral modification strategies: increasing lean protein intake, decreasing simple carbohydrates, increasing vegetables, increasing water intake, decreasing eating out, no skipping meals, meal planning and cooking strategies, keeping healthy foods  in the home and planning for success.  Christy Serrano has agreed to follow-up with our clinic in 8 weeks. She was informed of the importance of frequent follow-up visits to maximize her success with intensive lifestyle modifications for her multiple health conditions.   Objective:   Blood pressure 136/66, pulse (!) 59, temperature 97.8 F (36.6 C), height 5\' 5"  (1.651 m), weight 180 lb (81.6 kg), SpO2 (!) 10 %. Body mass index is 29.95 kg/m.  General: Cooperative, alert, well developed, in no acute distress. HEENT: Conjunctivae and lids unremarkable. Cardiovascular: Regular rhythm.  Lungs: Normal work of breathing. Neurologic: No focal deficits.   Lab Results  Component Value Date   CREATININE 1.19 (H) 05/24/2019   BUN 19 05/24/2019   NA 138 05/24/2019   K 3.9 05/24/2019   CL 104 05/24/2019   CO2 28 05/24/2019   Lab Results  Component Value Date   ALT 12 05/24/2019   AST 17 05/24/2019   ALKPHOS 72 05/24/2019   BILITOT 0.6 05/24/2019   Lab Results  Component Value Date   HGBA1C 5.7 (H) 10/20/2018   HGBA1C 5.6 05/31/2018   HGBA1C 6.1 (H) 12/22/2017   Lab Results  Component Value Date   INSULIN 4.8 05/31/2018   INSULIN 13.7 12/22/2017   Lab Results  Component Value Date   TSH 3.644 03/10/2018   Lab Results  Component Value Date   CHOL 176 12/22/2017   HDL 61 12/22/2017   LDLCALC 93 12/22/2017   TRIG 111 12/22/2017   Lab Results  Component Value Date   WBC 3.9 (L)  05/24/2019   HGB 11.8 (L) 05/24/2019   HCT 37.4 05/24/2019   MCV 96.1 05/24/2019   PLT 193 05/24/2019   No results found for: IRON, TIBC, FERRITIN  Attestation Statements:   Reviewed by clinician on day of visit: allergies, medications, problem list, medical history, surgical history, family history, social history, and previous encounter notes.  Time spent on visit including pre-visit chart review and post-visit charting and care was 20 minutes.   Migdalia Dk, am acting as Location manager  for [employer], DO   I have reviewed the above documentation for accuracy and completeness, and I agree with the above. Jearld Lesch, DO

## 2019-08-15 MED FILL — MELOXICAM 15 MG TABLET: 15 | 30 days supply | Qty: 30 | Fill #0

## 2019-09-14 MED FILL — MELOXICAM 15 MG TABLET: 15 | 30 days supply | Qty: 30 | Fill #1

## 2019-09-29 ENCOUNTER — Ambulatory Visit (INDEPENDENT_AMBULATORY_CARE_PROVIDER_SITE_OTHER): Payer: Managed Care, Other (non HMO) | Admitting: Bariatrics

## 2019-09-29 ENCOUNTER — Other Ambulatory Visit: Payer: Self-pay

## 2019-09-29 ENCOUNTER — Encounter (INDEPENDENT_AMBULATORY_CARE_PROVIDER_SITE_OTHER): Payer: Self-pay | Admitting: Bariatrics

## 2019-09-29 VITALS — BP 134/72 | HR 64 | Temp 98.5°F | Ht 65.0 in | Wt 176.0 lb

## 2019-09-29 DIAGNOSIS — E559 Vitamin D deficiency, unspecified: Secondary | ICD-10-CM | POA: Diagnosis not present

## 2019-09-29 DIAGNOSIS — E669 Obesity, unspecified: Secondary | ICD-10-CM

## 2019-09-29 DIAGNOSIS — R7303 Prediabetes: Secondary | ICD-10-CM

## 2019-09-29 DIAGNOSIS — Z6830 Body mass index (BMI) 30.0-30.9, adult: Secondary | ICD-10-CM

## 2019-09-29 DIAGNOSIS — R7989 Other specified abnormal findings of blood chemistry: Secondary | ICD-10-CM

## 2019-09-29 DIAGNOSIS — Z9189 Other specified personal risk factors, not elsewhere classified: Secondary | ICD-10-CM | POA: Diagnosis not present

## 2019-09-29 NOTE — Progress Notes (Signed)
Chief Complaint:   OBESITY Christy Serrano is here to discuss her progress with her obesity treatment plan along with follow-up of her obesity related diagnoses. Christy Serrano is on the Category 2 Plan and states she is following her eating plan approximately 0% of the time. Christy Serrano states she is walking 20-30 minutes 1 time per week.  Today's visit was #: 22 Starting weight: 195 lbs Starting date: 12/22/2017 Today's weight: 176 lbs Today's date: 09/29/2019 Total lbs lost to date: 19 Total lbs lost since last in-office visit: 4  Interim History: Christy Serrano is down 4 lbs. She reports getting adequate protein intake.  Subjective:   Prediabetes. Christy Serrano has a diagnosis of prediabetes based on her elevated HgA1c and was informed this puts her at greater risk of developing diabetes. She continues to work on diet and exercise to decrease her risk of diabetes. She denies nausea or hypoglycemia. Christy Serrano is on no medication.  Lab Results  Component Value Date   HGBA1C 5.7 (H) 10/20/2018   Lab Results  Component Value Date   INSULIN 4.8 05/31/2018   INSULIN 13.7 12/22/2017   Vitamin D deficiency. Christy Serrano is taking Vitamin D OTC.   Ref. Range 10/20/2018 15:50  Vitamin D, 25-Hydroxy Latest Ref Range: 30.0 - 100.0 ng/mL 46.6   Elevated LFTs. Christy Serrano has a new dx of elevated ALT. Her BMI is over 40. She denies abdominal pain or jaundice and has never been told of any liver problems in the past. She denies excessive alcohol intake. Christy Serrano takes Tylenol, but not on a regular basis. She is taking Meloxicam.  Lab Results  Component Value Date   ALT 12 05/24/2019   AST 17 05/24/2019   ALKPHOS 72 05/24/2019   BILITOT 0.6 05/24/2019   At risk for impaired function of liver. Christy Serrano is at risk for impaired function of liver due to history of elevated liver function tests.  Assessment/Plan:   Prediabetes. Christy Serrano will continue to work on weight loss, exercise, increasing healthy fats  and protein, and decreasing simple carbohydrates to help decrease the risk of diabetes. Hemoglobin A1c, Insulin, random labs ordered today.  Vitamin D deficiency. Low Vitamin D level contributes to fatigue and are associated with obesity, breast, and colon cancer. She agrees to continue to take OTC Vitamin D as directed and VITAMIN D 25 Hydroxy (Vit-D Deficiency, Fractures) level was ordered today.  Elevated LFTs. We discussed the likely diagnosis of non-alcoholic fatty liver disease today and how this condition is obesity related. Christy Serrano was educated the importance of weight loss. Christy Serrano agreed to continue with her weight loss efforts with healthier diet and exercise as an essential part of her treatment plan. Christy Serrano will minimize her use of Tylenol. Comprehensive metabolic panel, Lipid Panel With LDL/HDL Ratio labs ordered today.  At risk for impaired function of liver. Christy Serrano was given approximately 15 minutes of counseling today regarding prevention of impaired liver function. Christy Serrano was educated about her risk of developing NASH or even liver failure and advised that the only proven treatment for NAFLD was weight loss of at least 5-10% of body weight.   Class 1 obesity with serious comorbidity and body mass index (BMI) of 30.0 to 30.9 in adult, unspecified obesity type - BMI greater than 30 at start.  Christy Serrano is currently in the action stage of change. As such, her goal is to continue with weight loss efforts. She has agreed to the Category 2 Plan.   She will work on meal planning and intentional  eating.   Handout was provided on Protein Shakes.  Exercise goals: All adults should avoid inactivity. Some physical activity is better than none, and adults who participate in any amount of physical activity gain some health benefits.  Behavioral modification strategies: increasing lean protein intake, decreasing simple carbohydrates, increasing vegetables, increasing water intake,  decreasing eating out, no skipping meals, meal planning and cooking strategies, keeping healthy foods in the home and planning for success.  Christy Serrano has agreed to follow-up with our clinic in 2 weeks. She was informed of the importance of frequent follow-up visits to maximize her success with intensive lifestyle modifications for her multiple health conditions.   Christy Serrano was informed we would discuss her lab results at her next visit unless there is a critical issue that needs to be addressed sooner. Christy Serrano agreed to keep her next visit at the agreed upon time to discuss these results.  Objective:   Blood pressure 134/72, pulse 64, temperature 98.5 F (36.9 C), height 5\' 5"  (1.651 m), weight 176 lb (79.8 kg), SpO2 98 %. Body mass index is 29.29 kg/m.  General: Cooperative, alert, well developed, in no acute distress. HEENT: Conjunctivae and lids unremarkable. Cardiovascular: Regular rhythm.  Lungs: Normal work of breathing. Neurologic: No focal deficits.   Lab Results  Component Value Date   CREATININE 1.19 (H) 05/24/2019   BUN 19 05/24/2019   NA 138 05/24/2019   K 3.9 05/24/2019   CL 104 05/24/2019   CO2 28 05/24/2019   Lab Results  Component Value Date   ALT 12 05/24/2019   AST 17 05/24/2019   ALKPHOS 72 05/24/2019   BILITOT 0.6 05/24/2019   Lab Results  Component Value Date   HGBA1C 5.7 (H) 10/20/2018   HGBA1C 5.6 05/31/2018   HGBA1C 6.1 (H) 12/22/2017   Lab Results  Component Value Date   INSULIN 4.8 05/31/2018   INSULIN 13.7 12/22/2017   Lab Results  Component Value Date   TSH 3.644 03/10/2018   Lab Results  Component Value Date   CHOL 176 12/22/2017   HDL 61 12/22/2017   LDLCALC 93 12/22/2017   TRIG 111 12/22/2017   Lab Results  Component Value Date   WBC 3.9 (L) 05/24/2019   HGB 11.8 (L) 05/24/2019   HCT 37.4 05/24/2019   MCV 96.1 05/24/2019   PLT 193 05/24/2019   No results found for: IRON, TIBC, FERRITIN  Attestation Statements:    Reviewed by clinician on day of visit: allergies, medications, problem list, medical history, surgical history, family history, social history, and previous encounter notes.  Migdalia Dk, am acting as Location manager for [employer], DO   I have reviewed the above documentation for accuracy and completeness, and I agree with the above. Jearld Lesch, DO

## 2019-09-30 LAB — COMPREHENSIVE METABOLIC PANEL
ALT: 13 IU/L (ref 0–32)
AST: 19 IU/L (ref 0–40)
Albumin/Globulin Ratio: 1.6 (ref 1.2–2.2)
Albumin: 4.5 g/dL (ref 3.8–4.8)
Alkaline Phosphatase: 72 IU/L (ref 48–121)
BUN/Creatinine Ratio: 14 (ref 12–28)
BUN: 14 mg/dL (ref 8–27)
Bilirubin Total: 0.6 mg/dL (ref 0.0–1.2)
CO2: 25 mmol/L (ref 20–29)
Calcium: 9.3 mg/dL (ref 8.7–10.3)
Chloride: 103 mmol/L (ref 96–106)
Creatinine, Ser: 0.98 mg/dL (ref 0.57–1.00)
GFR calc Af Amer: 71 mL/min/{1.73_m2} (ref >59)
GFR calc non Af Amer: 62 mL/min/{1.73_m2} (ref >59)
Globulin, Total: 2.8 g/dL (ref 1.5–4.5)
Glucose: 88 mg/dL (ref 65–99)
Potassium: 4.1 mmol/L (ref 3.5–5.2)
Sodium: 143 mmol/L (ref 134–144)
Total Protein: 7.3 g/dL (ref 6.0–8.5)

## 2019-09-30 LAB — LIPID PANEL WITH LDL/HDL RATIO
Cholesterol, Total: 197 mg/dL (ref 100–199)
HDL: 81 mg/dL (ref >39)
LDL Chol Calc (NIH): 105 mg/dL — ABNORMAL HIGH (ref 0–99)
LDL/HDL Ratio: 1.3 ratio (ref 0.0–3.2)
Triglycerides: 60 mg/dL (ref 0–149)
VLDL Cholesterol Cal: 11 mg/dL (ref 5–40)

## 2019-09-30 LAB — VITAMIN D 25 HYDROXY (VIT D DEFICIENCY, FRACTURES): Vit D, 25-Hydroxy: 48.7 ng/mL (ref 30.0–100.0)

## 2019-09-30 LAB — HEMOGLOBIN A1C
Est. average glucose Bld gHb Est-mCnc: 123 mg/dL
Hgb A1c MFr Bld: 5.9 % — ABNORMAL HIGH (ref 4.8–5.6)

## 2019-09-30 LAB — INSULIN, RANDOM: INSULIN: 9.2 u[IU]/mL (ref 2.6–24.9)

## 2019-10-18 ENCOUNTER — Encounter: Payer: Self-pay | Admitting: Hematology and Oncology

## 2019-10-18 ENCOUNTER — Other Ambulatory Visit: Payer: Self-pay | Admitting: *Deleted

## 2019-10-18 MED ORDER — ESTRADIOL 0.025 MG/24HR TD PTTW: 1.0000 | MEDICATED_PATCH | TRANSDERMAL | 1 refills | Status: DC

## 2019-11-28 NOTE — Progress Notes (Signed)
 Patient Care Team: Holwerda, Scott, MD as PCP - General (Internal Medicine) Toth, Paul III, MD as Consulting Physician (General Surgery) Gudena, Vinay, MD as Consulting Physician (Hematology and Oncology) Squire, Sarah, MD as Attending Physician (Radiation Oncology) Bensimhon, Daniel R, MD as Consulting Physician (Cardiology)  DIAGNOSIS:    ICD-10-CM   1. Malignant neoplasm of upper-outer quadrant of left breast in female, estrogen receptor negative (HCC)  C50.412    Z17.1     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 breast (HCC)  01/18/2018 Initial Diagnosis   Screening mammogram detected microcalcifications and asymmetry, left breast segmental microcalcifications UOQ 1.5 x 4.3 x 5.4 cm biopsy-proven IDC with DCIS with LV I, grade 3, ER 0%, PR 0%, Ki-67 30%, HER-2 +3+ by IHC; hypoechoic mass 2 o'clock position 1.2 x 1.3 x 1.7 cm biopsy IDC, LV I present, grade 2-3, ER 0%, PR 0%, Ki-67 40%, HER-2 +3+, left axillary lymph node positive (3 nodes noted by US) T3 N1 stage IIIA   02/03/2018 Cancer Staging   Staging form: Breast, AJCC 8th Edition - Clinical: Stage IIIA (cT3, cN1, cM0, G3, ER-, PR-, HER2+) - Signed by Gudena, Vinay, MD on 02/03/2018   02/20/2018 Genetic Testing   No pathogenic variants identified on the [facility] [employer] Expanded + RNA insight panel. Genes Analyzed (67 total): AIP, ALK, APC*, ATM*, BAP1, BARD1, BLM, BMPR1A, BRCA1*, BRCA2*, BRIP1*, CDH1*, CDK4, CDKN1B, CDKN2A, CHEK2*, DICER1, FANCC, FH, FLCN, GALNT12, HOXB13, MAX, MEN1, MET, MLH1*, MRE11A, MSH2*, MSH6*, MUTYH*, NBN, NF1*, NF2, PALB2*, PHOX2B, PMS2*, POLD1, POLE, POT1, PRKAR1A, PTCH1, PTEN*, RAD50, RAD51C*, RAD51D*, RB1, RET, SDHA, SDHAF2, SDHB, SDHC, SDHD, SMAD4, SMARCA4, SMARCB1, SMARCE1, STK11, SUFU, TMEM127, TP53*, TSC1, TSC2, VHL and XRCC2 (sequencing and deletion/duplication); MITF (sequencing only); EPCAM and GREM1 (deletion/duplication only). DNA and RNA analyses  performed for * genes. The report date is 02/20/2018.   03/04/2018 -  Neo-Adjuvant Chemotherapy   Neoadjuvant chemotherapy with TCH Perjeta   03/08/2018 - 03/10/2018 Hospital Admission   Admitted for recurrent syncope with loss of consciousness for up to 5 minutes with loss of bladder, no abnormalities identified.  MRI brain, carotid ultrasound, EEG studies were negative.   06/17/2018 Breast MRI   No suspicious residual enhancement identified in the left breast, compatible with treatment response, interval decrease in size of left axillary lymph nodes   07/12/2018 Surgery   Lumpectomy (Dr. Toth): complete treatment response, no residual carcinoma present in breast or 11/11 lymph nodes.    09/02/2018 - 10/14/2018 Radiation Therapy   Adjuvant XRT   Carcinoma of upper-outer quadrant of left breast in female, estrogen receptor negative (HCC)  07/12/2018 Initial Diagnosis   Carcinoma of upper-outer quadrant of left breast in female, estrogen receptor negative (HCC)   08/11/2018 Cancer Staging   Staging form: Breast, AJCC 8th Edition - Pathologic: No Stage Recommended (ypT0, pN0) - Signed by Squire, Sarah, MD on 08/11/2018   08/11/2018 Cancer Staging   Staging form: Breast, AJCC 8th Edition - Clinical: Stage IIIA (cT3, cN1, cM0, G3, ER-, PR-, HER2+) - Signed by Squire, Sarah, MD on 08/11/2018   08/20/2018 -  Chemotherapy   The patient had trastuzumab (HERCEPTIN) 450 mg in sodium chloride 0.9 % 250 mL chemo infusion, 462 mg (100 % of original dose 6 mg/kg), Intravenous,  Once, 9 of 9 cycles Dose modification: 6 mg/kg (original dose 6 mg/kg, Cycle 1, Reason: Provider Judgment), 450 mg (original dose 450 mg, Cycle 6, Reason: Other (see comments), Comment: pt assist for   Herceptin) Administration: 450 mg (08/20/2018), 450 mg (09/10/2018), 450 mg (10/01/2018), 450 mg (10/22/2018), 450 mg (12/03/2018), 450 mg (12/24/2018), 450 mg (01/14/2019), 450 mg (02/04/2019), 450 mg (02/25/2019) pertuzumab (PERJETA) 420 mg  in sodium chloride 0.9 % 250 mL chemo infusion, 420 mg (100 % of original dose 420 mg), Intravenous, Once, 10 of 10 cycles Dose modification: 420 mg (original dose 420 mg, Cycle 1, Reason: Provider Judgment) Administration: 420 mg (08/20/2018), 420 mg (09/10/2018), 420 mg (11/12/2018), 420 mg (10/01/2018), 420 mg (10/22/2018), 420 mg (12/03/2018), 420 mg (12/24/2018), 420 mg (01/14/2019), 420 mg (02/04/2019), 420 mg (02/25/2019) trastuzumab-dkst (OGIVRI) 450 mg in sodium chloride 0.9 % 250 mL chemo infusion, 450 mg (100 % of original dose 450 mg), Intravenous,  Once, 2 of 2 cycles Dose modification: 450 mg (original dose 450 mg, Cycle 5, Reason: Other (see comments), Comment: Biosimilar Conversion), 6 mg/kg (original dose 6 mg/kg, Cycle 7, Reason: Other (see comments), Comment: to biosimalar) Administration: 450 mg (11/12/2018)  for chemotherapy treatment.      CHIEF COMPLIANT: Follow-up of left breast cancer on surveillance  INTERVAL HISTORY: Christy Serrano is a 64 y.o. with above-mentioned history of left breast cancer treated with neoadjuvant chemotherapy,lumpectomy, radiation, Herceptin Perjeta maintenance, and is currently on surveillance.She presents to the clinic todayfor follow-up.  Her major issue is a stiffness in the left shoulder.  Tenderness in the breast from prior surgery.  Neuropathy has mostly gone away.  ALLERGIES:  is allergic to gabapentin, amoxicillin, and oxycodone.  MEDICATIONS:  Current Outpatient Medications  Medication Sig Dispense Refill  . acetaminophen (TYLENOL) 500 MG tablet Take 1,000 mg by mouth every 6 (six) hours as needed for mild pain.    Marland Kitchen albuterol (PROVENTIL HFA;VENTOLIN HFA) 108 (90 Base) MCG/ACT inhaler Inhale into the lungs every 6 (six) hours as needed for wheezing or shortness of breath.    . budesonide-formoterol (SYMBICORT) 160-4.5 MCG/ACT inhaler Inhale 2 puffs into the lungs 2 (two) times daily.    . Calcium-Phosphorus-Vitamin D (CITRACAL +D3 PO) Take  1,200 mcg by mouth.    . cholecalciferol (VITAMIN D) 1000 units tablet Take 1,000 Units by mouth daily.    Marland Kitchen estradiol (VIVELLE-DOT) 0.025 MG/24HR Place 1 patch onto the skin 2 (two) times a week. 24 patch 1  . HYDROcodone-acetaminophen (NORCO/VICODIN) 5-325 MG tablet Take 1 tablet by mouth every 6 (six) hours as needed for moderate pain or severe pain. 10 tablet 0  . methocarbamol (ROBAXIN) 500 MG tablet 1 po q 8 hrs prn 30 tablet 0  . Multiple Vitamin (MULTIVITAMIN) capsule Take 1 capsule by mouth daily.     No current facility-administered medications for this visit.    PHYSICAL EXAMINATION: ECOG PERFORMANCE STATUS: 1 - Symptomatic but completely ambulatory  Vitals:   11/29/19 1457  BP: 136/73  Pulse: 76  Resp: 18  Temp: 97.6 F (36.4 C)  SpO2: 100%   Filed Weights   11/29/19 1457  Weight: 185 lb 11.2 oz (84.2 kg)    BREAST: No palpable masses or nodules in either right or left breasts. No palpable axillary supraclavicular or infraclavicular adenopathy no breast tenderness or nipple discharge. (exam performed in the presence of a chaperone)  LABORATORY DATA:  I have reviewed the data as listed CMP Latest Ref Rng & Units 09/29/2019 05/24/2019 03/29/2019  Glucose 65 - 99 mg/dL 88 127(H) 77  BUN 8 - 27 mg/dL _0 Creatinine 0.57 - 1.00 mg/dL 0.98 1.19(H) 1.18(H)  Sodium 134 - 144 mmol/L 143 138  138  Potassium 3.5 - 5.2 mmol/L 4.1 3.9 4.5  Chloride 96 - 106 mmol/L 103 104 104  CO2 20 - 29 mmol/L _0 Calcium 8.7 - 10.3 mg/dL 9.3 9.5 9.1  Total Protein 6.0 - 8.5 g/dL 7.3 7.5 -  Total Bilirubin 0.0 - 1.2 mg/dL 0.6 0.6 -  Alkaline Phos 48 - 121 IU/L 72 72 -  AST 0 - 40 IU/L 19 17 -  ALT 0 - 32 IU/L 13 12 -    Lab Results  Component Value Date   WBC 3.9 (L) 05/24/2019   HGB 11.8 (L) 05/24/2019   HCT 37.4 05/24/2019   MCV 96.1 05/24/2019   PLT 193 05/24/2019   NEUTROABS 2.2 05/24/2019    ASSESSMENT & PLAN:  Malignant neoplasm of left female breast  ([place]) 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, grade 3, ER 0%, PR 0%, Ki-67 30%, HER-2 +3+ by IHC; hypoechoic mass 2 o'clock position 1.2 x 1.3 x 1.7 cm biopsy IDC, LV I present, grade 2-3, ER 0%, PR 0%, Ki-67 40%, HER-2 +3+, left axillary lymph node positive (3 nodes noted by [place]) T3 N1 stage IIIA  Treatment plan 1.Neoadjuvant chemotherapy with [place] completed 3/26/2020followed by Herceptin Perjeta maintenance completed December 2020 2.lumpectomywith axillary lymphnode dissection: 07/12/2018 (Dr. Marlou Starks): complete treatment response, no residual carcinoma present in breast or 11/11 lymph nodes. 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 Discussed the pros and cons of neratinib and decided not to pursue it.  Breast cancer surveillance:  Mammogram 01/07/2019: Benign Breast exam 11/29/2019: Benign  Return to clinic in  1 year for follow-up.    No orders of the defined types were placed in this encounter.  The patient has a good understanding of the overall plan. she agrees with it. she will call with any problems that may develop before the next visit here.  Total time spent: 20 mins including face to face time and time spent for planning, charting and coordination of care  Nicholas Lose, MD 11/29/2019  I, Cloyde Reams Dorshimer, am acting as scribe for Dr. Nicholas Lose.  I have reviewed the above documentation for accuracy and completeness, and I agree with the above.

## 2019-11-29 ENCOUNTER — Other Ambulatory Visit: Payer: Self-pay

## 2019-11-29 ENCOUNTER — Inpatient Hospital Stay: Payer: Managed Care, Other (non HMO) | Attending: Hematology and Oncology | Admitting: Hematology and Oncology

## 2019-11-29 DIAGNOSIS — Z79899 Other long term (current) drug therapy: Secondary | ICD-10-CM | POA: Diagnosis not present

## 2019-11-29 DIAGNOSIS — Z171 Estrogen receptor negative status [ER-]: Secondary | ICD-10-CM | POA: Diagnosis not present

## 2019-11-29 DIAGNOSIS — Z923 Personal history of irradiation: Secondary | ICD-10-CM | POA: Diagnosis not present

## 2019-11-29 DIAGNOSIS — T451X5A Adverse effect of antineoplastic and immunosuppressive drugs, initial encounter: Secondary | ICD-10-CM | POA: Diagnosis not present

## 2019-11-29 DIAGNOSIS — M25612 Stiffness of left shoulder, not elsewhere classified: Secondary | ICD-10-CM | POA: Insufficient documentation

## 2019-11-29 DIAGNOSIS — C50412 Malignant neoplasm of upper-outer quadrant of left female breast: Secondary | ICD-10-CM | POA: Insufficient documentation

## 2019-11-29 DIAGNOSIS — Z170 Estrogen receptor positive status [ER+]: Secondary | ICD-10-CM | POA: Insufficient documentation

## 2019-11-29 DIAGNOSIS — R600 Localized edema: Secondary | ICD-10-CM | POA: Diagnosis not present

## 2019-11-29 DIAGNOSIS — G620 Drug-induced polyneuropathy: Secondary | ICD-10-CM | POA: Insufficient documentation

## 2019-11-29 MED ORDER — MELOXICAM 15 MG PO TBDP: 1.0000 | ORAL_TABLET | Freq: Two times a day (BID) | ORAL | Status: DC

## 2019-11-29 NOTE — Assessment & Plan Note (Signed)
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, grade 3, ER 0%, PR 0%, Ki-67 30%, HER-2 +3+ by IHC; hypoechoic mass 2 o'clock position 1.2 x 1.3 x 1.7 cm biopsy IDC, LV I present, grade 2-3, ER 0%, PR 0%, Ki-67 40%, HER-2 +3+, left axillary lymph node positive (3 nodes noted by [place]) T3 N1 stage IIIA  Treatment plan 1.Neoadjuvant chemotherapy with [place] completed 3/26/2020followed by Herceptin Perjeta maintenance completed December 2020 2.lumpectomywith axillary lymphnode dissection: 07/12/2018 (Dr. Marlou Starks): complete treatment response, no residual carcinoma present in breast or 11/11 lymph nodes. 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 Discussed the pros and cons of neratinib and decided not to pursue it.  Breast cancer surveillance:  Mammogram 01/07/2019: Benign Breast exam 11/29/2019: Benign  Return to clinic in  1 year for follow-up.

## 2020-01-03 ENCOUNTER — Ambulatory Visit (INDEPENDENT_AMBULATORY_CARE_PROVIDER_SITE_OTHER): Payer: Managed Care, Other (non HMO) | Admitting: Bariatrics

## 2020-01-04 ENCOUNTER — Ambulatory Visit: Payer: Managed Care, Other (non HMO) | Admitting: Orthopedic Surgery

## 2020-01-04 ENCOUNTER — Encounter: Payer: Self-pay | Admitting: Orthopedic Surgery

## 2020-01-04 DIAGNOSIS — M7502 Adhesive capsulitis of left shoulder: Secondary | ICD-10-CM

## 2020-01-04 NOTE — Progress Notes (Signed)
Office Visit Note   Patient: Christy Serrano           Date of Birth: 08-08-1955           MRN: [id number] Visit Date: 01/04/2020 Requested by: Velna Hatchet, MD [address] [place],  [place] [address] PCP: Velna Hatchet, MD  Subjective: Chief Complaint  Patient presents with  . Follow-up    HPI: Christy Serrano is a 64 year old patient here for follow-up left shoulder adhesive capsulitis.  She is doing well and has no pain.  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  Denies any weakness.  Range of motion has close to equalize with the right shoulder              ROS: All systems reviewed are negative as they relate to the chief complaint within the history of present illness.  Patient denies  fevers or chills.   Assessment & Plan: Visit Diagnoses:  1. Adhesive capsulitis of left shoulder     Plan: Impression is patient is doing very well following left shoulder adhesive capsulitis.  No further intervention indicated.  Come back if symptoms worsen.  Continue with anti-inflammatories as needed but not on a regular basis  Follow-Up Instructions: Return if symptoms worsen or fail to improve.   Orders:  No orders of the defined types were placed in this encounter.  No orders of the defined types were placed in this encounter.     Procedures: No procedures performed   Clinical Data: No additional findings.  Objective: Vital Signs: There were no vitals taken for this visit.  Physical Exam:   Constitutional: Patient appears well-developed HEENT:  Head: Normocephalic Eyes:EOM are normal Neck: Normal range of motion Cardiovascular: Normal rate Pulmonary/chest: Effort normal Neurologic: Patient is alert Skin: Skin is warm Psychiatric: Patient has normal mood and affect    Ortho Exam: Ortho exam demonstrates full active and passive range of motion left versus right shoulder in terms of forward flexion abduction and external rotation.  External rotation  is to about 50 degrees bilaterally.  Rotator cuff strength is excellent on the left and right-hand side infraspinatus supraspinatus and subscap muscle testing.  No masses lymphadenopathy or skin changes noted in that shoulder girdle region.  No AC joint tenderness.  Specialty Comments:  No specialty comments available.  Imaging: No results found.   PMFS History: Patient Active Problem List   Diagnosis Date Noted  . Carcinoma of upper-outer quadrant of left breast in female, estrogen receptor negative ([facility]) 07/12/2018  . Seizure-like activity ([facility])   . Hyponatremia   . Recurrent syncope 03/08/2018  . LFT elevation   . Seizure ([facility])   . Thrombocytopenia ([facility])   . Port-A-Cath in place 03/04/2018  . Genetic testing 02/23/2018  . Family history of breast cancer   . Family history of prostate cancer   . Family history of ovarian cancer   . Malignant neoplasm of left female breast ([facility]) 02/03/2018  . Prediabetes 01/06/2018  . Reactive airway disease without complication 62/95/2841  . Class 1 obesity with serious comorbidity and body mass index (BMI) of 32.0 to 32.9 in adult 12/22/2017   Past Medical History:  Diagnosis Date  . Breast cancer ([facility]) 2020   Left Breast Cancer  . Cancer ([facility])    left breast  . Chronic bronchitis ([facility])   . Family history of breast cancer   . Family history of ovarian cancer   . Family history of prostate cancer   .  History of radiation therapy 09/02/18- 10/14/18   Left Breast 25 fx for a total of 50 Gy, Left SCV, PA 25 fractions for a total of 50 Gy, Left Breast boost 5 fractions, for a total of 10 Gy.   . Lactose intolerance   . Personal history of chemotherapy 2020   Left Breast Cancer  . Personal history of radiation therapy 2020   Left Breast Cancer  . PONV (postoperative nausea and vomiting)     Family History  Problem Relation Age of Onset  . High blood pressure Mother   . Heart attack Mother        CHF  . High blood pressure Father   .  Prostate cancer Father        dx mid 52s  . Breast cancer Sister 38       negative [employer]itae 46 gene panel  . Breast cancer Paternal Aunt        dx mid 74s  . Cancer Paternal Uncle        jaw cancer d. 23s  . Ovarian cancer Maternal Grandmother        d. 31s  . Cancer Paternal Grandmother        either stomach or ovarian cancer  . Breast cancer Sister 1       "negative 21 gene panel"    Past Surgical History:  Procedure Laterality Date  . BREAST LUMPECTOMY Left 07/12/2018  . BREAST LUMPECTOMY WITH RADIOACTIVE SEED AND AXILLARY LYMPH NODE DISSECTION Left 07/12/2018   Procedure: LEFT BREAST RADIOACTIVE SEED X 3 LUMPECTOMY X 3  AND AXILLARY LYMPH NODE DISSECTION;  Surgeon: Jovita Kussmaul, MD;  Location: [facility];  Service: General;  Laterality: Left;  . PORT-A-CATH REMOVAL Right 04/01/2019   Procedure: REMOVAL PORT-A-CATH;  Surgeon: Jovita Kussmaul, MD;  Location: [facility];  Service: General;  Laterality: Right;  . PORTACATH PLACEMENT Right 03/03/2018   Procedure: INSERTION PORT-A-CATH;  Surgeon: Jovita Kussmaul, MD;  Location: [facility];  Service: General;  Laterality: Right;  . ROTATOR CUFF REPAIR Right 2006  . TOTAL ABDOMINAL HYSTERECTOMY  2010   Social History   Occupational History  . Occupation: Quarry manager    Comment: [employer]  Tobacco Use  . Smoking status: Never Smoker  . Smokeless tobacco: Never Used  Vaping Use  . Vaping Use: Never used  Substance and Sexual Activity  . Alcohol use: No    Comment: quit long ago  . Drug use: No  . Sexual activity: Not on file

## 2020-01-09 ENCOUNTER — Other Ambulatory Visit: Payer: Self-pay

## 2020-01-09 ENCOUNTER — Ambulatory Visit
Admission: RE | Admit: 2020-01-09 | Discharge: 2020-01-09 | Disposition: A | Discharge status: Home / Self Care | Payer: Managed Care, Other (non HMO) | Source: Ambulatory Visit | Attending: Hematology and Oncology | Admitting: Hematology and Oncology

## 2020-01-09 DIAGNOSIS — Z171 Estrogen receptor negative status [ER-]: Secondary | ICD-10-CM

## 2020-01-10 ENCOUNTER — Encounter (INDEPENDENT_AMBULATORY_CARE_PROVIDER_SITE_OTHER): Payer: Self-pay | Admitting: Bariatrics

## 2020-01-10 NOTE — Telephone Encounter (Signed)
Please review

## 2020-02-07 ENCOUNTER — Ambulatory Visit (INDEPENDENT_AMBULATORY_CARE_PROVIDER_SITE_OTHER): Payer: Managed Care, Other (non HMO) | Admitting: Bariatrics

## 2020-03-06 ENCOUNTER — Encounter (INDEPENDENT_AMBULATORY_CARE_PROVIDER_SITE_OTHER): Payer: Self-pay | Admitting: Bariatrics

## 2020-03-06 ENCOUNTER — Other Ambulatory Visit: Payer: Self-pay

## 2020-03-06 ENCOUNTER — Ambulatory Visit (INDEPENDENT_AMBULATORY_CARE_PROVIDER_SITE_OTHER): Payer: Managed Care, Other (non HMO) | Admitting: Bariatrics

## 2020-03-06 VITALS — BP 132/78 | HR 65 | Temp 98.3°F | Ht 65.0 in | Wt 188.0 lb

## 2020-03-06 DIAGNOSIS — Z6831 Body mass index (BMI) 31.0-31.9, adult: Secondary | ICD-10-CM

## 2020-03-06 DIAGNOSIS — R7303 Prediabetes: Secondary | ICD-10-CM

## 2020-03-06 DIAGNOSIS — E559 Vitamin D deficiency, unspecified: Secondary | ICD-10-CM

## 2020-03-06 DIAGNOSIS — E669 Obesity, unspecified: Secondary | ICD-10-CM

## 2020-03-06 NOTE — Progress Notes (Signed)
Chief Complaint:   OBESITY Christy Serrano is here to discuss her progress with her obesity treatment plan along with follow-up of her obesity related diagnoses. Christy Serrano is on the Category 2 Plan and states she is following her eating plan approximately 0% of the time. Aashritha states she is exercising 0 minutes 0 times per week.  Today's visit was #: 23 Starting weight: 195 lbs Starting date: 12/22/2017 Today's weight: 188 lbs Today's date: 03/06/2020 Total lbs lost to date: 7 Total lbs lost since last in-office visit: 0  Interim History: Christy Serrano is up 12 lbs since her last visit. Her hip has been hurting and she reports limited walking. She reports doing well with her water intake.  Subjective:   Vitamin D deficiency. Christy Serrano is taking OTC Vitamin D supplementation.    Ref. Range 09/29/2019 13:56  Vitamin D, 25-Hydroxy Latest Ref Range: 30.0 - 100.0 ng/mL 48.7   Prediabetes. Christy Serrano has a diagnosis of prediabetes based on her elevated HgA1c and was informed this puts her at greater risk of developing diabetes. She continues to work on diet and exercise to decrease her risk of diabetes. She denies nausea or hypoglycemia. Christy Serrano is on no medication.  Lab Results  Component Value Date   HGBA1C 5.9 (H) 09/29/2019   Lab Results  Component Value Date   INSULIN 9.2 09/29/2019   INSULIN 4.8 05/31/2018   INSULIN 13.7 12/22/2017   Assessment/Plan:   Vitamin D deficiency. Low Vitamin D level contributes to fatigue and are associated with obesity, breast, and colon cancer. She agrees to continue to take Vitamin D as directed and will follow-up for routine testing of Vitamin D, at least 2-3 times per year to avoid over-replacement.  Prediabetes. Christy Serrano will continue to work on weight loss, increasing activity, and decreasing simple carbohydrates and sweets to help decrease the risk of diabetes.   Class 1 obesity with serious comorbidity and body mass index (BMI) of 31.0 to  31.9 in adult, unspecified obesity type.  Christy Serrano is currently in the action stage of change. As such, her goal is to continue with weight loss efforts. She has agreed to the Category 2 Plan.   Handouts were provided for Eating Out and Seasonings.  She will work on meal planning, will get back on plan and be more adherent, and will use Fit Bit and increase her steps.  Exercise goals: All adults should avoid inactivity. Some physical activity is better than none, and adults who participate in any amount of physical activity gain some health benefits.  Behavioral modification strategies: increasing lean protein intake, decreasing simple carbohydrates, increasing vegetables, increasing water intake, decreasing eating out, no skipping meals, meal planning and cooking strategies, keeping healthy foods in the home and planning for success.  Christy Serrano has agreed to follow-up with our clinic in 3 months. She was informed of the importance of frequent follow-up visits to maximize her success with intensive lifestyle modifications for her multiple health conditions.   Objective:   Blood pressure 132/78, pulse 65, temperature 98.3 F (36.8 C), height 5\' 5"  (1.651 m), weight 188 lb (85.3 kg), SpO2 97 %. Body mass index is 31.28 kg/m.  General: Cooperative, alert, well developed, in no acute distress. HEENT: Conjunctivae and lids unremarkable. Cardiovascular: Regular rhythm.  Lungs: Normal work of breathing. Neurologic: No focal deficits.   Lab Results  Component Value Date   CREATININE 0.98 09/29/2019   BUN 14 09/29/2019   NA 143 09/29/2019   K 4.1 09/29/2019  CL 103 09/29/2019   CO2 25 09/29/2019   Lab Results  Component Value Date   ALT 13 09/29/2019   AST 19 09/29/2019   ALKPHOS 72 09/29/2019   BILITOT 0.6 09/29/2019   Lab Results  Component Value Date   HGBA1C 5.9 (H) 09/29/2019   HGBA1C 5.7 (H) 10/20/2018   HGBA1C 5.6 05/31/2018   HGBA1C 6.1 (H) 12/22/2017   Lab Results   Component Value Date   INSULIN 9.2 09/29/2019   INSULIN 4.8 05/31/2018   INSULIN 13.7 12/22/2017   Lab Results  Component Value Date   TSH 3.644 03/10/2018   Lab Results  Component Value Date   CHOL 197 09/29/2019   HDL 81 09/29/2019   LDLCALC 105 (H) 09/29/2019   TRIG 60 09/29/2019   Lab Results  Component Value Date   WBC 3.9 (L) 05/24/2019   HGB 11.8 (L) 05/24/2019   HCT 37.4 05/24/2019   MCV 96.1 05/24/2019   PLT 193 05/24/2019   No results found for: IRON, TIBC, FERRITIN  Attestation Statements:   Reviewed by clinician on day of visit: allergies, medications, problem list, medical history, surgical history, family history, social history, and previous encounter notes.  Time spent on visit including pre-visit chart review and post-visit charting and care was 20 minutes.   Migdalia Dk, am acting as Location manager for [employer], DO   I have reviewed the above documentation for accuracy and completeness, and I agree with the above. Jearld Lesch, DO

## 2020-03-10 ENCOUNTER — Other Ambulatory Visit: Payer: Self-pay | Admitting: Hematology and Oncology

## 2020-03-28 ENCOUNTER — Encounter: Payer: Self-pay | Admitting: Hematology and Oncology

## 2020-03-28 ENCOUNTER — Other Ambulatory Visit: Payer: Self-pay | Admitting: *Deleted

## 2020-03-28 MED ORDER — ESTRADIOL 0.025 MG/24HR TD PTTW: MEDICATED_PATCH | TRANSDERMAL | 3 refills | Status: DC

## 2020-05-21 ENCOUNTER — Other Ambulatory Visit (HOSPITAL_COMMUNITY): Payer: Self-pay | Admitting: Orthopedic Surgery

## 2020-05-21 MED FILL — CYCLOBENZAPRINE HCL 10 MG T: 10 | 30 days supply | Qty: 30 | Fill #0

## 2020-06-04 ENCOUNTER — Ambulatory Visit (INDEPENDENT_AMBULATORY_CARE_PROVIDER_SITE_OTHER): Payer: Managed Care, Other (non HMO) | Admitting: Bariatrics

## 2020-06-04 ENCOUNTER — Other Ambulatory Visit: Payer: Self-pay

## 2020-06-04 ENCOUNTER — Encounter (INDEPENDENT_AMBULATORY_CARE_PROVIDER_SITE_OTHER): Payer: Self-pay | Admitting: Bariatrics

## 2020-06-04 VITALS — BP 131/70 | HR 65 | Temp 97.7°F | Ht 65.0 in | Wt 184.0 lb

## 2020-06-04 DIAGNOSIS — E8881 Metabolic syndrome: Secondary | ICD-10-CM | POA: Diagnosis not present

## 2020-06-04 DIAGNOSIS — E669 Obesity, unspecified: Secondary | ICD-10-CM | POA: Diagnosis not present

## 2020-06-04 DIAGNOSIS — Z6830 Body mass index (BMI) 30.0-30.9, adult: Secondary | ICD-10-CM

## 2020-06-04 DIAGNOSIS — E559 Vitamin D deficiency, unspecified: Secondary | ICD-10-CM

## 2020-06-05 ENCOUNTER — Ambulatory Visit (INDEPENDENT_AMBULATORY_CARE_PROVIDER_SITE_OTHER): Payer: Managed Care, Other (non HMO) | Admitting: Bariatrics

## 2020-06-07 NOTE — Progress Notes (Signed)
Chief Complaint:   OBESITY Christy Serrano is here to discuss her progress with her obesity treatment plan along with follow-up of her obesity related diagnoses. Christy Serrano is on the Category 2 Plan and states she is following her eating plan approximately 0% of the time. Christy Serrano states she is doing 0 minutes 0 times per week.  Today's visit was #: 24 Starting weight: 195 lbs Starting date: 12/22/2017 Today's weight: 184 lbs Today's date: 06/04/2020 Total lbs lost to date: 11 Total lbs lost since last in-office visit: 4  Interim History: Christy Serrano is down 4 lbs since her last visit. Her last visit was on 03/06/2020. She has been drinking more water and eating more protein.  Subjective:   1. Vitamin D deficiency Christy Serrano is currently taking OTC Vit D.  2. Insulin resistance Christy Serrano is not on medications currently.  Assessment/Plan:   1. Vitamin D deficiency Low Vitamin D level contributes to fatigue and are associated with obesity, breast, and colon cancer. Christy Serrano agreed to continue taking OTC Vitamin D and will follow-up for routine testing of Vitamin D, at least 2-3 times per year to avoid over-replacement.  2. Insulin resistance Christy Serrano will continue to work on weight loss, increase exercise, and decreasing simple carbohydrates to help decrease the risk of diabetes. Christy Serrano agreed to follow-up with [place] as directed to closely monitor her progress.  3. Class 1 obesity with serious comorbidity and body mass index (BMI) of 30.0 to 30.9 in adult, unspecified obesity type Christy Serrano is currently in the action stage of change. As such, her goal is to continue with weight loss efforts. She has agreed to keeping a food journal and adhering to recommended goals of 1200 calories and 70-80 grams of protein.   Exercise goals: She will consider getting back to walking. She has a FitBit. Recipes II was given today.  Behavioral modification strategies: increasing lean protein intake, decreasing  simple carbohydrates, increasing vegetables, increasing water intake, decreasing eating out, no skipping meals, meal planning and cooking strategies, keeping healthy foods in the home, dealing with family or coworker sabotage, travel eating strategies, holiday eating strategies  and celebration eating strategies.  Christy Serrano has agreed to follow-up with our clinic in 2 months. She was informed of the importance of frequent follow-up visits to maximize her success with intensive lifestyle modifications for her multiple health conditions.   Objective:   Blood pressure 131/70, pulse 65, temperature 97.7 F (36.5 C), height 5\' 5"  (1.651 m), weight 184 lb (83.5 kg), SpO2 99 %. Body mass index is 30.62 kg/m.  General: Cooperative, alert, well developed, in no acute distress. HEENT: Conjunctivae and lids unremarkable. Cardiovascular: Regular rhythm.  Lungs: Normal work of breathing. Neurologic: No focal deficits.   Lab Results  Component Value Date   CREATININE 0.98 09/29/2019   BUN 14 09/29/2019   NA 143 09/29/2019   K 4.1 09/29/2019   CL 103 09/29/2019   CO2 25 09/29/2019   Lab Results  Component Value Date   ALT 13 09/29/2019   AST 19 09/29/2019   ALKPHOS 72 09/29/2019   BILITOT 0.6 09/29/2019   Lab Results  Component Value Date   HGBA1C 5.9 (H) 09/29/2019   HGBA1C 5.7 (H) 10/20/2018   HGBA1C 5.6 05/31/2018   HGBA1C 6.1 (H) 12/22/2017   Lab Results  Component Value Date   INSULIN 9.2 09/29/2019   INSULIN 4.8 05/31/2018   INSULIN 13.7 12/22/2017   Lab Results  Component Value Date   TSH 3.644 03/10/2018  Lab Results  Component Value Date   CHOL 197 09/29/2019   HDL 81 09/29/2019   LDLCALC 105 (H) 09/29/2019   TRIG 60 09/29/2019   Lab Results  Component Value Date   WBC 3.9 (L) 05/24/2019   HGB 11.8 (L) 05/24/2019   HCT 37.4 05/24/2019   MCV 96.1 05/24/2019   PLT 193 05/24/2019   No results found for: IRON, TIBC, FERRITIN  Attestation Statements:    Reviewed by clinician on day of visit: allergies, medications, problem list, medical history, surgical history, family history, social history, and previous encounter notes.  Time spent on visit including pre-visit chart review and post-visit care and charting was 20 minutes.    Wilhemena Durie, am acting as Location manager for [employer], DO.  I have reviewed the above documentation for accuracy and completeness, and I agree with the above. Jearld Lesch, DO

## 2020-06-11 ENCOUNTER — Encounter (INDEPENDENT_AMBULATORY_CARE_PROVIDER_SITE_OTHER): Payer: Self-pay | Admitting: Bariatrics

## 2020-06-28 IMAGING — MG DIGITAL DIAGNOSTIC BILAT W/ TOMO W/ CAD
9 series · 9 of 25 positions shown · non-contrast
Comparison: 07/12/2018 and earlier

CLINICAL DATA: LEFT lumpectomy with radiation therapy and
chemotherapy June 2018.

EXAM:
DIGITAL DIAGNOSTIC BILATERAL MAMMOGRAM WITH CAD AND TOMO

[L CC]
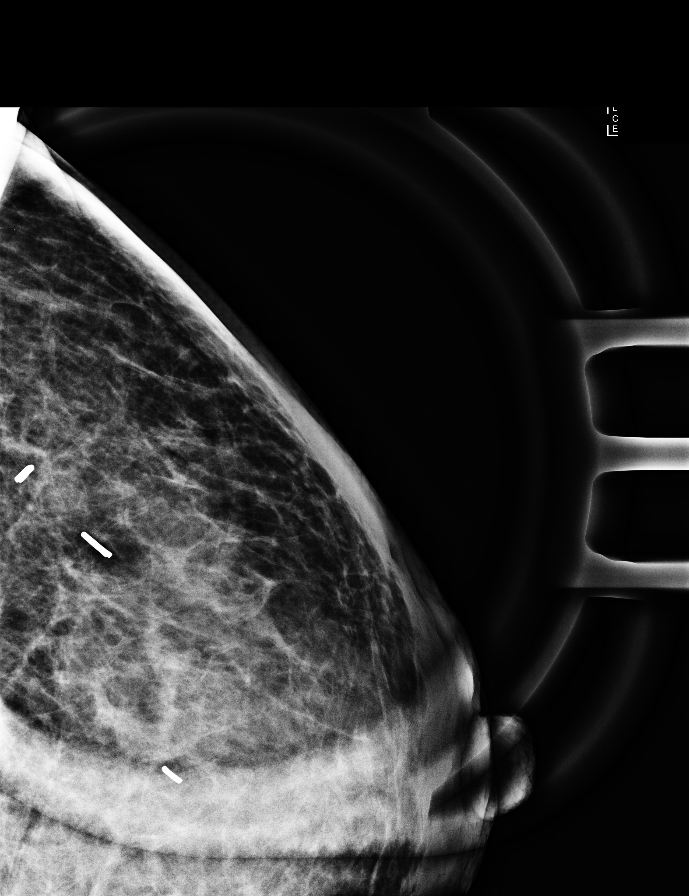

[R MLO synth-2D]
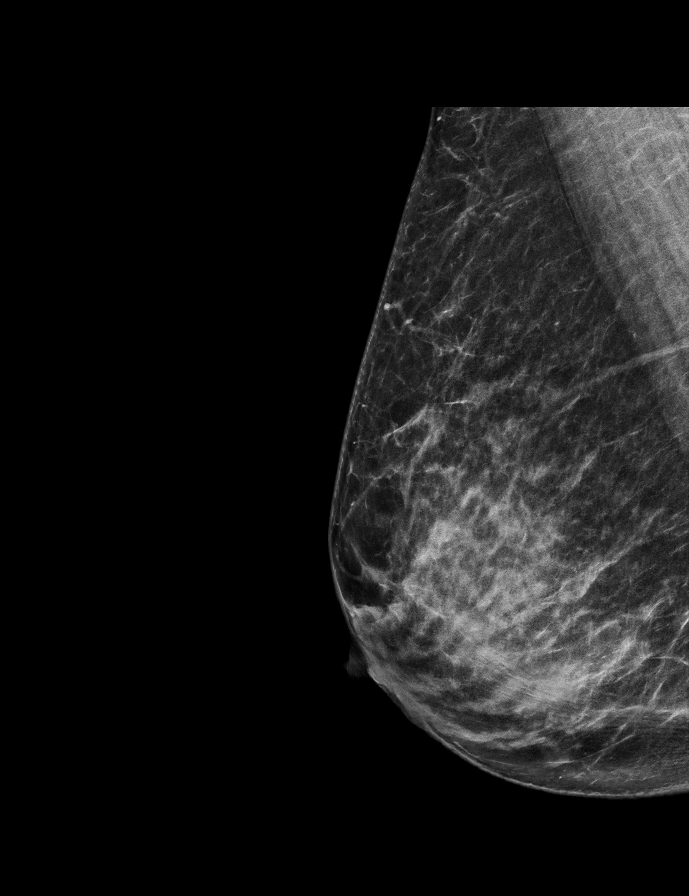

[L MLO synth-2D]
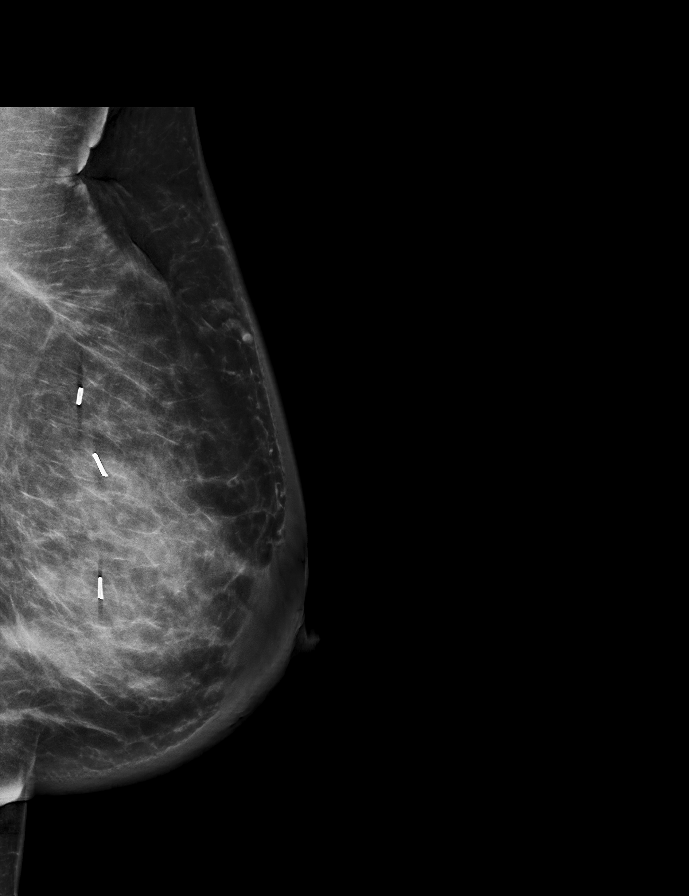

[R CC synth-2D]
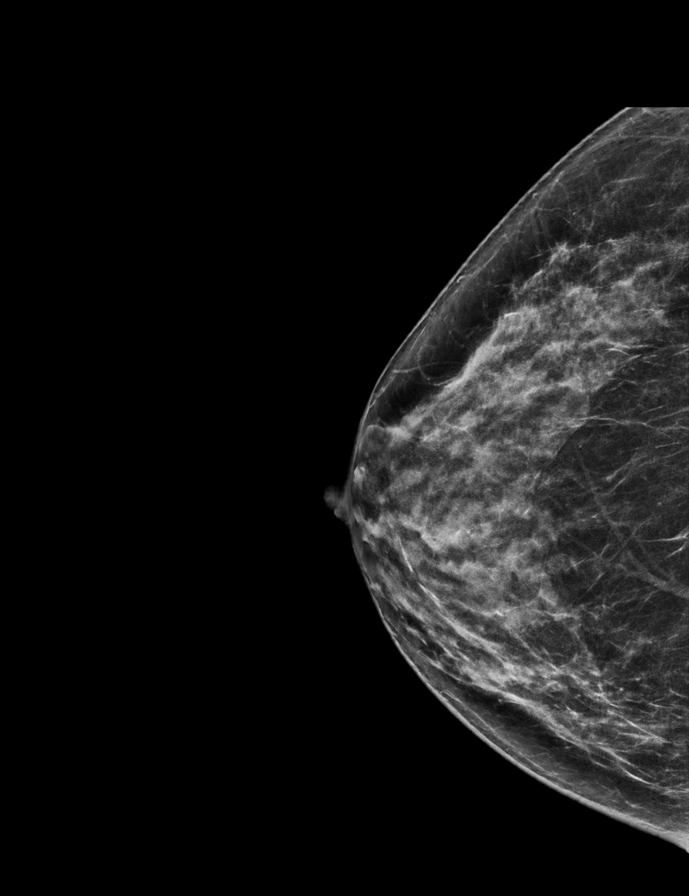

[L CC synth-2D]
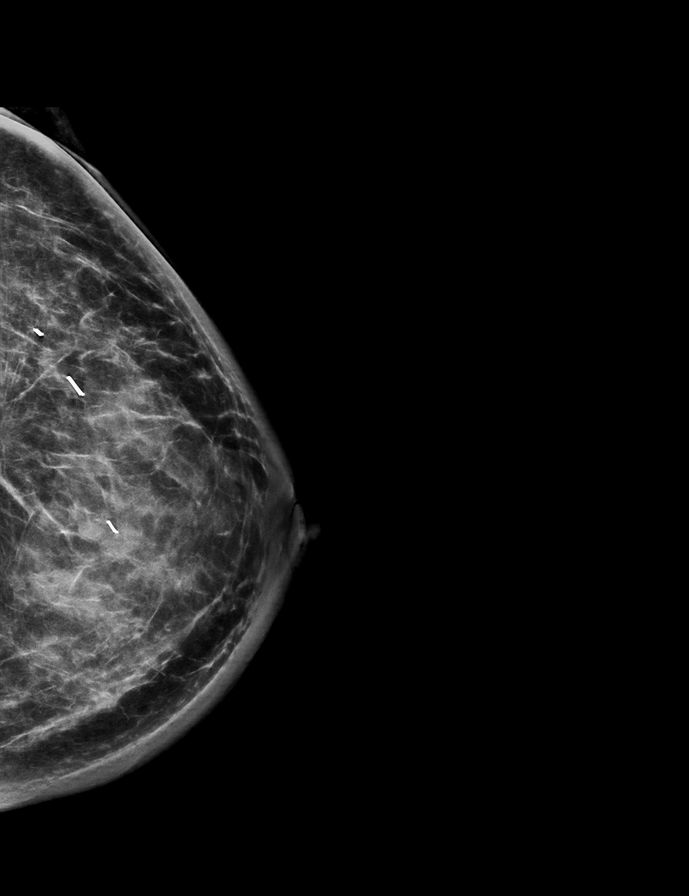

[R CC tomo · tomo slice 31/62.0]
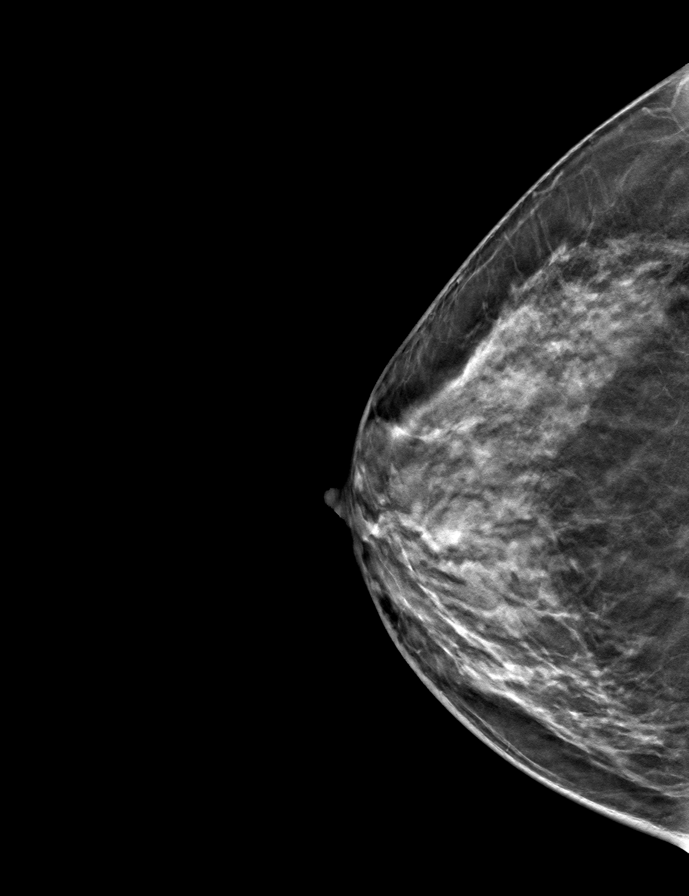

[L CC tomo · tomo slice 43/86.0]
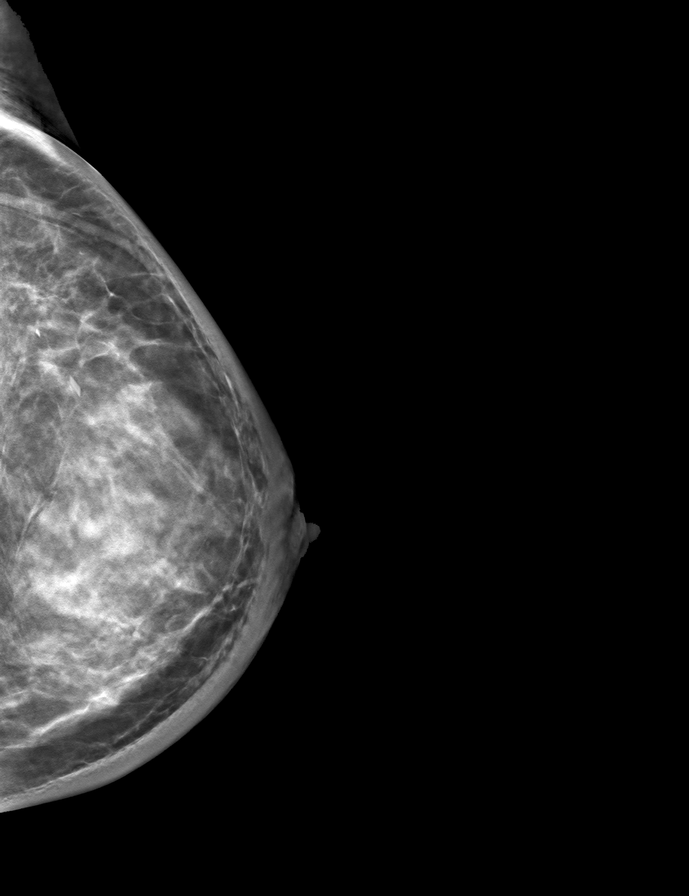

[L MLO tomo · tomo slice 47/94.0]
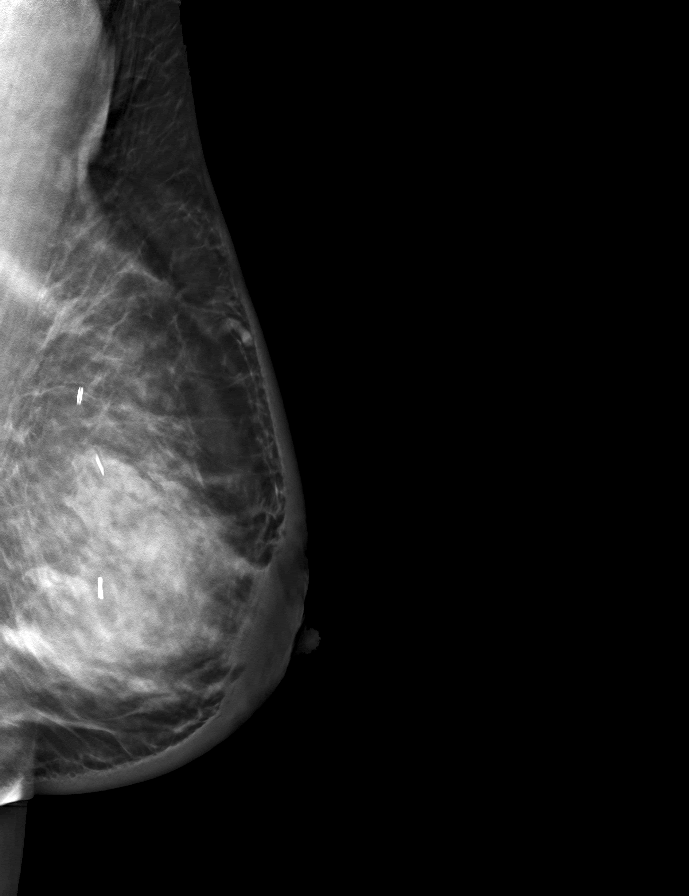

[R MLO tomo · tomo slice 33/64.0]
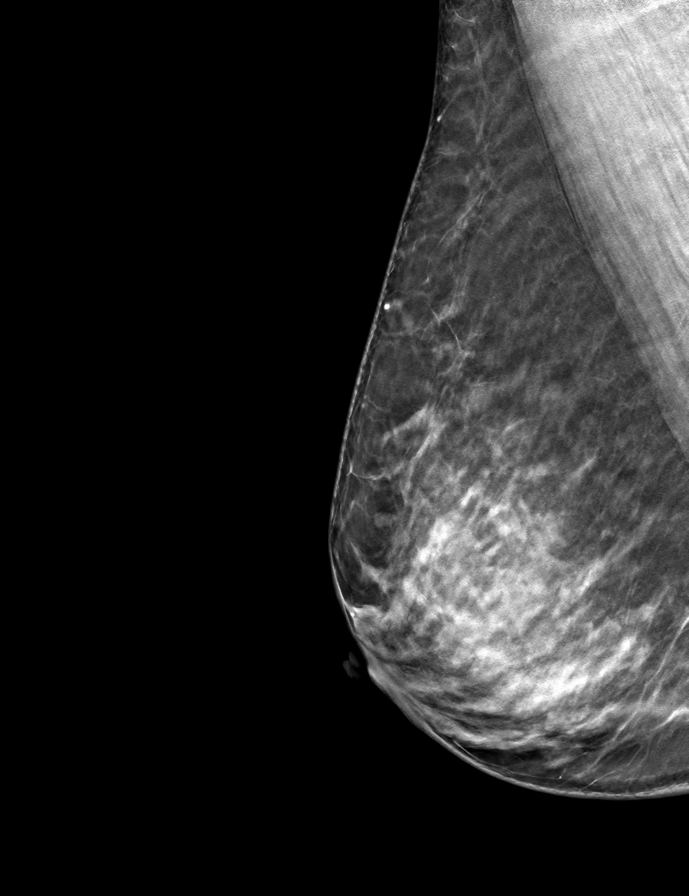

[9 of 25 positions shown; findings below may reference images not displayed]

ACR Breast Density Category c: The breast tissue is heterogeneously
dense, which may obscure small masses.
FINDINGS: Post operative changes are seen in the LEFTbreast. Skin and
trabecular thickening in the LEFT breast are consistent with recent
radiation treatment. No evidence for malignancy in either breast.

Mammographic images were processed with CAD.
IMPRESSION: No mammographic evidence for malignancy.

RECOMMENDATION:
Diagnostic mammogram is suggested in 1 year. (Code:P8-8-KXA)

I have discussed the findings and recommendations with the patient.
If applicable, a reminder letter will be sent to the patient
regarding the next appointment.

BI-RADS CATEGORY  2: Benign.

## 2020-07-05 ENCOUNTER — Ambulatory Visit: Payer: Managed Care, Other (non HMO) | Attending: Internal Medicine

## 2020-07-05 DIAGNOSIS — Z23 Encounter for immunization: Secondary | ICD-10-CM

## 2020-07-05 NOTE — Progress Notes (Signed)
   Covid-19 Vaccination Clinic  Name:  DAMARIS ABELN    MRN: [id number] DOB: May 30, 1955  07/05/2020  Ms. Seckman was observed post Covid-19 immunization for 15 minutes without incident. She was provided with Vaccine Information Sheet and instruction to access the V-Safe system.   Ms. Boulais was instructed to call 911 with any severe reactions post vaccine: Marland Kitchen Difficulty breathing  . Swelling of face and throat  . A fast heartbeat  . A bad rash all over body  . Dizziness and weakness   Immunizations Administered    Name Date Dose VIS Date Route   PFIZER Comrnaty(Gray TOP) Covid-19 Vaccine 07/05/2020  1:09 PM 0.3 mL 03/01/2020 Intramuscular   Manufacturer: [place]   Lot: [id number]   NDC: [phone]

## 2020-07-09 ENCOUNTER — Other Ambulatory Visit (HOSPITAL_BASED_OUTPATIENT_CLINIC_OR_DEPARTMENT_OTHER): Payer: Self-pay

## 2020-07-09 MED ORDER — PFIZER-BIONT COVID-19 VAC-TRIS 30 MCG/0.3ML IM SUSP
INTRAMUSCULAR | 0 refills | Status: DC
  Filled 2020-07-09: qty 0.3, 1d supply, fill #0

## 2020-07-20 ENCOUNTER — Other Ambulatory Visit (HOSPITAL_BASED_OUTPATIENT_CLINIC_OR_DEPARTMENT_OTHER): Payer: Self-pay

## 2020-08-06 ENCOUNTER — Ambulatory Visit (INDEPENDENT_AMBULATORY_CARE_PROVIDER_SITE_OTHER): Payer: Managed Care, Other (non HMO) | Admitting: Bariatrics

## 2020-08-06 ENCOUNTER — Other Ambulatory Visit: Payer: Self-pay

## 2020-08-06 ENCOUNTER — Encounter (INDEPENDENT_AMBULATORY_CARE_PROVIDER_SITE_OTHER): Payer: Self-pay | Admitting: Bariatrics

## 2020-08-06 VITALS — BP 127/74 | HR 67 | Temp 98.6°F | Ht 65.0 in | Wt 186.0 lb

## 2020-08-06 DIAGNOSIS — R7989 Other specified abnormal findings of blood chemistry: Secondary | ICD-10-CM | POA: Diagnosis not present

## 2020-08-06 DIAGNOSIS — E559 Vitamin D deficiency, unspecified: Secondary | ICD-10-CM

## 2020-08-06 DIAGNOSIS — R7303 Prediabetes: Secondary | ICD-10-CM

## 2020-08-06 DIAGNOSIS — E6609 Other obesity due to excess calories: Secondary | ICD-10-CM | POA: Diagnosis not present

## 2020-08-06 DIAGNOSIS — Z9189 Other specified personal risk factors, not elsewhere classified: Secondary | ICD-10-CM

## 2020-08-06 DIAGNOSIS — Z6832 Body mass index (BMI) 32.0-32.9, adult: Secondary | ICD-10-CM

## 2020-08-07 ENCOUNTER — Encounter (INDEPENDENT_AMBULATORY_CARE_PROVIDER_SITE_OTHER): Payer: Self-pay | Admitting: Bariatrics

## 2020-08-07 LAB — LIPID PANEL WITH LDL/HDL RATIO
Cholesterol, Total: 187 mg/dL (ref 100–199)
HDL: 79 mg/dL (ref >39)
LDL Chol Calc (NIH): 93 mg/dL (ref 0–99)
LDL/HDL Ratio: 1.2 ratio (ref 0.0–3.2)
Triglycerides: 83 mg/dL (ref 0–149)
VLDL Cholesterol Cal: 15 mg/dL (ref 5–40)

## 2020-08-07 LAB — COMPREHENSIVE METABOLIC PANEL
ALT: 19 IU/L (ref 0–32)
AST: 19 IU/L (ref 0–40)
Albumin/Globulin Ratio: 1.7 (ref 1.2–2.2)
Albumin: 4.5 g/dL (ref 3.8–4.8)
Alkaline Phosphatase: 87 IU/L (ref 44–121)
BUN/Creatinine Ratio: 13 (ref 12–28)
BUN: 13 mg/dL (ref 8–27)
Bilirubin Total: 0.6 mg/dL (ref 0.0–1.2)
CO2: 24 mmol/L (ref 20–29)
Calcium: 9.4 mg/dL (ref 8.7–10.3)
Chloride: 104 mmol/L (ref 96–106)
Creatinine, Ser: 0.99 mg/dL (ref 0.57–1.00)
Globulin, Total: 2.7 g/dL (ref 1.5–4.5)
Glucose: 86 mg/dL (ref 65–99)
Potassium: 4.1 mmol/L (ref 3.5–5.2)
Sodium: 143 mmol/L (ref 134–144)
Total Protein: 7.2 g/dL (ref 6.0–8.5)
eGFR: 64 mL/min/{1.73_m2} (ref >59)

## 2020-08-07 LAB — VITAMIN D 25 HYDROXY (VIT D DEFICIENCY, FRACTURES): Vit D, 25-Hydroxy: 48.2 ng/mL (ref 30.0–100.0)

## 2020-08-07 LAB — INSULIN, RANDOM: INSULIN: 10.2 u[IU]/mL (ref 2.6–24.9)

## 2020-08-07 LAB — HEMOGLOBIN A1C
Est. average glucose Bld gHb Est-mCnc: 128 mg/dL
Hgb A1c MFr Bld: 6.1 % — ABNORMAL HIGH (ref 4.8–5.6)

## 2020-08-07 NOTE — Progress Notes (Signed)
Chief Complaint:   OBESITY Christy Serrano is here to discuss her progress with her obesity treatment plan along with follow-up of her obesity related diagnoses. Christy Serrano is on keeping a food journal and adhering to recommended goals of 1200 calories and 70-80 protein and states she is following her eating plan approximately 0% of the time. Christy Serrano states she is not currently exercising.  Today's visit was #: 25 Starting weight: 195 lbs Starting date: 12/22/2017 Today's weight: 186 lbs Today's date: 08/06/2020 Total lbs lost to date: 9 Total lbs lost since last in-office visit: 0  Interim History: Christy Serrano is up 2 lbs since her last visit.  Subjective:   1. Elevated LFTs Christy Serrano has a new dx of elevated ALT. Her BMI is over 40. She denies abdominal pain or jaundice and has never been told of any liver problems in the past. She denies excessive alcohol intake.  Lab Results  Component Value Date   ALT 19 08/06/2020   AST 19 08/06/2020   ALKPHOS 87 08/06/2020   BILITOT 0.6 08/06/2020   2. Pre-diabetes Christy Serrano has a diagnosis of prediabetes based on her elevated HgA1c and was informed this puts her at greater risk of developing diabetes. She continues to work on diet and exercise to decrease her risk of diabetes. She denies nausea or hypoglycemia.  Lab Results  Component Value Date   HGBA1C 6.1 (H) 08/06/2020   Lab Results  Component Value Date   INSULIN 10.2 08/06/2020   INSULIN 9.2 09/29/2019   INSULIN 4.8 05/31/2018   INSULIN 13.7 12/22/2017    3. Vitamin D deficiency Christy Serrano's Vitamin D level was 48.7 on 09/29/2019. She is currently taking OTC vitamin D 1000 IU each day. She denies nausea, vomiting or muscle weakness.  4. At risk for osteoporosis Christy Serrano is at higher risk of osteopenia and osteoporosis due to Vitamin D deficiency.   Assessment/Plan:   1. Elevated LFTs We discussed the likely diagnosis of non-alcoholic fatty liver disease today and how this  condition is obesity related. Judine was educated the importance of weight loss. Tanija agreed to continue with her weight loss efforts with healthier diet and exercise as an essential part of her treatment plan. Check labs today.  - Lipid Panel With LDL/HDL Ratio - Comprehensive metabolic panel  2. Pre-diabetes Christy Serrano will continue to work on weight loss, exercise, and decreasing simple carbohydrates to help decrease the risk of diabetes. Check labs today.   - Insulin, random - Hemoglobin A1c  3. Vitamin D deficiency Low Vitamin D level contributes to fatigue and are associated with obesity, breast, and colon cancer. She agrees to continue to take OTC Vitamin D @1 ,000 IU daily and will follow-up for routine testing of Vitamin D, at least 2-3 times per year to avoid over-replacement. Check labs today.  - VITAMIN D 25 Hydroxy (Vit-D Deficiency, Fractures)  4. At risk for osteoporosis Christy Serrano was given approximately 15 minutes of osteoporosis prevention counseling today. Christy Serrano is at risk for osteopenia and osteoporosis due to her Vitamin D deficiency. She was encouraged to take her Vitamin D and follow her higher calcium diet and increase strengthening exercise to help strengthen her bones and decrease her risk of osteopenia and osteoporosis.  Repetitive spaced learning was employed today to elicit superior memory formation and behavioral change.  5. Obesity, current BMI 31  Christy Serrano is currently in the action stage of change. As such, her goal is to continue with weight loss efforts. She has agreed to practicing portion  control and making smarter food choices, such as increasing vegetables and decreasing simple carbohydrates.   Meal plan Intentional eating Decrease carbohydrates  Exercise goals: As is- pt had a let hip injection but is still experiencing pain.  Behavioral modification strategies: increasing lean protein intake, decreasing simple carbohydrates, increasing  vegetables, increasing water intake, decreasing eating out, no skipping meals, meal planning and cooking strategies, keeping healthy foods in the home and planning for success.  Christy Serrano has agreed to follow-up with our clinic in 3 months. She was informed of the importance of frequent follow-up visits to maximize her success with intensive lifestyle modifications for her multiple health conditions.   Christy Serrano was informed we would discuss her lab results at her next visit unless there is a critical issue that needs to be addressed sooner. Christy Serrano agreed to keep her next visit at the agreed upon time to discuss these results.  Objective:   Blood pressure 127/74, pulse 67, temperature 98.6 F (37 C), height 5\' 5"  (1.651 m), weight 186 lb (84.4 kg), SpO2 97 %. Body mass index is 30.95 kg/m.  General: Cooperative, alert, well developed, in no acute distress. HEENT: Conjunctivae and lids unremarkable. Cardiovascular: Regular rhythm.  Lungs: Normal work of breathing. Neurologic: No focal deficits.   Lab Results  Component Value Date   CREATININE 0.99 08/06/2020   BUN 13 08/06/2020   NA 143 08/06/2020   K 4.1 08/06/2020   CL 104 08/06/2020   CO2 24 08/06/2020   Lab Results  Component Value Date   ALT 19 08/06/2020   AST 19 08/06/2020   ALKPHOS 87 08/06/2020   BILITOT 0.6 08/06/2020   Lab Results  Component Value Date   HGBA1C 6.1 (H) 08/06/2020   HGBA1C 5.9 (H) 09/29/2019   HGBA1C 5.7 (H) 10/20/2018   HGBA1C 5.6 05/31/2018   HGBA1C 6.1 (H) 12/22/2017   Lab Results  Component Value Date   INSULIN 10.2 08/06/2020   INSULIN 9.2 09/29/2019   INSULIN 4.8 05/31/2018   INSULIN 13.7 12/22/2017   Lab Results  Component Value Date   TSH 3.644 03/10/2018   Lab Results  Component Value Date   CHOL 187 08/06/2020   HDL 79 08/06/2020   LDLCALC 93 08/06/2020   TRIG 83 08/06/2020   Lab Results  Component Value Date   WBC 3.9 (L) 05/24/2019   HGB 11.8 (L) 05/24/2019   HCT  37.4 05/24/2019   MCV 96.1 05/24/2019   PLT 193 05/24/2019   No results found for: IRON, TIBC, FERRITIN   Attestation Statements:   Reviewed by clinician on day of visit: allergies, medications, problem list, medical history, surgical history, family history, social history, and previous encounter notes.  Coral Ceo, CMA, am acting as Location manager for [employer], DO.  I have reviewed the above documentation for accuracy and completeness, and I agree with the above. Jearld Lesch, DO

## 2020-10-16 ENCOUNTER — Other Ambulatory Visit (HOSPITAL_BASED_OUTPATIENT_CLINIC_OR_DEPARTMENT_OTHER): Payer: Self-pay

## 2020-10-16 ENCOUNTER — Encounter: Payer: Self-pay | Admitting: Hematology and Oncology

## 2020-10-16 MED ORDER — MELOXICAM 15 MG PO TABS
ORAL_TABLET | ORAL | 0 refills | Status: DC
  Filled 2020-10-16: qty 30, 30d supply, fill #0

## 2020-11-19 ENCOUNTER — Encounter: Payer: Self-pay | Admitting: Hematology and Oncology

## 2020-11-19 ENCOUNTER — Other Ambulatory Visit (HOSPITAL_BASED_OUTPATIENT_CLINIC_OR_DEPARTMENT_OTHER): Payer: Self-pay

## 2020-11-19 MED ORDER — MELOXICAM 15 MG PO TABS
ORAL_TABLET | ORAL | 2 refills | Status: DC
  Filled 2020-11-19 (×2): qty 30, 30d supply, fill #0
  Filled 2021-01-09: qty 30, 30d supply, fill #1

## 2020-11-27 ENCOUNTER — Other Ambulatory Visit (HOSPITAL_BASED_OUTPATIENT_CLINIC_OR_DEPARTMENT_OTHER): Payer: Self-pay

## 2020-11-29 ENCOUNTER — Ambulatory Visit: Payer: Managed Care, Other (non HMO) | Admitting: Hematology and Oncology

## 2020-12-05 NOTE — Progress Notes (Signed)
Patient Care Team: Velna Hatchet, MD as PCP - General (Internal Medicine) Jovita Kussmaul, MD as Consulting Physician (General Surgery) Nicholas Lose, MD as Consulting Physician (Hematology and Oncology) Eppie Gibson, MD as Attending Physician (Radiation Oncology) Bensimhon, Shaune Pascal, MD as Consulting Physician (Cardiology)  DIAGNOSIS:    ICD-10-CM   1. Carcinoma of upper-outer quadrant of left breast in female, estrogen receptor negative ([place])  C50.412    Z17.1       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 breast ([address])  01/18/2018 Initial Diagnosis   Screening mammogram detected microcalcifications and asymmetry, left breast segmental microcalcifications UOQ 1.5 x 4.3 x 5.4 cm biopsy-proven IDC with DCIS with LV I, grade 3, ER 0%, PR 0%, Ki-67 30%, HER-2 +3+ by IHC; hypoechoic mass 2 o'clock position 1.2 x 1.3 x 1.7 cm biopsy IDC, LV I present, grade 2-3, ER 0%, PR 0%, Ki-67 40%, HER-2 +3+, left axillary lymph node positive (3 nodes noted by [place]) T3 N1 stage IIIA   02/03/2018 Cancer Staging   Staging form: Breast, AJCC 8th Edition - Clinical: Stage IIIA (cT3, cN1, cM0, G3, ER-, PR-, HER2+) - Signed by Nicholas Lose, MD on 02/03/2018   02/20/2018 Genetic Testing   No pathogenic variants identified on the [employer] Cancer[employer] Expanded + RNA insight panel. Genes Analyzed (67 total): AIP, ALK, APC*, ATM*, BAP1, BARD1, BLM, BMPR1A, BRCA1*, BRCA2*, BRIP1*, CDH1*, CDK4, CDKN1B, CDKN2A, CHEK2*, DICER1, FANCC, FH, FLCN, GALNT12, HOXB13, MAX, MEN1, MET, MLH1*, MRE11A, MSH2*, MSH6*, MUTYH*, NBN, NF1*, NF2, PALB2*, PHOX2B, PMS2*, POLD1, POLE, POT1, PRKAR1A, PTCH1, PTEN*, RAD50, RAD51C*, RAD51D*, RB1, RET, SDHA, SDHAF2, SDHB, SDHC, SDHD, SMAD4, SMARCA4, SMARCB1, SMARCE1, STK11, SUFU, TMEM127, TP53*, TSC1, TSC2, VHL and XRCC2 (sequencing and deletion/duplication); MITF (sequencing only); EPCAM and GREM1 (deletion/duplication only). DNA and RNA analyses performed  for * genes. The report date is 02/20/2018.   03/04/2018 -  Neo-Adjuvant Chemotherapy   Neoadjuvant chemotherapy with [facility] Perjeta   03/08/2018 - 03/10/2018 Hospital Admission   Admitted for recurrent syncope with loss of consciousness for up to 5 minutes with loss of bladder, no abnormalities identified.  MRI brain, carotid ultrasound, EEG studies were negative.   06/17/2018 Breast MRI   No suspicious residual enhancement identified in the left breast, compatible with treatment response, interval decrease in size of left axillary lymph nodes   07/12/2018 Surgery   Lumpectomy (Dr. Marlou Starks): complete treatment response, no residual carcinoma present in breast or 11/11 lymph nodes.    09/02/2018 - 10/14/2018 Radiation Therapy   Adjuvant XRT   Carcinoma of upper-outer quadrant of left breast in female, estrogen receptor negative ([place])  07/12/2018 Initial Diagnosis   Carcinoma of upper-outer quadrant of left breast in female, estrogen receptor negative ([place])   08/11/2018 Cancer Staging   Staging form: Breast, AJCC 8th Edition - Pathologic: No Stage Recommended (ypT0, pN0) - Signed by Eppie Gibson, MD on 08/11/2018   08/11/2018 Cancer Staging   Staging form: Breast, AJCC 8th Edition - Clinical: Stage IIIA (cT3, cN1, cM0, G3, ER-, PR-, HER2+) - Signed by Eppie Gibson, MD on 08/11/2018   08/20/2018 -  Chemotherapy   The patient had trastuzumab (HERCEPTIN) 450 mg in sodium chloride 0.9 % 250 mL chemo infusion, 462 mg (100 % of original dose 6 mg/kg), Intravenous,  Once, 9 of 9 cycles Dose modification: 6 mg/kg (original dose 6 mg/kg, Cycle 1, Reason: Provider Judgment), 450 mg (original dose 450 mg, Cycle 6, Reason: Other (see comments), Comment: pt assist  for Herceptin) Administration: 450 mg (08/20/2018), 450 mg (09/10/2018), 450 mg (10/01/2018), 450 mg (10/22/2018), 450 mg (12/03/2018), 450 mg (12/24/2018), 450 mg (01/14/2019), 450 mg (02/04/2019), 450 mg (02/25/2019) pertuzumab (PERJETA) 420 mg in sodium  chloride 0.9 % 250 mL chemo infusion, 420 mg (100 % of original dose 420 mg), Intravenous, Once, 10 of 10 cycles Dose modification: 420 mg (original dose 420 mg, Cycle 1, Reason: Provider Judgment) Administration: 420 mg (08/20/2018), 420 mg (09/10/2018), 420 mg (11/12/2018), 420 mg (10/01/2018), 420 mg (10/22/2018), 420 mg (12/03/2018), 420 mg (12/24/2018), 420 mg (01/14/2019), 420 mg (02/04/2019), 420 mg (02/25/2019) trastuzumab-dkst (OGIVRI) 450 mg in sodium chloride 0.9 % 250 mL chemo infusion, 450 mg (100 % of original dose 450 mg), Intravenous,  Once, 2 of 2 cycles Dose modification: 450 mg (original dose 450 mg, Cycle 5, Reason: Other (see comments), Comment: Biosimilar Conversion), 6 mg/kg (original dose 6 mg/kg, Cycle 7, Reason: Other (see comments), Comment: to biosimalar) Administration: 450 mg (11/12/2018)   for chemotherapy treatment.       CHIEF COMPLIANT:  Follow-up of left breast cancer on surveillance  INTERVAL HISTORY: Christy Serrano is a 65 y.o. with above-mentioned history of left breast cancer treated with neoadjuvant chemotherapy, lumpectomy, radiation, Herceptin Perjeta maintenance, and is currently on surveillance. Mammogram on 01/09/2020 showed no evidence of malignancy. She presents to the clinic today for follow-up.   ALLERGIES:  is allergic to gabapentin, amoxicillin, and oxycodone.  MEDICATIONS:  Current Outpatient Medications  Medication Sig Dispense Refill   acetaminophen (TYLENOL) 500 MG tablet Take 1,000 mg by mouth every 6 (six) hours as needed for mild pain.     albuterol (PROVENTIL HFA;VENTOLIN HFA) 108 (90 Base) MCG/ACT inhaler Inhale into the lungs every 6 (six) hours as needed for wheezing or shortness of breath.     budesonide-formoterol (SYMBICORT) 160-4.5 MCG/ACT inhaler Inhale 2 puffs into the lungs 2 (two) times daily.     Calcium-Phosphorus-Vitamin D (CITRACAL +D3 PO) Take 1,200 mcg by mouth.     cholecalciferol (VITAMIN D) 1000 units tablet Take 1,000  Units by mouth daily.     COVID-19 mRNA Vac-TriS, Pfizer, (PFIZER-BIONT COVID-19 VAC-TRIS) SUSP injection Inject into the muscle. 0.3 mL 0   cyclobenzaprine (FLEXERIL) 10 MG tablet TAKE 1 TABLET (10 MG TOTAL) BY MOUTH NIGHTLY AS NEEDED FOR UP TO 30 DAYS FOR MUSCLE SPASMS. 30 tablet 0   estradiol (DOTTI) 0.025 MG/24HR APPLY 1 PATCH TOPICALLY TO  SKIN TWICE WEEKLY 24 patch 3   HYDROcodone-acetaminophen (NORCO/VICODIN) 5-325 MG tablet Take 1 tablet by mouth every 6 (six) hours as needed for moderate pain or severe pain. 10 tablet 0   meloxicam (MOBIC) 15 MG tablet Take 1 tablet (15 mg total) by mouth daily for 30 days. 30 tablet 2   methocarbamol (ROBAXIN) 500 MG tablet 1 po q 8 hrs prn 30 tablet 0   Multiple Vitamin (MULTIVITAMIN) capsule Take 1 capsule by mouth daily.     No current facility-administered medications for this visit.    PHYSICAL EXAMINATION: ECOG PERFORMANCE STATUS: 1 - Symptomatic but completely ambulatory  Vitals:   12/06/20 1510  BP: (!) 153/73  Pulse: 81  Resp: 19  Temp: (!) 97.5 F (36.4 C)  SpO2: 99%   Filed Weights   12/06/20 1510  Weight: 193 lb 6.4 oz (87.7 kg)    BREAST: No palpable masses or nodules in either right or left breasts. No palpable axillary supraclavicular or infraclavicular adenopathy no breast tenderness or nipple discharge. (exam performed in  the presence of a chaperone)  LABORATORY DATA:  I have reviewed the data as listed CMP Latest Ref Rng & Units 08/06/2020 09/29/2019 05/24/2019  Glucose 65 - 99 mg/dL 86 88 127(H)  BUN 8 - 27 mg/dL 13 14 19   Creatinine 0.57 - 1.00 mg/dL 0.99 0.98 1.19(H)  Sodium 134 - 144 mmol/L 143 143 138  Potassium 3.5 - 5.2 mmol/L 4.1 4.1 3.9  Chloride 96 - 106 mmol/L 104 103 104  CO2 20 - 29 mmol/L 24 25 28   Calcium 8.7 - 10.3 mg/dL 9.4 9.3 9.5  Total Protein 6.0 - 8.5 g/dL 7.2 7.3 7.5  Total Bilirubin 0.0 - 1.2 mg/dL 0.6 0.6 0.6  Alkaline Phos 44 - 121 IU/L 87 72 72  AST 0 - 40 IU/L 19 19 17   ALT 0 - 32 IU/L  19 13 12     Lab Results  Component Value Date   WBC 3.9 (L) 05/24/2019   HGB 11.8 (L) 05/24/2019   HCT 37.4 05/24/2019   MCV 96.1 05/24/2019   PLT 193 05/24/2019   NEUTROABS 2.2 05/24/2019    ASSESSMENT & PLAN:  Carcinoma of upper-outer quadrant of left breast in female, estrogen receptor negative ([place]) 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, grade 3, ER 0%, PR 0%, Ki-67 30%, HER-2 +3+ by IHC; hypoechoic mass 2 o'clock position 1.2 x 1.3 x 1.7 cm biopsy IDC, LV I present, grade 2-3, ER 0%, PR 0%, Ki-67 40%, HER-2 +3+, left axillary lymph node positive (3 nodes noted by [place]) T3 N1 stage IIIA   Treatment plan 1.  Neoadjuvant chemotherapy with [facility] Perjeta completed 06/17/2018 followed by Herceptin Perjeta maintenance completed December 2020 2.  lumpectomy with axillary lymph node dissection: 07/12/2018 (Dr. Marlou Starks): complete treatment response, no residual carcinoma present in breast or 11/11 lymph nodes.  3.  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 Discussed the pros and cons of neratinib and decided not to pursue it.   Breast cancer surveillance:  Mammogram 01/09/2020: Benign Breast exam 12/06/2020: Benign   We discussed that there is a possibility that we may have a blood test for breast cancer early detection. Return to clinic in  1 year for follow-up.    No orders of the defined types were placed in this encounter.  The patient has a good understanding of the overall plan. she agrees with it. she will call with any problems  that may develop before the next visit here.  Total time spent: 20 mins including face to face time and time spent for planning, charting and coordination of care  Rulon Eisenmenger, MD, MPH 12/06/2020  I, Thana Ates, am acting as scribe for Dr. Nicholas Lose.  I have reviewed the above documentation for accuracy and completeness, and I agree with the above.

## 2020-12-06 ENCOUNTER — Inpatient Hospital Stay: Payer: Managed Care, Other (non HMO) | Attending: Hematology and Oncology | Admitting: Hematology and Oncology

## 2020-12-06 ENCOUNTER — Other Ambulatory Visit: Payer: Self-pay

## 2020-12-06 DIAGNOSIS — Z171 Estrogen receptor negative status [ER-]: Secondary | ICD-10-CM | POA: Diagnosis not present

## 2020-12-06 DIAGNOSIS — Z923 Personal history of irradiation: Secondary | ICD-10-CM | POA: Diagnosis not present

## 2020-12-06 DIAGNOSIS — Z7951 Long term (current) use of inhaled steroids: Secondary | ICD-10-CM | POA: Insufficient documentation

## 2020-12-06 DIAGNOSIS — T451X5S Adverse effect of antineoplastic and immunosuppressive drugs, sequela: Secondary | ICD-10-CM | POA: Diagnosis not present

## 2020-12-06 DIAGNOSIS — Z853 Personal history of malignant neoplasm of breast: Secondary | ICD-10-CM | POA: Diagnosis present

## 2020-12-06 DIAGNOSIS — Z79899 Other long term (current) drug therapy: Secondary | ICD-10-CM | POA: Insufficient documentation

## 2020-12-06 DIAGNOSIS — G620 Drug-induced polyneuropathy: Secondary | ICD-10-CM | POA: Diagnosis not present

## 2020-12-06 DIAGNOSIS — Z9221 Personal history of antineoplastic chemotherapy: Secondary | ICD-10-CM | POA: Insufficient documentation

## 2020-12-06 DIAGNOSIS — C50412 Malignant neoplasm of upper-outer quadrant of left female breast: Secondary | ICD-10-CM | POA: Diagnosis not present

## 2020-12-06 NOTE — Assessment & Plan Note (Signed)
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 Marlou Starks): complete treatment response, no residual carcinoma present in breast or 11/11 lymph nodes. 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 Discussed the pros and cons of neratinib and decided not to pursue it.  Breast cancer surveillance:  Mammogram 01/09/2020: Benign Breast exam 12/06/2020: Benign  Return to clinic in 1 yearfor follow-up.

## 2020-12-18 ENCOUNTER — Other Ambulatory Visit: Payer: Self-pay | Admitting: Hematology and Oncology

## 2020-12-18 DIAGNOSIS — Z9889 Other specified postprocedural states: Secondary | ICD-10-CM

## 2020-12-26 ENCOUNTER — Other Ambulatory Visit (HOSPITAL_BASED_OUTPATIENT_CLINIC_OR_DEPARTMENT_OTHER): Payer: Self-pay

## 2020-12-26 MED ORDER — METRONIDAZOLE 500 MG PO TABS
ORAL_TABLET | ORAL | 1 refills | Status: DC
  Filled 2020-12-26: qty 14, 7d supply, fill #0
  Filled 2021-01-06: qty 14, 7d supply, fill #1

## 2021-01-03 ENCOUNTER — Ambulatory Visit (INDEPENDENT_AMBULATORY_CARE_PROVIDER_SITE_OTHER): Payer: Managed Care, Other (non HMO) | Admitting: Bariatrics

## 2021-01-03 ENCOUNTER — Other Ambulatory Visit: Payer: Self-pay

## 2021-01-03 ENCOUNTER — Encounter (INDEPENDENT_AMBULATORY_CARE_PROVIDER_SITE_OTHER): Payer: Self-pay | Admitting: Bariatrics

## 2021-01-03 VITALS — BP 126/64 | HR 71 | Temp 98.1°F | Ht 65.0 in | Wt 189.0 lb

## 2021-01-03 DIAGNOSIS — E559 Vitamin D deficiency, unspecified: Secondary | ICD-10-CM

## 2021-01-03 DIAGNOSIS — R7303 Prediabetes: Secondary | ICD-10-CM | POA: Diagnosis not present

## 2021-01-03 DIAGNOSIS — E669 Obesity, unspecified: Secondary | ICD-10-CM | POA: Diagnosis not present

## 2021-01-03 DIAGNOSIS — Z6832 Body mass index (BMI) 32.0-32.9, adult: Secondary | ICD-10-CM | POA: Diagnosis not present

## 2021-01-07 ENCOUNTER — Other Ambulatory Visit (HOSPITAL_BASED_OUTPATIENT_CLINIC_OR_DEPARTMENT_OTHER): Payer: Self-pay

## 2021-01-07 NOTE — Progress Notes (Signed)
Chief Complaint:   OBESITY Christy Serrano is here to discuss her progress with her obesity treatment plan along with follow-up of her obesity related diagnoses. Ar[place]us is on practicing portion control and making smarter food choices, such as increasing vegetables and decreasing simple carbohydrates and states she is following her eating plan approximately 100% of the time. Guenevere states she is doing 0 minutes 0 times per week.  Today's visit was #: 26 Starting weight: 195 lbs Starting date: 12/22/2017 Today's weight: 189 lbs Today's date: 01/03/2021 Total lbs lost to date: 6 lbs Total lbs lost since last in-office visit: 0  Interim History: Christy Serrano is up 3 lbs since her last appointment on 08/06/2020. Her appetite is normal. She is getting adequate protein.   Subjective:   1. Prediabetes Christy Serrano is not on medications currently.  2. Vitamin D deficiency She is currently taking OTC vitamin D each day. She denies nausea, vomiting or muscle weakness.  Assessment/Plan:   1. Prediabetes Christy Serrano will continue to work on weight loss, exercise, and decreasing simple carbohydrates to help decrease the risk of diabetes. She will increase healthy fats and protein.  2. Vitamin D deficiency Low Vitamin D level contributes to fatigue and are associated with obesity, breast, and colon cancer. Christy Serrano agrees to continue to take OTC Vitamin D daily and she will follow-up for routine testing of Vitamin D, at least 2-3 times per year to avoid over-replacement.  3. Obesity with current BMI of 31.5 Nathania is currently in the action stage of change. As such, her goal is to continue with weight loss efforts. She has agreed to the Category 2 Plan, keeping a food journal and adhering to recommended goals of 1200 calories and 80 grams of protein, and practicing portion control and making smarter food choices, such as increasing vegetables and decreasing simple carbohydrates.   Christy Serrano will continue  meal planning. She will adhere closely to her plan.  Exercise goals:  Christy Serrano is not doing much exercise due to left hip pain.  Behavioral modification strategies: increasing lean protein intake, decreasing simple carbohydrates, increasing vegetables, increasing water intake, decreasing eating out, no skipping meals, meal planning and cooking strategies, keeping healthy foods in the home, and planning for success.  Christy Serrano has agreed to follow-up with our clinic in 5 months. She was informed of the importance of frequent follow-up visits to maximize her success with intensive lifestyle modifications for her multiple health conditions.   Objective:   Blood pressure 126/64, pulse 71, temperature 98.1 F (36.7 C), height 5\' 5"  (1.651 m), weight 189 lb (85.7 kg), SpO2 99 %. Body mass index is 31.45 kg/m.  General: Cooperative, alert, well developed, in no acute distress. HEENT: Conjunctivae and lids unremarkable. Cardiovascular: Regular rhythm.  Lungs: Normal work of breathing. Neurologic: No focal deficits.   Lab Results  Component Value Date   CREATININE 0.99 08/06/2020   BUN 13 08/06/2020   NA 143 08/06/2020   K 4.1 08/06/2020   CL 104 08/06/2020   CO2 24 08/06/2020   Lab Results  Component Value Date   ALT 19 08/06/2020   AST 19 08/06/2020   ALKPHOS 87 08/06/2020   BILITOT 0.6 08/06/2020   Lab Results  Component Value Date   HGBA1C 6.1 (H) 08/06/2020   HGBA1C 5.9 (H) 09/29/2019   HGBA1C 5.7 (H) 10/20/2018   HGBA1C 5.6 05/31/2018   HGBA1C 6.1 (H) 12/22/2017   Lab Results  Component Value Date   INSULIN 10.2 08/06/2020   INSULIN 9.2  09/29/2019   INSULIN 4.8 05/31/2018   INSULIN 13.7 12/22/2017   Lab Results  Component Value Date   TSH 3.644 03/10/2018   Lab Results  Component Value Date   CHOL 187 08/06/2020   HDL 79 08/06/2020   LDLCALC 93 08/06/2020   TRIG 83 08/06/2020   Lab Results  Component Value Date   VD25OH 48.2 08/06/2020   VD25OH 48.7  09/29/2019   VD25OH 46.6 10/20/2018   Lab Results  Component Value Date   WBC 3.9 (L) 05/24/2019   HGB 11.8 (L) 05/24/2019   HCT 37.4 05/24/2019   MCV 96.1 05/24/2019   PLT 193 05/24/2019   No results found for: IRON, TIBC, FERRITIN  Attestation Statements:   Reviewed by clinician on day of visit: allergies, medications, problem list, medical history, surgical history, family history, social history, and previous encounter notes.  I, Lizbeth Bark, RMA, am acting as Location manager for [employer], DO.   I have reviewed the above documentation for accuracy and completeness, and I agree with the above. Jearld Lesch, DO

## 2021-01-08 ENCOUNTER — Encounter (INDEPENDENT_AMBULATORY_CARE_PROVIDER_SITE_OTHER): Payer: Self-pay | Admitting: Bariatrics

## 2021-01-09 ENCOUNTER — Ambulatory Visit
Admission: RE | Admit: 2021-01-09 | Discharge: 2021-01-09 | Disposition: A | Discharge status: Home / Self Care | Payer: Managed Care, Other (non HMO) | Source: Ambulatory Visit | Attending: Hematology and Oncology | Admitting: Hematology and Oncology

## 2021-01-09 ENCOUNTER — Other Ambulatory Visit: Payer: Self-pay

## 2021-01-09 DIAGNOSIS — Z9889 Other specified postprocedural states: Secondary | ICD-10-CM

## 2021-01-10 ENCOUNTER — Other Ambulatory Visit (HOSPITAL_BASED_OUTPATIENT_CLINIC_OR_DEPARTMENT_OTHER): Payer: Self-pay

## 2021-01-24 ENCOUNTER — Encounter: Payer: Self-pay | Admitting: Gastroenterology

## 2021-02-01 ENCOUNTER — Ambulatory Visit: Payer: Managed Care, Other (non HMO) | Attending: Internal Medicine

## 2021-02-01 DIAGNOSIS — Z23 Encounter for immunization: Secondary | ICD-10-CM

## 2021-02-01 NOTE — Progress Notes (Signed)
   Covid-19 Vaccination Clinic  Name:  Christy Serrano    MRN: [id number] DOB: 06-30-1955  02/01/2021  [name] was observed post Covid-19 immunization for 15 minutes without incident. She was provided with Vaccine Information Sheet and instruction to access the V-Safe system.   [name] was instructed to call 911 with any severe reactions post vaccine: Difficulty breathing  Swelling of face and throat  A fast heartbeat  A bad rash all over body  Dizziness and weakness   Immunizations Administered     Name Date Dose VIS Date Route   Pfizer Covid-19 Vaccine Bivalent Booster 02/01/2021  9:56 AM 0.3 mL 11/21/2020 Intramuscular   Manufacturer: [place]   Lot: [id number]   [place]: [phone]

## 2021-02-06 ENCOUNTER — Other Ambulatory Visit: Payer: Self-pay

## 2021-02-06 ENCOUNTER — Encounter: Payer: Self-pay | Admitting: Gastroenterology

## 2021-02-06 ENCOUNTER — Other Ambulatory Visit (HOSPITAL_BASED_OUTPATIENT_CLINIC_OR_DEPARTMENT_OTHER): Payer: Self-pay

## 2021-02-06 ENCOUNTER — Ambulatory Visit (AMBULATORY_SURGERY_CENTER): Payer: Managed Care, Other (non HMO) | Admitting: *Deleted

## 2021-02-06 VITALS — Ht 65.0 in | Wt 185.0 lb

## 2021-02-06 DIAGNOSIS — Z1211 Encounter for screening for malignant neoplasm of colon: Secondary | ICD-10-CM

## 2021-02-06 MED ORDER — NA SULFATE-K SULFATE-MG SULF 17.5-3.13-1.6 GM/177ML PO SOLN
1.0000 | Freq: Once | ORAL | 0 refills | Status: AC
  Filled 2021-02-06 – 2021-02-25 (×2): qty 354, 1d supply, fill #0

## 2021-02-06 NOTE — Progress Notes (Signed)
PV completed over the phone. Pt verified name, DOB, address and insurance during PV today.  °Pt mailed instruction packet with copy of consent form to read and not return, and instructions.  ° °Pt encouraged to call with questions or issues.  °If pt has My chart, procedure instructions sent via My Chart  ° °No egg or soy allergy known to patient  °No issues known to pt with past sedation with any surgeries or procedures °Patient denies ever being told they had issues or difficulty with intubation  °No FH of Malignant Hyperthermia °Pt is not on diet pills °Pt is not on  home 02  °Pt is not on blood thinners  °Pt denies issues with constipation  °No A fib or A flutter ° °Pt is fully vaccinated  for Covid  ° °NO PA's for preps discussed with pt In PV today  °Discussed with pt there will be an out-of-pocket cost for prep and that varies from $0 to 70 +  dollars - pt verbalized understanding  ° °Due to the COVID-19 pandemic we are asking patients to follow certain guidelines in PV and the [facility]   °Pt aware of COVID protocols and [facility] guidelines  ° °

## 2021-02-15 ENCOUNTER — Other Ambulatory Visit (HOSPITAL_BASED_OUTPATIENT_CLINIC_OR_DEPARTMENT_OTHER): Payer: Self-pay

## 2021-02-19 ENCOUNTER — Other Ambulatory Visit (HOSPITAL_BASED_OUTPATIENT_CLINIC_OR_DEPARTMENT_OTHER): Payer: Self-pay

## 2021-02-19 MED ORDER — PFIZER COVID-19 VAC BIVALENT 30 MCG/0.3ML IM SUSP
INTRAMUSCULAR | 0 refills | Status: DC
  Filled 2021-02-19: qty 0.3, 1d supply, fill #0

## 2021-02-22 ENCOUNTER — Encounter: Payer: Self-pay | Admitting: Hematology and Oncology

## 2021-02-25 ENCOUNTER — Other Ambulatory Visit (HOSPITAL_BASED_OUTPATIENT_CLINIC_OR_DEPARTMENT_OTHER): Payer: Self-pay

## 2021-03-01 ENCOUNTER — Ambulatory Visit (AMBULATORY_SURGERY_CENTER): Payer: Managed Care, Other (non HMO) | Admitting: Gastroenterology

## 2021-03-01 ENCOUNTER — Encounter: Payer: Self-pay | Admitting: Gastroenterology

## 2021-03-01 VITALS — BP 146/57 | HR 58 | Temp 97.1°F | Resp 14 | Ht 65.0 in | Wt 185.0 lb

## 2021-03-01 DIAGNOSIS — Z1211 Encounter for screening for malignant neoplasm of colon: Secondary | ICD-10-CM

## 2021-03-01 DIAGNOSIS — D123 Benign neoplasm of transverse colon: Secondary | ICD-10-CM | POA: Diagnosis not present

## 2021-03-01 MED ORDER — SODIUM CHLORIDE 0.9 % IV SOLN: 500.0000 mL | Freq: Once | INTRAVENOUS | Status: DC

## 2021-03-01 NOTE — Progress Notes (Signed)
Called to room to assist during endoscopic procedure.  Patient ID and intended procedure confirmed with present staff. Received instructions for my participation in the procedure from the performing physician.  

## 2021-03-01 NOTE — Progress Notes (Signed)
VSS, transported to PACU °

## 2021-03-01 NOTE — Op Note (Signed)
[place] Patient Name: Essence Merle Procedure Date: 03/01/2021 3:22 PM MRN: [id number] Endoscopist: Remo Lipps P. Havery Moros , MD Age: 65 Referring MD:  Date of Birth: Apr 21, 1955 Gender: Female Account #: [id number] Procedure:                Colonoscopy Indications:              Screening for colorectal malignant neoplasm Medicines:                Monitored Anesthesia Care Procedure:                Pre-Anesthesia Assessment:                           - Prior to the procedure, a History and Physical                            was performed, and patient medications and                            allergies were reviewed. The patient's tolerance of                            previous anesthesia was also reviewed. The risks                            and benefits of the procedure and the sedation                            options and risks were discussed with the patient.                            All questions were answered, and informed consent                            was obtained. Prior Anticoagulants: The patient has                            taken no previous anticoagulant or antiplatelet                            agents. ASA Grade Assessment: II - A patient with                            mild systemic disease. After reviewing the risks                            and benefits, the patient was deemed in                            satisfactory condition to undergo the procedure.                           After obtaining informed consent, the colonoscope  was passed under direct vision. Throughout the                            procedure, the patient's blood pressure, pulse, and                            oxygen saturations were monitored continuously. The                            PCF-HQ190L Colonoscope was introduced through the                            anus and advanced to the the cecum, identified by                             appendiceal orifice and ileocecal valve. The                            colonoscopy was performed without difficulty. The                            patient tolerated the procedure well. The quality                            of the bowel preparation was good. The ileocecal                            valve, appendiceal orifice, and rectum were                            photographed. Scope In: 3:26:39 PM Scope Out: 3:48:46 PM Scope Withdrawal Time: 0 hours 17 minutes 22 seconds  Total Procedure Duration: 0 hours 22 minutes 7 seconds  Findings:                 The perianal and digital rectal examinations were                            normal.                           The colon was tortuous.                           A 3 mm polyp was found in the hepatic flexure. The                            polyp was sessile. The polyp was removed with a                            cold snare. Resection and retrieval were complete.                           Internal hemorrhoids were found during retroflexion.  The exam was otherwise without abnormality. Complications:            No immediate complications. Estimated blood loss:                            Minimal. Estimated Blood Loss:     Estimated blood loss was minimal. Impression:               - Tortuous colon.                           - One 3 mm polyp at the hepatic flexure, removed                            with a cold snare. Resected and retrieved.                           - Internal hemorrhoids.                           - The examination was otherwise normal. Recommendation:           - Patient has a contact number available for                            emergencies. The signs and symptoms of potential                            delayed complications were discussed with the                            patient. Return to normal activities tomorrow.                            Written discharge instructions were  provided to the                            patient.                           - Resume previous diet.                           - Continue present medications.                           - Await pathology results. Remo Lipps P. Vernell Townley, MD 03/01/2021 3:51:46 PM This report has been signed electronically.

## 2021-03-01 NOTE — Patient Instructions (Addendum)
Impression/Recommendations:  Polyp and hemorrhoid handouts given to patient.  Resume previous diet. Continue present medications. Await pathology results.  YOU HAD AN ENDOSCOPIC PROCEDURE TODAY AT [facility]:   Refer to the procedure report that was given to you for any specific questions about what was found during the examination.  If the procedure report does not answer your questions, please call your gastroenterologist to clarify.  If you requested that your care partner not be given the details of your procedure findings, then the procedure report has been included in a sealed envelope for you to review at your convenience later.  YOU SHOULD EXPECT: Some feelings of bloating in the abdomen. Passage of more gas than usual.  Walking can help get rid of the air that was put into your GI tract during the procedure and reduce the bloating. If you had a lower endoscopy (such as a colonoscopy or flexible sigmoidoscopy) you may notice spotting of blood in your stool or on the toilet paper. If you underwent a bowel prep for your procedure, you may not have a normal bowel movement for a few days.  Please Note:  You might notice some irritation and congestion in your nose or some drainage.  This is from the oxygen used during your procedure.  There is no need for concern and it should clear up in a day or so.   SYMPTOMS TO REPORT IMMEDIATELY:  Following lower endoscopy (colonoscopy or flexible sigmoidoscopy):  Excessive amounts of blood in the stool  Significant tenderness or worsening of abdominal pains  Swelling of the abdomen that is new, acute  Fever of 100F or higher For urgent or emergent issues, a gastroenterologist can be reached at any hour by calling [phone]. Do not use MyChart messaging for urgent concerns.    DIET:  We do recommend a small meal at first, but then you may proceed to your regular diet.  Drink plenty of fluids but you should avoid alcoholic  beverages for 24 hours.  ACTIVITY:  You should plan to take it easy for the rest of today and you should NOT DRIVE or use heavy machinery until tomorrow (because of the sedation medicines used during the test).    FOLLOW UP: Our staff will call the number listed on your records 48-72 hours following your procedure to check on you and address any questions or concerns that you may have regarding the information given to you following your procedure. If we do not reach you, we will leave a message.  We will attempt to reach you two times.  During this call, we will ask if you have developed any symptoms of COVID 19. If you develop any symptoms (ie: fever, flu-like symptoms, shortness of breath, cough etc.) before then, please call [phone].  If you test positive for Covid 19 in the 2 weeks post procedure, please call and report this information to [place].    If any biopsies were taken you will be contacted by phone or by letter within the next 1-3 weeks.  Please call us at [phone] if you have not heard about the biopsies in 3 weeks.    SIGNATURES/CONFIDENTIALITY: You and/or your care partner have signed paperwork which will be entered into your electronic medical record.  These signatures attest to the fact that that the information above on your After Visit Summary has been reviewed and is understood.  Full responsibility of the confidentiality of this discharge information lies with you and/or your care-partner.

## 2021-03-01 NOTE — Progress Notes (Signed)
[facility] History and Physical   Primary Care Physician:  Velna Hatchet, MD   Reason for Procedure:   Colon cancer screening  Plan:    colonoscopy     HPI: Christy Serrano is a 65 y.o. female  here for colonoscopy screening - first time exam. Patient denies any bowel symptoms at this time. No family history of colon cancer known. Otherwise feels well without any cardiopulmonary symptoms. Last exam 13 years ago   Past Medical History:  Diagnosis Date   Arthritis    left hip   Breast cancer ([place]) 2020   Left Breast Cancer   Cancer ([place])    left breast   Chronic bronchitis ([place])    Family history of breast cancer    Family history of ovarian cancer    Family history of prostate cancer    History of radiation therapy 09/02/18- 10/14/18   Left Breast 25 fx for a total of 50 Gy, Left SCV, PA 25 fractions for a total of 50 Gy, Left Breast boost 5 fractions, for a total of 10 Gy.    Lactose intolerance    Personal history of chemotherapy 2020   Left Breast Cancer   Personal history of radiation therapy 2020   Left Breast Cancer   PONV (postoperative nausea and vomiting)     Past Surgical History:  Procedure Laterality Date   BREAST LUMPECTOMY Left 07/12/2018   BREAST LUMPECTOMY WITH RADIOACTIVE SEED AND AXILLARY LYMPH NODE DISSECTION Left 07/12/2018   Procedure: LEFT BREAST RADIOACTIVE SEED X 3 LUMPECTOMY X 3  AND AXILLARY LYMPH NODE DISSECTION;  Surgeon: Jovita Kussmaul, MD;  Location: [facility];  Service: General;  Laterality: Left;   COLONOSCOPY  2009   PORT-A-CATH REMOVAL Right 04/01/2019   Procedure: REMOVAL PORT-A-CATH;  Surgeon: Jovita Kussmaul, MD;  Location: [facility];  Service: General;  Laterality: Right;   PORTACATH PLACEMENT Right 03/03/2018   Procedure: INSERTION PORT-A-CATH;  Surgeon: Jovita Kussmaul, MD;  Location: [facility];  Service: General;  Laterality: Right;   ROTATOR CUFF REPAIR Right 2006    TOTAL ABDOMINAL HYSTERECTOMY  2010    Prior to Admission medications   Medication Sig Start Date End Date Taking? Authorizing Provider  Calcium-Phosphorus-Vitamin D (CITRACAL +D3 PO) Take 1,200 mcg by mouth.   Yes [provider]  cholecalciferol (VITAMIN D) 1000 units tablet Take 1,000 Units by mouth daily.   Yes [provider]  estradiol (DOTTI) 0.025 MG/24HR APPLY 1 PATCH TOPICALLY TO  SKIN TWICE WEEKLY 03/28/20  Yes Nicholas Lose, MD  Multiple Vitamins-Minerals (COMPLETE SENIOR PO) Take 1 tablet by mouth daily. 08/29/15  Yes [provider]  acetaminophen (TYLENOL) 500 MG tablet Take 1,000 mg by mouth every 6 (six) hours as needed for mild pain.    [provider]  albuterol (PROVENTIL HFA;VENTOLIN HFA) 108 (90 Base) MCG/ACT inhaler Inhale into the lungs every 6 (six) hours as needed for wheezing or shortness of breath.    [provider]  budesonide-formoterol (SYMBICORT) 160-4.5 MCG/ACT inhaler Inhale 2 puffs into the lungs 2 (two) times daily.    [provider]  COVID-19 mRNA bivalent vaccine, Pfizer, (PFIZER COVID-19 VAC BIVALENT) injection Inject into the muscle. 02/01/21   Carlyle Basques, MD  COVID-19 mRNA Vac-TriS, Pfizer, (PFIZER-BIONT COVID-19 VAC-TRIS) SUSP injection Inject into the muscle. 07/05/20   Carlyle Basques, MD  cyclobenzaprine (FLEXERIL) 10 MG tablet TAKE 1 TABLET (10 MG TOTAL) BY MOUTH NIGHTLY AS NEEDED  FOR UP TO 30 DAYS FOR MUSCLE SPASMS. 05/21/20 05/21/21  Drue Novel, MD  meloxicam (MOBIC) 15 MG tablet Take 1 tablet (15 mg total) by mouth daily for 30 days. 11/19/20       Current Outpatient Medications  Medication Sig Dispense Refill   Calcium-Phosphorus-Vitamin D (CITRACAL +D3 PO) Take 1,200 mcg by mouth.     cholecalciferol (VITAMIN D) 1000 units tablet Take 1,000 Units by mouth daily.     estradiol (DOTTI) 0.025 MG/24HR APPLY 1 PATCH TOPICALLY TO  SKIN TWICE WEEKLY 24 patch 3   Multiple Vitamins-Minerals  (COMPLETE SENIOR PO) Take 1 tablet by mouth daily.     acetaminophen (TYLENOL) 500 MG tablet Take 1,000 mg by mouth every 6 (six) hours as needed for mild pain.     albuterol (PROVENTIL HFA;VENTOLIN HFA) 108 (90 Base) MCG/ACT inhaler Inhale into the lungs every 6 (six) hours as needed for wheezing or shortness of breath.     budesonide-formoterol (SYMBICORT) 160-4.5 MCG/ACT inhaler Inhale 2 puffs into the lungs 2 (two) times daily.     COVID-19 mRNA bivalent vaccine, Pfizer, (PFIZER COVID-19 VAC BIVALENT) injection Inject into the muscle. 0.3 mL 0   COVID-19 mRNA Vac-TriS, Pfizer, (PFIZER-BIONT COVID-19 VAC-TRIS) SUSP injection Inject into the muscle. 0.3 mL 0   cyclobenzaprine (FLEXERIL) 10 MG tablet TAKE 1 TABLET (10 MG TOTAL) BY MOUTH NIGHTLY AS NEEDED FOR UP TO 30 DAYS FOR MUSCLE SPASMS. 30 tablet 0   meloxicam (MOBIC) 15 MG tablet Take 1 tablet (15 mg total) by mouth daily for 30 days. 30 tablet 2   Current Facility-Administered Medications  Medication Dose Route Frequency Provider Last Rate Last Admin   0.9 %  sodium chloride infusion  500 mL Intravenous Once Amylee Lodato, Carlota Raspberry, MD        Allergies as of 03/01/2021 - Review Complete 03/01/2021  Allergen Reaction Noted   Gabapentin Swelling 08/11/2018   Amoxicillin Rash 12/07/2013   Oxycodone Palpitations 04/01/2019   Penicillin g Rash 12/26/2020    Family History  Problem Relation Age of Onset   Breast cancer Mother 17   High blood pressure Mother    Heart attack Mother        CHF   High blood pressure Father    Prostate cancer Father        dx mid 46s   Breast cancer Sister 64       negative Invitae 46 gene panel   Breast cancer Sister 84       "negative 21 gene panel"   Breast cancer Paternal Aunt        dx mid 74s   Cancer Paternal Uncle        jaw cancer d. 62s   Ovarian cancer Maternal Grandmother        d. 86s   Cancer Paternal Grandmother        either stomach or ovarian cancer   Colon cancer Neg Hx    Colon  polyps Neg Hx    Esophageal cancer Neg Hx    Rectal cancer Neg Hx    Stomach cancer Neg Hx     Social History   Socioeconomic History   Marital status: Single    Spouse name: Not on file   Number of children: 0   Years of education: 13   Highest education level: Not on file  Occupational History   Occupation: lab tech    Comment: [employer]  Tobacco Use   Smoking status: Never  Smokeless tobacco: Never  Vaping Use   Vaping Use: Never used  Substance and Sexual Activity   Alcohol use: No    Comment: quit long ago   Drug use: No   Sexual activity: Not on file  Other Topics Concern   Not on file  Social History Narrative   Lives with sister   Caffeine- rarely   Social Determinants of Health   Financial Resource Strain: Not on file  Food Insecurity: Not on file  Transportation Needs: Not on file  Physical Activity: Not on file  Stress: Not on file  Social Connections: Not on file  Intimate Partner Violence: Not on file    Review of Systems: All other review of systems negative except as mentioned in the HPI.  Physical Exam: Vital signs BP (!) 153/95   Pulse 60   Temp (!) 97.1 F (36.2 C)   Ht 5\' 5"  (1.651 m)   Wt 185 lb (83.9 kg)   SpO2 100%   BMI 30.79 kg/m   General:   Alert,  Well-developed, pleasant and cooperative in NAD Lungs:  Clear throughout to auscultation.   Heart:  Regular rate and rhythm Abdomen:  Soft, nontender and nondistended.   Neuro/Psych:  Alert and cooperative. Normal mood and affect. A and O x 3  Jolly Mango, MD [facility]

## 2021-03-01 NOTE — Progress Notes (Signed)
VS by DT  Pt's states no medical or surgical changes since previsit or office visit.  

## 2021-03-05 ENCOUNTER — Telehealth: Payer: Self-pay | Admitting: *Deleted

## 2021-03-05 NOTE — Telephone Encounter (Signed)
  Follow up Call-  Call back number 03/01/2021  Post procedure Call Back phone  # [phone]  Permission to leave phone message Yes  Some recent data might be hidden     Patient questions:  Do you have a fever, pain , or abdominal swelling? No. Pain Score  0 *  Have you tolerated food without any problems? Yes.    Have you been able to return to your normal activities? Yes.    Do you have any questions about your discharge instructions: Diet   No. Medications  No. Follow up visit  No.  Do you have questions or concerns about your Care? No.  Actions: * If pain score is 4 or above: No action needed, pain <4.

## 2021-03-11 ENCOUNTER — Encounter: Payer: Self-pay | Admitting: Hematology and Oncology

## 2021-03-11 ENCOUNTER — Other Ambulatory Visit: Payer: Self-pay | Admitting: *Deleted

## 2021-03-11 MED ORDER — ESTRADIOL 0.025 MG/24HR TD PTTW: MEDICATED_PATCH | TRANSDERMAL | 3 refills | Status: DC

## 2021-05-27 ENCOUNTER — Other Ambulatory Visit: Payer: Self-pay

## 2021-05-27 ENCOUNTER — Ambulatory Visit (INDEPENDENT_AMBULATORY_CARE_PROVIDER_SITE_OTHER): Payer: Managed Care, Other (non HMO) | Admitting: Bariatrics

## 2021-05-27 ENCOUNTER — Encounter (INDEPENDENT_AMBULATORY_CARE_PROVIDER_SITE_OTHER): Payer: Self-pay | Admitting: Bariatrics

## 2021-05-27 VITALS — BP 146/76 | HR 66 | Temp 98.0°F | Ht 65.0 in | Wt 190.0 lb

## 2021-05-27 DIAGNOSIS — R7303 Prediabetes: Secondary | ICD-10-CM

## 2021-05-27 DIAGNOSIS — E559 Vitamin D deficiency, unspecified: Secondary | ICD-10-CM

## 2021-05-27 DIAGNOSIS — Z6831 Body mass index (BMI) 31.0-31.9, adult: Secondary | ICD-10-CM

## 2021-05-27 DIAGNOSIS — E669 Obesity, unspecified: Secondary | ICD-10-CM

## 2021-05-27 NOTE — Progress Notes (Signed)
? ? ? ?Chief Complaint:  ? ?OBESITY ?Christy Serrano is here to discuss her progress with her obesity treatment plan along with follow-up of her obesity related diagnoses. Dhruvi is on the Category 2 Plan and practicing portion control and making smarter food choices, such as increasing vegetables and decreasing simple carbohydrates and states she is following her eating plan approximately 0% of the time. Ashlin states she is doing 0 minutes 0 times per week. ? ?Today's visit was #: 1 ?Starting weight: 195 lbs ?Starting date: 12/22/2017 ?Today's weight: 190 lbs ?Today's date: 05/27/2021 ?Total lbs lost to date: 5 lbs ?Total lbs lost since last in-office visit: 0 ? ?Interim History: [name] is up 1 lb since her last visit. She has been able to maintain her weight well. She has minimized fried food, sweets and carbohydrates. ? ?Subjective:  ? ?1. Pre-diabetes ?[name] is not on medications.  ? ?2. Vitamin D deficiency ?[name] is taking Vitamin D currently.  ? ?Assessment/Plan:  ? ?1. Pre-diabetes ?[name] will minimize sweets and carbohydrates. She will continue to work on weight loss, exercise, and decreasing simple carbohydrates to help decrease the risk of diabetes.  ? ?2. Vitamin D deficiency ?Low Vitamin D level contributes to fatigue and are associated with obesity, breast, and colon cancer. [name] agrees to continue to take prescription Vitamin D and she will follow-up for routine testing of Vitamin D, at least 2-3 times per year to avoid over-replacement. ? ?3. Obesity with current BMI of 31.7 ?[name] is currently in the action stage of change. As such, her goal is to continue with weight loss efforts. She has agreed to the Category 2 Plan and practicing portion control and making smarter food choices, such as increasing vegetables and decreasing simple carbohydrates.  ? ?[name] will continue meal planning. She will keep her protein and water high. She will keep healthy snacks at home.  ? ?Exercise  goals:  [name] will increase exercise walking, chair exercise and walking at lunch.  ? ?Behavioral modification strategies: increasing lean protein intake, decreasing simple carbohydrates, increasing vegetables, increasing water intake, decreasing eating out, no skipping meals, meal planning and cooking strategies, keeping healthy foods in the home, and planning for success. ? ?[name] has agreed to follow-up with our clinic in 5 weeks. She was informed of the importance of frequent follow-up visits to maximize her success with intensive lifestyle modifications for her multiple health conditions.  ? ?Objective:  ? ?Blood pressure (!) 146/76, pulse 66, temperature 98 ?F (36.7 ?C), height '5\' 5"'$  (1.651 m), weight 190 lb (86.2 kg), SpO2 100 %. ?Body mass index is 31.62 kg/m?. ? ?General: Cooperative, alert, well developed, in no acute distress. ?HEENT: Conjunctivae and lids unremarkable. ?Cardiovascular: Regular rhythm.  ?Lungs: Normal work of breathing. ?Neurologic: No focal deficits.  ? ?Lab Results  ?Component Value Date  ? CREATININE 0.99 08/06/2020  ? BUN 13 08/06/2020  ? NA 143 08/06/2020  ? K 4.1 08/06/2020  ? CL 104 08/06/2020  ? CO2 24 08/06/2020  ? ?Lab Results  ?Component Value Date  ? ALT 19 08/06/2020  ? AST 19 08/06/2020  ? ALKPHOS 87 08/06/2020  ? BILITOT 0.6 08/06/2020  ? ?Lab Results  ?Component Value Date  ? HGBA1C 6.1 (H) 08/06/2020  ? HGBA1C 5.9 (H) 09/29/2019  ? HGBA1C 5.7 (H) 10/20/2018  ? HGBA1C 5.6 05/31/2018  ? HGBA1C 6.1 (H) 12/22/2017  ? ?Lab Results  ?Component Value Date  ? INSULIN 10.2 08/06/2020  ? INSULIN 9.2 09/29/2019  ? INSULIN 4.8 05/31/2018  ?  INSULIN 13.7 12/22/2017  ? ?Lab Results  ?Component Value Date  ? TSH 3.644 03/10/2018  ? ?Lab Results  ?Component Value Date  ? CHOL 187 08/06/2020  ? HDL 79 08/06/2020  ? [place] 93 08/06/2020  ? TRIG 83 08/06/2020  ? ?Lab Results  ?Component Value Date  ? VD25OH 48.2 08/06/2020  ? VD25OH 48.7 09/29/2019  ? VD25OH 46.6 10/20/2018  ? ?Lab  Results  ?Component Value Date  ? WBC 3.9 (L) 05/24/2019  ? HGB 11.8 (L) 05/24/2019  ? HCT 37.4 05/24/2019  ? MCV 96.1 05/24/2019  ? PLT 193 05/24/2019  ? ?No results found for: IRON, TIBC, FERRITIN ? ?Attestation Statements:  ? ?Reviewed by clinician on day of visit: allergies, medications, problem list, medical history, surgical history, family history, social history, and previous encounter notes. ? ?I, Lizbeth Bark, RMA, am acting as transcriptionist for [employer], DO. ? ?I have reviewed the above documentation for accuracy and completeness, and I agree with the above. Jearld Lesch, DO ? ?

## 2021-05-30 ENCOUNTER — Encounter (INDEPENDENT_AMBULATORY_CARE_PROVIDER_SITE_OTHER): Payer: Self-pay | Admitting: Bariatrics

## 2021-06-04 ENCOUNTER — Ambulatory Visit (INDEPENDENT_AMBULATORY_CARE_PROVIDER_SITE_OTHER): Payer: Managed Care, Other (non HMO) | Admitting: Bariatrics

## 2021-09-21 ENCOUNTER — Other Ambulatory Visit: Payer: Self-pay | Admitting: Nurse Practitioner

## 2021-10-28 ENCOUNTER — Ambulatory Visit (INDEPENDENT_AMBULATORY_CARE_PROVIDER_SITE_OTHER): Payer: Managed Care, Other (non HMO) | Admitting: Bariatrics

## 2021-10-28 ENCOUNTER — Encounter (INDEPENDENT_AMBULATORY_CARE_PROVIDER_SITE_OTHER): Payer: Self-pay | Admitting: Bariatrics

## 2021-10-28 VITALS — BP 147/76 | HR 66 | Temp 97.4°F | Ht 65.0 in | Wt 199.0 lb

## 2021-10-28 DIAGNOSIS — R7303 Prediabetes: Secondary | ICD-10-CM | POA: Diagnosis not present

## 2021-10-28 DIAGNOSIS — E669 Obesity, unspecified: Secondary | ICD-10-CM | POA: Diagnosis not present

## 2021-10-28 DIAGNOSIS — Z6833 Body mass index (BMI) 33.0-33.9, adult: Secondary | ICD-10-CM | POA: Diagnosis not present

## 2021-10-28 DIAGNOSIS — E559 Vitamin D deficiency, unspecified: Secondary | ICD-10-CM | POA: Diagnosis not present

## 2021-10-30 ENCOUNTER — Encounter (INDEPENDENT_AMBULATORY_CARE_PROVIDER_SITE_OTHER): Payer: Self-pay

## 2021-10-31 LAB — LIPID PANEL
Cholesterol: 196 (ref 0–200)
HDL: 3 — AB (ref 35–70)
LDL Cholesterol: 107

## 2021-10-31 LAB — HEMOGLOBIN A1C: Hemoglobin A1C: 6.3

## 2021-10-31 LAB — BASIC METABOLIC PANEL: Creatinine: 1 (ref 0.5–1.1)

## 2021-11-04 NOTE — Progress Notes (Unsigned)
Chief Complaint:   OBESITY Christy Serrano is here to discuss her progress with her obesity treatment plan along with follow-up of her obesity related diagnoses. Christy Serrano is on the Category 2 Plan and practicing portion control and making smarter food choices, such as increasing vegetables and decreasing simple carbohydrates and states she is following her eating plan approximately 0% of the time. Christy Serrano states she is doing 0 minutes 0 times per week.  Today's visit was #: 28 Starting weight: 195 lbs Starting date: 12/22/2017 Today's weight: 199 lbs Today's date: 10/28/2021 Total lbs lost to date: 0 Total lbs lost since last in-office visit: 0  Interim History: Christy Serrano is up 9 lbs since her last visit on 05/27/2021. She is not walking as much.   Subjective:   1. Pre-diabetes Christy Serrano is on not on medications.   2. Vitamin D deficiency Christy Serrano is taking Vitamin D OTC.   Assessment/Plan:   1. Pre-diabetes Christy Serrano is to keep her carbohydrates low.   2. Vitamin D deficiency Christy Serrano will continue taking Vitamin D.  3. Obesity with current BMI of 33.2 Christy Serrano is currently in the action stage of change. As such, her goal is to continue with weight loss efforts. She has agreed to the Category 3 Plan.   Meal planning was discussed.  She will increase her exercise.  Eating out sheet was given.  Exercise goals: She will walk at work and will consider water aerobics.  Behavioral modification strategies: increasing lean protein intake, decreasing simple carbohydrates, increasing vegetables, increasing water intake, decreasing eating out, no skipping meals, meal planning and cooking strategies, keeping healthy foods in the home, and planning for success.  Christy Serrano has agreed to follow-up with our clinic in 4 weeks. She was informed of the importance of frequent follow-up visits to maximize her success with intensive lifestyle modifications for her multiple health conditions.    Objective:   Blood pressure (!) 147/76, pulse 66, temperature (!) 97.4 F (36.3 C), height '5\' 5"'$  (1.651 m), weight 199 lb (90.3 kg), SpO2 96 %. Body mass index is 33.12 kg/m.  General: Cooperative, alert, well developed, in no acute distress. HEENT: Conjunctivae and lids unremarkable. Cardiovascular: Regular rhythm.  Lungs: Normal work of breathing. Neurologic: No focal deficits.   Lab Results  Component Value Date   CREATININE 0.99 08/06/2020   BUN 13 08/06/2020   NA 143 08/06/2020   K 4.1 08/06/2020   CL 104 08/06/2020   CO2 24 08/06/2020   Lab Results  Component Value Date   ALT 19 08/06/2020   AST 19 08/06/2020   ALKPHOS 87 08/06/2020   BILITOT 0.6 08/06/2020   Lab Results  Component Value Date   HGBA1C 6.1 (H) 08/06/2020   HGBA1C 5.9 (H) 09/29/2019   HGBA1C 5.7 (H) 10/20/2018   HGBA1C 5.6 05/31/2018   HGBA1C 6.1 (H) 12/22/2017   Lab Results  Component Value Date   INSULIN 10.2 08/06/2020   INSULIN 9.2 09/29/2019   INSULIN 4.8 05/31/2018   INSULIN 13.7 12/22/2017   Lab Results  Component Value Date   TSH 3.644 03/10/2018   Lab Results  Component Value Date   CHOL 187 08/06/2020   HDL 79 08/06/2020   LDLCALC 93 08/06/2020   TRIG 83 08/06/2020   Lab Results  Component Value Date   VD25OH 48.2 08/06/2020   VD25OH 48.7 09/29/2019   VD25OH 46.6 10/20/2018   Lab Results  Component Value Date   WBC 3.9 (L) 05/24/2019   HGB 11.8 (L) 05/24/2019  HCT 37.4 05/24/2019   MCV 96.1 05/24/2019   PLT 193 05/24/2019   No results found for: "IRON", "TIBC", "FERRITIN"  Attestation Statements:   Reviewed by clinician on day of visit: allergies, medications, problem list, medical history, surgical history, family history, social history, and previous encounter notes.   Wilhemena Durie, am acting as Location manager for [employer], DO.  I have reviewed the above documentation for accuracy and completeness, and I agree with the above. Jearld Lesch,  DO

## 2021-11-05 ENCOUNTER — Encounter (INDEPENDENT_AMBULATORY_CARE_PROVIDER_SITE_OTHER): Payer: Self-pay | Admitting: Bariatrics

## 2021-11-12 ENCOUNTER — Other Ambulatory Visit (INDEPENDENT_AMBULATORY_CARE_PROVIDER_SITE_OTHER): Payer: Self-pay

## 2021-12-02 ENCOUNTER — Other Ambulatory Visit: Payer: Self-pay | Admitting: Hematology and Oncology

## 2021-12-02 DIAGNOSIS — Z9889 Other specified postprocedural states: Secondary | ICD-10-CM

## 2021-12-09 ENCOUNTER — Inpatient Hospital Stay: Payer: Managed Care, Other (non HMO) | Admitting: Hematology and Oncology

## 2022-01-14 ENCOUNTER — Ambulatory Visit
Admission: RE | Admit: 2022-01-14 | Discharge: 2022-01-14 | Disposition: A | Discharge status: Home / Self Care | Payer: Managed Care, Other (non HMO) | Source: Ambulatory Visit | Attending: Hematology and Oncology | Admitting: Hematology and Oncology

## 2022-01-14 DIAGNOSIS — Z9889 Other specified postprocedural states: Secondary | ICD-10-CM

## 2022-01-16 ENCOUNTER — Other Ambulatory Visit: Payer: Self-pay | Admitting: Hematology and Oncology

## 2022-01-17 ENCOUNTER — Ambulatory Visit: Payer: Managed Care, Other (non HMO) | Admitting: Hematology and Oncology

## 2022-01-17 NOTE — Progress Notes (Signed)
Patient Care Team: Velna Hatchet, MD as PCP - General (Internal Medicine) Jovita Kussmaul, MD as Consulting Physician (General Surgery) Nicholas Lose, MD as Consulting Physician (Hematology and Oncology) Eppie Gibson, MD as Attending Physician (Radiation Oncology) Bensimhon, Shaune Pascal, MD as Consulting Physician (Cardiology)  DIAGNOSIS: No diagnosis found.  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 breast ([place])  01/18/2018 Initial Diagnosis   Screening mammogram detected microcalcifications and asymmetry, left breast segmental microcalcifications UOQ 1.5 x 4.3 x 5.4 cm biopsy-proven IDC with DCIS with LV I, grade 3, ER 0%, PR 0%, Ki-67 30%, HER-2 +3+ by IHC; hypoechoic mass 2 o'clock position 1.2 x 1.3 x 1.7 cm biopsy IDC, LV I present, grade 2-3, ER 0%, PR 0%, Ki-67 40%, HER-2 +3+, left axillary lymph node positive (3 nodes noted by [place]) T3 N1 stage IIIA   02/03/2018 Cancer Staging   Staging form: Breast, AJCC 8th Edition - Clinical: Stage IIIA (cT3, cN1, cM0, G3, ER-, PR-, HER2+) - Signed by Nicholas Lose, MD on 02/03/2018   02/20/2018 Genetic Testing   No pathogenic variants identified on the [facility] Cancer[facility] Expanded + RNA insight panel. Genes Analyzed (67 total): AIP, ALK, APC*, ATM*, BAP1, BARD1, BLM, BMPR1A, BRCA1*, BRCA2*, BRIP1*, CDH1*, CDK4, CDKN1B, CDKN2A, CHEK2*, DICER1, FANCC, FH, FLCN, GALNT12, HOXB13, MAX, MEN1, MET, MLH1*, MRE11A, MSH2*, MSH6*, MUTYH*, NBN, NF1*, NF2, PALB2*, PHOX2B, PMS2*, POLD1, POLE, POT1, PRKAR1A, PTCH1, PTEN*, RAD50, RAD51C*, RAD51D*, RB1, RET, SDHA, SDHAF2, SDHB, SDHC, SDHD, SMAD4, SMARCA4, SMARCB1, SMARCE1, STK11, SUFU, TMEM127, TP53*, TSC1, TSC2, VHL and XRCC2 (sequencing and deletion/duplication); MITF (sequencing only); EPCAM and GREM1 (deletion/duplication only). DNA and RNA analyses performed for * genes. The report date is 02/20/2018.   03/04/2018 -  Neo-Adjuvant Chemotherapy   Neoadjuvant chemotherapy  with [facility] Perjeta   03/08/2018 - 03/10/2018 Hospital Admission   Admitted for recurrent syncope with loss of consciousness for up to 5 minutes with loss of bladder, no abnormalities identified.  MRI brain, carotid ultrasound, EEG studies were negative.   06/17/2018 Breast MRI   No suspicious residual enhancement identified in the left breast, compatible with treatment response, interval decrease in size of left axillary lymph nodes   07/12/2018 Surgery   Lumpectomy (Dr. Marlou Starks): complete treatment response, no residual carcinoma present in breast or 11/11 lymph nodes.    09/02/2018 - 10/14/2018 Radiation Therapy   Adjuvant XRT   Carcinoma of upper-outer quadrant of left breast in female, estrogen receptor negative ([place])  07/12/2018 Initial Diagnosis   Carcinoma of upper-outer quadrant of left breast in female, estrogen receptor negative ([place])   08/11/2018 Cancer Staging   Staging form: Breast, AJCC 8th Edition - Pathologic: No Stage Recommended (ypT0, pN0) - Signed by Eppie Gibson, MD on 08/11/2018   08/11/2018 Cancer Staging   Staging form: Breast, AJCC 8th Edition - Clinical: Stage IIIA (cT3, cN1, cM0, G3, ER-, PR-, HER2+) - Signed by Eppie Gibson, MD on 08/11/2018   08/20/2018 - 02/25/2019 Chemotherapy   Patient is on Treatment Plan : BREAST Herceptin / Pertuzumab (Perjeta) q21d       CHIEF COMPLIANT: Follow-up of left breast cancer on surveillance    INTERVAL HISTORY: Christy Serrano is a 66 y.o. with above-mentioned history of left breast cancer treated with neoadjuvant chemotherapy, lumpectomy, radiation, Herceptin Perjeta maintenance, and is currently on surveillance. She presents to the clinic today for follow-up.     ALLERGIES:  is allergic to gabapentin, amoxicillin, oxycodone, and penicillin g.  MEDICATIONS:  Current Outpatient  Medications  Medication Sig Dispense Refill   acetaminophen (TYLENOL) 500 MG tablet Take 1,000 mg by mouth every 6 (six) hours as needed for mild  pain.     albuterol (PROVENTIL HFA;VENTOLIN HFA) 108 (90 Base) MCG/ACT inhaler Inhale into the lungs every 6 (six) hours as needed for wheezing or shortness of breath.     budesonide-formoterol (SYMBICORT) 160-4.5 MCG/ACT inhaler Inhale 2 puffs into the lungs 2 (two) times daily.     Calcium-Phosphorus-Vitamin D (CITRACAL +D3 PO) Take 1,200 mcg by mouth.     cholecalciferol (VITAMIN D) 1000 units tablet Take 1,000 Units by mouth daily.     COVID-19 mRNA bivalent vaccine, Pfizer, (PFIZER COVID-19 VAC BIVALENT) injection Inject into the muscle. 0.3 mL 0   COVID-19 mRNA Vac-TriS, Pfizer, (PFIZER-BIONT COVID-19 VAC-TRIS) SUSP injection Inject into the muscle. 0.3 mL 0   DOTTI 0.025 MG/24HR APPLY 1 PATCH TOPICALLY TO SKIN  TWICE WEEKLY 24 patch 3   meloxicam (MOBIC) 15 MG tablet Take 1 tablet (15 mg total) by mouth daily for 30 days. 30 tablet 2   Multiple Vitamins-Minerals (COMPLETE SENIOR PO) Take 1 tablet by mouth daily.     No current facility-administered medications for this visit.    PHYSICAL EXAMINATION: ECOG PERFORMANCE STATUS: {CHL ONC ECOG PS:[id number]}  There were no vitals filed for this visit. There were no vitals filed for this visit.  BREAST:*** No palpable masses or nodules in either right or left breasts. No palpable axillary supraclavicular or infraclavicular adenopathy no breast tenderness or nipple discharge. (exam performed in the presence of a chaperone)  LABORATORY DATA:  I have reviewed the data as listed    Latest Ref Rng & Units 10/31/2021   12:00 AM 08/06/2020    8:53 AM 09/29/2019    1:56 PM  CMP  Glucose 65 - 99 mg/dL  86  88   BUN 8 - 27 mg/dL  13  14   Creatinine 0.5 - 1.1 1.0     0.99  0.98   Sodium 134 - 144 mmol/L  143  143   Potassium 3.5 - 5.2 mmol/L  4.1  4.1   Chloride 96 - 106 mmol/L  104  103   CO2 20 - 29 mmol/L  24  25   Calcium 8.7 - 10.3 mg/dL  9.4  9.3   Total Protein 6.0 - 8.5 g/dL  7.2  7.3   Total Bilirubin 0.0 - 1.2 mg/dL  0.6  0.6    Alkaline Phos 44 - 121 IU/L  87  72   AST 0 - 40 IU/L  19  19   ALT 0 - 32 IU/L  19  13      This result is from an external source.    Lab Results  Component Value Date   WBC 3.9 (L) 05/24/2019   HGB 11.8 (L) 05/24/2019   HCT 37.4 05/24/2019   MCV 96.1 05/24/2019   PLT 193 05/24/2019   NEUTROABS 2.2 05/24/2019    ASSESSMENT & PLAN:  No problem-specific Assessment & Plan notes found for this encounter.    No orders of the defined types were placed in this encounter.  The patient has a good understanding of the overall plan. she agrees with it. she will call with any problems that may develop before the next visit here. Total time spent: 30 mins including face to face time and time spent for planning, charting and co-ordination of care   Christy Serrano, CMA 01/17/22      I Christy, Serrano am scribing for Dr. Gudena  ***  

## 2022-01-20 ENCOUNTER — Inpatient Hospital Stay: Payer: Managed Care, Other (non HMO) | Attending: Hematology and Oncology | Admitting: Hematology and Oncology

## 2022-01-20 DIAGNOSIS — Z79899 Other long term (current) drug therapy: Secondary | ICD-10-CM | POA: Insufficient documentation

## 2022-01-20 DIAGNOSIS — C50412 Malignant neoplasm of upper-outer quadrant of left female breast: Secondary | ICD-10-CM | POA: Diagnosis present

## 2022-01-20 DIAGNOSIS — Z923 Personal history of irradiation: Secondary | ICD-10-CM | POA: Diagnosis not present

## 2022-01-20 DIAGNOSIS — Z171 Estrogen receptor negative status [ER-]: Secondary | ICD-10-CM | POA: Insufficient documentation

## 2022-01-20 DIAGNOSIS — Z9221 Personal history of antineoplastic chemotherapy: Secondary | ICD-10-CM | POA: Diagnosis not present

## 2022-01-20 NOTE — Assessment & Plan Note (Addendum)
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, grade 3, ER 0%, PR 0%, Ki-67 30%, HER-2 +3+ by IHC; hypoechoic mass 2 o'clock position 1.2 x 1.3 x 1.7 cm biopsy IDC, LV I present, grade 2-3, ER 0%, PR 0%, Ki-67 40%, HER-2 +3+, left axillary lymph node positive (3 nodes noted by [place]) T3 N1 stage IIIA  Treatment plan 1.Neoadjuvant chemotherapy with [place] completed 3/26/2020followed by Herceptin Perjeta maintenancecompleted December 2020 2.lumpectomywith axillary lymphnode dissection: 07/12/2018 (Dr. Marlou Starks): complete treatment response, no residual carcinoma present in breast or 11/11 lymph nodes. 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 Discussed the pros and cons of neratinib and decided not to pursue it.  Breast cancer surveillance: Mammogram  01/14/2022: Benign, breast density category C Breast exam  01/20/2022: Benign  Return to clinic in1 yearfor follow-up.

## 2022-01-31 ENCOUNTER — Other Ambulatory Visit (HOSPITAL_BASED_OUTPATIENT_CLINIC_OR_DEPARTMENT_OTHER): Payer: Self-pay

## 2022-01-31 MED ORDER — COMIRNATY 30 MCG/0.3ML IM SUSY
PREFILLED_SYRINGE | INTRAMUSCULAR | 0 refills | Status: DC
  Filled 2022-01-31: qty 0.3, 1d supply, fill #0

## 2022-04-01 ENCOUNTER — Ambulatory Visit: Payer: Managed Care, Other (non HMO) | Admitting: Bariatrics

## 2022-04-02 ENCOUNTER — Encounter: Payer: Self-pay | Admitting: Bariatrics

## 2022-04-02 ENCOUNTER — Ambulatory Visit: Payer: Managed Care, Other (non HMO) | Admitting: Bariatrics

## 2022-04-02 VITALS — BP 146/75 | HR 66 | Temp 98.3°F | Ht 65.0 in | Wt 199.0 lb

## 2022-04-02 DIAGNOSIS — E669 Obesity, unspecified: Secondary | ICD-10-CM

## 2022-04-02 DIAGNOSIS — R7303 Prediabetes: Secondary | ICD-10-CM | POA: Diagnosis not present

## 2022-04-02 DIAGNOSIS — E88819 Insulin resistance, unspecified: Secondary | ICD-10-CM

## 2022-04-02 DIAGNOSIS — Z6831 Body mass index (BMI) 31.0-31.9, adult: Secondary | ICD-10-CM

## 2022-04-03 DIAGNOSIS — E88819 Insulin resistance, unspecified: Secondary | ICD-10-CM | POA: Insufficient documentation

## 2022-04-13 NOTE — Progress Notes (Signed)
Chief Complaint:   OBESITY Christy Serrano is here to discuss her progress with her obesity treatment plan along with follow-up of her obesity related diagnoses. Christy Serrano is on the Category 3 Plan and states she is following her eating plan approximately 0% of the time. Christy Serrano states she is walking 15-20 minutes 3 times per week.  Today's visit was #: 75 Starting weight: 195 lbs Starting date: 12/22/2017 Today's weight: 199 lbs Today's date: 04/02/2022 Total lbs lost to date: 0 Total lbs lost since last in-office visit: 0  Interim History: Christy Serrano's weight remains the same since her last visit.  Subjective:   1. Insulin resistance Naavya is not on medication.  2. Pre-diabetes Christy Serrano is not on medication.  Assessment/Plan:   1. Insulin resistance Christy Serrano will reduce all carbohydrates (sugar and starches).  2. Pre-diabetes Increase activities and exercise.  3. Obesity with current BMI of 33.1 Christy Serrano is currently in the action stage of change. As such, her goal is to continue with weight loss efforts. She has agreed to the Category 3 Plan.   Meal planning. Christy Serrano will adhere more closely to the plan.  Exercise goals:  As is  Behavioral modification strategies: increasing lean protein intake, decreasing simple carbohydrates, increasing vegetables, increasing water intake, decreasing eating out, no skipping meals, meal planning and cooking strategies, keeping healthy foods in the home, and planning for success.  Christy Serrano has agreed to follow-up with our clinic in 5 weeks. She was informed of the importance of frequent follow-up visits to maximize her success with intensive lifestyle modifications for her multiple health conditions.   Objective:   Blood pressure (!) 146/75, pulse 66, temperature 98.3 F (36.8 C), height '5\' 5"'$  (1.651 m), weight 199 lb (90.3 kg), SpO2 100 %. Body mass index is 33.12 kg/m.  General: Cooperative, alert, well developed, in no acute  distress. HEENT: Conjunctivae and lids unremarkable. Cardiovascular: Regular rhythm.  Lungs: Normal work of breathing. Neurologic: No focal deficits.   Lab Results  Component Value Date   CREATININE 1.0 10/31/2021   BUN 13 08/06/2020   NA 143 08/06/2020   K 4.1 08/06/2020   CL 104 08/06/2020   CO2 24 08/06/2020   Lab Results  Component Value Date   ALT 19 08/06/2020   AST 19 08/06/2020   ALKPHOS 87 08/06/2020   BILITOT 0.6 08/06/2020   Lab Results  Component Value Date   HGBA1C 6.3 10/31/2021   HGBA1C 6.1 (H) 08/06/2020   HGBA1C 5.9 (H) 09/29/2019   HGBA1C 5.7 (H) 10/20/2018   HGBA1C 5.6 05/31/2018   Lab Results  Component Value Date   INSULIN 10.2 08/06/2020   INSULIN 9.2 09/29/2019   INSULIN 4.8 05/31/2018   INSULIN 13.7 12/22/2017   Lab Results  Component Value Date   TSH 3.644 03/10/2018   Lab Results  Component Value Date   CHOL 196 10/31/2021   HDL 3 (A) 10/31/2021   LDLCALC 107 10/31/2021   TRIG 83 08/06/2020   Lab Results  Component Value Date   VD25OH 48.2 08/06/2020   VD25OH 48.7 09/29/2019   VD25OH 46.6 10/20/2018   Lab Results  Component Value Date   WBC 3.9 (L) 05/24/2019   HGB 11.8 (L) 05/24/2019   HCT 37.4 05/24/2019   MCV 96.1 05/24/2019   PLT 193 05/24/2019    Attestation Statements:   Reviewed by clinician on day of visit: allergies, medications, problem list, medical history, surgical history, family history, social history, and previous encounter notes.  Caesar Chestnut  Mare Ferrari, BS, CMA, am acting as Location manager for [employer], DO.  I have reviewed the above documentation for accuracy and completeness, and I agree with the above. Jearld Lesch, DO

## 2022-04-14 ENCOUNTER — Encounter: Payer: Self-pay | Admitting: Bariatrics

## 2022-05-13 ENCOUNTER — Other Ambulatory Visit (HOSPITAL_BASED_OUTPATIENT_CLINIC_OR_DEPARTMENT_OTHER): Payer: Self-pay

## 2022-06-03 ENCOUNTER — Other Ambulatory Visit: Payer: Self-pay | Admitting: Hematology and Oncology

## 2022-06-03 DIAGNOSIS — Z1231 Encounter for screening mammogram for malignant neoplasm of breast: Secondary | ICD-10-CM

## 2022-08-04 ENCOUNTER — Other Ambulatory Visit: Payer: Self-pay

## 2022-08-11 ENCOUNTER — Encounter: Payer: Self-pay | Admitting: Hematology and Oncology

## 2022-09-01 ENCOUNTER — Ambulatory Visit: Payer: Managed Care, Other (non HMO) | Admitting: Bariatrics

## 2022-09-03 ENCOUNTER — Encounter: Payer: Self-pay | Admitting: Bariatrics

## 2022-09-03 ENCOUNTER — Ambulatory Visit: Payer: Managed Care, Other (non HMO) | Admitting: Bariatrics

## 2022-09-03 VITALS — BP 159/75 | HR 85 | Temp 98.1°F | Ht 65.0 in | Wt 198.0 lb

## 2022-09-03 DIAGNOSIS — Z6832 Body mass index (BMI) 32.0-32.9, adult: Secondary | ICD-10-CM

## 2022-09-03 DIAGNOSIS — R7303 Prediabetes: Secondary | ICD-10-CM

## 2022-09-03 DIAGNOSIS — E669 Obesity, unspecified: Secondary | ICD-10-CM | POA: Diagnosis not present

## 2022-09-03 NOTE — Progress Notes (Signed)
   WEIGHT SUMMARY AND BIOMETRICS  Weight Lost Since Last Visit: 1lb   Vitals Temp: 98.1 F (36.7 C) BP: (!) 159/75 Pulse Rate: 85 SpO2: 100 %   Anthropometric Measurements Height: 5\' 5"  (1.651 m) Weight: 198 lb (89.8 kg) BMI (Calculated): 32.95 Weight at Last Visit: 199lb Weight Lost Since Last Visit: 1lb Starting Weight: 195lb Total Weight Loss (lbs): 0 lb (0 kg)   Body Composition  Body Fat %: 41.8 % Fat Mass (lbs): 83 lbs Muscle Mass (lbs): 109.8 lbs Total Body Water (lbs): 82 lbs Visceral Fat Rating : 12   Other Clinical Data Fasting: no Labs: no Today's Visit #: 30 Starting Date: 12/22/17    OBESITY Christy Serrano is here to discuss her progress with her obesity treatment plan along with follow-up of her obesity related diagnoses.     Nutrition Plan: the Category 3 plan - 35% adherence.  Current exercise: walking  Interim History:  She is down 1 lb since her last visit.  Protein intake is as prescribed, Is not skipping meals, Water intake is adequate., and Denies polyphagia   Hunger is moderately controlled.  Cravings are moderately controlled.  Assessment/Plan:   Prediabetes Last A1c was 6.3  Medication(s):   Lab Results  Component Value Date   HGBA1C 6.3 10/31/2021   HGBA1C 6.1 (H) 08/06/2020   HGBA1C 5.9 (H) 09/29/2019   HGBA1C 5.7 (H) 10/20/2018   HGBA1C 5.6 05/31/2018   Lab Results  Component Value Date   INSULIN 10.2 08/06/2020   INSULIN 9.2 09/29/2019   INSULIN 4.8 05/31/2018   INSULIN 13.7 12/22/2017    Plan: Will minimize all refined carbohydrates both sweets and starches.  Will work on the plan and exercise.  Consider both aerobic and resistance training.  Will keep protein, water, and fiber intake high.  Increase Polyunsaturated and Monounsaturated fats to increase satiety and encourage weight loss.  Aim for 7 to 9 hours of sleep nightly.  Will continue medications.     Generalized Obesity: Current BMI BMI  (Calculated): 32.95    Christy Serrano is currently in the action stage of change. As such, her goal is to continue with weight loss efforts.  She has agreed to the Category 3 plan.  Exercise goals: Older adults should determine their level of effort for physical activity relative to their level of fitness.   Behavioral modification strategies: increasing lean protein intake, meal planning , and increasing fiber rich foods.  Christy Serrano has agreed to follow-up with our clinic in 4 to 6 months.       Objective:   VITALS: Per patient if applicable, see vitals. GENERAL: Alert and in no acute distress. CARDIOPULMONARY: No increased WOB. Speaking in clear sentences.  PSYCH: Pleasant and cooperative. Speech normal rate and rhythm. Affect is appropriate. Insight and judgement are appropriate. Attention is focused, linear, and appropriate.  NEURO: Oriented as arrived to appointment on time with no prompting.   Attestation Statements:    This was prepared with the assistance of Engineer, civil (consulting).  Occasional wrong-word or sound-a-like substitutions may have occurred due to the inherent limitations of voice recognition software.   Corinna Capra, DO

## 2022-09-15 ENCOUNTER — Other Ambulatory Visit (HOSPITAL_BASED_OUTPATIENT_CLINIC_OR_DEPARTMENT_OTHER): Payer: Self-pay

## 2022-10-06 ENCOUNTER — Encounter: Payer: Self-pay | Admitting: Hematology and Oncology

## 2022-10-07 ENCOUNTER — Encounter: Payer: Self-pay | Admitting: *Deleted

## 2022-12-04 ENCOUNTER — Other Ambulatory Visit (HOSPITAL_BASED_OUTPATIENT_CLINIC_OR_DEPARTMENT_OTHER): Payer: Self-pay

## 2022-12-04 MED ORDER — FLUAD 0.5 ML IM SUSY
0.5000 mL | PREFILLED_SYRINGE | Freq: Once | INTRAMUSCULAR | 0 refills | Status: AC
  Filled 2022-12-04: qty 0.5, 1d supply, fill #0

## 2022-12-04 MED ORDER — COMIRNATY 30 MCG/0.3ML IM SUSY
0.3000 mL | PREFILLED_SYRINGE | Freq: Once | INTRAMUSCULAR | 0 refills | Status: AC
  Filled 2022-12-04: qty 0.3, 1d supply, fill #0

## 2023-01-12 ENCOUNTER — Telehealth: Payer: Self-pay | Admitting: Hematology and Oncology

## 2023-01-12 NOTE — Telephone Encounter (Signed)
Rescheduled appointment per provider on call. Left voicemail with appointment details.

## 2023-01-15 ENCOUNTER — Encounter: Payer: Self-pay | Admitting: Internal Medicine

## 2023-01-21 ENCOUNTER — Ambulatory Visit: Payer: Managed Care, Other (non HMO) | Admitting: Hematology and Oncology

## 2023-01-21 ENCOUNTER — Ambulatory Visit
Admission: RE | Admit: 2023-01-21 | Discharge: 2023-01-21 | Disposition: A | Discharge status: Home / Self Care | Payer: Managed Care, Other (non HMO) | Source: Ambulatory Visit | Attending: Hematology and Oncology

## 2023-01-21 DIAGNOSIS — Z1231 Encounter for screening mammogram for malignant neoplasm of breast: Secondary | ICD-10-CM

## 2023-01-28 ENCOUNTER — Ambulatory Visit: Payer: Managed Care, Other (non HMO) | Admitting: Hematology and Oncology

## 2023-02-05 ENCOUNTER — Ambulatory Visit: Payer: Managed Care, Other (non HMO) | Admitting: Bariatrics

## 2023-02-05 ENCOUNTER — Encounter: Payer: Self-pay | Admitting: Bariatrics

## 2023-02-05 VITALS — BP 159/75 | HR 63 | Temp 97.6°F | Ht 65.0 in | Wt 193.0 lb

## 2023-02-05 DIAGNOSIS — E669 Obesity, unspecified: Secondary | ICD-10-CM

## 2023-02-05 DIAGNOSIS — R7303 Prediabetes: Secondary | ICD-10-CM | POA: Diagnosis not present

## 2023-02-05 DIAGNOSIS — E66811 Obesity, class 1: Secondary | ICD-10-CM

## 2023-02-05 DIAGNOSIS — Z6832 Body mass index (BMI) 32.0-32.9, adult: Secondary | ICD-10-CM

## 2023-02-05 DIAGNOSIS — R030 Elevated blood-pressure reading, without diagnosis of hypertension: Secondary | ICD-10-CM | POA: Diagnosis not present

## 2023-02-05 NOTE — Progress Notes (Signed)
WEIGHT SUMMARY AND BIOMETRICS  Weight Lost Since Last Visit: 5lb  Weight Gained Since Last Visit: 0   Vitals Temp: 97.6 F (36.4 C) BP: (!) 159/75 Pulse Rate: 63 SpO2: 100 %   Anthropometric Measurements Height: 5\' 5"  (1.651 m) Weight: 193 lb (87.5 kg) BMI (Calculated): 32.12 Weight at Last Visit: 198lb Weight Lost Since Last Visit: 5lb Weight Gained Since Last Visit: 0 Starting Weight: 195lb Total Weight Loss (lbs): 2 lb (0.907 kg)   Body Composition  Body Fat %: 41.2 % Fat Mass (lbs): 79.6 lbs Muscle Mass (lbs): 108 lbs Total Body Water (lbs): 79.2 lbs Visceral Fat Rating : 12   Other Clinical Data Fasting: no Labs: no Today's Visit #: 31 Starting Date: 12/22/17    OBESITY Christy Serrano is here to discuss her progress with her obesity treatment plan along with follow-up of her obesity related diagnoses.    Nutrition Plan: the Category 3 plan - 0% adherence. Christy Serrano is counting calories 1300 calories a day.  Current exercise: walking  Interim History:  She is down 5 lbs since her last visit.  Eating all of the food on the plan., Protein intake is as prescribed, Is not exceeding snack calorie allotment, and Water intake is adequate.   Hunger is moderately controlled.  Cravings are moderately controlled.  Assessment/Plan:   Elevated blood pressure without diagnosis of hypertension Blood pressure is noted to be borderline elevated today, but normal in the past. She occasionally checks her blood pressure.  Christy Serrano denies chest pains or SOB. She is up to date with labs.  BP Readings from Last 3 Encounters:  02/05/23 (!) 159/75  09/03/22 (!) 159/75  04/02/22 (!) 146/75    Plan: Continue to monitor.  If blood pressure continues to be elevated at future office visits, will consider starting antihypertensive medication. Patient will recheck BP  at home in the AM 3 times a week and will bring in readings to her PCP.  No added salt.     Prediabetes Last A1c was 6.3  Medication(s): none Lab Results  Component Value Date   HGBA1C 6.3 10/31/2021   HGBA1C 6.1 (H) 08/06/2020   HGBA1C 5.9 (H) 09/29/2019   HGBA1C 5.7 (H) 10/20/2018   HGBA1C 5.6 05/31/2018   Lab Results  Component Value Date   INSULIN 10.2 08/06/2020   INSULIN 9.2 09/29/2019   INSULIN 4.8 05/31/2018   INSULIN 13.7 12/22/2017    Plan: Will minimize all refined carbohydrates both sweets and starches.  Will work on the plan and exercise.  Consider both aerobic and resistance training.  Will keep protein, water, and fiber intake high.  Increase Polyunsaturated and Monounsaturated fats to increase satiety and encourage weight loss.  Aim for 7 to 9 hours of sleep nightly.  Will continue medications.     Generalized Obesity: Current BMI BMI (Calculated): 32.12   Christy Serrano is not  currently in the action stage of change. As such, her goal is to continue with weight loss efforts.  She has agreed to the Category 3 plan.  Exercise goals: Older adults should do exercises that maintain or improve balance if they are at risk of falling.  She is walking at work and getting her heart rate up. She is using my fitness pal.   Behavioral modification strategies: increasing lean protein intake, no meal skipping, meal planning , increase water intake, better snacking choices, planning for success, increasing vegetables, avoiding temptations, and keep healthy foods in the home. Discussed CHATGPT  and how to use to come up with good food options. Also discussed Dr. Dorina Hoyer Anti-inflammatory diet options.and handouts given.   Christy Serrano has agreed to follow-up with our clinic in 6 weeks.      Objective:   VITALS: Per patient if applicable, see vitals. GENERAL: Alert and in no acute distress. CARDIOPULMONARY: No increased WOB. Speaking in clear sentences.  PSYCH: Pleasant and  cooperative. Speech normal rate and rhythm. Affect is appropriate. Insight and judgement are appropriate. Attention is focused, linear, and appropriate.  NEURO: Oriented as arrived to appointment on time with no prompting.   Attestation Statements:    This was prepared with the assistance of Engineer, civil (consulting).  Occasional wrong-word or sound-a-like substitutions may have occurred due to the inherent limitations of voice recognition  Corinna Capra, DO

## 2023-02-17 ENCOUNTER — Inpatient Hospital Stay: Payer: Managed Care, Other (non HMO) | Attending: Hematology and Oncology | Admitting: Hematology and Oncology

## 2023-02-17 VITALS — BP 158/64 | HR 70 | Temp 97.6°F | Resp 18 | Ht 65.0 in | Wt 197.1 lb

## 2023-02-17 DIAGNOSIS — Z923 Personal history of irradiation: Secondary | ICD-10-CM | POA: Insufficient documentation

## 2023-02-17 DIAGNOSIS — Z171 Estrogen receptor negative status [ER-]: Secondary | ICD-10-CM | POA: Diagnosis not present

## 2023-02-17 DIAGNOSIS — Z9221 Personal history of antineoplastic chemotherapy: Secondary | ICD-10-CM | POA: Diagnosis not present

## 2023-02-17 DIAGNOSIS — C50412 Malignant neoplasm of upper-outer quadrant of left female breast: Secondary | ICD-10-CM | POA: Diagnosis not present

## 2023-02-17 NOTE — Progress Notes (Signed)
Patient Care Team: Alysia Penna, MD as PCP - General (Internal Medicine) Griselda Miner, MD as Consulting Physician (General Surgery) Serena Croissant, MD as Consulting Physician (Hematology and Oncology) Lonie Peak, MD as Attending Physician (Radiation Oncology) Bensimhon, Bevelyn Buckles, MD as Consulting Physician (Cardiology) Lavina Hamman, MD as Consulting Physician (Obstetrics and Gynecology)  DIAGNOSIS:  Encounter Diagnosis  Name Primary?   Malignant neoplasm of upper-outer quadrant of left breast in female, estrogen receptor negative (HCC) Yes    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 breast (HCC)  01/18/2018 Initial Diagnosis   Screening mammogram detected microcalcifications and asymmetry, left breast segmental microcalcifications UOQ 1.5 x 4.3 x 5.4 cm biopsy-proven IDC with DCIS with LV I, grade 3, ER 0%, PR 0%, Ki-67 30%, HER-2 +3+ by IHC; hypoechoic mass 2 o'clock position 1.2 x 1.3 x 1.7 cm biopsy IDC, LV I present, grade 2-3, ER 0%, PR 0%, Ki-67 40%, HER-2 +3+, left axillary lymph node positive (3 nodes noted by [place]) T3 N1 stage IIIA   02/03/2018 Cancer Staging   Staging form: Breast, AJCC 8th Edition - Clinical: Stage IIIA (cT3, cN1, cM0, G3, ER-, PR-, HER2+) - Signed by Serena Croissant, MD on 02/03/2018   02/20/2018 Genetic Testing   No pathogenic variants identified on the [facility] Expanded + RNA insight panel. Genes Analyzed (67 total): AIP, ALK, APC*, ATM*, BAP1, BARD1, BLM, BMPR1A, BRCA1*, BRCA2*, BRIP1*, CDH1*, CDK4, CDKN1B, CDKN2A, CHEK2*, DICER1, FANCC, FH, FLCN, GALNT12, HOXB13, MAX, MEN1, MET, MLH1*, MRE11A, MSH2*, MSH6*, MUTYH*, NBN, NF1*, NF2, PALB2*, PHOX2B, PMS2*, POLD1, POLE, POT1, PRKAR1A, PTCH1, PTEN*, RAD50, RAD51C*, RAD51D*, RB1, RET, SDHA, SDHAF2, SDHB, SDHC, SDHD, SMAD4, SMARCA4, SMARCB1, SMARCE1, STK11, SUFU, TMEM127, TP53*, TSC1, TSC2, VHL and XRCC2 (sequencing and deletion/duplication); MITF  (sequencing only); EPCAM and GREM1 (deletion/duplication only). DNA and RNA analyses performed for * genes. The report date is 02/20/2018.   03/04/2018 -  Neo-Adjuvant Chemotherapy   Neoadjuvant chemotherapy with [facility] Perjeta   03/08/2018 - 03/10/2018 Hospital Admission   Admitted for recurrent syncope with loss of consciousness for up to 5 minutes with loss of bladder, no abnormalities identified.  MRI brain, carotid ultrasound, EEG studies were negative.   06/17/2018 Breast MRI   No suspicious residual enhancement identified in the left breast, compatible with treatment response, interval decrease in size of left axillary lymph nodes   07/12/2018 Surgery   Lumpectomy (Dr. Carolynne Edouard): complete treatment response, no residual carcinoma present in breast or 11/11 lymph nodes.    09/02/2018 - 10/14/2018 Radiation Therapy   Adjuvant XRT   Carcinoma of upper-outer quadrant of left breast in female, estrogen receptor negative (HCC)  07/12/2018 Initial Diagnosis   Carcinoma of upper-outer quadrant of left breast in female, estrogen receptor negative (HCC)   08/11/2018 Cancer Staging   Staging form: Breast, AJCC 8th Edition - Pathologic: No Stage Recommended (ypT0, pN0) - Signed by Lonie Peak, MD on 08/11/2018   08/11/2018 Cancer Staging   Staging form: Breast, AJCC 8th Edition - Clinical: Stage IIIA (cT3, cN1, cM0, G3, ER-, PR-, HER2+) - Signed by Lonie Peak, MD on 08/11/2018   08/20/2018 - 02/25/2019 Chemotherapy   Patient is on Treatment Plan : BREAST Herceptin / Pertuzumab (Perjeta) q21d       CHIEF COMPLIANT: Surveillance of breast cancer  HISTORY OF PRESENT ILLNESS:   History of Present Illness   The patient, a breast cancer survivor, is five years post-diagnosis and reports doing well overall. She had previously experienced breast  pain, which she attributes to healing. She had been on an estradiol patch, but discontinued it due to severe hot flashes. She is currently managing her  menopausal symptoms with Veozah, which has reduced the frequency and severity of her hot flashes, although she still experiences some breakthrough heat, primarily at night.  The patient also reports persistent numbness in her feet, a symptom that does not cause pain but occasionally leads to cramping with certain movements. She had been on spironolactone, which was discontinued due to leg swelling. The swelling has not recurred since discontinuation of the medication. She also refuses to take gabapentin, which is noted in her allergy list along with amoxicillin, oxycodone, and penicillin G.         ALLERGIES:  is allergic to gabapentin, amoxicillin, oxycodone, and penicillin g.  MEDICATIONS:  Current Outpatient Medications  Medication Sig Dispense Refill   acetaminophen (TYLENOL) 500 MG tablet Take 1,000 mg by mouth every 6 (six) hours as needed for mild pain.     albuterol (PROVENTIL HFA;VENTOLIN HFA) 108 (90 Base) MCG/ACT inhaler Inhale into the lungs every 6 (six) hours as needed for wheezing or shortness of breath.     Calcium-Phosphorus-Vitamin D (CITRACAL +D3 PO) Take 1,200 mcg by mouth.     cholecalciferol (VITAMIN D) 1000 units tablet Take 1,000 Units by mouth daily.     fluticasone (FLONASE) 50 MCG/ACT nasal spray Place into both nostrils daily.     meloxicam (MOBIC) 15 MG tablet Take 15 mg by mouth daily.     Multiple Vitamins-Minerals (COMPLETE SENIOR PO) Take 1 tablet by mouth daily.     VEOZAH 45 MG TABS Take 1 tablet every day by oral route for 90 days.     No current facility-administered medications for this visit.    PHYSICAL EXAMINATION: ECOG PERFORMANCE STATUS: 1 - Symptomatic but completely ambulatory  Vitals:   02/17/23 1513  BP: (!) 158/64  Pulse: 70  Resp: 18  Temp: 97.6 F (36.4 C)  SpO2: 100%   Filed Weights   02/17/23 1513  Weight: 197 lb 1.6 oz (89.4 kg)      LABORATORY DATA:  I have reviewed the data as listed    Latest Ref Rng & Units  10/31/2021   12:00 AM 08/06/2020    8:53 AM 09/29/2019    1:56 PM  CMP  Glucose 65 - 99 mg/dL  86  88   BUN 8 - 27 mg/dL  13  14   Creatinine 0.5 - 1.1 1.0     0.99  0.98   Sodium 134 - 144 mmol/L  143  143   Potassium 3.5 - 5.2 mmol/L  4.1  4.1   Chloride 96 - 106 mmol/L  104  103   CO2 20 - 29 mmol/L  24  25   Calcium 8.7 - 10.3 mg/dL  9.4  9.3   Total Protein 6.0 - 8.5 g/dL  7.2  7.3   Total Bilirubin 0.0 - 1.2 mg/dL  0.6  0.6   Alkaline Phos 44 - 121 IU/L  87  72   AST 0 - 40 IU/L  19  19   ALT 0 - 32 IU/L  19  13      This result is from an external source.    Lab Results  Component Value Date   WBC 3.9 (L) 05/24/2019   HGB 11.8 (L) 05/24/2019   HCT 37.4 05/24/2019   MCV 96.1 05/24/2019   PLT 193 05/24/2019  NEUTROABS 2.2 05/24/2019    ASSESSMENT & PLAN:  Malignant neoplasm of left female breast (HCC) 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, grade 3, ER 0%, PR 0%, Ki-67 30%, HER-2 +3+ by IHC; hypoechoic mass 2 o'clock position 1.2 x 1.3 x 1.7 cm biopsy IDC, LV I present, grade 2-3, ER 0%, PR 0%, Ki-67 40%, HER-2 +3+, left axillary lymph node positive (3 nodes noted by [place]) T3 N1 stage IIIA   Treatment plan 1.  Neoadjuvant chemotherapy with [facility] Perjeta completed 06/17/2018 followed by Herceptin Perjeta maintenance completed December 2020 2.  lumpectomy with axillary lymph node dissection: 07/12/2018 (Dr. Carolynne Edouard): complete treatment response, no residual carcinoma present in breast or 11/11 lymph nodes.  3.  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 Discussed the pros and cons of neratinib and decided not to pursue it.   Breast cancer surveillance:  Mammogram 01/26/2023: Benign, breast density category C Breast exam 02/17/2023: Benign   Return to clinic in  1 year for follow-up.   -No orders of the defined types were placed in this encounter.  The patient has a good understanding of the overall plan. she agrees with it. she will call with any problems that may develop before the next visit here. Total time spent: 30 mins including face to face time and time spent for planning, charting and co-ordination of care   Tamsen Meek, MD 02/17/23

## 2023-02-17 NOTE — Assessment & Plan Note (Signed)
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, grade 3, ER 0%, PR 0%, Ki-67 30%, HER-2 +3+ by IHC; hypoechoic mass 2 o'clock position 1.2 x 1.3 x 1.7 cm biopsy IDC, LV I present, grade 2-3, ER 0%, PR 0%, Ki-67 40%, HER-2 +3+, left axillary lymph node positive (3 nodes noted by [place]) T3 N1 stage IIIA   Treatment plan 1.  Neoadjuvant chemotherapy with [facility] Perjeta completed 06/17/2018 followed by Herceptin Perjeta maintenance completed December 2020 2.  lumpectomy with axillary lymph node dissection: 07/12/2018 (Dr. Carolynne Edouard): complete treatment response, no residual carcinoma present in breast or 11/11 lymph nodes.  3.  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 Discussed the pros and cons of neratinib and decided not to pursue it.   Breast cancer surveillance:  Mammogram 01/26/2023: Benign, breast density category C Breast exam 02/17/2023: Benign   Return to clinic in  1 year for follow-up.

## 2023-05-05 ENCOUNTER — Other Ambulatory Visit (HOSPITAL_BASED_OUTPATIENT_CLINIC_OR_DEPARTMENT_OTHER): Payer: Self-pay

## 2023-07-15 ENCOUNTER — Ambulatory Visit: Payer: Managed Care, Other (non HMO) | Admitting: Bariatrics

## 2023-07-15 ENCOUNTER — Encounter: Payer: Self-pay | Admitting: Bariatrics

## 2023-07-15 VITALS — BP 165/74 | HR 67 | Temp 97.6°F | Ht 65.0 in | Wt 190.0 lb

## 2023-07-15 DIAGNOSIS — R7303 Prediabetes: Secondary | ICD-10-CM | POA: Diagnosis not present

## 2023-07-15 DIAGNOSIS — Z6831 Body mass index (BMI) 31.0-31.9, adult: Secondary | ICD-10-CM | POA: Diagnosis not present

## 2023-07-15 DIAGNOSIS — E669 Obesity, unspecified: Secondary | ICD-10-CM

## 2023-07-15 DIAGNOSIS — E66811 Obesity, class 1: Secondary | ICD-10-CM

## 2023-07-15 NOTE — Progress Notes (Unsigned)
 WEIGHT SUMMARY AND BIOMETRICS  Weight Lost Since Last Visit: 3lb  Weight Gained Since Last Visit: 0   Vitals Temp: 97.6 F (36.4 C) BP: (!) 165/74 Pulse Rate: 67 SpO2: 99 %   Anthropometric Measurements Height: 5\' 5"  (1.651 m) Weight: 190 lb (86.2 kg) BMI (Calculated): 31.62 Weight at Last Visit: 193lb Weight Lost Since Last Visit: 3lb Weight Gained Since Last Visit: 0 Starting Weight: 195 Total Weight Loss (lbs): 5 lb (2.268 kg)   Body Composition  Body Fat %: 41.3 % Fat Mass (lbs): 78.6 lbs Muscle Mass (lbs): 106.2 lbs Total Body Water (lbs): 79.6 lbs Visceral Fat Rating : 12   Other Clinical Data Fasting: no Labs: no Today's Visit #: 32 Starting Date: 12/22/17    OBESITY Christy Serrano is here to discuss her progress with her obesity treatment plan along with follow-up of her obesity related diagnoses.    Nutrition Plan: the Category 3 plan - 35% adherence.  Current exercise: no regular exercise  Interim History:  She is down 3 lbs since last visit.  {aabnutritionassessment:29213}   Pharmacotherapy: Christy Serrano is on {dwwpharmacotherapy:29109} Adverse side effects: {dwwse:29122} Hunger is {EWCONTROLASSESSMENT:24261}.  Cravings are {EWCONTROLASSESSMENT:24261}.  Assessment/Plan:   Prediabetes Last A1c was 6.3  Medication(s): none Lab Results  Component Value Date   HGBA1C 6.3 10/31/2021   HGBA1C 6.1 (H) 08/06/2020   HGBA1C 5.9 (H) 09/29/2019   HGBA1C 5.7 (H) 10/20/2018   HGBA1C 5.6 05/31/2018   Lab Results  Component Value Date   INSULIN  10.2 08/06/2020   INSULIN  9.2 09/29/2019   INSULIN  4.8 05/31/2018   INSULIN  13.7 12/22/2017    Plan: Will minimize all refined carbohydrates both sweets and starches.  Will work on the plan and exercise.  Consider both aerobic and resistance training.  Will keep protein, water, and fiber intake  high.  Increase Polyunsaturated and Monounsaturated fats to increase satiety and encourage weight loss.  Aim for 7 to 9 hours of sleep nightly.  Will continue medications.    Generalized Obesity: Current BMI BMI (Calculated): 31.62   Christy Serrano is currently in the action stage of change. As such, her goal is to continue with weight loss efforts.  She has agreed to journaling breakfast with goal of 1,200 calories and 80 grams of protein.  Exercise goals: Older adults should determine their level of effort for physical activity relative to their level of fitness.   Behavioral modification strategies: increasing lean protein intake, no meal skipping, meal planning , increase water intake, better snacking choices, increasing vegetables, increasing fiber rich foods, keep healthy foods in the home, and mindful eating.  Christy Serrano has agreed to follow-up with our clinic in 6 months.      Objective:   VITALS: Per patient if applicable, see vitals. GENERAL: Alert and in no acute distress. CARDIOPULMONARY: No increased WOB. Speaking in clear sentences.  PSYCH: Pleasant and cooperative.  Speech normal rate and rhythm. Affect is appropriate. Insight and judgement are appropriate. Attention is focused, linear, and appropriate.  NEURO: Oriented as arrived to appointment on time with no prompting.   Attestation Statements:   This was prepared with the assistance of Engineer, civil (consulting).  Occasional wrong-word or sound-a-like substitutions may have occurred due to the inherent limitations of voice recognition   Kirk Peper, DO

## 2023-07-22 ENCOUNTER — Ambulatory Visit: Payer: Managed Care, Other (non HMO) | Admitting: Bariatrics

## 2023-08-20 ENCOUNTER — Emergency Department

## 2023-08-20 ENCOUNTER — Other Ambulatory Visit: Payer: Self-pay

## 2023-08-20 ENCOUNTER — Emergency Department
Admission: EM | Admit: 2023-08-20 | Discharge: 2023-08-20 | Disposition: A | Discharge status: Home / Self Care | Attending: Emergency Medicine | Admitting: Emergency Medicine

## 2023-08-20 DIAGNOSIS — R791 Abnormal coagulation profile: Secondary | ICD-10-CM | POA: Insufficient documentation

## 2023-08-20 DIAGNOSIS — R259 Unspecified abnormal involuntary movements: Secondary | ICD-10-CM | POA: Diagnosis not present

## 2023-08-20 DIAGNOSIS — M62838 Other muscle spasm: Secondary | ICD-10-CM | POA: Diagnosis present

## 2023-08-20 LAB — DIFFERENTIAL
Abs Immature Granulocytes: 0.01 10*3/uL (ref 0.00–0.07)
Basophils Absolute: 0 10*3/uL (ref 0.0–0.1)
Basophils Relative: 0 %
Eosinophils Absolute: 0 10*3/uL (ref 0.0–0.5)
Eosinophils Relative: 0 %
Immature Granulocytes: 0 %
Lymphocytes Relative: 27 %
Lymphs Abs: 1.7 10*3/uL (ref 0.7–4.0)
Monocytes Absolute: 0.3 10*3/uL (ref 0.1–1.0)
Monocytes Relative: 5 %
Neutro Abs: 4.4 10*3/uL (ref 1.7–7.7)
Neutrophils Relative %: 68 %

## 2023-08-20 LAB — CBC
HCT: 38.5 % (ref 36.0–46.0)
Hemoglobin: 12.7 g/dL (ref 12.0–15.0)
MCH: 30.4 pg (ref 26.0–34.0)
MCHC: 33 g/dL (ref 30.0–36.0)
MCV: 92.1 fL (ref 80.0–100.0)
Platelets: 205 10*3/uL (ref 150–400)
RBC: 4.18 MIL/uL (ref 3.87–5.11)
RDW: 13.9 % (ref 11.5–15.5)
WBC: 6.5 10*3/uL (ref 4.0–10.5)
nRBC: 0 % (ref 0.0–0.2)

## 2023-08-20 LAB — COMPREHENSIVE METABOLIC PANEL WITH GFR
ALT: 19 U/L (ref 0–44)
AST: 24 U/L (ref 15–41)
Albumin: 4.5 g/dL (ref 3.5–5.0)
Alkaline Phosphatase: 81 U/L (ref 38–126)
Anion gap: 8 (ref 5–15)
BUN: 14 mg/dL (ref 8–23)
CO2: 29 mmol/L (ref 22–32)
Calcium: 10.7 mg/dL — ABNORMAL HIGH (ref 8.9–10.3)
Chloride: 103 mmol/L (ref 98–111)
Creatinine, Ser: 1.04 mg/dL — ABNORMAL HIGH (ref 0.44–1.00)
GFR, Estimated: 59 mL/min — ABNORMAL LOW (ref >60)
Glucose, Bld: 105 mg/dL — ABNORMAL HIGH (ref 70–99)
Potassium: 4.2 mmol/L (ref 3.5–5.1)
Sodium: 140 mmol/L (ref 135–145)
Total Bilirubin: 1.2 mg/dL (ref 0.0–1.2)
Total Protein: 8 g/dL (ref 6.5–8.1)

## 2023-08-20 LAB — PROTIME-INR
INR: 1 (ref 0.8–1.2)
Prothrombin Time: 13.8 s (ref 11.4–15.2)

## 2023-08-20 LAB — APTT: aPTT: 28 s (ref 24–36)

## 2023-08-20 MED ORDER — GADOBUTROL 1 MMOL/ML IV SOLN
8.0000 mL | Freq: Once | INTRAVENOUS | Status: AC | PRN
  Administered 2023-08-20: 8 mL via INTRAVENOUS

## 2023-08-20 NOTE — ED Notes (Signed)
 Pt at MRI

## 2023-08-20 NOTE — ED Provider Notes (Signed)
 [facility] Provider Note    Event Date/Time   First MD Initiated Contact with Patient 08/20/23 1625     (approximate)   History   Spasms   HPI  Christy Serrano is a 68 y.o. female who presents to the emergency department today because of concerns for left upper and lower extremity involuntary movement.  She states that it started while at work today.  About 15 minutes prior to starting she did have a coughing fit.  The symptoms lasted about 30 minutes.  She states she had no control over the abnormal movement of her left arm and leg.  She does not have any headache or pain at this time.  It then gradually got better.  She denies similar symptoms in the past.  No new medications.  No head trauma.    Physical Exam   Triage Vital Signs: ED Triage Vitals  Encounter Vitals Group     BP 08/20/23 1505 (!) 182/71     Systolic BP Percentile --      Diastolic BP Percentile --      Pulse Rate 08/20/23 1505 65     Resp 08/20/23 1505 18     Temp 08/20/23 1505 98.1 F (36.7 C)     Temp Source 08/20/23 1505 Oral     SpO2 08/20/23 1505 98 %     Weight --      Height --      Head Circumference --      Peak Flow --      Pain Score 08/20/23 1506 0     Pain Loc --      Pain Education --      Exclude from Growth Chart --     Most recent vital signs: Vitals:   08/20/23 1505  BP: (!) 182/71  Pulse: 65  Resp: 18  Temp: 98.1 F (36.7 C)  SpO2: 98%   General: Awake, alert, oriented. CV:  Good peripheral perfusion. Regular rate and rhythm. Resp:  Normal effort. Lungs clear. Abd:  No distention.  Other:  Face symmetric. Tongue midline. PERRL. EOMI. Strength 5/5 in upper and lower extremities. Sensation grossly intact.    ED Results / Procedures / Treatments   Labs (all labs ordered are listed, but only abnormal results are displayed) Labs Reviewed  COMPREHENSIVE METABOLIC PANEL WITH GFR - Abnormal; Notable for the following components:      Result Value    Glucose, Bld 105 (*)    Creatinine, Ser 1.04 (*)    Calcium 10.7 (*)    GFR, Estimated 59 (*)    All other components within normal limits  PROTIME-INR  APTT  CBC  DIFFERENTIAL  CBG MONITORING, ED     EKG  I, Marylynn Soho, attending physician, personally viewed and interpreted this EKG  EKG Time: 1512 Rate: 63 Rhythm: normal sinus rhythm Axis: normal Intervals: qtc 431 QRS: narrow ST changes: no st elevation Impression: normal ekg   RADIOLOGY I independently interpreted and visualized the CT head. My interpretation: No ICH Radiology interpretation:  IMPRESSION:  No CT evidence of acute intracranial abnormality.    Chronic microvascular ischemic changes.   MR brain Radiology interpretation:  IMPRESSION:  1. No acute intracranial abnormality.  2. Multifocal hyperintense T2-weighted signal within the white  matter, nonspecific but most commonly seen in the setting of chronic  small vessel ischemia.     PROCEDURES:  Critical Care performed: No    MEDICATIONS ORDERED IN ED: Medications -  No data to display   IMPRESSION / MDM / ASSESSMENT AND PLAN / ED COURSE  I reviewed the triage vital signs and the nursing notes.                              Differential diagnosis includes, but is not limited to, CVA, electrolyte abnormality, neurodegenerative disease  Patient's presentation is most consistent with acute presentation with potential threat to life or bodily function.  Patient presented to the emergency department today because of concern for an episode of involuntary movement of the left upper and lower extremity. Per description it did not sound like seizure like activity. Work up with negative CT head and blood work. Did obtain MRI which did not show any acute abnormality or concerning abnormality. No further episodes while here in the emergency department. Somewhat unclear etiology of the patient's symptoms but I do think it is reasonable for  patient to be discharged at this time.    FINAL CLINICAL IMPRESSION(S) / ED DIAGNOSES   Final diagnoses:  Muscle spasm  Involuntary movements        Rx / DC Orders       Note:  This document was prepared using Dragon voice recognition software and may include unintentional dictation errors.    Marylynn Soho, MD 08/20/23 [phone]

## 2023-08-20 NOTE — ED Notes (Signed)
 Pt return from MRI

## 2023-08-20 NOTE — Discharge Instructions (Addendum)
 Please seek medical attention for any high fevers, chest pain, shortness of breath, change in behavior, persistent vomiting, bloody stool or any other new or concerning symptoms.

## 2023-08-20 NOTE — ED Triage Notes (Signed)
 Pt to ED via [facility] from work. Pt reports about 1hr PTA she was sitting at her desk and started to have as coughing spell and then started having uncontrollable jerking of her left arm and left leg. Pt reports episode lasted approx . Pt denies N/V/D, HA, vision or speech changes. Pt with hx of breast cancer.   On arrival pt reports no symptoms. Pt remains hypertensive 182/71  EMS VS: HR 64 BP 211/104 97% RA CBG 103 98.3

## 2023-10-06 ENCOUNTER — Other Ambulatory Visit (HOSPITAL_BASED_OUTPATIENT_CLINIC_OR_DEPARTMENT_OTHER): Payer: Self-pay

## 2023-10-16 ENCOUNTER — Other Ambulatory Visit (HOSPITAL_BASED_OUTPATIENT_CLINIC_OR_DEPARTMENT_OTHER): Payer: Self-pay

## 2023-11-02 ENCOUNTER — Telehealth: Payer: Self-pay | Admitting: Gastroenterology

## 2023-11-02 ENCOUNTER — Other Ambulatory Visit (HOSPITAL_BASED_OUTPATIENT_CLINIC_OR_DEPARTMENT_OTHER): Payer: Self-pay

## 2023-11-02 NOTE — Telephone Encounter (Signed)
 Patient OBGYN called and stated that this patient is needing a refill on her Veozah  45 MG tabs. And they sent over a request over to us . OBGYN Office is requesting that we return there call at [phone]. Please advise.

## 2023-11-03 ENCOUNTER — Other Ambulatory Visit (HOSPITAL_BASED_OUTPATIENT_CLINIC_OR_DEPARTMENT_OTHER): Payer: Self-pay

## 2023-11-03 ENCOUNTER — Other Ambulatory Visit (HOSPITAL_COMMUNITY): Payer: Self-pay

## 2023-11-03 MED ORDER — VEOZAH 45 MG PO TABS
45.0000 mg | ORAL_TABLET | Freq: Every day | ORAL | 2 refills | Status: DC
  Filled 2023-11-03 – 2023-11-17 (×2): qty 30, 30d supply, fill #0
  Filled 2023-12-09 – 2023-12-10 (×2): qty 30, 30d supply, fill #1

## 2023-11-03 NOTE — Telephone Encounter (Signed)
 Called and spoke to nurse to inform her they called the wrong office as we are a GI practice and don't prescribe that medication.

## 2023-11-04 ENCOUNTER — Other Ambulatory Visit (HOSPITAL_BASED_OUTPATIENT_CLINIC_OR_DEPARTMENT_OTHER): Payer: Self-pay

## 2023-11-05 ENCOUNTER — Other Ambulatory Visit (HOSPITAL_BASED_OUTPATIENT_CLINIC_OR_DEPARTMENT_OTHER): Payer: Self-pay

## 2023-11-11 DIAGNOSIS — K080 Exfoliation of teeth due to systemic causes: Secondary | ICD-10-CM | POA: Diagnosis not present

## 2023-11-17 ENCOUNTER — Other Ambulatory Visit (HOSPITAL_BASED_OUTPATIENT_CLINIC_OR_DEPARTMENT_OTHER): Payer: Self-pay

## 2023-12-02 DIAGNOSIS — N951 Menopausal and female climacteric states: Secondary | ICD-10-CM | POA: Diagnosis not present

## 2023-12-07 ENCOUNTER — Other Ambulatory Visit (HOSPITAL_BASED_OUTPATIENT_CLINIC_OR_DEPARTMENT_OTHER): Payer: Self-pay

## 2023-12-07 MED ORDER — FLUZONE HIGH-DOSE 0.5 ML IM SUSY
0.5000 mL | PREFILLED_SYRINGE | Freq: Once | INTRAMUSCULAR | 0 refills | Status: AC
Start: 2023-12-07 — End: 2023-12-08
  Filled 2023-12-07: qty 0.5, 1d supply, fill #0

## 2023-12-07 MED ORDER — COMIRNATY 30 MCG/0.3ML IM SUSY
0.3000 mL | PREFILLED_SYRINGE | Freq: Once | INTRAMUSCULAR | 0 refills | Status: AC
  Filled 2023-12-07: qty 0.3, 1d supply, fill #0

## 2023-12-09 ENCOUNTER — Other Ambulatory Visit (HOSPITAL_BASED_OUTPATIENT_CLINIC_OR_DEPARTMENT_OTHER): Payer: Self-pay

## 2023-12-10 ENCOUNTER — Other Ambulatory Visit (HOSPITAL_BASED_OUTPATIENT_CLINIC_OR_DEPARTMENT_OTHER): Payer: Self-pay

## 2023-12-14 ENCOUNTER — Other Ambulatory Visit (HOSPITAL_BASED_OUTPATIENT_CLINIC_OR_DEPARTMENT_OTHER): Payer: Self-pay

## 2023-12-22 ENCOUNTER — Other Ambulatory Visit: Payer: Self-pay | Admitting: Internal Medicine

## 2023-12-22 DIAGNOSIS — Z1231 Encounter for screening mammogram for malignant neoplasm of breast: Secondary | ICD-10-CM

## 2024-01-06 DIAGNOSIS — R7303 Prediabetes: Secondary | ICD-10-CM | POA: Diagnosis not present

## 2024-01-06 DIAGNOSIS — Z1389 Encounter for screening for other disorder: Secondary | ICD-10-CM | POA: Diagnosis not present

## 2024-01-06 LAB — HEMOGLOBIN A1C: Hemoglobin A1C: 5.9

## 2024-01-06 LAB — BASIC METABOLIC PANEL WITH GFR
BUN: 11 (ref 4–21)
CO2: 28 — AB (ref 13–22)
Chloride: 105 (ref 99–108)
Creatinine: 1 (ref 0.5–1.1)
Glucose: 84
Potassium: 4.2 meq/L (ref 3.5–5.1)
Sodium: 140 (ref 137–147)

## 2024-01-06 LAB — HEPATIC FUNCTION PANEL
ALT: 12 U/L (ref 7–35)
AST: 17 (ref 13–35)
Alkaline Phosphatase: 74 (ref 25–125)
Bilirubin, Total: 0.6

## 2024-01-06 LAB — LIPID PANEL
Cholesterol: 165 (ref 0–200)
HDL: 71 — AB (ref 35–70)
LDL Cholesterol: 76
LDl/HDL Ratio: 1.1
Triglycerides: 90 (ref 40–160)

## 2024-01-06 LAB — COMPREHENSIVE METABOLIC PANEL WITH GFR
Albumin: 4.2 (ref 3.5–5.0)
Calcium: 8.6 — AB (ref 8.7–10.7)
eGFR: 67.1

## 2024-01-06 LAB — CBC AND DIFFERENTIAL
HCT: 35 — AB (ref 36–46)
Hemoglobin: 11.8 — AB (ref 12.0–16.0)
Platelets: 164 K/uL (ref 150–400)
WBC: 4.2

## 2024-01-06 LAB — CBC: RBC: 3.9 (ref 3.87–5.11)

## 2024-01-07 LAB — TSH: TSH: 1.99

## 2024-01-13 DIAGNOSIS — Z23 Encounter for immunization: Secondary | ICD-10-CM | POA: Diagnosis not present

## 2024-01-13 DIAGNOSIS — C50412 Malignant neoplasm of upper-outer quadrant of left female breast: Secondary | ICD-10-CM | POA: Diagnosis not present

## 2024-01-13 DIAGNOSIS — Z0000 Encounter for general adult medical examination without abnormal findings: Secondary | ICD-10-CM | POA: Diagnosis not present

## 2024-01-13 DIAGNOSIS — R82998 Other abnormal findings in urine: Secondary | ICD-10-CM | POA: Diagnosis not present

## 2024-01-14 ENCOUNTER — Ambulatory Visit (INDEPENDENT_AMBULATORY_CARE_PROVIDER_SITE_OTHER): Payer: Self-pay | Admitting: Bariatrics

## 2024-01-14 ENCOUNTER — Encounter: Payer: Self-pay | Admitting: Bariatrics

## 2024-01-14 VITALS — BP 139/76 | HR 73 | Temp 98.5°F | Ht 65.0 in | Wt 188.0 lb

## 2024-01-14 DIAGNOSIS — R7303 Prediabetes: Secondary | ICD-10-CM

## 2024-01-14 DIAGNOSIS — E669 Obesity, unspecified: Secondary | ICD-10-CM | POA: Diagnosis not present

## 2024-01-14 DIAGNOSIS — Z6831 Body mass index (BMI) 31.0-31.9, adult: Secondary | ICD-10-CM

## 2024-01-14 DIAGNOSIS — E6609 Other obesity due to excess calories: Secondary | ICD-10-CM

## 2024-01-14 NOTE — Progress Notes (Signed)
 WEIGHT SUMMARY AND BIOMETRICS  Weight Lost Since Last Visit: 2lb  Weight Gained Since Last Visit: 0   Vitals Temp: 98.5 F (36.9 C) BP: 139/76 Pulse Rate: 73 SpO2: 99 %   Anthropometric Measurements Height: 5' 5 (1.651 m) Weight: 188 lb (85.3 kg) BMI (Calculated): 31.28 Weight at Last Visit: 190lb Weight Lost Since Last Visit: 2lb Weight Gained Since Last Visit: 0 Starting Weight: 195lb Total Weight Loss (lbs): 7 lb (3.175 kg)   Body Composition  Body Fat %: 41.5 % Fat Mass (lbs): 78.4 lbs Muscle Mass (lbs): 104.8 lbs Total Body Water (lbs): 77.4 lbs Visceral Fat Rating : 12   Other Clinical Data Fasting: no Labs: no Today's Visit #: 21 Starting Date: 12/22/17    OBESITY Christy Serrano is here to discuss her progress with her obesity treatment plan along with follow-up of her obesity related diagnoses.    Nutrition Plan: the Category 3 plan - 50% adherence.  Current exercise: walking and yard work  Interim History:  She is down 2 lbs since her last visit.  Eating all of the food on the plan., Protein intake is as prescribed, Not journaling consistently., and Water intake is adequate.  Hunger is moderately controlled.  Cravings are moderately controlled.  Assessment/Plan:   Prediabetes Last A1c was 6.3  Medication(s): none Lab Results  Component Value Date   HGBA1C 6.3 10/31/2021   HGBA1C 6.1 (H) 08/06/2020   HGBA1C 5.9 (H) 09/29/2019   HGBA1C 5.7 (H) 10/20/2018   HGBA1C 5.6 05/31/2018   Lab Results  Component Value Date   INSULIN  10.2 08/06/2020   INSULIN  9.2 09/29/2019   INSULIN  4.8 05/31/2018   INSULIN  13.7 12/22/2017    Plan: Will minimize all refined carbohydrates both sweets and starches.  Will work on the plan and exercise.  Consider both aerobic and resistance training.  Will keep protein, water, and fiber intake high.   Increase Polyunsaturated and Monounsaturated fats to increase satiety and encourage weight loss.     Generalized Obesity: Current BMI BMI (Calculated): 31.28   Pharmacotherapy Plan No anti-obesity medications.   Christy Serrano is currently in the action stage of change. As such, her goal is to continue with weight loss efforts.  She has agreed to the Category 3 plan.  Exercise goals: Older adults should determine their level of effort for physical activity relative to their level of fitness.   Behavioral modification strategies: increasing lean protein intake, no meal skipping, meal planning , increase water intake, better snacking choices, planning for success, increasing vegetables, avoiding temptations, weigh protein portions, and measure portion sizes.  Christy Serrano has completed her visits here secondary to other time constraints and will follow-up with her PCP.      Objective:   VITALS: Per patient if applicable, see vitals. GENERAL: Alert and in no acute distress. CARDIOPULMONARY: No increased WOB. Speaking in clear sentences.  PSYCH: Pleasant and cooperative. Speech normal rate and rhythm. Affect is appropriate. Insight and judgement are appropriate. Attention is focused, linear, and appropriate.  NEURO: Oriented as arrived to appointment on time with no prompting.   Attestation Statements:   This was prepared with the assistance of Engineer, civil (consulting).  Occasional wrong-word or sound-a-like substitutions may have occurred due to the inherent limitations of voice recognition.  Clayborne Daring, DO

## 2024-01-18 ENCOUNTER — Other Ambulatory Visit: Payer: Self-pay | Admitting: Internal Medicine

## 2024-01-18 ENCOUNTER — Other Ambulatory Visit (HOSPITAL_BASED_OUTPATIENT_CLINIC_OR_DEPARTMENT_OTHER): Payer: Self-pay

## 2024-01-19 ENCOUNTER — Other Ambulatory Visit (HOSPITAL_BASED_OUTPATIENT_CLINIC_OR_DEPARTMENT_OTHER): Payer: Self-pay

## 2024-01-19 MED ORDER — VEOZAH 45 MG PO TABS
45.0000 mg | ORAL_TABLET | Freq: Every day | ORAL | 6 refills | Status: AC
  Filled 2024-01-19: qty 30, 30d supply, fill #0
  Filled 2024-02-13: qty 30, 30d supply, fill #1
  Filled 2024-03-14: qty 30, 30d supply, fill #2
  Filled 2024-04-14: qty 30, 30d supply, fill #3

## 2024-01-22 ENCOUNTER — Ambulatory Visit
Admission: RE | Admit: 2024-01-22 | Discharge: 2024-01-22 | Disposition: A | Discharge status: Home / Self Care | Source: Ambulatory Visit | Attending: Internal Medicine | Admitting: Internal Medicine

## 2024-01-22 DIAGNOSIS — Z1231 Encounter for screening mammogram for malignant neoplasm of breast: Secondary | ICD-10-CM

## 2024-01-25 ENCOUNTER — Telehealth: Payer: Self-pay | Admitting: Adult Health

## 2024-01-25 NOTE — Telephone Encounter (Signed)
 left vm for pt about rescheduled appt date and time. Encouraged to call back if need to re-reschedule

## 2024-02-15 ENCOUNTER — Other Ambulatory Visit (HOSPITAL_BASED_OUTPATIENT_CLINIC_OR_DEPARTMENT_OTHER): Payer: Self-pay

## 2024-02-17 ENCOUNTER — Ambulatory Visit: Payer: Managed Care, Other (non HMO) | Admitting: Adult Health

## 2024-02-23 ENCOUNTER — Inpatient Hospital Stay: Payer: Self-pay | Attending: Adult Health | Admitting: Adult Health

## 2024-02-23 ENCOUNTER — Inpatient Hospital Stay

## 2024-02-23 ENCOUNTER — Encounter: Payer: Self-pay | Admitting: Adult Health

## 2024-02-23 VITALS — BP 142/57 | HR 66 | Temp 97.6°F | Resp 18 | Wt 192.4 lb

## 2024-02-23 DIAGNOSIS — Z171 Estrogen receptor negative status [ER-]: Secondary | ICD-10-CM

## 2024-02-23 DIAGNOSIS — Z08 Encounter for follow-up examination after completed treatment for malignant neoplasm: Secondary | ICD-10-CM | POA: Diagnosis present

## 2024-02-23 DIAGNOSIS — Z853 Personal history of malignant neoplasm of breast: Secondary | ICD-10-CM | POA: Insufficient documentation

## 2024-02-23 DIAGNOSIS — C50412 Malignant neoplasm of upper-outer quadrant of left female breast: Secondary | ICD-10-CM

## 2024-02-23 NOTE — Progress Notes (Unsigned)
 [facility] Cancer Follow up:    Christy Hamilton, MD [address] [place] [address]   DIAGNOSIS: Cancer Staging  Carcinoma of upper-outer quadrant of left breast in female, estrogen receptor negative (HCC) Staging form: Breast, AJCC 8th Edition - Pathologic: ypT0, ypN0 - Signed by Izell Domino, MD on 08/11/2018 Stage prefix: Post-therapy Neoadjuvant therapy: Yes - Clinical: Stage IIIA (cT3, cN1, cM0, G3, ER-, PR-, HER2+) - Signed by Izell Domino, MD on 08/11/2018 Histologic grading system: 3 grade system  Malignant neoplasm of left female breast [facility]) Staging form: Breast, AJCC 8th Edition - Clinical: Stage IIIA (cT3, cN1, cM0, G3, ER-, PR-, HER2+) - Signed by Odean Potts, MD on 02/03/2018 Neoadjuvant therapy: No Histologic grading system: 3 grade system Laterality: Left Tumor size (mm): 54    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 breast (HCC)  01/18/2018 Initial Diagnosis   Screening mammogram detected microcalcifications and asymmetry, left breast segmental microcalcifications UOQ 1.5 x 4.3 x 5.4 cm biopsy-proven IDC with DCIS with LV I, grade 3, ER 0%, PR 0%, Ki-67 30%, HER-2 +3+ by IHC; hypoechoic mass 2 o'clock position 1.2 x 1.3 x 1.7 cm biopsy IDC, LV I present, grade 2-3, ER 0%, PR 0%, Ki-67 40%, HER-2 +3+, left axillary lymph node positive (3 nodes noted by US ) T3 N1 stage IIIA   02/03/2018 Cancer Staging   Staging form: Breast, AJCC 8th Edition - Clinical: Stage IIIA (cT3, cN1, cM0, G3, ER-, PR-, HER2+) - Signed by Odean Potts, MD on 02/03/2018   02/20/2018 Genetic Testing   No pathogenic variants identified on the [facility] Expanded + RNA insight panel. Genes Analyzed (67 total): AIP, ALK, APC*, ATM*, BAP1, BARD1, BLM, BMPR1A, BRCA1*, BRCA2*, BRIP1*, CDH1*, CDK4, CDKN1B, CDKN2A, CHEK2*, DICER1, FANCC, FH, FLCN, GALNT12, HOXB13, MAX, MEN1, MET, MLH1*, MRE11A, MSH2*, MSH6*, MUTYH*, NBN, NF1*,  NF2, PALB2*, PHOX2B, PMS2*, POLD1, POLE, POT1, PRKAR1A, PTCH1, PTEN*, RAD50, RAD51C*, RAD51D*, RB1, RET, SDHA, SDHAF2, SDHB, SDHC, SDHD, SMAD4, SMARCA4, SMARCB1, SMARCE1, STK11, SUFU, TMEM127, TP53*, TSC1, TSC2, VHL and XRCC2 (sequencing and deletion/duplication); MITF (sequencing only); EPCAM and GREM1 (deletion/duplication only). DNA and RNA analyses performed for * genes. The report date is 02/20/2018.   03/04/2018 -  Neo-Adjuvant Chemotherapy   Neoadjuvant chemotherapy with [facility] Perjeta    03/08/2018 - 03/10/2018 Hospital Admission   Admitted for recurrent syncope with loss of consciousness for up to 5 minutes with loss of bladder, no abnormalities identified.  MRI brain, carotid ultrasound, EEG studies were negative.   06/17/2018 Breast MRI   No suspicious residual enhancement identified in the left breast, compatible with treatment response, interval decrease in size of left axillary lymph nodes   07/12/2018 Surgery   Lumpectomy (Dr. Curvin): complete treatment response, no residual carcinoma present in breast or 11/11 lymph nodes.    09/02/2018 - 10/14/2018 Radiation Therapy   Adjuvant XRT   Carcinoma of upper-outer quadrant of left breast in female, estrogen receptor negative (HCC)  07/12/2018 Initial Diagnosis   Carcinoma of upper-outer quadrant of left breast in female, estrogen receptor negative (HCC)   08/11/2018 Cancer Staging   Staging form: Breast, AJCC 8th Edition - Pathologic: No Stage Recommended (ypT0, pN0) - Signed by Izell Domino, MD on 08/11/2018   08/11/2018 Cancer Staging   Staging form: Breast, AJCC 8th Edition - Clinical: Stage IIIA (cT3, cN1, cM0, G3, ER-, PR-, HER2+) - Signed by Izell Domino, MD on 08/11/2018   08/20/2018 - 02/25/2019 Chemotherapy   Patient is on  Treatment Plan : BREAST Herceptin  / Pertuzumab  (Perjeta ) q21d       CURRENT THERAPY: observation  INTERVAL HISTORY:  Discussed the use of AI scribe software for clinical note transcription with the  patient, who gave verbal consent to proceed.  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 She is on observation only. Her most recent mammogram was on January 22, 2024.  Hot flashes are controlled with Gioza. She has bursitis-related difficulty walking that has improved, with intermittent hip twinges and lower back stiffness managed with stretching.  She has stable residual numbness in both feet from the ball to the toes without tingling, and occasional tightness in the prior treatment area that improves with stretching. Prior shooting pains have resolved.  She denies cough, shortness of breath, chest pain, palpitations, headaches, or visual changes. She follows a healthier diet and avoids processed foods.     Patient Active Problem List   Diagnosis Date Noted   Insulin  resistance 04/03/2022   Vitamin D  deficiency 10/28/2021   Carcinoma of upper-outer quadrant of left breast in female, estrogen receptor negative (HCC) 07/12/2018   Seizure-like activity (HCC)    Hyponatremia    Recurrent syncope 03/08/2018   LFT elevation    Seizure (HCC)    Thrombocytopenia    Port-A-Cath in place 03/04/2018   Genetic testing 02/23/2018   Family history of breast cancer    Family history of prostate cancer    Family history of ovarian cancer    Malignant neoplasm of left female breast (HCC) 02/03/2018   Prediabetes 01/06/2018   Reactive airway disease without complication 12/22/2017   Class 1 obesity with serious comorbidity and body mass index (BMI) of 32.0 to 32.9 in adult 12/22/2017    is allergic to gabapentin , amoxicillin, oxycodone, and  penicillin g.  MEDICAL HISTORY: Past Medical History:  Diagnosis Date   Arthritis    left hip   Breast cancer (HCC) 2020   Left Breast Cancer   Cancer (HCC)    left breast   Chronic bronchitis (HCC)    Family history of breast cancer    Family history of ovarian cancer    Family history of prostate cancer    History of radiation therapy 09/02/18- 10/14/18   Left Breast 25 fx for a total of 50 Gy, Left SCV, PA 25 fractions for a total of 50 Gy, Left Breast boost 5 fractions, for a total of 10 Gy.    Lactose intolerance    Personal history of chemotherapy 2020   Left Breast Cancer   Personal history of radiation therapy 2020   Left Breast Cancer   PONV (postoperative nausea and vomiting)     SURGICAL HISTORY: Past Surgical History:  Procedure Laterality Date   BREAST LUMPECTOMY Left 07/12/2018   BREAST LUMPECTOMY WITH RADIOACTIVE SEED AND AXILLARY LYMPH NODE DISSECTION Left 07/12/2018   Procedure: LEFT BREAST RADIOACTIVE SEED X 3 LUMPECTOMY X 3  AND AXILLARY LYMPH NODE DISSECTION;  Surgeon: Curvin Deward MOULD, MD;  Location: [facility];  Service: General;  Laterality: Left;   COLONOSCOPY  2009   PORT-A-CATH REMOVAL Right 04/01/2019   Procedure: REMOVAL PORT-A-CATH;  Surgeon: Curvin Deward MOULD, MD;  Location: [facility];  Service: General;  Laterality: Right;   PORTACATH PLACEMENT Right 03/03/2018   Procedure: INSERTION PORT-A-CATH;  Surgeon: Curvin Deward MOULD, MD;  Location: [facility];  Service: General;  Laterality: Right;   ROTATOR CUFF REPAIR Right 2006   TOTAL ABDOMINAL HYSTERECTOMY  2010    SOCIAL HISTORY: Social History   Socioeconomic History   Marital status: Single    Spouse name: Not on file   Number of children: 0   Years of education: 13   Highest education level: Not on file  Occupational History   Occupation: lab tech    Comment: [employer]  Tobacco Use   Smoking status: Never   Smokeless tobacco: Never  Vaping Use    Vaping status: Never Used  Substance and Sexual Activity   Alcohol use: No    Comment: quit long ago   Drug use: No   Sexual activity: Not on file  Other Topics Concern   Not on file  Social History Narrative   Lives with sister   Caffeine- rarely   Social Drivers of Health   Financial Resource Strain: Low Risk  (02/23/2024)   Overall Financial Resource Strain (CARDIA)    Difficulty of Paying Living Expenses: Not hard at all  Food Insecurity: No Food Insecurity (02/23/2024)   Hunger Vital Sign    Worried About Running Out of Food in the Last Year: Never true    Ran Out of Food in the Last Year: Never true  Transportation Needs: No Transportation Needs (02/23/2024)   PRAPARE - Administrator, Civil Service (Medical): No    Lack of Transportation (Non-Medical): No  Physical Activity: Insufficiently Active (02/23/2024)   Exercise Vital Sign    Days of Exercise per Week: 2 days    Minutes of Exercise per Session: 30 min  Stress: No Stress Concern Present (02/23/2024)   [employer] of Occupational Health - Occupational Stress Questionnaire    Feeling of Stress: Not at all  Social Connections: Moderately Isolated (02/23/2024)   Social Connection and Isolation Panel    Frequency of Communication with Friends and Family: More than three times a week    Frequency of Social Gatherings with Friends and Family: More than three times a week    Attends Religious Services: More than 4 times per year    Active Member of [employer] or Organizations: No    Attends Banker Meetings: Never    Marital Status: Never married  Intimate Partner Violence: Not At Risk (02/23/2024)   Humiliation, Afraid, Rape, and Kick questionnaire    Fear of Current or Ex-Partner: No    Emotionally Abused: No    Physically Abused: No    Sexually Abused: No    FAMILY HISTORY: Family History  Problem Relation Age of Onset   Breast cancer Mother 63   High blood pressure Mother    Heart  attack Mother        CHF   High blood pressure Father    Prostate cancer Father        dx mid 55s   Breast cancer Sister 23       negative Invitae 46 gene panel   Breast cancer Sister 76       negative 21 gene panel   Breast cancer Paternal Aunt        dx mid 81s   Cancer Paternal Uncle        jaw cancer d. 86s   Ovarian  cancer Maternal Grandmother        d. 28s   Cancer Paternal Grandmother        either stomach or ovarian cancer   Colon cancer Neg Hx    Colon polyps Neg Hx    Esophageal cancer Neg Hx    Rectal cancer Neg Hx    Stomach cancer Neg Hx     Review of Systems  Constitutional:  Negative for appetite change, chills, fatigue, fever and unexpected weight change.  HENT:   Negative for hearing loss, lump/mass and trouble swallowing.   Eyes:  Negative for eye problems and icterus.  Respiratory:  Negative for chest tightness, cough and shortness of breath.   Cardiovascular:  Negative for chest pain, leg swelling and palpitations.  Gastrointestinal:  Negative for abdominal distention, abdominal pain, constipation, diarrhea, nausea and vomiting.  Endocrine: Negative for hot flashes.  Genitourinary:  Negative for difficulty urinating.   Musculoskeletal:  Negative for arthralgias.  Skin:  Negative for itching and rash.  Neurological:  Negative for dizziness, extremity weakness, headaches and numbness.  Hematological:  Negative for adenopathy. Does not bruise/bleed easily.  Psychiatric/Behavioral:  Negative for depression. The patient is not nervous/anxious.       PHYSICAL EXAMINATION   Onc Performance Status - 02/23/24 1039       ECOG Perf Status   ECOG Perf Status Restricted in physically strenuous activity but ambulatory and able to carry out work of a light or sedentary nature, e.g., light house work, office work      KPS SCALE   KPS % SCORE Able to carry on normal activity, minor s/s of disease          Vitals:   02/23/24 1035  BP: (!) 142/57  Pulse:  66  Resp: 18  Temp: 97.6 F (36.4 C)  SpO2: 99%    Physical Exam Constitutional:      General: She is not in acute distress.    Appearance: Normal appearance. She is not toxic-appearing.  HENT:     Head: Normocephalic and atraumatic.     Mouth/Throat:     Mouth: Mucous membranes are moist.     Pharynx: Oropharynx is clear. No oropharyngeal exudate or posterior oropharyngeal erythema.  Eyes:     General: No scleral icterus. Cardiovascular:     Rate and Rhythm: Normal rate and regular rhythm.     Pulses: Normal pulses.     Heart sounds: Normal heart sounds.  Pulmonary:     Effort: Pulmonary effort is normal.     Breath sounds: Normal breath sounds.  Chest:     Comments: Left breast s/p lumpectomy and radiation, no sign of local recurrence; right breast benign Abdominal:     General: Abdomen is flat. Bowel sounds are normal. There is no distension.     Palpations: Abdomen is soft.     Tenderness: There is no abdominal tenderness.  Musculoskeletal:        General: No swelling.     Cervical back: Neck supple.  Lymphadenopathy:     Cervical: No cervical adenopathy.     Upper Body:     Right upper body: No supraclavicular or axillary adenopathy.     Left upper body: No supraclavicular or axillary adenopathy.  Skin:    General: Skin is warm and dry.     Findings: No rash.  Neurological:     General: No focal deficit present.     Mental Status: She is alert.  Psychiatric:  Mood and Affect: Mood normal.        Behavior: Behavior normal.     LABORATORY DATA:  CBC    Component Value Date/Time   WBC 4.2 01/06/2024 0000   WBC 6.5 08/20/2023 1455   RBC 3.9 01/06/2024 0000   HGB 11.8 (A) 01/06/2024 0000   HGB 11.8 (L) 05/24/2019 1452   HGB 12.3 12/22/2017 1235   HCT 35 (A) 01/06/2024 0000   HCT 38.2 12/22/2017 1235   PLT 164 01/06/2024 0000   PLT 193 05/24/2019 1452   MCV 92.1 08/20/2023 1455   MCV 92 12/22/2017 1235   MCH 30.4 08/20/2023 1455   MCHC 33.0  08/20/2023 1455   RDW 13.9 08/20/2023 1455   RDW 14.8 12/22/2017 1235   LYMPHSABS 1.7 08/20/2023 1455   LYMPHSABS 2.0 12/22/2017 1235   MONOABS 0.3 08/20/2023 1455   EOSABS 0.0 08/20/2023 1455   EOSABS 0.0 12/22/2017 1235   BASOSABS 0.0 08/20/2023 1455   BASOSABS 0.0 12/22/2017 1235    CMP     Component Value Date/Time   NA 140 01/06/2024 0000   K 4.2 01/06/2024 0000   CL 105 01/06/2024 0000   CO2 28 (A) 01/06/2024 0000   GLUCOSE 105 (H) 08/20/2023 1455   BUN 11 01/06/2024 0000   CREATININE 1.0 01/06/2024 0000   CREATININE 1.04 (H) 08/20/2023 1455   CREATININE 1.19 (H) 05/24/2019 1452   CALCIUM 8.6 (A) 01/06/2024 0000   PROT 8.0 08/20/2023 1455   PROT 7.2 08/06/2020 0853   ALBUMIN 4.2 01/06/2024 0000   ALBUMIN 4.5 08/06/2020 0853   AST 17 01/06/2024 0000   AST 17 05/24/2019 1452   ALT 12 01/06/2024 0000   ALT 12 05/24/2019 1452   ALKPHOS 74 01/06/2024 0000   BILITOT 1.2 08/20/2023 1455   BILITOT 0.6 08/06/2020 0853   BILITOT 0.6 05/24/2019 1452   GFRNONAA 59 (L) 08/20/2023 1455   GFRNONAA 49 (L) 05/24/2019 1452   GFRAA 67.1 01/06/2024 0000     ASSESSMENT and THERAPY PLAN:   No problem-specific Assessment & Plan notes found for this encounter.   Assessment and Plan Assessment & Plan Stage IIIA left breast invasive ductal carcinoma, ER/PR negative, HER2 positive, status post neoadjuvant chemotherapy, lumpectomy, adjuvant radiation, and maintenance therapy, currently in remission Currently in remission with no mammographic evidence of malignancy. Discussed Guardant Reveal blood test for early detection of recurrence. She agreed to the test for reassurance and early detection. - Ordered Guardant Reveal blood test. - Schedule follow-up lab test in six months. - Schedule annual follow-up visit with lab test prior.      All questions were answered. The patient knows to call the clinic with any problems, questions or concerns. We can certainly see the patient much  sooner if necessary.  Total encounter time:*** minutes*in face-to-face visit time, chart review, lab review, care coordination, order entry, and documentation of the encounter time.    Morna Kendall, NP 02/23/24 10:42 AM Medical Oncology and Hematology [facility] [address] [place], [place] [address] Tel. [phone]    Fax. [phone]  *Total Encounter Time as defined by the Centers for Medicare and Medicaid Services includes, in addition to the face-to-face time of a patient visit (documented in the note above) non-face-to-face time: obtaining and reviewing outside history, ordering and reviewing medications, tests or procedures, care coordination (communications with other health care professionals or caregivers) and documentation in the medical record.

## 2024-02-24 ENCOUNTER — Telehealth: Payer: Self-pay | Admitting: Adult Health

## 2024-02-24 NOTE — Telephone Encounter (Signed)
 spoke with pt about scheduling f/u appts per 12/2 los. Patient stated she will call back to schedule.

## 2024-02-29 ENCOUNTER — Other Ambulatory Visit (HOSPITAL_BASED_OUTPATIENT_CLINIC_OR_DEPARTMENT_OTHER): Payer: Self-pay

## 2024-02-29 ENCOUNTER — Other Ambulatory Visit (HOSPITAL_COMMUNITY): Payer: Self-pay

## 2024-02-29 DIAGNOSIS — C50912 Malignant neoplasm of unspecified site of left female breast: Secondary | ICD-10-CM | POA: Diagnosis not present

## 2024-02-29 DIAGNOSIS — C50412 Malignant neoplasm of upper-outer quadrant of left female breast: Secondary | ICD-10-CM | POA: Diagnosis not present

## 2024-03-01 ENCOUNTER — Encounter: Payer: Self-pay | Admitting: Adult Health

## 2024-03-01 LAB — GUARDANT REVEAL

## 2024-03-02 ENCOUNTER — Other Ambulatory Visit (HOSPITAL_BASED_OUTPATIENT_CLINIC_OR_DEPARTMENT_OTHER): Payer: Self-pay

## 2024-03-14 ENCOUNTER — Other Ambulatory Visit (HOSPITAL_BASED_OUTPATIENT_CLINIC_OR_DEPARTMENT_OTHER): Payer: Self-pay

## 2024-04-14 ENCOUNTER — Other Ambulatory Visit (HOSPITAL_BASED_OUTPATIENT_CLINIC_OR_DEPARTMENT_OTHER): Payer: Self-pay
